# Patient Record
Sex: Female | Born: 1960
Health system: Southern US, Community
[De-identification: ages and names within clinical notes are randomized; demographics above are authoritative.]

## PROBLEM LIST (undated history)

## (undated) DIAGNOSIS — K219 Gastro-esophageal reflux disease without esophagitis: Secondary | ICD-10-CM

## (undated) DIAGNOSIS — T7840XA Allergy, unspecified, initial encounter: Secondary | ICD-10-CM

## (undated) DIAGNOSIS — G5603 Carpal tunnel syndrome, bilateral upper limbs: Secondary | ICD-10-CM

## (undated) DIAGNOSIS — E039 Hypothyroidism, unspecified: Secondary | ICD-10-CM

## (undated) DIAGNOSIS — M8580 Other specified disorders of bone density and structure, unspecified site: Secondary | ICD-10-CM

## (undated) DIAGNOSIS — M1990 Unspecified osteoarthritis, unspecified site: Secondary | ICD-10-CM

## (undated) DIAGNOSIS — E079 Disorder of thyroid, unspecified: Secondary | ICD-10-CM

## (undated) DIAGNOSIS — E7800 Pure hypercholesterolemia, unspecified: Secondary | ICD-10-CM

## (undated) HISTORY — PX: FOOT SURGERY: SHX648

## (undated) HISTORY — PX: HYSTERECTOMY ABDOMINAL WITH SALPINGECTOMY: SHX6725

## (undated) HISTORY — PX: TUBAL LIGATION: SHX77

## (undated) HISTORY — PX: NASAL SINUS SURGERY: SHX719

## (undated) HISTORY — DX: Disorder of thyroid, unspecified: E07.9

## (undated) HISTORY — PX: OTHER SURGICAL HISTORY: SHX169

## (undated) HISTORY — PX: ABDOMINAL HYSTERECTOMY: SHX81

## (undated) HISTORY — PX: JOINT REPLACEMENT: SHX530

## (undated) HISTORY — PX: CHOLECYSTECTOMY: SHX55

## (undated) HISTORY — DX: Allergy, unspecified, initial encounter: T78.40XA

## (undated) HISTORY — DX: Pure hypercholesterolemia, unspecified: E78.00

---
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
## 2009-05-11 HISTORY — PX: COLONOSCOPY: SHX174

## 2016-01-20 MED FILL — LEVOTHYROXINE 75 MCG TABLET: 75 | 90 days supply | Qty: 90 | Fill #0

## 2016-02-04 MED FILL — VENLAFAXINE HCL ER 75 MG CA: 75 | 30 days supply | Qty: 30 | Fill #0

## 2016-02-27 ENCOUNTER — Ambulatory Visit (INDEPENDENT_AMBULATORY_CARE_PROVIDER_SITE_OTHER): Payer: 59 | Admitting: Orthopaedic Surgery

## 2016-02-27 DIAGNOSIS — G5601 Carpal tunnel syndrome, right upper limb: Secondary | ICD-10-CM | POA: Diagnosis not present

## 2016-02-27 DIAGNOSIS — G5602 Carpal tunnel syndrome, left upper limb: Secondary | ICD-10-CM

## 2016-02-27 MED FILL — CELECOXIB 200 MG CAPSULE: 200 | 30 days supply | Qty: 60 | Fill #0

## 2016-02-27 MED FILL — DICLOFENAC SODIUM 1% GEL: 1 | 10 days supply | Qty: 100 | Fill #0

## 2016-03-12 ENCOUNTER — Ambulatory Visit (INDEPENDENT_AMBULATORY_CARE_PROVIDER_SITE_OTHER): Payer: 59 | Admitting: Physical Medicine and Rehabilitation

## 2016-03-12 ENCOUNTER — Encounter (INDEPENDENT_AMBULATORY_CARE_PROVIDER_SITE_OTHER): Payer: Self-pay | Admitting: Physical Medicine and Rehabilitation

## 2016-03-12 DIAGNOSIS — R200 Anesthesia of skin: Secondary | ICD-10-CM

## 2016-03-12 DIAGNOSIS — R202 Paresthesia of skin: Secondary | ICD-10-CM | POA: Diagnosis not present

## 2016-03-12 NOTE — Progress Notes (Signed)
Office Visit Note  Patient: Alisha Reed           Date of Birth: February 08, 1961           MRN: [id number] Visit Date: 03/12/2016              Requested by: No referring provider defined for this encounter. PCP: No PCP Per Patient   Assessment & Plan: Visit Diagnoses:  1. Numbness and tingling in both hands   2. Paresthesia of skin    Chronic worsening hand pain with numbness and tingling in the first 3 digits right more than left. She classically has carpal tunnel syndrome symptoms. Now electrodiagnostic study does show moderate median neuropathy at the wrist on the right as well as mild on the left. She should continue with wrist splints particularly at night. She may wish to consider carpal tunnel injection by Dr. Wyline Copas but I would leave that up to his discretion. She really does not necessarily need surgery at this point although she could consider surgery on the right and it would be definitive.  Follow-Up Instructions: Return for Follow up Dr. Erlinda Hong for review EMG/NCS.  Orders:  Orders Placed This Encounter  Procedures  . NCV with EMG (electromyography)    No orders of the defined types were placed in this encounter.     Procedures: EMG & NCV Findings: Evaluation of the right median motor nerve showed prolonged distal onset latency (4.3 ms).  The left median (across palm) sensory and the right median (across palm) sensory nerves showed prolonged distal peak latency (Wrist, L3.7, R3.9 ms).  All remaining nerves (as indicated in the following tables) were within normal limits.  All left vs. right side differences were within normal limits.    All examined muscles (as indicated in the following table) showed no evidence of electrical instability.    Impression: The above electrodiagnostic study is ABNORMAL and reveals evidence of: 1.  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   2.  A very mild left  median nerve entrapment at the wrist (carpal tunnel syndrome) affecting sensory components  There is no significant electrodiagnostic evidence of any other focal nerve entrapment, brachial plexopathy or cervical radiculopathy.   Recommendations: 1.  Follow-up with referring physician. 2.  Continue current management of symptoms. Continue Celebrex. 3.  Continue use of resting splint at night-time and as needed during the day. Consider diagnostic and therapeutic carpal tunnel injection.     Nerve Conduction Studies Anti Sensory Summary Table   Stim Site NR Peak (ms) Norm Peak (ms) P-T Amp (V) Norm P-T Amp Site1 Site2 Delta-P (ms) Dist (cm) Vel (m/s) Norm Vel (m/s)  Left Median Acr Palm Anti Sensory (2nd Digit)  32.9C  Wrist    *3.7 <3.6 24.1 >10 Wrist Palm 1.9 0.0    Palm    1.8 <2.0 31.4         Right Median Acr Palm Anti Sensory (2nd Digit)  32.6C  Wrist    *3.9 <3.6 28.6 >10 Wrist Palm 2.0 0.0    Palm    1.9 <2.0 24.9         Left Radial Anti Sensory (Base 1st Digit)  32.7C  Wrist    2.1 <3.1 21.8  Wrist Base 1st Digit 2.1 0.0    Right Radial Anti Sensory (Base 1st Digit)  33.3C  Wrist    2.0 <3.1 34.2  Wrist Base 1st Digit 2.0  0.0    Left Ulnar Anti Sensory (5th Digit)  33.1C  Wrist    3.3 <3.7 27.5 >15.0 Wrist 5th Digit 3.3 14.0 42 >38  Right Ulnar Anti Sensory (5th Digit)  33.1C  Wrist    3.3 <3.7 31.2 >15.0 Wrist 5th Digit 3.3 14.0 42 >38   Motor Summary Table   Stim Site NR Onset (ms) Norm Onset (ms) O-P Amp (mV) Norm O-P Amp Site1 Site2 Delta-0 (ms) Dist (cm) Vel (m/s) Norm Vel (m/s)  Left Median Motor (Abd Poll Brev)  32.9C  Wrist    4.1 <4.2 7.4 >5 Elbow Wrist 3.6 20.5 57 >50  Elbow    7.7  7.3         Right Median Motor (Abd Poll Brev)  33.4C  Wrist    *4.3 <4.2 6.8 >5 Elbow Wrist 3.8 19.0 50 >50  Elbow    8.1  6.9         Left Ulnar Motor (Abd Dig Min)  33.2C  Wrist    2.9 <4.2 7.3 >3 B Elbow Wrist 2.6 17.0 65 >53  B Elbow    5.5  6.9  A Elbow B Elbow  1.2 10.0 83 >53  A Elbow    6.7  6.6         Right Ulnar Motor (Abd Dig Min)  33.7C  Wrist    2.8 <4.2 8.1 >3 B Elbow Wrist 2.7 19.0 70 >53  B Elbow    5.5  7.6  A Elbow B Elbow 1.1 10.0 91 >53  A Elbow    6.6  6.4          EMG   Side Muscle Nerve Root Ins Act Fibs Psw Amp Dur Poly Recrt Int Fraser Din Comment  Right 1stDorInt Ulnar C8-T1 Nml Nml Nml Nml Nml 0 Nml Nml   Right PronatorTeres Median C6-7 Nml Nml Nml Nml Nml 0 Nml Nml   Right Biceps Musculocut C5-6 Nml Nml Nml Nml Nml 0 Nml Nml   Right Deltoid Axillary C5-6 Nml Nml Nml Nml Nml 0 Nml Nml     Nerve Conduction Studies Anti Sensory Left/Right Comparison   Stim Site L Lat (ms) R Lat (ms) L-R Lat (ms) L Amp (V) R Amp (V) L-R Amp (%) Site1 Site2 L Vel (m/s) R Vel (m/s) L-R Vel (m/s)  Median Acr Palm Anti Sensory (2nd Digit)  32.9C  Wrist *3.7 *3.9 0.2 24.1 28.6 15.7 Wrist Palm     Palm 1.8 1.9 0.1 31.4 24.9 20.7       Radial Anti Sensory (Base 1st Digit)  32.7C  Wrist 2.1 2.0 0.1 21.8 34.2 36.3 Wrist Base 1st Digit     Ulnar Anti Sensory (5th Digit)  33.1C  Wrist 3.3 3.3 0.0 27.5 31.2 11.9 Wrist 5th Digit 42 42 0   Motor Left/Right Comparison   Stim Site L Lat (ms) R Lat (ms) L-R Lat (ms) L Amp (mV) R Amp (mV) L-R Amp (%) Site1 Site2 L Vel (m/s) R Vel (m/s) L-R Vel (m/s)  Median Motor (Abd Poll Brev)  32.9C  Wrist 4.1 *4.3 0.2 7.4 6.8 8.1 Elbow Wrist 57 50 7  Elbow 7.7 8.1 0.4 7.3 6.9 5.5       Ulnar Motor (Abd Dig Min)  33.2C  Wrist 2.9 2.8 0.1 7.3 8.1 9.9 B Elbow Wrist 65 70 5  B Elbow 5.5 5.5 0.0 6.9 7.6 9.2 A Elbow B Elbow 83 91 8  A Elbow 6.7 6.6 0.1 6.6  6.4 3.0             Other Procedures: No procedures performed   Clinical Data: No additional findings.   Subjective: Chief Complaint  Patient presents with  . Left Hand - Numbness  . Right Hand - Numbness    HPI Bilateral Upper Extremity Right hand dominant No Lotions Numbness and tingling in both hands. Mostly first three fingers. Wakes her  up at night. Driving makes it worse. Works in [place] and opening supplies is difficult. Driving makes it worse. Symptoms present for about a year. Was on crutches May 2016 and symptoms got worse. Review of Systems  Constitutional: Negative for chills, fatigue, fever and unexpected weight change.  HENT: Negative for sore throat and trouble swallowing.   Eyes: Negative for photophobia and visual disturbance.  Respiratory: Negative for chest tightness and shortness of breath.   Cardiovascular: Negative for chest pain.  Gastrointestinal: Negative for abdominal pain.  Endocrine: Negative for cold intolerance and heat intolerance.  Musculoskeletal: Negative for myalgias.  Skin: Negative for color change and rash.  Neurological: Positive for numbness. Negative for speech difficulty and headaches.  Psychiatric/Behavioral: Negative for confusion. The patient is not nervous/anxious.   All other systems reviewed and are negative.    Objective: Vital Signs: There were no vitals taken for this visit.   Physical Exam  Constitutional: She is oriented to person, place, and time. She appears well-developed and well-nourished.  Eyes: Conjunctivae and EOM are normal. Pupils are equal, round, and reactive to light.  Cardiovascular: Normal rate and intact distal pulses.   Pulmonary/Chest: Effort normal.  Neurological: She is alert and oriented to person, place, and time.  Skin: Skin is warm and dry. No erythema.  Psychiatric: She has a normal mood and affect. Her behavior is normal.  Nursing note and vitals reviewed.  Inspection reveals no atrophy of the bilateral APB or FDI or hand intrinsics. There is no swelling, color changes, allodynia or dystrophic changes. There is intact sensation to light touch in all dermatomal and peripheral nerve distributions.  There is a negative Tinel's test at the bilateral wrist and elbow.   Ortho Exam  Specialty Comments:  No specialty comments available. Imaging: No  results found.   PMFS History: There are no active problems to display for this patient.  History reviewed. No pertinent past medical history.  History reviewed. No pertinent family history. Past Surgical History:  Procedure Laterality Date  . cholecystectomy    . FOOT SURGERY Bilateral   . HYSTERECTOMY ABDOMINAL WITH SALPINGECTOMY    . NASAL SINUS SURGERY     Social History   Occupational History  . Not on file.   Social History Main Topics  . Smoking status: Never Smoker  . Smokeless tobacco: Never Used  . Alcohol use Yes     Comment: Scoial  . Drug use: No  . Sexual activity: Yes    Birth control/ protection: Surgical

## 2016-03-12 NOTE — Procedures (Signed)
EMG & NCV Findings: Evaluation of the right median motor nerve showed prolonged distal onset latency (4.3 ms).  The left median (across palm) sensory and the right median (across palm) sensory nerves showed prolonged distal peak latency (Wrist, L3.7, R3.9 ms).  All remaining nerves (as indicated in the following tables) were within normal limits.  All left vs. right side differences were within normal limits.    All examined muscles (as indicated in the following table) showed no evidence of electrical instability.    Impression: The above electrodiagnostic study is ABNORMAL and reveals evidence of: 1.  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   2.  A very mild left median nerve entrapment at the wrist (carpal tunnel syndrome) affecting sensory components  There is no significant electrodiagnostic evidence of any other focal nerve entrapment, brachial plexopathy or cervical radiculopathy.   Recommendations: 1.  Follow-up with referring physician. 2.  Continue current management of symptoms. Continue Celebrex. 3.  Continue use of resting splint at night-time and as needed during the day. Consider diagnostic and therapeutic carpal tunnel injection.     Nerve Conduction Studies Anti Sensory Summary Table   Stim Site NR Peak (ms) Norm Peak (ms) P-T Amp (V) Norm P-T Amp Site1 Site2 Delta-P (ms) Dist (cm) Vel (m/s) Norm Vel (m/s)  Left Median Acr Palm Anti Sensory (2nd Digit)  32.9C  Wrist    *3.7 <3.6 24.1 >10 Wrist Palm 1.9 0.0    Palm    1.8 <2.0 31.4         Right Median Acr Palm Anti Sensory (2nd Digit)  32.6C  Wrist    *3.9 <3.6 28.6 >10 Wrist Palm 2.0 0.0    Palm    1.9 <2.0 24.9         Left Radial Anti Sensory (Base 1st Digit)  32.7C  Wrist    2.1 <3.1 21.8  Wrist Base 1st Digit 2.1 0.0    Right Radial Anti Sensory (Base 1st Digit)  33.3C  Wrist    2.0 <3.1 34.2  Wrist Base 1st Digit 2.0 0.0     Left Ulnar Anti Sensory (5th Digit)  33.1C  Wrist    3.3 <3.7 27.5 >15.0 Wrist 5th Digit 3.3 14.0 42 >38  Right Ulnar Anti Sensory (5th Digit)  33.1C  Wrist    3.3 <3.7 31.2 >15.0 Wrist 5th Digit 3.3 14.0 42 >38   Motor Summary Table   Stim Site NR Onset (ms) Norm Onset (ms) O-P Amp (mV) Norm O-P Amp Site1 Site2 Delta-0 (ms) Dist (cm) Vel (m/s) Norm Vel (m/s)  Left Median Motor (Abd Poll Brev)  32.9C  Wrist    4.1 <4.2 7.4 >5 Elbow Wrist 3.6 20.5 57 >50  Elbow    7.7  7.3         Right Median Motor (Abd Poll Brev)  33.4C  Wrist    *4.3 <4.2 6.8 >5 Elbow Wrist 3.8 19.0 50 >50  Elbow    8.1  6.9         Left Ulnar Motor (Abd Dig Min)  33.2C  Wrist    2.9 <4.2 7.3 >3 B Elbow Wrist 2.6 17.0 65 >53  B Elbow    5.5  6.9  A Elbow B Elbow 1.2 10.0 83 >53  A Elbow    6.7  6.6         Right Ulnar Motor (Abd Dig Min)  33.7C  Wrist    2.8 <4.2 8.1 >3 B Elbow Wrist 2.7 19.0 70 >53  B Elbow    5.5  7.6  A Elbow B Elbow 1.1 10.0 91 >53  A Elbow    6.6  6.4          EMG   Side Muscle Nerve Root Ins Act Fibs Psw Amp Dur Poly Recrt Int Fraser Din Comment  Right 1stDorInt Ulnar C8-T1 Nml Nml Nml Nml Nml 0 Nml Nml   Right PronatorTeres Median C6-7 Nml Nml Nml Nml Nml 0 Nml Nml   Right Biceps Musculocut C5-6 Nml Nml Nml Nml Nml 0 Nml Nml   Right Deltoid Axillary C5-6 Nml Nml Nml Nml Nml 0 Nml Nml     Nerve Conduction Studies Anti Sensory Left/Right Comparison   Stim Site L Lat (ms) R Lat (ms) L-R Lat (ms) L Amp (V) R Amp (V) L-R Amp (%) Site1 Site2 L Vel (m/s) R Vel (m/s) L-R Vel (m/s)  Median Acr Palm Anti Sensory (2nd Digit)  32.9C  Wrist *3.7 *3.9 0.2 24.1 28.6 15.7 Wrist Palm     Palm 1.8 1.9 0.1 31.4 24.9 20.7       Radial Anti Sensory (Base 1st Digit)  32.7C  Wrist 2.1 2.0 0.1 21.8 34.2 36.3 Wrist Base 1st Digit     Ulnar Anti Sensory (5th Digit)  33.1C  Wrist 3.3 3.3 0.0 27.5 31.2 11.9 Wrist 5th Digit 42 42 0   Motor Left/Right Comparison   Stim Site L Lat (ms) R Lat (ms) L-R  Lat (ms) L Amp (mV) R Amp (mV) L-R Amp (%) Site1 Site2 L Vel (m/s) R Vel (m/s) L-R Vel (m/s)  Median Motor (Abd Poll Brev)  32.9C  Wrist 4.1 *4.3 0.2 7.4 6.8 8.1 Elbow Wrist 57 50 7  Elbow 7.7 8.1 0.4 7.3 6.9 5.5       Ulnar Motor (Abd Dig Min)  33.2C  Wrist 2.9 2.8 0.1 7.3 8.1 9.9 B Elbow Wrist 65 70 5  B Elbow 5.5 5.5 0.0 6.9 7.6 9.2 A Elbow B Elbow 83 91 8  A Elbow 6.7 6.6 0.1 6.6 6.4 3.0

## 2016-03-16 ENCOUNTER — Ambulatory Visit (INDEPENDENT_AMBULATORY_CARE_PROVIDER_SITE_OTHER): Payer: 59 | Admitting: Orthopaedic Surgery

## 2016-03-16 DIAGNOSIS — G5601 Carpal tunnel syndrome, right upper limb: Secondary | ICD-10-CM | POA: Diagnosis not present

## 2016-03-16 MED ORDER — METHYLPREDNISOLONE ACETATE 40 MG/ML IJ SUSP
20.0000 mg | INTRAMUSCULAR | Status: AC | PRN
  Administered 2016-03-16: 20 mg

## 2016-03-16 MED ORDER — BUPIVACAINE HCL 0.5 % IJ SOLN
0.5000 mL | INTRAMUSCULAR | Status: AC | PRN
  Administered 2016-03-16: .5 mL

## 2016-03-16 MED ORDER — LIDOCAINE HCL 1 % IJ SOLN
0.5000 mL | INTRAMUSCULAR | Status: AC | PRN
  Administered 2016-03-16: .5 mL

## 2016-03-16 NOTE — Patient Instructions (Signed)

## 2016-03-16 NOTE — Progress Notes (Signed)
   Office Visit Note   Patient: Alisha Reed           Date of Birth: 1961/04/14           MRN: [id number] Visit Date: 03/16/2016              Requested by: No referring provider defined for this encounter. PCP: No PCP Per Patient   Assessment & Plan: Visit Diagnoses:  1. Right carpal tunnel syndrome     Plan:  - continue night time splints - right CTS injection through FCR sheath - may want to consider left injection if symptoms worsen - f/u prn  Follow-Up Instructions: Return if symptoms worsen or fail to improve.   Orders:  Orders Placed This Encounter  Procedures  . Hand/Upper Extremity Injection/Arthrocentesis   No orders of the defined types were placed in this encounter.     Procedures: Hand/UE Inj Date/Time: 03/16/2016 1:35 PM Performed by: Leandrew Koyanagi Authorized by: Leandrew Koyanagi   Consent Given by:  Patient Timeout: prior to procedure the correct patient, procedure, and site was verified   Indications:  Pain Condition: carpal tunnel   Site:  R carpal tunnel Prep: patient was prepped and draped in usual sterile fashion   Needle Size:  25 G Approach:  Volar Medications:  0.5 mL bupivacaine 0.5 %; 0.5 mL lidocaine 1 %; 20 mg methylPREDNISolone acetate 40 MG/ML      Clinical Data: No additional findings.   Subjective: Chief Complaint  Patient presents with  . Left Hand - Pain, Numbness, Follow-up  . Right Hand - Pain, Numbness, Follow-up  . Right Foot - Pain  . Left Foot - Pain    F/u visit for CTS.  Moderate CTS on right and mild on the left.  She's definitely more symptomatic on the right side.  Splints have given partial relief.      Review of Systems   Objective: Vital Signs: There were no vitals taken for this visit.  Physical Exam  Ortho Exam Exam stable Specialty Comments:  No specialty comments available.  Imaging: No results found.   PMFS History: There are no active problems to display for this patient.  No  past medical history on file.  No family history on file.  Past Surgical History:  Procedure Laterality Date  . cholecystectomy    . FOOT SURGERY Bilateral   . HYSTERECTOMY ABDOMINAL WITH SALPINGECTOMY    . NASAL SINUS SURGERY     Social History   Occupational History  . Not on file.   Social History Main Topics  . Smoking status: Never Smoker  . Smokeless tobacco: Never Used  . Alcohol use Yes     Comment: Scoial  . Drug use: No  . Sexual activity: Yes    Birth control/ protection: Surgical

## 2016-03-31 MED FILL — VENLAFAXINE HCL ER 75 MG CA: 75 | 30 days supply | Qty: 30 | Fill #1

## 2016-04-20 MED FILL — LEVOTHYROXINE 75 MCG TABLET: 75 | 90 days supply | Qty: 90 | Fill #1

## 2016-04-26 MED FILL — VENLAFAXINE HCL ER 75 MG CA: 75 | 30 days supply | Qty: 30 | Fill #2

## 2016-04-26 MED FILL — CELECOXIB 200 MG CAPSULE: 200 | 30 days supply | Qty: 60 | Fill #1

## 2016-06-12 MED FILL — VENLAFAXINE HCL ER 75 MG CA: 75 | 30 days supply | Qty: 30 | Fill #3

## 2016-06-26 MED FILL — CELECOXIB 200 MG CAPSULE: 200 | 30 days supply | Qty: 60 | Fill #2

## 2016-07-15 MED FILL — LEVOTHYROXINE 75 MCG TABLET: 75 | 90 days supply | Qty: 90 | Fill #2

## 2016-07-15 MED FILL — VENLAFAXINE HCL ER 75 MG CA: 75 | 30 days supply | Qty: 30 | Fill #4

## 2016-08-18 MED FILL — VENLAFAXINE HCL ER 75 MG CA: 75 | 30 days supply | Qty: 30 | Fill #5

## 2016-09-10 ENCOUNTER — Encounter (INDEPENDENT_AMBULATORY_CARE_PROVIDER_SITE_OTHER): Payer: Self-pay | Admitting: Orthopaedic Surgery

## 2016-09-10 ENCOUNTER — Ambulatory Visit (INDEPENDENT_AMBULATORY_CARE_PROVIDER_SITE_OTHER): Payer: 59 | Admitting: Orthopaedic Surgery

## 2016-09-10 DIAGNOSIS — G5601 Carpal tunnel syndrome, right upper limb: Secondary | ICD-10-CM | POA: Diagnosis not present

## 2016-09-10 MED ORDER — METHYLPREDNISOLONE ACETATE 40 MG/ML IJ SUSP
20.0000 mg | INTRAMUSCULAR | Status: AC | PRN
  Administered 2016-09-10: 20 mg

## 2016-09-10 MED ORDER — LIDOCAINE HCL 1 % IJ SOLN
0.5000 mL | INTRAMUSCULAR | Status: AC | PRN
  Administered 2016-09-10: .5 mL

## 2016-09-10 MED ORDER — BUPIVACAINE HCL 0.5 % IJ SOLN
0.5000 mL | INTRAMUSCULAR | Status: AC | PRN
  Administered 2016-09-10: .5 mL

## 2016-09-10 MED ORDER — CELECOXIB 100 MG PO CAPS: 200.0000 mg | ORAL_CAPSULE | Freq: Two times a day (BID) | ORAL | 3 refills | Status: DC | PRN

## 2016-09-10 NOTE — Progress Notes (Signed)
   Office Visit Note   Patient: Alisha Reed           Date of Birth: [birth year]           MRN: [id number] Visit Date: 09/10/2016              Requested by: No referring provider defined for this encounter. PCP: No PCP Per Patient   Assessment & Plan: Visit Diagnoses:  1. Right carpal tunnel syndrome     Plan: Right carpal tunnel injection was performed today. Celebrex was refilled. She will let us know what it's time for surgery. We will get her husband through hip replacement first.  Follow-Up Instructions: Return if symptoms worsen or fail to improve.   Orders:  Orders Placed This Encounter  Procedures  . Hand/Upper Extremity Injection/Arthrocentesis   Meds ordered this encounter  Medications  . celecoxib (CELEBREX) 100 MG capsule    Sig: Take 2 capsules (200 mg total) by mouth 2 (two) times daily as needed.    Dispense:  60 capsule    Refill:  3      Procedures: Hand/UE Inj Date/Time: 09/10/2016 7:43 PM Performed by: Leandrew Koyanagi Authorized by: Leandrew Koyanagi   Consent Given by:  Patient Timeout: prior to procedure the correct patient, procedure, and site was verified   Indications:  Pain Condition: carpal tunnel   Site:  R carpal tunnel Prep: patient was prepped and draped in usual sterile fashion   Needle Size:  25 G Approach:  Volar Medications:  0.5 mL lidocaine 1 %; 0.5 mL bupivacaine 0.5 %; 20 mg methylPREDNISolone acetate 40 MG/ML     Clinical Data: No additional findings.   Subjective: Chief Complaint  Patient presents with  . Left Wrist - Pain, Numbness  . Right Wrist - Pain, Numbness    Alisha Reed comes in today for her right carpal tunnel syndrome. She had a previous injection which did help for several months. She is currently wearing night splints. She been taking Celebrex also. She is also requesting another injection.    Review of Systems   Objective: Vital Signs: There were no vitals taken for this visit.  Physical Exam  Ortho  Exam Right hand exam is stable. Specialty Comments:  No specialty comments available.  Imaging: No results found.   PMFS History: There are no active problems to display for this patient.  No past medical history on file.  No family history on file.  Past Surgical History:  Procedure Laterality Date  . cholecystectomy    . FOOT SURGERY Bilateral   . HYSTERECTOMY ABDOMINAL WITH SALPINGECTOMY    . NASAL SINUS SURGERY     Social History   Occupational History  . Not on file.   Social History Main Topics  . Smoking status: Never Smoker  . Smokeless tobacco: Never Used  . Alcohol use Yes     Comment: Scoial  . Drug use: No  . Sexual activity: Yes    Birth control/ protection: Surgical

## 2016-09-11 MED FILL — CELECOXIB 100 MG CAP: 100 | 15 days supply | Qty: 60 | Fill #0

## 2016-09-22 MED FILL — VENLAFAXINE HCL ER 75 MG CA: 75 | 30 days supply | Qty: 30 | Fill #0

## 2016-09-24 ENCOUNTER — Ambulatory Visit (INDEPENDENT_AMBULATORY_CARE_PROVIDER_SITE_OTHER): Payer: 59 | Admitting: Family

## 2016-09-24 ENCOUNTER — Encounter: Payer: Self-pay | Admitting: Family

## 2016-09-24 VITALS — BP 124/78 | HR 85 | Temp 98.6°F | Resp 16 | Ht 63.0 in | Wt 163.4 lb

## 2016-09-24 DIAGNOSIS — Z7289 Other problems related to lifestyle: Secondary | ICD-10-CM

## 2016-09-24 DIAGNOSIS — E039 Hypothyroidism, unspecified: Secondary | ICD-10-CM | POA: Insufficient documentation

## 2016-09-24 DIAGNOSIS — Z1211 Encounter for screening for malignant neoplasm of colon: Secondary | ICD-10-CM

## 2016-09-24 DIAGNOSIS — Z0000 Encounter for general adult medical examination without abnormal findings: Secondary | ICD-10-CM

## 2016-09-24 DIAGNOSIS — Z0001 Encounter for general adult medical examination with abnormal findings: Secondary | ICD-10-CM | POA: Insufficient documentation

## 2016-09-24 DIAGNOSIS — Z1231 Encounter for screening mammogram for malignant neoplasm of breast: Secondary | ICD-10-CM

## 2016-09-24 MED ORDER — VENLAFAXINE HCL ER 75 MG PO CP24: 75.0000 mg | ORAL_CAPSULE | Freq: Every day | ORAL | 1 refills | Status: DC

## 2016-09-24 NOTE — Assessment & Plan Note (Addendum)
1) Anticipatory Guidance: Discussed importance of wearing a seatbelt while driving and not texting while driving; changing batteries in smoke detector at least once annually; wearing suntan lotion when outside; eating a balanced and moderate diet; getting physical activity at least 30 minutes per day.  2) Immunizations / Screenings / Labs:  Declines tetanus. All other immunizations are up-to-date per recommendations. Due for a breast cancer screening with order for mammogram placed. Not a candidate for cervical cancer screening given previous hysterectomy. Obtain hepatitis C antibody for hepatitis C screening. Obtain vitamin D for vitamin D deficiency screening. Currently maintained on levothyroxine for hypothyroid with TSH ordered. All other screenings are up-to-date per recommendations. Obtain CBC, CMET, and lipid profile.    Overall well exam with risk factors for cardiovascular disease including overweight. Recommend weight loss of 5-10 pounds of current body weight through nutrition and physical activity to improve overall health. Increase physical activity to 30 minutes of moderate level activity daily or approximately 10,000 steps per day. Continue a nutritional intake that is moderate, balance, and varied. Continue other healthy lifestyle behaviors and choices. Follow-up prevention exam in 1 year. Follow-up office visit pending blood work.

## 2016-09-24 NOTE — Progress Notes (Signed)
Subjective:    Patient ID: Alisha Reed, female    DOB: 04-29-1961, 56 y.o.   MRN: [id number]  Chief Complaint  Patient presents with  . Establish Care    CPE, not fasting, shingrix?    HPI:  Alisha Reed is a 56 y.o. female who presents today for an annual wellness visit.   1) Health Maintenance -   Diet - Averages about 3 meals per day consisting of a regular diet; Caffeine 4-5 cups per day  Exercise - No structured exercise   2) Preventative Exams / Immunizations:  Dental -- Up to date  Vision -- Up to date    Health Maintenance  Topic Date Due  . Hepatitis C Screening  1961-05-10  . HIV Screening  04/16/1974  . TETANUS/TDAP  04/16/1978  . PAP SMEAR  04/16/1980  . MAMMOGRAM  04/16/2009  . INFLUENZA VACCINE  12/09/2016  . COLONOSCOPY  08/09/2021     There is no immunization history on file for this patient.   No Known Allergies   Outpatient Medications Prior to Visit  Medication Sig Dispense Refill  . celecoxib (CELEBREX) 100 MG capsule Take 2 capsules (200 mg total) by mouth 2 (two) times daily as needed. 60 capsule 3  . levothyroxine (SYNTHROID, LEVOTHROID) 75 MCG tablet Take 75 mcg by mouth daily before breakfast.    . venlafaxine XR (EFFEXOR-XR) 75 MG 24 hr capsule Take 75 mg by mouth daily.  12  . meloxicam (MOBIC) 15 MG tablet Take 15 mg by mouth daily.  1   No facility-administered medications prior to visit.      Past Medical History:  Diagnosis Date  . Thyroid disease      Past Surgical History:  Procedure Laterality Date  . cholecystectomy    . FOOT SURGERY Bilateral   . HYSTERECTOMY ABDOMINAL WITH SALPINGECTOMY    . NASAL SINUS SURGERY       Family History  Problem Relation Age of Onset  . Scleroderma Mother   . Rheum arthritis Mother   . Heart disease Mother   . Heart disease Father   . Glaucoma Father   . Stomach cancer Maternal Grandmother   . Heart attack Maternal Grandfather   . Alzheimer's disease Paternal  Grandmother   . Throat cancer Paternal Grandfather      Social History   Social History  . Marital status: Married    Spouse name: N/A  . Number of children: 2  . Years of education: 16   Occupational History  . RN    Social History Main Topics  . Smoking status: Never Smoker  . Smokeless tobacco: Never Used  . Alcohol use Yes     Comment: Scoial  . Drug use: No  . Sexual activity: Yes    Birth control/ protection: Surgical   Other Topics Concern  . Not on file   Social History Narrative   Fun/Hobby - Concerts, camping, grandchildren      Review of Systems  Constitutional: Denies fever, chills, fatigue, or significant weight gain/loss. HENT: Head: Denies headache or neck pain Ears: Denies changes in hearing, ringing in ears, earache, drainage Nose: Denies discharge, stuffiness, itching, nosebleed, sinus pain Throat: Denies sore throat, hoarseness, dry mouth, sores, thrush Eyes: Denies loss/changes in vision, pain, redness, blurry/double vision, flashing lights Cardiovascular: Denies chest pain/discomfort, tightness, palpitations, shortness of breath with activity, difficulty lying down, swelling, sudden awakening with shortness of breath Respiratory: Denies shortness of breath, cough, sputum production, wheezing Gastrointestinal: Denies dysphasia,  heartburn, change in appetite, nausea, change in bowel habits, rectal bleeding, constipation, diarrhea, yellow skin or eyes Genitourinary: Denies frequency, urgency, burning/pain, blood in urine, incontinence, change in urinary strength. Musculoskeletal: Denies muscle/joint pain, stiffness, back pain, redness or swelling of joints, trauma Skin: Denies rashes, lumps, itching, dryness, color changes, or hair/nail changes Neurological: Denies dizziness, fainting, seizures, weakness, numbness, tingling, tremor Psychiatric - Denies nervousness, stress, depression or memory loss Endocrine: Denies heat or cold intolerance,  sweating, frequent urination, excessive thirst, changes in appetite Hematologic: Denies ease of bruising or bleeding     Objective:     BP 124/78 (BP Location: Left Arm, Patient Position: Sitting, Cuff Size: Large)   Pulse 85   Temp 98.6 F (37 C) (Oral)   Resp 16   Ht 5\' 3"  (1.6 m)   Wt 163 lb 6.4 oz (74.1 kg)   SpO2 96%   BMI 28.95 kg/m  Nursing note and vital signs reviewed.  Physical Exam  Constitutional: She is oriented to person, place, and time. She appears well-developed and well-nourished.  HENT:  Head: Normocephalic.  Right Ear: Hearing, tympanic membrane, external ear and ear canal normal.  Left Ear: Hearing, tympanic membrane, external ear and ear canal normal.  Nose: Nose normal.  Mouth/Throat: Uvula is midline, oropharynx is clear and moist and mucous membranes are normal.  Eyes: Conjunctivae and EOM are normal. Pupils are equal, round, and reactive to light.  Neck: Neck supple. No JVD present. No tracheal deviation present. No thyromegaly present.  Cardiovascular: Normal rate, regular rhythm, normal heart sounds and intact distal pulses.   Pulmonary/Chest: Effort normal and breath sounds normal.  Abdominal: Soft. Bowel sounds are normal. She exhibits no distension and no mass. There is no tenderness. There is no rebound and no guarding.  Musculoskeletal: Normal range of motion. She exhibits no edema or tenderness.  Lymphadenopathy:    She has no cervical adenopathy.  Neurological: She is alert and oriented to person, place, and time. She has normal reflexes. No cranial nerve deficit. She exhibits normal muscle tone. Coordination normal.  Skin: Skin is warm and dry.  Psychiatric: She has a normal mood and affect. Her behavior is normal. Judgment and thought content normal.       Assessment & Plan:   Problem List Items Addressed This Visit      Endocrine   Hypothyroidism    Obtain TSH. Continue current dosage of levothyroxine pending TSH results.         Other   Routine adult health maintenance - Primary    1) Anticipatory Guidance: Discussed importance of wearing a seatbelt while driving and not texting while driving; changing batteries in smoke detector at least once annually; wearing suntan lotion when outside; eating a balanced and moderate diet; getting physical activity at least 30 minutes per day.  2) Immunizations / Screenings / Labs:  Declines tetanus. All other immunizations are up-to-date per recommendations. Due for a breast cancer screening with order for mammogram placed. Not a candidate for cervical cancer screening given previous hysterectomy. Obtain hepatitis C antibody for hepatitis C screening. Obtain vitamin D for vitamin D deficiency screening. Currently maintained on levothyroxine for hypothyroid with TSH ordered. All other screenings are up-to-date per recommendations. Obtain CBC, CMET, and lipid profile.    Overall well exam with risk factors for cardiovascular disease including overweight. Recommend weight loss of 5-10 pounds of current body weight through nutrition and physical activity to improve overall health. Increase physical activity to 30 minutes of moderate  level activity daily or approximately 10,000 steps per day. Continue a nutritional intake that is moderate, balance, and varied. Continue other healthy lifestyle behaviors and choices. Follow-up prevention exam in 1 year. Follow-up office visit pending blood work.       Relevant Orders   CBC   Comprehensive metabolic panel   Lipid panel   TSH   VITAMIN D 25 Hydroxy (Vit-D Deficiency, Fractures)    Other Visit Diagnoses    Colon cancer screening       Encounter for screening mammogram for breast cancer       Relevant Orders   MM DIGITAL SCREENING BILATERAL   Other problems related to lifestyle       Relevant Orders   Hepatitis C Antibody       I have discontinued Ms. Casella's meloxicam. I have also changed her venlafaxine XR. Additionally, I am having  her maintain her levothyroxine and celecoxib.   Meds ordered this encounter  Medications  . venlafaxine XR (EFFEXOR-XR) 75 MG 24 hr capsule    Sig: Take 1 capsule (75 mg total) by mouth daily.    Dispense:  90 capsule    Refill:  1    Order Specific Question:   Supervising Provider    Answer:   Pricilla Holm A [[id number]]     Follow-up: Return in about 6 months (around 03/27/2017), or if symptoms worsen or fail to improve.   Mauricio Po, FNP

## 2016-09-24 NOTE — Assessment & Plan Note (Signed)
Obtain TSH. Continue current dosage of levothyroxine pending TSH results.

## 2016-09-24 NOTE — Patient Instructions (Signed)
Thank you for choosing [facility].  SUMMARY AND INSTRUCTIONS:  Please continue to take your medication as prescribed.  Complete your blood work at [facility].  Medication:  Your prescription(s) have been submitted to your pharmacy or been printed and provided for you. Please take as directed and contact our office if you believe you are having problem(s) with the medication(s) or have any questions.  Labs:  Please stop by the lab on the lower level of the building for your blood work. Your results will be released to [place] (or called to you) after review, usually within 72 hours after test completion. If any changes need to be made, you will be notified at that same time.  1.) The lab is open from 7:30am to 5:30 pm Monday-Friday 2.) No appointment is necessary 3.) Fasting (if needed) is 6-8 hours after food and drink; black coffee and water are okay   Follow up:  If your symptoms worsen or fail to improve, please contact our office for further instruction, or in case of emergency go directly to the emergency room at the closest medical facility.    Health Maintenance, Female Adopting a healthy lifestyle and getting preventive care can go a long way to promote health and wellness. Talk with your health care provider about what schedule of regular examinations is right for you. This is a good chance for you to check in with your provider about disease prevention and staying healthy. In between checkups, there are plenty of things you can do on your own. Experts have done a lot of research about which lifestyle changes and preventive measures are most likely to keep you healthy. Ask your health care provider for more information. Weight and diet Eat a healthy diet  Be sure to include plenty of vegetables, fruits, low-fat dairy products, and lean protein.  Do not eat a lot of foods high in solid fats, added sugars, or salt.  Get regular exercise. This is one of the most  important things you can do for your health.  Most adults should exercise for at least 150 minutes each week. The exercise should increase your heart rate and make you sweat (moderate-intensity exercise).  Most adults should also do strengthening exercises at least twice a week. This is in addition to the moderate-intensity exercise. Maintain a healthy weight  Body mass index (BMI) is a measurement that can be used to identify possible weight problems. It estimates body fat based on height and weight. Your health care provider can help determine your BMI and help you achieve or maintain a healthy weight.  For females 82 years of age and older:  A BMI below 18.5 is considered underweight.  A BMI of 18.5 to 24.9 is normal.  A BMI of 25 to 29.9 is considered overweight.  A BMI of 30 and above is considered obese. Watch levels of cholesterol and blood lipids  You should start having your blood tested for lipids and cholesterol at 58 years of age, then have this test every 5 years.  You may need to have your cholesterol levels checked more often if:  Your lipid or cholesterol levels are high.  You are older than 58 years of age.  You are at high risk for heart disease. Cancer screening Lung Cancer  Lung cancer screening is recommended for adults 40-40 years old who are at high risk for lung cancer because of a history of smoking.  A yearly low-dose CT scan of the lungs is recommended  for people who:  Currently smoke.  Have quit within the past 15 years.  Have at least a 30-pack-year history of smoking. A pack year is smoking an average of one pack of cigarettes a day for 1 year.  Yearly screening should continue until it has been 15 years since you quit.  Yearly screening should stop if you develop a health problem that would prevent you from having lung cancer treatment. Breast Cancer  Practice breast self-awareness. This means understanding how your breasts normally appear  and feel.  It also means doing regular breast self-exams. Let your health care provider know about any changes, no matter how small.  If you are in your 20s or 30s, you should have a clinical breast exam (CBE) by a health care provider every 1-3 years as part of a regular health exam.  If you are 78 or older, have a CBE every year. Also consider having a breast X-ray (mammogram) every year.  If you have a family history of breast cancer, talk to your health care provider about genetic screening.  If you are at high risk for breast cancer, talk to your health care provider about having an MRI and a mammogram every year.  Breast cancer gene (BRCA) assessment is recommended for women who have family members with BRCA-related cancers. BRCA-related cancers include:  Breast.  Ovarian.  Tubal.  Peritoneal cancers.  Results of the assessment will determine the need for genetic counseling and BRCA1 and BRCA2 testing. Cervical Cancer  Your health care provider may recommend that you be screened regularly for cancer of the pelvic organs (ovaries, uterus, and vagina). This screening involves a pelvic examination, including checking for microscopic changes to the surface of your cervix (Pap test). You may be encouraged to have this screening done every 3 years, beginning at age 58.  For women ages 29-65, health care providers may recommend pelvic exams and Pap testing every 3 years, or they may recommend the Pap and pelvic exam, combined with testing for human papilloma virus (HPV), every 5 years. Some types of HPV increase your risk of cervical cancer. Testing for HPV may also be done on women of any age with unclear Pap test results.  Other health care providers may not recommend any screening for nonpregnant women who are considered low risk for pelvic cancer and who do not have symptoms. Ask your health care provider if a screening pelvic exam is right for you.  If you have had past treatment  for cervical cancer or a condition that could lead to cancer, you need Pap tests and screening for cancer for at least 20 years after your treatment. If Pap tests have been discontinued, your risk factors (such as having a new sexual partner) need to be reassessed to determine if screening should resume. Some women have medical problems that increase the chance of getting cervical cancer. In these cases, your health care provider may recommend more frequent screening and Pap tests. Colorectal Cancer  This type of cancer can be detected and often prevented.  Routine colorectal cancer screening usually begins at 58 years of age and continues through 58 years of age.  Your health care provider may recommend screening at an earlier age if you have risk factors for colon cancer.  Your health care provider may also recommend using home test kits to check for hidden blood in the stool.  A small camera at the end of a tube can be used to examine your colon directly (sigmoidoscopy  or colonoscopy). This is done to check for the earliest forms of colorectal cancer.  Routine screening usually begins at age 34.  Direct examination of the colon should be repeated every 5-10 years through 58 years of age. However, you may need to be screened more often if early forms of precancerous polyps or small growths are found. Skin Cancer  Check your skin from head to toe regularly.  Tell your health care provider about any new moles or changes in moles, especially if there is a change in a mole's shape or color.  Also tell your health care provider if you have a mole that is larger than the size of a pencil eraser.  Always use sunscreen. Apply sunscreen liberally and repeatedly throughout the day.  Protect yourself by wearing long sleeves, pants, a wide-brimmed hat, and sunglasses whenever you are outside. Heart disease, diabetes, and high blood pressure  High blood pressure causes heart disease and increases  the risk of stroke. High blood pressure is more likely to develop in:  People who have blood pressure in the high end of the normal range (130-139/85-89 mm Hg).  People who are overweight or obese.  People who are African American.  If you are 64-37 years of age, have your blood pressure checked every 3-5 years. If you are 73 years of age or older, have your blood pressure checked every year. You should have your blood pressure measured twice-once when you are at a hospital or clinic, and once when you are not at a hospital or clinic. Record the average of the two measurements. To check your blood pressure when you are not at a hospital or clinic, you can use:  An automated blood pressure machine at a pharmacy.  A home blood pressure monitor.  If you are between 24 years and 17 years old, ask your health care provider if you should take aspirin to prevent strokes.  Have regular diabetes screenings. This involves taking a blood sample to check your fasting blood sugar level.  If you are at a normal weight and have a low risk for diabetes, have this test once every three years after 58 years of age.  If you are overweight and have a high risk for diabetes, consider being tested at a younger age or more often. Preventing infection Hepatitis B  If you have a higher risk for hepatitis B, you should be screened for this virus. You are considered at high risk for hepatitis B if:  You were born in a country where hepatitis B is common. Ask your health care provider which countries are considered high risk.  Your parents were born in a high-risk country, and you have not been immunized against hepatitis B (hepatitis B vaccine).  You have HIV or AIDS.  You use needles to inject street drugs.  You live with someone who has hepatitis B.  You have had sex with someone who has hepatitis B.  You get hemodialysis treatment.  You take certain medicines for conditions, including cancer, organ  transplantation, and autoimmune conditions. Hepatitis C  Blood testing is recommended for:  Everyone born from [birth year] through 1967.  Anyone with known risk factors for hepatitis C. Sexually transmitted infections (STIs)  You should be screened for sexually transmitted infections (STIs) including gonorrhea and chlamydia if:  You are sexually active and are younger than 58 years of age.  You are older than 58 years of age and your health care provider tells you that you are  at risk for this type of infection.  Your sexual activity has changed since you were last screened and you are at an increased risk for chlamydia or gonorrhea. Ask your health care provider if you are at risk.  If you do not have HIV, but are at risk, it may be recommended that you take a prescription medicine daily to prevent HIV infection. This is called pre-exposure prophylaxis (PrEP). You are considered at risk if:  You are sexually active and do not regularly use condoms or know the HIV status of your partner(s).  You take drugs by injection.  You are sexually active with a partner who has HIV. Talk with your health care provider about whether you are at high risk of being infected with HIV. If you choose to begin PrEP, you should first be tested for HIV. You should then be tested every 3 months for as long as you are taking PrEP. Pregnancy  If you are premenopausal and you may become pregnant, ask your health care provider about preconception counseling.  If you may become pregnant, take 400 to 800 micrograms (mcg) of folic acid every day.  If you want to prevent pregnancy, talk to your health care provider about birth control (contraception). Osteoporosis and menopause  Osteoporosis is a disease in which the bones lose minerals and strength with aging. This can result in serious bone fractures. Your risk for osteoporosis can be identified using a bone density scan.  If you are 80 years of age or older, or  if you are at risk for osteoporosis and fractures, ask your health care provider if you should be screened.  Ask your health care provider whether you should take a calcium or vitamin D supplement to lower your risk for osteoporosis.  Menopause may have certain physical symptoms and risks.  Hormone replacement therapy may reduce some of these symptoms and risks. Talk to your health care provider about whether hormone replacement therapy is right for you. Follow these instructions at home:  Schedule regular health, dental, and eye exams.  Stay current with your immunizations.  Do not use any tobacco products including cigarettes, chewing tobacco, or electronic cigarettes.  If you are pregnant, do not drink alcohol.  If you are breastfeeding, limit how much and how often you drink alcohol.  Limit alcohol intake to no more than 1 drink per day for nonpregnant women. One drink equals 12 ounces of beer, 5 ounces of wine, or 1 ounces of hard liquor.  Do not use street drugs.  Do not share needles.  Ask your health care provider for help if you need support or information about quitting drugs.  Tell your health care provider if you often feel depressed.  Tell your health care provider if you have ever been abused or do not feel safe at home. This information is not intended to replace advice given to you by your health care provider. Make sure you discuss any questions you have with your health care provider. Document Released: 11/10/2010 Document Revised: 10/03/2015 Document Reviewed: 01/29/2015 Elsevier Interactive Patient Education  2017 [employer].

## 2016-09-30 ENCOUNTER — Other Ambulatory Visit (INDEPENDENT_AMBULATORY_CARE_PROVIDER_SITE_OTHER): Payer: 59

## 2016-09-30 DIAGNOSIS — Z7289 Other problems related to lifestyle: Secondary | ICD-10-CM | POA: Diagnosis not present

## 2016-09-30 DIAGNOSIS — Z0000 Encounter for general adult medical examination without abnormal findings: Secondary | ICD-10-CM

## 2016-09-30 LAB — COMPREHENSIVE METABOLIC PANEL
ALT: 14 U/L (ref 0–35)
AST: 15 U/L (ref 0–37)
Albumin: 4.3 g/dL (ref 3.5–5.2)
Alkaline Phosphatase: 71 U/L (ref 39–117)
BUN: 18 mg/dL (ref 6–23)
CO2: 28 mEq/L (ref 19–32)
Calcium: 10.2 mg/dL (ref 8.4–10.5)
Chloride: 103 mEq/L (ref 96–112)
Creatinine, Ser: 0.83 mg/dL (ref 0.40–1.20)
GFR: 75.19 mL/min (ref >60.00)
Glucose, Bld: 104 mg/dL — ABNORMAL HIGH (ref 70–99)
Potassium: 5.2 mEq/L — ABNORMAL HIGH (ref 3.5–5.1)
Sodium: 140 mEq/L (ref 135–145)
Total Bilirubin: 0.3 mg/dL (ref 0.2–1.2)
Total Protein: 7.4 g/dL (ref 6.0–8.3)

## 2016-09-30 LAB — CBC
HCT: 41.4 % (ref 36.0–46.0)
Hemoglobin: 13.8 g/dL (ref 12.0–15.0)
MCHC: 33.3 g/dL (ref 30.0–36.0)
MCV: 89.6 fl (ref 78.0–100.0)
Platelets: 372 10*3/uL (ref 150.0–400.0)
RBC: 4.62 Mil/uL (ref 3.87–5.11)
RDW: 14.7 % (ref 11.5–15.5)
WBC: 6.8 10*3/uL (ref 4.0–10.5)

## 2016-09-30 LAB — VITAMIN D 25 HYDROXY (VIT D DEFICIENCY, FRACTURES): VITD: 16.03 ng/mL — ABNORMAL LOW (ref 30.00–100.00)

## 2016-09-30 LAB — TSH: TSH: 0.36 u[IU]/mL (ref 0.35–4.50)

## 2016-09-30 LAB — LIPID PANEL
Cholesterol: 237 mg/dL — ABNORMAL HIGH (ref 0–200)
HDL: 59.8 mg/dL (ref >39.00)
LDL Cholesterol: 158 mg/dL — ABNORMAL HIGH (ref 0–99)
NonHDL: 176.92
Total CHOL/HDL Ratio: 4
Triglycerides: 97 mg/dL (ref 0.0–149.0)
VLDL: 19.4 mg/dL (ref 0.0–40.0)

## 2016-10-01 ENCOUNTER — Encounter: Payer: Self-pay | Admitting: Family

## 2016-10-01 LAB — HEPATITIS C ANTIBODY: HCV Ab: NEGATIVE

## 2016-10-14 ENCOUNTER — Encounter: Payer: Self-pay | Admitting: Family

## 2016-10-14 MED ORDER — VENLAFAXINE HCL ER 75 MG PO CP24: 75.0000 mg | ORAL_CAPSULE | Freq: Every day | ORAL | 1 refills | Status: DC

## 2016-10-14 MED ORDER — LEVOTHYROXINE SODIUM 75 MCG PO TABS: 75.0000 ug | ORAL_TABLET | Freq: Every day | ORAL | 1 refills | Status: DC

## 2016-10-14 MED FILL — LEVOTHYROXINE 75 MCG TABLET: 75 | 90 days supply | Qty: 90 | Fill #0

## 2016-10-16 MED FILL — VENLAFAXINE HCL ER 75 MG CA: 75 | 90 days supply | Qty: 90 | Fill #0

## 2016-11-13 MED FILL — CELECOXIB 100 MG CAP: 100 | 15 days supply | Qty: 60 | Fill #1

## 2016-12-30 MED FILL — LEVOTHYROXINE 75 MCG TABLET: 75 | 90 days supply | Qty: 90 | Fill #1

## 2016-12-30 MED FILL — CELECOXIB 100 MG CAP: 100 | 15 days supply | Qty: 60 | Fill #2

## 2016-12-31 DIAGNOSIS — N951 Menopausal and female climacteric states: Secondary | ICD-10-CM | POA: Diagnosis not present

## 2016-12-31 DIAGNOSIS — Z789 Other specified health status: Secondary | ICD-10-CM | POA: Diagnosis not present

## 2016-12-31 DIAGNOSIS — Z01419 Encounter for gynecological examination (general) (routine) without abnormal findings: Secondary | ICD-10-CM | POA: Diagnosis not present

## 2016-12-31 DIAGNOSIS — Z1231 Encounter for screening mammogram for malignant neoplasm of breast: Secondary | ICD-10-CM | POA: Diagnosis not present

## 2016-12-31 MED FILL — VENLAFAXINE HCL ER 150 MG C: 150 | 30 days supply | Qty: 30 | Fill #0

## 2017-01-17 DIAGNOSIS — M25512 Pain in left shoulder: Secondary | ICD-10-CM | POA: Diagnosis not present

## 2017-01-17 DIAGNOSIS — S6992XA Unspecified injury of left wrist, hand and finger(s), initial encounter: Secondary | ICD-10-CM | POA: Diagnosis not present

## 2017-01-17 DIAGNOSIS — S6991XA Unspecified injury of right wrist, hand and finger(s), initial encounter: Secondary | ICD-10-CM | POA: Diagnosis not present

## 2017-01-17 DIAGNOSIS — S42202A Unspecified fracture of upper end of left humerus, initial encounter for closed fracture: Secondary | ICD-10-CM | POA: Diagnosis not present

## 2017-01-17 DIAGNOSIS — S63502A Unspecified sprain of left wrist, initial encounter: Secondary | ICD-10-CM | POA: Diagnosis not present

## 2017-01-17 DIAGNOSIS — M25532 Pain in left wrist: Secondary | ICD-10-CM | POA: Diagnosis not present

## 2017-01-17 DIAGNOSIS — S42292A Other displaced fracture of upper end of left humerus, initial encounter for closed fracture: Secondary | ICD-10-CM | POA: Diagnosis not present

## 2017-01-17 DIAGNOSIS — S63602A Unspecified sprain of left thumb, initial encounter: Secondary | ICD-10-CM | POA: Diagnosis not present

## 2017-01-18 ENCOUNTER — Ambulatory Visit (INDEPENDENT_AMBULATORY_CARE_PROVIDER_SITE_OTHER): Payer: 59

## 2017-01-18 ENCOUNTER — Encounter (INDEPENDENT_AMBULATORY_CARE_PROVIDER_SITE_OTHER): Payer: Self-pay | Admitting: Orthopaedic Surgery

## 2017-01-18 ENCOUNTER — Telehealth (INDEPENDENT_AMBULATORY_CARE_PROVIDER_SITE_OTHER): Payer: Self-pay | Admitting: *Deleted

## 2017-01-18 ENCOUNTER — Ambulatory Visit (INDEPENDENT_AMBULATORY_CARE_PROVIDER_SITE_OTHER): Payer: 59 | Admitting: Orthopaedic Surgery

## 2017-01-18 DIAGNOSIS — M79644 Pain in right finger(s): Secondary | ICD-10-CM

## 2017-01-18 DIAGNOSIS — S42255A Nondisplaced fracture of greater tuberosity of left humerus, initial encounter for closed fracture: Secondary | ICD-10-CM

## 2017-01-18 MED ORDER — OXYCODONE-ACETAMINOPHEN 5-325 MG PO TABS: 1.0000 | ORAL_TABLET | ORAL | 0 refills | Status: DC | PRN

## 2017-01-18 MED ORDER — METHOCARBAMOL 750 MG PO TABS: 750.0000 mg | ORAL_TABLET | Freq: Two times a day (BID) | ORAL | 0 refills | Status: DC | PRN
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
## 2017-01-18 NOTE — Telephone Encounter (Signed)
Pt has scheduled tomorrow Sept 11 at 4:00pm with arrival at 3:30pm at [facility], left message on mobile vm to return my call for appt information, tried calling home number has been disconnected.

## 2017-01-19 ENCOUNTER — Ambulatory Visit (HOSPITAL_COMMUNITY)
Admission: RE | Admit: 2017-01-19 | Discharge: 2017-01-19 | Disposition: A | Discharge status: Home / Self Care | Payer: 59 | Source: Ambulatory Visit | Attending: Orthopaedic Surgery | Admitting: Orthopaedic Surgery

## 2017-01-19 ENCOUNTER — Other Ambulatory Visit (INDEPENDENT_AMBULATORY_CARE_PROVIDER_SITE_OTHER): Payer: Self-pay | Admitting: Orthopaedic Surgery

## 2017-01-19 DIAGNOSIS — M79644 Pain in right finger(s): Secondary | ICD-10-CM | POA: Insufficient documentation

## 2017-01-19 DIAGNOSIS — S6991XA Unspecified injury of right wrist, hand and finger(s), initial encounter: Secondary | ICD-10-CM | POA: Diagnosis not present

## 2017-01-25 ENCOUNTER — Other Ambulatory Visit (INDEPENDENT_AMBULATORY_CARE_PROVIDER_SITE_OTHER): Payer: Self-pay

## 2017-01-25 ENCOUNTER — Ambulatory Visit (INDEPENDENT_AMBULATORY_CARE_PROVIDER_SITE_OTHER): Payer: 59

## 2017-01-25 ENCOUNTER — Ambulatory Visit (INDEPENDENT_AMBULATORY_CARE_PROVIDER_SITE_OTHER): Payer: 59 | Admitting: Orthopaedic Surgery

## 2017-01-25 DIAGNOSIS — S42255A Nondisplaced fracture of greater tuberosity of left humerus, initial encounter for closed fracture: Secondary | ICD-10-CM

## 2017-01-25 MED ORDER — METHOCARBAMOL 500 MG PO TABS: 500.0000 mg | ORAL_TABLET | Freq: Four times a day (QID) | ORAL | 2 refills | Status: DC | PRN

## 2017-01-25 MED FILL — OXYCODONE W/APAP 5/325 TAB: 5-325 | 5 days supply | Qty: 40 | Fill #0

## 2017-01-25 MED FILL — METHOCARBAMOL 500 MG TABLET: 500 | 7 days supply | Qty: 30 | Fill #0

## 2017-01-25 NOTE — Progress Notes (Signed)
Alisha Reed is one-week status post left nondisplaced greater tuberosity fracture. She follows up today for this and MRI of her right thumb to rule out ulnar collateral ligament tear. Her shoulder is stable. The sling is well fitting. MRI of her right hand showed a partial tear of her collateral ligament with edema. Negative for stener lesion.    For her shoulder continue with sling immobilization and nonweightbearing. No range of motion allowed. Follow-up in one week with AP and scapular Y of the left shoulder.  For the right hand. Referral to hand therapy was provided today.

## 2017-01-25 NOTE — Addendum Note (Signed)
Addended by: Azucena Cecil on: 01/25/2017 10:08 AM   Modules accepted: Orders

## 2017-01-27 ENCOUNTER — Encounter (INDEPENDENT_AMBULATORY_CARE_PROVIDER_SITE_OTHER): Payer: Self-pay | Admitting: Orthopaedic Surgery

## 2017-01-28 NOTE — Telephone Encounter (Signed)
PT for humerus and hand therapy for thumb.  Please fax in.  Thanks.

## 2017-02-01 ENCOUNTER — Ambulatory Visit (INDEPENDENT_AMBULATORY_CARE_PROVIDER_SITE_OTHER): Payer: 59 | Admitting: Orthopaedic Surgery

## 2017-02-01 ENCOUNTER — Ambulatory Visit (INDEPENDENT_AMBULATORY_CARE_PROVIDER_SITE_OTHER): Payer: 59

## 2017-02-01 ENCOUNTER — Encounter (INDEPENDENT_AMBULATORY_CARE_PROVIDER_SITE_OTHER): Payer: Self-pay | Admitting: Orthopaedic Surgery

## 2017-02-01 DIAGNOSIS — S42255A Nondisplaced fracture of greater tuberosity of left humerus, initial encounter for closed fracture: Secondary | ICD-10-CM

## 2017-02-01 MED ORDER — HYDROCODONE-ACETAMINOPHEN 7.5-325 MG PO TABS: 1.0000 | ORAL_TABLET | Freq: Four times a day (QID) | ORAL | 0 refills | Status: DC | PRN

## 2017-02-01 MED FILL — CELECOXIB 100 MG CAP: 100 | 15 days supply | Qty: 60 | Fill #3

## 2017-02-01 MED FILL — HYDROCODON-APAP 7.5-325: 7.5-325 | 4 days supply | Qty: 30 | Fill #0

## 2017-02-01 NOTE — Progress Notes (Signed)
Patient follows up today for follow-up of greater tuberosity fracture. Overall her pain is feeling better.   X-ray show stable alignment of the greater tuberosity fracture without any interval changes.  Follow-up next week for repeat AP and scapular Y of the left shoulder.

## 2017-02-02 ENCOUNTER — Ambulatory Visit: Payer: 59 | Attending: Orthopaedic Surgery | Admitting: Occupational Therapy

## 2017-02-02 DIAGNOSIS — M6281 Muscle weakness (generalized): Secondary | ICD-10-CM | POA: Diagnosis not present

## 2017-02-02 DIAGNOSIS — M25541 Pain in joints of right hand: Secondary | ICD-10-CM | POA: Diagnosis not present

## 2017-02-02 DIAGNOSIS — M25641 Stiffness of right hand, not elsewhere classified: Secondary | ICD-10-CM | POA: Diagnosis not present
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
## 2017-02-02 NOTE — Patient Instructions (Signed)
Your Splint This splint should initially be fitted by a healthcare practitioner.  The healthcare practitioner is responsible for providing wearing instructions and precautions to the patient, other healthcare practitioners and care provider involved in the patient's care.  This splint was custom made for you. Please read the following instructions to learn about wearing and caring for your splint.  Precautions  Should your splint cause any of the following problems, remove the splint immediately and contact your therapist at [phone]  Swelling  Severe Pain  Pressure Areas  Stiffness  Numbness  Do not wear your splint while operating machinery unless it has been fabricated for that purpose.  When To Wear Your Splint Where your splint according to your therapist/physician instructions. All the time. Take off 1-2 times per day to clean splint and hand, keeping thumb immobilized   Care and Cleaning of Your Splint 1. Keep your splint away from open flames. 2. Your splint will lose its shape in temperatures over 135 degrees Farenheit, ( in car windows, near radiators, ovens or in hot water).  Never make any adjustments to your splint, if the splint needs adjusting remove it and make an appointment to see your therapist. 3. Your splint, may be cleaned with rubbing alcohol.  Do not immerse in hot water over 135 degrees Farenheit.

## 2017-02-08 ENCOUNTER — Ambulatory Visit (INDEPENDENT_AMBULATORY_CARE_PROVIDER_SITE_OTHER): Payer: 59

## 2017-02-08 ENCOUNTER — Encounter (INDEPENDENT_AMBULATORY_CARE_PROVIDER_SITE_OTHER): Payer: Self-pay | Admitting: Orthopaedic Surgery

## 2017-02-08 ENCOUNTER — Ambulatory Visit (INDEPENDENT_AMBULATORY_CARE_PROVIDER_SITE_OTHER): Payer: 59 | Admitting: Orthopaedic Surgery

## 2017-02-08 DIAGNOSIS — M25512 Pain in left shoulder: Secondary | ICD-10-CM | POA: Diagnosis not present

## 2017-02-08 DIAGNOSIS — G8929 Other chronic pain: Secondary | ICD-10-CM

## 2017-02-08 DIAGNOSIS — S42255A Nondisplaced fracture of greater tuberosity of left humerus, initial encounter for closed fracture: Secondary | ICD-10-CM

## 2017-02-08 NOTE — Progress Notes (Signed)
Alisha Reed is 3 weeks status post left nondisplaced greater tuberosity fracture. She is doing much better now. She has been fitted for her thermoplastic hand splint for her partial ulnar collateral ligament tear. X-ray show stable alignment without any interval changes. At this point may begin physical therapy for gentle range of motion and active assisted range of motion. Follow-up in 3 weeks with repeat AP and scapular Y x-rays of the left shoulder. She may begin hand therapy per protocol that I reviewed.

## 2017-02-09 NOTE — Telephone Encounter (Signed)
Please extend Alisha Reed's leave until her next f/u appt with me.  Thanks.

## 2017-02-10 ENCOUNTER — Encounter (INDEPENDENT_AMBULATORY_CARE_PROVIDER_SITE_OTHER): Payer: Self-pay | Admitting: Orthopaedic Surgery

## 2017-02-11 ENCOUNTER — Other Ambulatory Visit (INDEPENDENT_AMBULATORY_CARE_PROVIDER_SITE_OTHER): Payer: Self-pay | Admitting: Orthopaedic Surgery

## 2017-02-11 ENCOUNTER — Encounter (INDEPENDENT_AMBULATORY_CARE_PROVIDER_SITE_OTHER): Payer: Self-pay | Admitting: Orthopaedic Surgery

## 2017-02-11 NOTE — Telephone Encounter (Signed)
Yes #30

## 2017-02-12 MED ORDER — HYDROCODONE-ACETAMINOPHEN 7.5-325 MG PO TABS: 1.0000 | ORAL_TABLET | Freq: Four times a day (QID) | ORAL | 0 refills | Status: DC | PRN

## 2017-02-16 ENCOUNTER — Ambulatory Visit: Payer: 59 | Attending: Orthopaedic Surgery | Admitting: Physical Therapy

## 2017-02-16 ENCOUNTER — Encounter: Payer: Self-pay | Admitting: Physical Therapy

## 2017-02-16 DIAGNOSIS — M25541 Pain in joints of right hand: Secondary | ICD-10-CM | POA: Insufficient documentation

## 2017-02-16 DIAGNOSIS — M25641 Stiffness of right hand, not elsewhere classified: Secondary | ICD-10-CM | POA: Diagnosis not present

## 2017-02-16 DIAGNOSIS — M6281 Muscle weakness (generalized): Secondary | ICD-10-CM | POA: Diagnosis not present

## 2017-02-16 DIAGNOSIS — M25512 Pain in left shoulder: Secondary | ICD-10-CM | POA: Insufficient documentation

## 2017-02-16 MED FILL — HYDROCODON-APAP 7.5-325: 7.5-325 | 4 days supply | Qty: 30 | Fill #0
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
## 2017-02-17 ENCOUNTER — Ambulatory Visit: Payer: 59 | Admitting: Physical Therapy

## 2017-02-17 DIAGNOSIS — M25512 Pain in left shoulder: Secondary | ICD-10-CM

## 2017-02-17 DIAGNOSIS — M25641 Stiffness of right hand, not elsewhere classified: Secondary | ICD-10-CM | POA: Diagnosis not present

## 2017-02-17 DIAGNOSIS — M25541 Pain in joints of right hand: Secondary | ICD-10-CM

## 2017-02-17 DIAGNOSIS — M6281 Muscle weakness (generalized): Secondary | ICD-10-CM

## 2017-02-17 NOTE — Therapy (Signed)
[place], [place], [address] Phone: [phone]   Fax:  [phone]  Physical Therapy Treatment  Patient Details  Name: Alisha Reed MRN: [id number] Date of Birth: 1960/07/03 Referring Provider: N. Eduard Roux, MD  Encounter Date: 02/17/2017      PT End of Session - 02/17/17 9798    Visit Number 2   Number of Visits 25   Date for PT Re-Evaluation 05/14/17   Authorization Type [facility]   PT Start Time 0716   PT Stop Time 0809   PT Time Calculation (min) 53 min      Past Medical History:  Diagnosis Date  . Thyroid disease     Past Surgical History:  Procedure Laterality Date  . cholecystectomy    . FOOT SURGERY Bilateral   . HYSTERECTOMY ABDOMINAL WITH SALPINGECTOMY    . NASAL SINUS SURGERY      There were no vitals filed for this visit.      Subjective Assessment - 02/17/17 0717    Subjective Pain is a 6 /10 pain due to not taking pain meds because she has to drive.    Currently in Pain? Yes   Pain Score 6    Pain Location Shoulder   Pain Orientation Left;Anterior   Pain Descriptors / Indicators Aching            [facility] PT Assessment - 02/17/17 0001      PROM   Left Shoulder Flexion 120 Degrees   Left Shoulder ABduction 90 Degrees   Left Shoulder External Rotation 35 Degrees                     [facility] Adult PT Treatment/Exercise - 02/17/17 0001      Shoulder Exercises: Supine   Other Supine Exercises supine cane series      Shoulder Exercises: ROM/Strengthening   Other ROM/Strengthening Exercises shoulder rolls forward and backward      Shoulder Exercises: Stretch   Other Shoulder Stretches upper trap, levator, scalene stretches     Modalities   Modalities Cryotherapy     Cryotherapy   Number Minutes Cryotherapy 15 Minutes   Cryotherapy Location Shoulder   Type of Cryotherapy Ice pack     Manual Therapy   Passive ROM GHJ all planes                 PT Education - 02/17/17 0753    Education provided Yes   Education Details HEP   Person(s) Educated Patient   Methods Explanation;Handout   Comprehension Verbalized understanding          PT Short Term Goals - 02/16/17 0934      PT SHORT TERM GOAL #1   Title Pt will demo PROM in Left GHJ equal to R   Baseline see flowsheet   Time 4   Period Weeks   Status New   Target Date 03/19/17     PT SHORT TERM GOAL #2   Title Pt will demo AAROM of shoulder without increase in shoulder pain   Baseline pain at eval   Time 4   Period Weeks   Status New   Target Date 03/19/17     PT SHORT TERM GOAL #3   Title 50% improvement in ability to sleep at night   Baseline limited to about 2 hours at a time at night   Time 4   Period Weeks   Status New   Target Date  03/19/17           PT Long Term Goals - 02/16/17 0939      PT LONG TERM GOAL #1   Title 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 shoulder pain   Baseline unable at eval   Time 12   Period Weeks   Status New   Target Date 05/14/17     PT LONG TERM GOAL #2   Title Pt will be able to return to riding bike without increase in shoulder pain for continued exercise   Baseline unable at eval   Time 12   Period Weeks   Status New   Target Date 05/14/17     PT LONG TERM GOAL #3   Title FOTO to 39% limitation to indicate significant improvement in functional ability   Baseline 76% limitation at eval   Time 12   Period Weeks   Status New   Target Date 05/14/17     PT LONG TERM GOAL #4   Title Pt will be able to complete all self care and household chores without limitation by shoulder   Baseline unable at eval   Time 12   Period Weeks   Status New   Target Date 05/14/17               Plan - 02/17/17 0742    Clinical Impression Statement Pt arrives with 6/10 pain and reports dificulty sleeping. Unable to take meds when driving. Reviewed HEP stretches  and added supine cane series.    PT Next Visit Plan DN upper trap, PROM, AAROM (no strengthening)   PT Home Exercise Plan median nerve glide, stretches: upper trap, levator, scalene; Supine chest press, pullovers, ER with wand    Consulted and Agree with Plan of Care Patient      Patient will benefit from skilled therapeutic intervention in order to improve the following deficits and impairments:  Decreased range of motion, Impaired UE functional use, Increased muscle spasms, Decreased activity tolerance, Pain, Improper body mechanics, Impaired flexibility, Decreased strength, Postural dysfunction  Visit Diagnosis: Acute pain of left shoulder  Muscle weakness (generalized)  Pain in joints of right hand     Problem List Patient Active Problem List   Diagnosis Date Noted  . Chronic left shoulder pain 02/08/2017  . Nondisplaced fracture of greater tuberosity of left humerus, initial encounter for closed fracture 01/18/2017  . Routine adult health maintenance 09/24/2016  . Hypothyroidism 09/24/2016    Dorene Ar, PTA 02/17/2017, 8:28 AM  [facility] [address] [place], [place], [address] Phone: [phone]   Fax:  [phone]  Name: Alisha Reed MRN: [id number] Date of Birth: 10-Oct-1960

## 2017-02-17 NOTE — Patient Instructions (Signed)
SHOULDER: External Rotation - Supine (Cane)   Hold cane with both hands. Rotate arm away from body. Keep elbow on floor and next to body. __10_ reps per set, __2_ sets per day, _7__ days per week Add towel to keep elbow at side.  Cane Exercise: Flexion   Lie on back, holding cane above chest. Keeping arms as straight as possible, lower cane toward floor beyond head. Hold __5__ seconds. Repeat __10__ times. Do ___2_ sessions per day.  http://[email or web address].exer.[email or web address]   Copyright  VHI. All rights reserved.

## 2017-02-23 ENCOUNTER — Ambulatory Visit: Payer: 59 | Admitting: Physical Therapy

## 2017-02-23 ENCOUNTER — Encounter: Payer: Self-pay | Admitting: Physical Therapy

## 2017-02-23 DIAGNOSIS — M6281 Muscle weakness (generalized): Secondary | ICD-10-CM | POA: Diagnosis not present

## 2017-02-23 DIAGNOSIS — M25541 Pain in joints of right hand: Secondary | ICD-10-CM | POA: Diagnosis not present

## 2017-02-23 DIAGNOSIS — M25512 Pain in left shoulder: Secondary | ICD-10-CM

## 2017-02-23 DIAGNOSIS — M25641 Stiffness of right hand, not elsewhere classified: Secondary | ICD-10-CM | POA: Diagnosis not present

## 2017-02-23 MED FILL — VENLAFAXINE HCL ER 150 MG C: 150 | 30 days supply | Qty: 30 | Fill #1

## 2017-02-23 NOTE — Therapy (Signed)
[place], [place], [address] Phone: [phone]   Fax:  [phone]  Physical Therapy Treatment  Patient Details  Name: Floreine Kingdon MRN: [id number] Date of Birth: 11/16/1960 Referring Provider: N. Eduard Roux, MD  Encounter Date: 02/23/2017      PT End of Session - 02/23/17 0801    Visit Number 3   Number of Visits 25   Date for PT Re-Evaluation 05/14/17   Authorization Type [facility]   PT Start Time 0800   PT Stop Time 0852   PT Time Calculation (min) 52 min   Activity Tolerance Patient tolerated treatment well   Behavior During Therapy [facility] for tasks assessed/performed      Past Medical History:  Diagnosis Date  . Thyroid disease     Past Surgical History:  Procedure Laterality Date  . cholecystectomy    . FOOT SURGERY Bilateral   . HYSTERECTOMY ABDOMINAL WITH SALPINGECTOMY    . NASAL SINUS SURGERY      There were no vitals filed for this visit.      Subjective Assessment - 02/23/17 0802    Subjective Tight and painful, trying to decrease use of pain pills and muscle relaxers.    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 Yes   Pain Score 5    Pain Location Shoulder   Pain Orientation Left   Aggravating Factors  does not know when it is going to hit me   Pain Relieving Factors rest                         [facility] Adult PT Treatment/Exercise - 02/23/17 0001      Shoulder Exercises: Seated   Other Seated Exercises scapular retraction with cervical rotations for proper alignment     Cryotherapy   Number Minutes Cryotherapy 10 Minutes   Cryotherapy Location Shoulder   Type of Cryotherapy Ice pack     Manual Therapy   Manual therapy comments skilled palpation & monitoring during TPDN   Soft tissue mobilization pectoralis major, upper trap L   Passive ROM GHJ within tolerance          Trigger Point Dry  Needling - 02/23/17 0836    Consent Given? Yes   Education Handout Provided --  verbal education   Muscles Treated Upper Body Upper trapezius;Pectoralis major   Upper Trapezius Response Twitch reponse elicited;Palpable increased muscle length   Pectoralis Major Response Twitch response elicited;Palpable increased muscle length              PT Education - 02/23/17 0842    Education provided Yes   Education Details TPDN & expected outcomes, importance of HEP & avoiding use of L upper extremity   Person(s) Educated Patient   Methods Explanation   Comprehension Verbalized understanding;Need further instruction          PT Short Term Goals - 02/16/17 0934      PT SHORT TERM GOAL #1   Title Pt will demo PROM in Left GHJ equal to R   Baseline see flowsheet   Time 4   Period Weeks   Status New   Target Date 03/19/17     PT SHORT TERM GOAL #2   Title Pt will demo AAROM of shoulder without increase in shoulder pain   Baseline pain at eval   Time 4   Period Weeks  Status New   Target Date 03/19/17     PT SHORT TERM GOAL #3   Title 50% improvement in ability to sleep at night   Baseline limited to about 2 hours at a time at night   Time 4   Period Weeks   Status New   Target Date 03/19/17           PT Long Term Goals - 02/16/17 0939      PT LONG TERM GOAL #1   Title 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 shoulder pain   Baseline unable at eval   Time 12   Period Weeks   Status New   Target Date 05/14/17     PT LONG TERM GOAL #2   Title Pt will be able to return to riding bike without increase in shoulder pain for continued exercise   Baseline unable at eval   Time 12   Period Weeks   Status New   Target Date 05/14/17     PT LONG TERM GOAL #3   Title FOTO to 39% limitation to indicate significant improvement in functional ability   Baseline 76% limitation at eval   Time 12   Period Weeks   Status New   Target  Date 05/14/17     PT LONG TERM GOAL #4   Title Pt will be able to complete all self care and household chores without limitation by shoulder   Baseline unable at eval   Time 12   Period Weeks   Status New   Target Date 05/14/17               Plan - 02/23/17 0843    Clinical Impression Statement Pt reported reduced pain following treatment today. Cont to have difficulty relaxing in PROM, pain at empty end feels. concordant pain found in trigger points of upper trap & pec major, reduced with DN.    PT Treatment/Interventions ADLs/Self Care Home Management;Cryotherapy;Electrical Stimulation;Functional mobility training;Traction;Moist Heat;Therapeutic activities;Therapeutic exercise;Neuromuscular re-education;Patient/family education;Passive range of motion;Manual techniques;Dry needling;Taping   PT Next Visit Plan DN outcomes?, PROM, AAROM (no strengthening)   PT Home Exercise Plan median nerve glide, stretches: upper trap, levator, scalene; Supine chest press, pullovers, ER with wand    Consulted and Agree with Plan of Care Patient      Patient will benefit from skilled therapeutic intervention in order to improve the following deficits and impairments:  Decreased range of motion, Impaired UE functional use, Increased muscle spasms, Decreased activity tolerance, Pain, Improper body mechanics, Impaired flexibility, Decreased strength, Postural dysfunction  Visit Diagnosis: Acute pain of left shoulder  Muscle weakness (generalized)     Problem List Patient Active Problem List   Diagnosis Date Noted  . Chronic left shoulder pain 02/08/2017  . Nondisplaced fracture of greater tuberosity of left humerus, initial encounter for closed fracture 01/18/2017  . Routine adult health maintenance 09/24/2016  . Hypothyroidism 09/24/2016    Cele Mote C. Rosario Kushner PT, DPT 02/23/17 8:45 AM   [facility] [address] [place],  [place], [address] Phone: [phone]   Fax:  [phone]  Name: Chaselyn Nanney MRN: [id number] Date of Birth: 1961/03/31

## 2017-02-24 ENCOUNTER — Ambulatory Visit: Payer: 59 | Admitting: Physical Therapy

## 2017-02-24 DIAGNOSIS — M25641 Stiffness of right hand, not elsewhere classified: Secondary | ICD-10-CM

## 2017-02-24 DIAGNOSIS — M6281 Muscle weakness (generalized): Secondary | ICD-10-CM

## 2017-02-24 DIAGNOSIS — M25512 Pain in left shoulder: Secondary | ICD-10-CM

## 2017-02-24 DIAGNOSIS — M25541 Pain in joints of right hand: Secondary | ICD-10-CM

## 2017-02-24 NOTE — Therapy (Signed)
[place], [place], [address] Phone: [phone]   Fax:  [phone]  Physical Therapy Treatment  Patient Details  Name: Alisha Reed MRN: [id number] Date of Birth: 10-09-1960 Referring Provider: N. Eduard Roux, MD  Encounter Date: 02/24/2017      PT End of Session - 02/24/17 0852    Visit Number 4   Number of Visits 25   Date for PT Re-Evaluation 05/14/17   Authorization Type [facility]   PT Start Time 0845   PT Stop Time 0935   PT Time Calculation (min) 50 min      Past Medical History:  Diagnosis Date  . Thyroid disease     Past Surgical History:  Procedure Laterality Date  . cholecystectomy    . FOOT SURGERY Bilateral   . HYSTERECTOMY ABDOMINAL WITH SALPINGECTOMY    . NASAL SINUS SURGERY      There were no vitals filed for this visit.      Subjective Assessment - 02/24/17 0851    Currently in Pain? Yes   Pain Score 2    Pain Location Shoulder   Pain Orientation Left   Pain Descriptors / Indicators Aching   Aggravating Factors  doing something I am not suppose to do rest                          [facility] Adult PT Treatment/Exercise - 02/24/17 0001      Shoulder Exercises: Supine   Other Supine Exercises supine cane series      Shoulder Exercises: Seated   Other Seated Exercises scapular retraction with cervical rotations for proper alignment     Shoulder Exercises: Pulleys   Flexion 2 minutes     Shoulder Exercises: Stretch   Other Shoulder Stretches upper trap, levator, scalene stretches     Cryotherapy   Number Minutes Cryotherapy 10 Minutes   Cryotherapy Location Shoulder   Type of Cryotherapy Ice pack     Manual Therapy   Passive ROM GHJ within tolerance                PT Education - 02/23/17 0842    Education provided Yes   Education Details TPDN & expected outcomes, importance of HEP & avoiding use of L upper extremity   Person(s)  Educated Patient   Methods Explanation   Comprehension Verbalized understanding;Need further instruction          PT Short Term Goals - 02/16/17 0934      PT SHORT TERM GOAL #1   Title Pt will demo PROM in Left GHJ equal to R   Baseline see flowsheet   Time 4   Period Weeks   Status New   Target Date 03/19/17     PT SHORT TERM GOAL #2   Title Pt will demo AAROM of shoulder without increase in shoulder pain   Baseline pain at eval   Time 4   Period Weeks   Status New   Target Date 03/19/17     PT SHORT TERM GOAL #3   Title 50% improvement in ability to sleep at night   Baseline limited to about 2 hours at a time at night   Time 4   Period Weeks   Status New   Target Date 03/19/17           PT Long Term Goals - 02/16/17 0939      PT LONG TERM GOAL #  1   Title 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 shoulder pain   Baseline unable at eval   Time 12   Period Weeks   Status New   Target Date 05/14/17     PT LONG TERM GOAL #2   Title Pt will be able to return to riding bike without increase in shoulder pain for continued exercise   Baseline unable at eval   Time 12   Period Weeks   Status New   Target Date 05/14/17     PT LONG TERM GOAL #3   Title FOTO to 39% limitation to indicate significant improvement in functional ability   Baseline 76% limitation at eval   Time 12   Period Weeks   Status New   Target Date 05/14/17     PT LONG TERM GOAL #4   Title Pt will be able to complete all self care and household chores without limitation by shoulder   Baseline unable at eval   Time 12   Period Weeks   Status New   Target Date 05/14/17               Plan - 02/24/17 0908    Clinical Impression Statement Pt reports pain reduced after TPDN yesterday. Began pulleys with good tolerance. Reviewed HEP and performed PROM to left shoulder as tolerated.    PT Next Visit Plan DN outcomes?, PROM, AAROM (no strengthening)    PT Home Exercise Plan median nerve glide, stretches: upper trap, levator, scalene; Supine chest press, pullovers, ER with wand    Consulted and Agree with Plan of Care Patient      Patient will benefit from skilled therapeutic intervention in order to improve the following deficits and impairments:  Decreased range of motion, Impaired UE functional use, Increased muscle spasms, Decreased activity tolerance, Pain, Improper body mechanics, Impaired flexibility, Decreased strength, Postural dysfunction  Visit Diagnosis: Acute pain of left shoulder  Muscle weakness (generalized)  Stiffness of right hand, not elsewhere classified  Pain in joints of right hand     Problem List Patient Active Problem List   Diagnosis Date Noted  . Chronic left shoulder pain 02/08/2017  . Nondisplaced fracture of greater tuberosity of left humerus, initial encounter for closed fracture 01/18/2017  . Routine adult health maintenance 09/24/2016  . Hypothyroidism 09/24/2016    Dorene Ar, PTA 02/24/2017, 9:37 AM  [facility] [address] [place], [place], [address] Phone: [phone]   Fax:  [phone]  Name: Alisha Reed MRN: [id number] Date of Birth: 1961-05-02

## 2017-02-25 ENCOUNTER — Encounter (INDEPENDENT_AMBULATORY_CARE_PROVIDER_SITE_OTHER): Payer: Self-pay | Admitting: Orthopaedic Surgery

## 2017-02-25 ENCOUNTER — Telehealth (INDEPENDENT_AMBULATORY_CARE_PROVIDER_SITE_OTHER): Payer: Self-pay | Admitting: Orthopaedic Surgery

## 2017-02-25 ENCOUNTER — Telehealth (INDEPENDENT_AMBULATORY_CARE_PROVIDER_SITE_OTHER): Payer: Self-pay

## 2017-02-25 NOTE — Telephone Encounter (Signed)
Signed Disability paperwork today for patient. She is aware. This will go back to [place].

## 2017-02-25 NOTE — Telephone Encounter (Signed)
Faxed signed disability form to [place] [phone] (original sent to [facility])

## 2017-02-26 NOTE — Telephone Encounter (Signed)
Patient called this morning stating that [employer] informed her that the disability forms are too blury and they need them refaxed.  Thank You.

## 2017-02-26 NOTE — Telephone Encounter (Signed)
FORMS WERE  SCANNED IN COLOR WHEN FAXED 10/18 & REFAXED TODAY. THE FORM WAS REFAXED FROM [employer] AS ORIGINALS HAVE GONE TO [employer]

## 2017-02-27 ENCOUNTER — Other Ambulatory Visit (INDEPENDENT_AMBULATORY_CARE_PROVIDER_SITE_OTHER): Payer: Self-pay | Admitting: Orthopaedic Surgery

## 2017-03-01 ENCOUNTER — Ambulatory Visit: Payer: 59 | Admitting: Physical Therapy

## 2017-03-01 ENCOUNTER — Other Ambulatory Visit (INDEPENDENT_AMBULATORY_CARE_PROVIDER_SITE_OTHER): Payer: Self-pay | Admitting: Orthopaedic Surgery

## 2017-03-01 ENCOUNTER — Encounter: Payer: Self-pay | Admitting: Physical Therapy

## 2017-03-01 DIAGNOSIS — M25541 Pain in joints of right hand: Secondary | ICD-10-CM | POA: Diagnosis not present

## 2017-03-01 DIAGNOSIS — M6281 Muscle weakness (generalized): Secondary | ICD-10-CM

## 2017-03-01 DIAGNOSIS — M25641 Stiffness of right hand, not elsewhere classified: Secondary | ICD-10-CM | POA: Diagnosis not present

## 2017-03-01 DIAGNOSIS — M25512 Pain in left shoulder: Secondary | ICD-10-CM

## 2017-03-01 MED ORDER — METHOCARBAMOL 750 MG PO TABS: 750.0000 mg | ORAL_TABLET | Freq: Two times a day (BID) | ORAL | 0 refills | Status: DC | PRN

## 2017-03-01 MED ORDER — HYDROCODONE-ACETAMINOPHEN 7.5-325 MG PO TABS: 1.0000 | ORAL_TABLET | Freq: Four times a day (QID) | ORAL | 0 refills | Status: DC | PRN

## 2017-03-01 NOTE — Therapy (Signed)
[place], [place], [address] Phone: [phone]   Fax:  [phone]  Physical Therapy Treatment  Patient Details  Name: Alisha Reed MRN: [id number] Date of Birth: 1960-08-15 Referring Provider: N. Eduard Roux, MD  Encounter Date: 03/01/2017      PT End of Session - 03/01/17 0958    Visit Number 5   Number of Visits 25   Date for PT Re-Evaluation 05/14/17   Authorization Type [facility]   PT Start Time 0958   PT Stop Time 1057   PT Time Calculation (min) 59 min   Activity Tolerance Patient tolerated treatment well;Patient limited by pain   Behavior During Therapy [facility] for tasks assessed/performed      Past Medical History:  Diagnosis Date  . Thyroid disease     Past Surgical History:  Procedure Laterality Date  . cholecystectomy    . FOOT SURGERY Bilateral   . HYSTERECTOMY ABDOMINAL WITH SALPINGECTOMY    . NASAL SINUS SURGERY      There were no vitals filed for this visit.      Subjective Assessment - 03/01/17 0958    Subjective It has been rough the last two days. Has been trying to back off of pain meds. 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 Yes   Pain Location Shoulder   Pain Orientation Left                         [facility] Adult PT Treatment/Exercise - 03/01/17 0001      Shoulder Exercises: Supine   Flexion Limitations AAROM with wand   Other Supine Exercises ER with cane     Shoulder Exercises: Seated   Other Seated Exercises UE ranger- flx, scaption, abd, ER   Other Seated Exercises table slides- flx, scaption, abd     Shoulder Exercises: Pulleys   Flexion 3 minutes     Cryotherapy   Number Minutes Cryotherapy 10 Minutes   Cryotherapy Location Shoulder;Other (comment)  Elbow   Type of Cryotherapy Ice pack                 PT Education - 03/01/17 1051    Education provided Yes   Education Details reducing use of arm, pain at this point, rationale for exercises, can use DN PRN, 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) Educated Patient   Methods Explanation;Demonstration;Tactile cues;Verbal cues   Comprehension Verbalized understanding;Returned demonstration;Verbal cues required;Tactile cues required;Need further instruction          PT Short Term Goals - 02/16/17 0934      PT SHORT TERM GOAL #1   Title Pt will demo PROM in Left GHJ equal to R   Baseline see flowsheet   Time 4   Period Weeks   Status New   Target Date 03/19/17     PT SHORT TERM GOAL #2   Title Pt will demo AAROM of shoulder without increase in shoulder pain   Baseline pain at eval   Time 4   Period Weeks   Status New   Target Date 03/19/17     PT SHORT TERM GOAL #3   Title 50% improvement in ability to sleep at night   Baseline limited to about 2 hours at a time at night  Time 4   Period Weeks   Status New   Target Date 03/19/17           PT Long Term Goals - 02/16/17 0939      PT LONG TERM GOAL #1   Title 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 shoulder pain   Baseline unable at eval   Time 12   Period Weeks   Status New   Target Date 05/14/17     PT LONG TERM GOAL #2   Title Pt will be able to return to riding bike without increase in shoulder pain for continued exercise   Baseline unable at eval   Time 12   Period Weeks   Status New   Target Date 05/14/17     PT LONG TERM GOAL #3   Title FOTO to 39% limitation to indicate significant improvement in functional ability   Baseline 76% limitation at eval   Time 12   Period Weeks   Status New   Target Date 05/14/17     PT LONG TERM GOAL #4   Title Pt will be able to complete all self care and household chores without limitation by shoulder   Baseline unable at eval   Time 12    Period Weeks   Status New   Target Date 05/14/17               Plan - 03/01/17 1048    Clinical Impression Statement Able to take L UE overhead on pulleys approx 120 with mild-mod discomfort. Utilized UE ranger for [employer] below shoulder height. Assured pt that pain at this point is still normal, is at 6 weeks post-accident and has a f/u xray in 2 weeks. Encouraged pt to limit use of UE.    PT Treatment/Interventions ADLs/Self Care Home Management;Cryotherapy;Electrical Stimulation;Functional mobility training;Traction;Moist Heat;Therapeutic activities;Therapeutic exercise;Neuromuscular re-education;Patient/family education;Passive range of motion;Manual techniques;Dry needling;Taping   PT Next Visit Plan DN PRN,  PROM, AAROM (no strengthening)   PT Home Exercise Plan median nerve glide, stretches: upper trap, levator, scalene; Supine chest press, pullovers, ER with wand    Consulted and Agree with Plan of Care Patient      Patient will benefit from skilled therapeutic intervention in order to improve the following deficits and impairments:  Decreased range of motion, Impaired UE functional use, Increased muscle spasms, Decreased activity tolerance, Pain, Improper body mechanics, Impaired flexibility, Decreased strength, Postural dysfunction  Visit Diagnosis: Acute pain of left shoulder  Muscle weakness (generalized)     Problem List Patient Active Problem List   Diagnosis Date Noted  . Chronic left shoulder pain 02/08/2017  . Nondisplaced fracture of greater tuberosity of left humerus, initial encounter for closed fracture 01/18/2017  . Routine adult health maintenance 09/24/2016  . Hypothyroidism 09/24/2016    Sequan Auxier C. Symia Herdt PT, DPT 03/01/17 10:52 AM   [facility] [address] [place], [place], [address] Phone: [phone]   Fax:  [phone]  Name: Alanie Syler MRN: [id number] Date of Birth:  07-04-1960

## 2017-03-01 NOTE — Telephone Encounter (Signed)
Approved #[id number]

## 2017-03-01 NOTE — Telephone Encounter (Signed)
Approved #[id number]

## 2017-03-02 ENCOUNTER — Ambulatory Visit: Payer: 59 | Admitting: Occupational Therapy

## 2017-03-02 DIAGNOSIS — M25541 Pain in joints of right hand: Secondary | ICD-10-CM | POA: Diagnosis not present

## 2017-03-02 DIAGNOSIS — M6281 Muscle weakness (generalized): Secondary | ICD-10-CM | POA: Diagnosis not present

## 2017-03-02 DIAGNOSIS — M25641 Stiffness of right hand, not elsewhere classified: Secondary | ICD-10-CM | POA: Diagnosis not present

## 2017-03-02 DIAGNOSIS — M25512 Pain in left shoulder: Secondary | ICD-10-CM | POA: Diagnosis not present

## 2017-03-02 MED FILL — METHOCARBAMOL 750 MG TABLET: 750 | 30 days supply | Qty: 60 | Fill #0

## 2017-03-02 MED FILL — HYDROCODON-APAP 7.5-325: 7.5-325 | 3 days supply | Qty: 20 | Fill #0

## 2017-03-02 NOTE — Therapy (Signed)
[place] [address] [place], [place], [address] Phone: [phone]   Fax:  [phone]  Occupational Therapy Treatment  Patient Details  Name: Alisha Reed MRN: [id number] Date of Birth: 1960-06-22 Referring Provider: Dr. Erlinda Hong  Encounter Date: 03/02/2017      OT End of Session - 03/02/17 0926    Visit Number 2   Number of Visits 12   Authorization Type [employer] employee, [employer] plan   OT Start Time [phone]   OT Stop Time 0920   OT Time Calculation (min) 35 min   Activity Tolerance Patient tolerated treatment well      Past Medical History:  Diagnosis Date  . Thyroid disease     Past Surgical History:  Procedure Laterality Date  . cholecystectomy    . FOOT SURGERY Bilateral   . HYSTERECTOMY ABDOMINAL WITH SALPINGECTOMY    . NASAL SINUS SURGERY      There were no vitals filed for this visit.      Subjective Assessment - 03/02/17 0844    Subjective  It only hurts if I try to lift something or open something   Pertinent History bilateral CTS   Patient Stated Goals Get my thumb better for working   Currently in Pain? Yes   Pain Score 2    Pain Location --  thumb   Pain Orientation Right   Pain Descriptors / Indicators Aching   Pain Type Acute pain   Pain Onset More than a month ago   Pain Frequency Intermittent   Aggravating Factors  doing something I am not supposed to with my thumb   Pain Relieving Factors rest                      OT Treatments/Exercises (OP) - 03/02/17 0001      ADLs   ADL Comments Reviewed precautions Rt thumb and discussed adaptations for writing, picking up and holding purse, wringing out washcloth, and other funtional tasks. Recommended pt continue to have husband assist with most tasks (like opening jars) d/t Lt humerus fx as well.      Exercises   Exercises Hand     Hand Exercises   Other Hand Exercises Pt issued gentle A/ROM HEP for Rt thumb  including: opposition, thumb palmer abduction, and IP flexion - see pt instructions for details. Pt instructed to avoid circumduction, palmer flexion and radial abduction motion at this time.      Splinting   Splinting Provided new strapping for Rt thumb splint and issued finger stockinette for comfort while in splint. However, instructed pt to have husband help don stockinette d/t compression component and to support Rt thumb while donning. Pt instructed to doff carefully. Pt still needs to wear splint b/t exercises and at night - pt agrees                OT Education - 03/02/17 0908    Education provided Yes   Education Details Gentle A/ROM HEP for Rt thumb   Person(s) Educated Patient   Methods Explanation;Demonstration;Handout;Verbal cues   Comprehension Verbalized understanding;Returned demonstration;Verbal cues required          OT Short Term Goals - 03/02/17 0926      OT SHORT TERM GOAL #1   Title Independent with splint wear and care   Time 4   Period Weeks   Status Achieved     OT SHORT TERM GOAL #2   Title Independent with  initial HEP for A/ROM Rt thumb   Time 4   Period Weeks   Status On-going           OT Long Term Goals - 02/02/17 1104      OT LONG TERM GOAL #1   Title Independent with HEP for P/ROM and strengthening Rt thumb   Time 8   Period Weeks   Status New     OT LONG TERM GOAL #2   Title Pt to return to basic ADLS like writing, fastening pants, bra, and tying shoes   Time 8   Period Weeks   Status New     OT LONG TERM GOAL #3   Title Pt to demo ROM Rt thumb WFL's in prep for IADLS and work related tasks   Time 8   Period Weeks   Status New     OT LONG TERM GOAL #4   Title Pt to demo 50% or greater grip strength Rt hand compared to Lt hand   Time 8   Period Weeks   Status New     OT LONG TERM GOAL #5   Title Lateral and 3 tip pinch to be 8 lbs or greater Rt hand   Time 8   Period Weeks   Status New                Plan - 03/02/17 4782    Clinical Impression Statement Pt returns today after 4 weeks being immobolized in Rt thumb splint. Pt issued gentle thumb A/ROM ex's per protocol and tolerating with no increase in pain if done properly.    Rehab Potential Good   OT Frequency 2x / week   OT Duration 8 weeks   OT Treatment/Interventions Self-care/ADL training;Moist Heat;Fluidtherapy;DME and/or AE instruction;Splinting;Patient/family education;Therapeutic exercises;Compression bandaging;Contrast Bath;Ultrasound;Therapeutic activities;Cryotherapy;Passive range of motion;Electrical Stimulation;Parrafin;Manual Therapy   Plan Pt to return in 6 weeks for unrestricted A/ROM to Rt thumb (per protocol) including thumb flex, radial abd, and circumduction ex's.       Patient will benefit from skilled therapeutic intervention in order to improve the following deficits and impairments:  Decreased coordination, Decreased range of motion, Impaired flexibility, Increased edema, Impaired sensation, Impaired UE functional use, Pain, Decreased strength, Decreased knowledge of precautions  Visit Diagnosis: Stiffness of right hand, not elsewhere classified  Pain in joints of right hand    Problem List Patient Active Problem List   Diagnosis Date Noted  . Chronic left shoulder pain 02/08/2017  . Nondisplaced fracture of greater tuberosity of left humerus, initial encounter for closed fracture 01/18/2017  . Routine adult health maintenance 09/24/2016  . Hypothyroidism 09/24/2016    Carey Bullocks, OTR/L 03/02/2017, 9:29 AM  [facility] [address] [place], [place], [address] Phone: [phone]   Fax:  [phone]  Name: Alisha Reed MRN: [id number] Date of Birth: 05/11/1961

## 2017-03-02 NOTE — Patient Instructions (Signed)
Opposition (Active)    Touch tip of thumb to nail tip of each finger in turn, making an "O" shape. Repeat _5___ times each finger. Do _6___ sessions per day.   AROM: Thumb Abduction / Adduction    Place little finger side on table. Actively pull right thumb away from palm as far as possible, and then back to starting position at index finger (do not do circles). Hold _2___ seconds. Then bring thumb back to touch fingers. Try not to bend fingers toward thumb. Repeat __15__ times per set. Do __1__ sets per session. Do __6__ sessions per day.   IP Flexion (Active Blocked)    Support base of thumb below tip joint. Bend tip joint as far as possible. Repeat _15___ times. Do __6__ sessions per day.

## 2017-03-03 ENCOUNTER — Ambulatory Visit: Payer: 59 | Admitting: Physical Therapy

## 2017-03-03 DIAGNOSIS — M25641 Stiffness of right hand, not elsewhere classified: Secondary | ICD-10-CM | POA: Diagnosis not present

## 2017-03-03 DIAGNOSIS — M25541 Pain in joints of right hand: Secondary | ICD-10-CM

## 2017-03-03 DIAGNOSIS — M25512 Pain in left shoulder: Secondary | ICD-10-CM

## 2017-03-03 DIAGNOSIS — M6281 Muscle weakness (generalized): Secondary | ICD-10-CM | POA: Diagnosis not present

## 2017-03-03 NOTE — Therapy (Signed)
[place], [place], [address] Phone: [phone]   Fax:  [phone]  Physical Therapy Treatment  Patient Details  Name: Alisha Reed MRN: [id number] Date of Birth: 04/18/1961 Referring Provider: N. Eduard Roux, MD  Encounter Date: 03/03/2017      PT End of Session - 03/03/17 0940    Visit Number 6   Number of Visits 25   Date for PT Re-Evaluation 05/14/17   Authorization Type [facility] UMR   PT Start Time 0930   PT Stop Time 1020   PT Time Calculation (min) 50 min      Past Medical History:  Diagnosis Date  . Thyroid disease     Past Surgical History:  Procedure Laterality Date  . cholecystectomy    . FOOT SURGERY Bilateral   . HYSTERECTOMY ABDOMINAL WITH SALPINGECTOMY    . NASAL SINUS SURGERY      There were no vitals filed for this visit.      Subjective Assessment - 03/03/17 0931    Subjective Some days are worse than others.    Currently in Pain? Yes   Pain Score 3    Pain Location Shoulder   Pain Orientation Right   Aggravating Factors  use it when I forget    Pain Relieving Factors rest, meds                         [facility] Adult PT Treatment/Exercise - 03/03/17 0001      Shoulder Exercises: Supine   Flexion Limitations AAROM with wand   Other Supine Exercises ER with cane     Shoulder Exercises: Standing   Other Standing Exercises UE ranger at 90 degrees for flexion, horizontal abduction and adduction. , IR and ERfrom neutral      Shoulder Exercises: Pulleys   Flexion 3 minutes     Cryotherapy   Number Minutes Cryotherapy 10 Minutes   Cryotherapy Location Shoulder   Type of Cryotherapy Ice pack     Manual Therapy   Soft tissue mobilization pecs, bicep, lateral border of scap    Passive ROM GHJ within tolerance                  PT Short Term Goals - 02/16/17 0934      PT SHORT TERM GOAL #1   Title Pt will demo PROM in Left GHJ equal to R   Baseline see flowsheet   Time 4   Period Weeks   Status New   Target Date 03/19/17     PT SHORT TERM GOAL #2   Title Pt will demo AAROM of shoulder without increase in shoulder pain   Baseline pain at eval   Time 4   Period Weeks   Status New   Target Date 03/19/17     PT SHORT TERM GOAL #3   Title 50% improvement in ability to sleep at night   Baseline limited to about 2 hours at a time at night   Time 4   Period Weeks   Status New   Target Date 03/19/17           PT Long Term Goals - 02/16/17 0939      PT LONG TERM GOAL #1   Title 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 shoulder pain   Baseline unable at eval   Time 12   Period Weeks   Status  New   Target Date 05/14/17     PT LONG TERM GOAL #2   Title Pt will be able to return to riding bike without increase in shoulder pain for continued exercise   Baseline unable at eval   Time 12   Period Weeks   Status New   Target Date 05/14/17     PT LONG TERM GOAL #3   Title FOTO to 39% limitation to indicate significant improvement in functional ability   Baseline 76% limitation at eval   Time 12   Period Weeks   Status New   Target Date 05/14/17     PT LONG TERM GOAL #4   Title Pt will be able to complete all self care and household chores without limitation by shoulder   Baseline unable at eval   Time 12   Period Weeks   Status New   Target Date 05/14/17               Plan - 03/03/17 1008    Clinical Impression Statement Continued AAROM and PROM as tolerated. Tenderness in lateral border of subscap noted as well as pecs and biceps. Decreased tenderness after soft tissue work.    PT Next Visit Plan DN PRN,  PROM, AAROM (no strengthening), soft tisse work   PT Home Exercise Plan median nerve glide, stretches: upper trap, levator, scalene; Supine chest press, pullovers, ER with wand    Consulted and Agree with Plan of Care Patient      Patient will benefit from  skilled therapeutic intervention in order to improve the following deficits and impairments:  Decreased range of motion, Impaired UE functional use, Increased muscle spasms, Decreased activity tolerance, Pain, Improper body mechanics, Impaired flexibility, Decreased strength, Postural dysfunction  Visit Diagnosis: Stiffness of right hand, not elsewhere classified  Pain in joints of right hand  Acute pain of left shoulder  Muscle weakness (generalized)     Problem List Patient Active Problem List   Diagnosis Date Noted  . Chronic left shoulder pain 02/08/2017  . Nondisplaced fracture of greater tuberosity of left humerus, initial encounter for closed fracture 01/18/2017  . Routine adult health maintenance 09/24/2016  . Hypothyroidism 09/24/2016    Dorene Ar, PTA 03/03/2017, 10:13 AM  [facility] [address] [place], [place], [address] Phone: [phone]   Fax:  [phone]  Name: Alisha Reed MRN: [id number] Date of Birth: 1960-11-15

## 2017-03-08 ENCOUNTER — Ambulatory Visit: Payer: 59 | Admitting: Physical Therapy

## 2017-03-08 DIAGNOSIS — M25512 Pain in left shoulder: Secondary | ICD-10-CM

## 2017-03-08 DIAGNOSIS — M25541 Pain in joints of right hand: Secondary | ICD-10-CM

## 2017-03-08 DIAGNOSIS — M25641 Stiffness of right hand, not elsewhere classified: Secondary | ICD-10-CM

## 2017-03-08 DIAGNOSIS — M6281 Muscle weakness (generalized): Secondary | ICD-10-CM

## 2017-03-08 NOTE — Therapy (Signed)
[place], [place], [address] Phone: [phone]   Fax:  [phone]  Physical Therapy Treatment  Patient Details  Name: Alisha Reed MRN: [id number] Date of Birth: 05-19-1960 Referring Provider: N. Eduard Roux, MD  Encounter Date: 03/08/2017      PT End of Session - 03/08/17 0717    Visit Number 7   Number of Visits 25   Date for PT Re-Evaluation 05/14/17   Authorization Type [facility]   PT Start Time 0715   PT Stop Time 0804   PT Time Calculation (min) 49 min      Past Medical History:  Diagnosis Date  . Thyroid disease     Past Surgical History:  Procedure Laterality Date  . cholecystectomy    . FOOT SURGERY Bilateral   . HYSTERECTOMY ABDOMINAL WITH SALPINGECTOMY    . NASAL SINUS SURGERY      There were no vitals filed for this visit.      Subjective Assessment - 03/08/17 0717    Subjective Achey  in bicep and under arm    Currently in Pain? Yes   Pain Score 2    Pain Location Shoulder   Pain Orientation Right   Pain Descriptors / Indicators Aching            [facility] PT Assessment - 03/08/17 0001      ROM / Strength   AROM / PROM / Strength AROM     AROM   AROM Assessment Site Shoulder   Right/Left Shoulder Right   Right Shoulder Flexion 145 Degrees   Right Shoulder ABduction 130 Degrees   Right Shoulder Internal Rotation --  FR to left buttock   Right Shoulder External Rotation --  FR to occiput                     [facility] Adult PT Treatment/Exercise - 03/08/17 0001      Shoulder Exercises: Supine   Protraction --   Flexion AROM  supine x 10    Flexion Limitations AAROM with wand   Other Supine Exercises ER with cane     Shoulder Exercises: Standing   Other Standing Exercises standing cane exercises and standing UE ranger reaching through available ROM flexion, horizontals, IR      Shoulder Exercises: Pulleys   Flexion 3 minutes   ABduction 2  minutes     Cryotherapy   Number Minutes Cryotherapy 10 Minutes   Cryotherapy Location Shoulder   Type of Cryotherapy Ice pack     Manual Therapy   Soft tissue mobilization pecs, bicep, lateral border of scap    Passive ROM GHJ within tolerance                  PT Short Term Goals - 03/08/17 0809      PT SHORT TERM GOAL #1   Title Pt will demo PROM in Left GHJ equal to R   Baseline see flowsheet   Time 4   Period Weeks   Status On-going     PT SHORT TERM GOAL #2   Title Pt will demo AAROM of shoulder without increase in shoulder pain   Baseline feels good, except ER   Time 4   Period Weeks   Status Partially Met     PT SHORT TERM GOAL #3   Title 50% improvement in ability to sleep at night   Baseline improving    Time 4   Period  Weeks   Status On-going           PT Long Term Goals - 02/16/17 0939      PT LONG TERM GOAL #1   Title 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 shoulder pain   Baseline unable at eval   Time 12   Period Weeks   Status New   Target Date 05/14/17     PT LONG TERM GOAL #2   Title Pt will be able to return to riding bike without increase in shoulder pain for continued exercise   Baseline unable at eval   Time 12   Period Weeks   Status New   Target Date 05/14/17     PT LONG TERM GOAL #3   Title FOTO to 39% limitation to indicate significant improvement in functional ability   Baseline 76% limitation at eval   Time 12   Period Weeks   Status New   Target Date 05/14/17     PT LONG TERM GOAL #4   Title Pt will be able to complete all self care and household chores without limitation by shoulder   Baseline unable at eval   Time 12   Period Weeks   Status New   Target Date 05/14/17               Plan - 03/08/17 0807    Clinical Impression Statement Pt reports frustration that her MD apointment was pushed back 2 weeks. She is eager to progress with PT. AROM improved. Began  AAROM  above shoulder height in standing with cane and UE ranger. Most pain and difficulty with IR/ER. Pain at anterior shoulder and biceps. Sub scap tenderness decreasing. Progressing toward STGs.    PT Next Visit Plan DN PRN,  PROM, AAROM (no strengthening), soft tisse work   PT Home Exercise Plan median nerve glide, stretches: upper trap, levator, scalene; Supine chest press, pullovers, ER with wand    Consulted and Agree with Plan of Care Patient      Patient will benefit from skilled therapeutic intervention in order to improve the following deficits and impairments:  Decreased range of motion, Impaired UE functional use, Increased muscle spasms, Decreased activity tolerance, Pain, Improper body mechanics, Impaired flexibility, Decreased strength, Postural dysfunction  Visit Diagnosis: Stiffness of right hand, not elsewhere classified  Pain in joints of right hand  Acute pain of left shoulder  Muscle weakness (generalized)     Problem List Patient Active Problem List   Diagnosis Date Noted  . Chronic left shoulder pain 02/08/2017  . Nondisplaced fracture of greater tuberosity of left humerus, initial encounter for closed fracture 01/18/2017  . Routine adult health maintenance 09/24/2016  . Hypothyroidism 09/24/2016    Dorene Ar , PTA 03/08/2017, [place], [place], [address] Phone: [phone]   Fax:  [phone]  Name: Alisha Reed MRN: [id number] Date of Birth: 09/21/1960

## 2017-03-09 ENCOUNTER — Encounter: Payer: Self-pay | Admitting: Family

## 2017-03-09 ENCOUNTER — Encounter: Payer: 59 | Admitting: Occupational Therapy

## 2017-03-09 ENCOUNTER — Other Ambulatory Visit (INDEPENDENT_AMBULATORY_CARE_PROVIDER_SITE_OTHER): Payer: Self-pay | Admitting: Orthopaedic Surgery

## 2017-03-10 ENCOUNTER — Ambulatory Visit: Payer: 59 | Admitting: Physical Therapy

## 2017-03-10 DIAGNOSIS — M25512 Pain in left shoulder: Secondary | ICD-10-CM | POA: Diagnosis not present

## 2017-03-10 DIAGNOSIS — M25641 Stiffness of right hand, not elsewhere classified: Secondary | ICD-10-CM | POA: Diagnosis not present

## 2017-03-10 DIAGNOSIS — M6281 Muscle weakness (generalized): Secondary | ICD-10-CM | POA: Diagnosis not present

## 2017-03-10 DIAGNOSIS — M25541 Pain in joints of right hand: Secondary | ICD-10-CM

## 2017-03-10 NOTE — Therapy (Signed)
[place], [place], [address] Phone: [phone]   Fax:  [phone]  Physical Therapy Treatment  Patient Details  Name: Alisha Reed MRN: [id number] Date of Birth: 1960/10/02 Referring Provider: N. Eduard Roux, MD  Encounter Date: 03/10/2017      PT End of Session - 03/10/17 0936    Visit Number 8   Number of Visits 25   Date for PT Re-Evaluation 05/14/17   Authorization Type [facility]   PT Start Time 0932   PT Stop Time 1025   PT Time Calculation (min) 53 min      Past Medical History:  Diagnosis Date  . Thyroid disease     Past Surgical History:  Procedure Laterality Date  . cholecystectomy    . FOOT SURGERY Bilateral   . HYSTERECTOMY ABDOMINAL WITH SALPINGECTOMY    . NASAL SINUS SURGERY      There were no vitals filed for this visit.      Subjective Assessment - 03/10/17 0934    Subjective I had pain while driving over here.    Currently in Pain? Yes   Pain Score 3    Pain Location Shoulder   Pain Orientation Right   Pain Descriptors / Indicators Aching   Aggravating Factors  random reaching.    Pain Relieving Factors rest             [facility] PT Assessment - 03/10/17 0001      Observation/Other Assessments   Focus on Therapeutic Outcomes (FOTO)  59%                     [facility] Adult PT Treatment/Exercise - 03/10/17 0001      Shoulder Exercises: Standing   Other Standing Exercises standing cane exercises and standing UE ranger reaching through available ROM flexion, horizontals, IR      Shoulder Exercises: Pulleys   Flexion 3 minutes   ABduction 2 minutes     Cryotherapy   Number Minutes Cryotherapy 10 Minutes   Cryotherapy Location Shoulder   Type of Cryotherapy Ice pack     Manual Therapy   Soft tissue mobilization pecs, bicep, lateral border of scap    Passive ROM GHJ within tolerance                  PT Short Term Goals - 03/08/17 0809       PT SHORT TERM GOAL #1   Title Pt will demo PROM in Left GHJ equal to R   Baseline see flowsheet   Time 4   Period Weeks   Status On-going     PT SHORT TERM GOAL #2   Title Pt will demo AAROM of shoulder without increase in shoulder pain   Baseline feels good, except ER   Time 4   Period Weeks   Status Partially Met     PT SHORT TERM GOAL #3   Title 50% improvement in ability to sleep at night   Baseline improving    Time 4   Period Weeks   Status On-going           PT Long Term Goals - 02/16/17 1962      PT LONG TERM GOAL #1   Title 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 shoulder pain   Baseline unable at eval   Time 12   Period Weeks   Status New   Target Date  05/14/17     PT LONG TERM GOAL #2   Title Pt will be able to return to riding bike without increase in shoulder pain for continued exercise   Baseline unable at eval   Time 12   Period Weeks   Status New   Target Date 05/14/17     PT LONG TERM GOAL #3   Title FOTO to 39% limitation to indicate significant improvement in functional ability   Baseline 76% limitation at eval   Time 12   Period Weeks   Status New   Target Date 05/14/17     PT LONG TERM GOAL #4   Title Pt will be able to complete all self care and household chores without limitation by shoulder   Baseline unable at eval   Time 12   Period Weeks   Status New   Target Date 05/14/17               Plan - 03/10/17 1120    Clinical Impression Statement Pt sees MD Monday in anticipation to return to work light duty. Continued AAROM exercises as tolerated. Still tender anterior shoulder at bicep and pectoarlis. Limited ER due to pain and tightness. FOTO score imoproved to 59% limite.    PT Next Visit Plan DN PRN,  PROM, AAROM (no strengthening), soft tisse work   PT Home Exercise Plan median nerve glide, stretches: upper trap, levator, scalene; Supine chest press, pullovers, ER with wand     Consulted and Agree with Plan of Care Patient      Patient will benefit from skilled therapeutic intervention in order to improve the following deficits and impairments:  Decreased range of motion, Impaired UE functional use, Increased muscle spasms, Decreased activity tolerance, Pain, Improper body mechanics, Impaired flexibility, Decreased strength, Postural dysfunction  Visit Diagnosis: Stiffness of right hand, not elsewhere classified  Pain in joints of right hand  Acute pain of left shoulder  Muscle weakness (generalized)     Problem List Patient Active Problem List   Diagnosis Date Noted  . Chronic left shoulder pain 02/08/2017  . Nondisplaced fracture of greater tuberosity of left humerus, initial encounter for closed fracture 01/18/2017  . Routine adult health maintenance 09/24/2016  . Hypothyroidism 09/24/2016    Dorene Ar, PTA 03/10/2017, 11:24 AM  [facility] [address] [place], [place], [address] Phone: [phone]   Fax:  [phone]  Name: Alisha Reed MRN: [id number] Date of Birth: Sep 24, 1960

## 2017-03-15 ENCOUNTER — Encounter (INDEPENDENT_AMBULATORY_CARE_PROVIDER_SITE_OTHER): Payer: Self-pay | Admitting: Orthopaedic Surgery

## 2017-03-15 ENCOUNTER — Ambulatory Visit (INDEPENDENT_AMBULATORY_CARE_PROVIDER_SITE_OTHER): Payer: 59

## 2017-03-15 ENCOUNTER — Ambulatory Visit (INDEPENDENT_AMBULATORY_CARE_PROVIDER_SITE_OTHER): Payer: 59 | Admitting: Orthopaedic Surgery

## 2017-03-15 DIAGNOSIS — S42255A Nondisplaced fracture of greater tuberosity of left humerus, initial encounter for closed fracture: Secondary | ICD-10-CM | POA: Diagnosis not present

## 2017-03-15 DIAGNOSIS — M25512 Pain in left shoulder: Secondary | ICD-10-CM

## 2017-03-15 DIAGNOSIS — S63641D Sprain of metacarpophalangeal joint of right thumb, subsequent encounter: Secondary | ICD-10-CM

## 2017-03-15 DIAGNOSIS — G8929 Other chronic pain: Secondary | ICD-10-CM | POA: Diagnosis not present

## 2017-03-15 MED ORDER — BUPIVACAINE HCL 0.5 % IJ SOLN
3.0000 mL | INTRAMUSCULAR | Status: AC | PRN
  Administered 2017-03-15: 3 mL via INTRA_ARTICULAR

## 2017-03-15 MED ORDER — LIDOCAINE HCL (PF) 1 % IJ SOLN
2.0000 mL | INTRAMUSCULAR | Status: AC | PRN
  Administered 2017-03-15: 2 mL

## 2017-03-15 MED ORDER — TRAMADOL HCL 50 MG PO TABS: 50.0000 mg | ORAL_TABLET | Freq: Three times a day (TID) | ORAL | 2 refills | Status: DC | PRN

## 2017-03-15 MED ORDER — CELECOXIB 200 MG PO CAPS: 200.0000 mg | ORAL_CAPSULE | Freq: Two times a day (BID) | ORAL | 3 refills | Status: DC

## 2017-03-15 MED ORDER — TRIAMCINOLONE ACETONIDE 40 MG/ML IJ SUSP
40.0000 mg | INTRAMUSCULAR | Status: AC | PRN
  Administered 2017-03-15: 40 mg via INTRA_ARTICULAR

## 2017-03-15 MED FILL — traMADol HCL 50 MG TABS: 50 | 5 days supply | Qty: 30 | Fill #0

## 2017-03-15 MED FILL — CELECOXIB 200 MG CAPS: 200 | 15 days supply | Qty: 30 | Fill #0

## 2017-03-15 NOTE — Progress Notes (Signed)
Office Visit Note   Patient: Alisha Reed           Date of Birth: 1960/09/20           MRN: [id number] Visit Date: 03/15/2017              Requested by: [facility], [address] [place], [place] [address] PCP: [facility], FNP   Assessment & Plan: Visit Diagnoses:  1. Nondisplaced fracture of greater tuberosity of left humerus, initial encounter for closed fracture   2. Skier's thumb, right, subsequent encounter     Plan: Impression is healed left greater tuberosity fracture with adhesive capsulitis and healing right skiers thumb.  She may begin to wean the Thermoplast splint of her thumb.  I do want her to get a cortisone injection with her left shoulder to help loosen things up and facilitate physical therapy.  I would like to have her get another injection in 6 weeks after that.  I will see her back in 6 weeks.  She may work without use of the left arm from lifting.  Prescription for tramadol and Celebrex.  Follow-Up Instructions: Return in about 6 weeks (around 04/26/2017).   Orders:  Orders Placed This Encounter  Procedures  . XR Shoulder Left   Meds ordered this encounter  Medications  . traMADol (ULTRAM) 50 MG tablet    Sig: Take 1-2 tablets (50-100 mg total) 3 (three) times daily as needed by mouth.    Dispense:  30 tablet    Refill:  2  . celecoxib (CELEBREX) 200 MG capsule    Sig: Take 1 capsule (200 mg total) 2 (two) times daily by mouth.    Dispense:  30 capsule    Refill:  3      Procedures: No procedures performed   Clinical Data: No additional findings.   Subjective: Chief Complaint  Patient presents with  . Left Shoulder - Pain    Alisha Reed follows up today from left greater tuberosity fracture and partial right thumb ulnar collateral ligament tear.  She is overall improving with her right thumb.  She is frustrated with her left shoulder.  She is having difficulty with joint mobilization.  She is having difficulty  sleeping.  She has no pain with activity below the waist but has trouble elevating her arm.  Her right hand and thumb are improving.  Her grip is improving.    Review of Systems  Constitutional: Negative.   HENT: Negative.   Eyes: Negative.   Respiratory: Negative.   Cardiovascular: Negative.   Endocrine: Negative.   Musculoskeletal: Negative.   Neurological: Negative.   Hematological: Negative.   Psychiatric/Behavioral: Negative.   All other systems reviewed and are negative.    Objective: Vital Signs: There were no vitals taken for this visit.  Physical Exam  Constitutional: She is oriented to person, place, and time. She appears well-developed and well-nourished.  Pulmonary/Chest: Effort normal.  Neurological: She is alert and oriented to person, place, and time.  Skin: Skin is warm. Capillary refill takes less than 2 seconds.  Psychiatric: She has a normal mood and affect. Her behavior is normal. Judgment and thought content normal.  Nursing note and vitals reviewed.   Ortho Exam Left shoulder exam shows no swelling or skin changes.  She does have limited for flexion and external rotation and abduction and internal rotation consistent with adhesive capsulitis.  Right thumb exam shows a firm endpoint with mild tenderness of the  ulnar collateral ligament. Specialty Comments:  No specialty comments available.  Imaging: Xr Shoulder Left  Result Date: 03/15/2017 Healed greater tuberosity fracture    PMFS History: Patient Active Problem List   Diagnosis Date Noted  . Skier's thumb, right, subsequent encounter 03/15/2017  . Chronic left shoulder pain 02/08/2017  . Nondisplaced fracture of greater tuberosity of left humerus, initial encounter for closed fracture 01/18/2017  . Routine adult health maintenance 09/24/2016  . Hypothyroidism 09/24/2016   Past Medical History:  Diagnosis Date  . Thyroid disease     Family History  Problem Relation Age of Onset  .  Scleroderma Mother   . Rheum arthritis Mother   . Heart disease Mother   . Heart disease Father   . Glaucoma Father   . Stomach cancer Maternal Grandmother   . Heart attack Maternal Grandfather   . Alzheimer's disease Paternal Grandmother   . Throat cancer Paternal Grandfather     Past Surgical History:  Procedure Laterality Date  . cholecystectomy    . FOOT SURGERY Bilateral   . HYSTERECTOMY ABDOMINAL WITH SALPINGECTOMY    . NASAL SINUS SURGERY     Social History   Occupational History  . Occupation: Therapist, sports  Tobacco Use  . Smoking status: Never Smoker  . Smokeless tobacco: Never Used  Substance and Sexual Activity  . Alcohol use: Yes    Comment: Scoial  . Drug use: No  . Sexual activity: Yes    Birth control/protection: Surgical

## 2017-03-15 NOTE — Patient Instructions (Signed)

## 2017-03-15 NOTE — Progress Notes (Addendum)
Adilenne Ashworth - 56 y.o. female MRN [id number]  Date of birth: November 10, 1960  Office Visit Note: Visit Date: 03/15/2017 PCP: [facility], FNP Referred by: [facility], FNP  Subjective: Chief Complaint  Patient presents with  . Left Shoulder - Pain   HPI: Mrs. Lineberry is a 56 year old female with left shoulder pain she saw Dr. Erlinda Hong today for evaluation and management.  He requested a work in [place] and hopefully therapeutic    ROS Otherwise per HPI.  Assessment & Plan: Visit Diagnoses:  1. Nondisplaced fracture of greater tuberosity of left humerus, initial encounter for closed fracture   2. Skier's thumb, right, subsequent encounter     Plan: Findings:  Left diagnostic and hopefully therapeutic anesthetic glenohumeral joint arthrogram.  Patient did not have relief of symptoms during the anesthetic phase.    Meds & Orders:  Meds ordered this encounter  Medications  . traMADol (ULTRAM) 50 MG tablet    Sig: Take 1-2 tablets (50-100 mg total) 3 (three) times daily as needed by mouth.    Dispense:  30 tablet    Refill:  2  . celecoxib (CELEBREX) 200 MG capsule    Sig: Take 1 capsule (200 mg total) 2 (two) times daily by mouth.    Dispense:  30 capsule    Refill:  3    Orders Placed This Encounter  Procedures  . XR Shoulder Left    Follow-up: Return in about 6 weeks (around 04/26/2017).   Procedures: Large Joint Inj: L glenohumeral on 03/15/2017 9:57 AM Indications: pain and diagnostic evaluation Details: 22 G 3.5 in needle, anterior approach  Arthrogram: Yes  Medications: 3 mL bupivacaine 0.5 %; 2 mL lidocaine (PF) 1 %; 40 mg triamcinolone acetonide 40 MG/ML Outcome: tolerated well, no immediate complications  Excellent flow of contrast producing arthrogram.  There is no extravasation of contrast.  The patient did NOT have relief of symptoms during the anesthetic phase. Procedure, treatment alternatives, risks and benefits explained, specific risks  discussed. Consent was given by the patient. Immediately prior to procedure a time out was called to verify the correct patient, procedure, equipment, support staff and site/side marked as required. Patient was prepped and draped in the usual sterile fashion.      No notes on file   Clinical History: No specialty comments available.  She reports that  has never smoked. she has never used smokeless tobacco. No results for input(s): HGBA1C, LABURIC in the last 8760 hours.  Objective:  VS:  HT:    WT:   BMI:     BP:   HR: bpm  TEMP: ( )  RESP:  Physical Exam  Musculoskeletal:  Decreased and painful range of motion of the left shoulder.    Ortho Exam Imaging: Xr Shoulder Left  Result Date: 03/15/2017 Healed greater tuberosity fracture   Past Medical/Family/Surgical/Social History: Medications & Allergies reviewed per EMR Patient Active Problem List   Diagnosis Date Noted  . Skier's thumb, right, subsequent encounter 03/15/2017  . Chronic left shoulder pain 02/08/2017  . Nondisplaced fracture of greater tuberosity of left humerus, initial encounter for closed fracture 01/18/2017  . Routine adult health maintenance 09/24/2016  . Hypothyroidism 09/24/2016   Past Medical History:  Diagnosis Date  . Thyroid disease    Family History  Problem Relation Age of Onset  . Scleroderma Mother   . Rheum arthritis Mother   . Heart disease Mother   . Heart disease Father   . Glaucoma Father   .  Stomach cancer Maternal Grandmother   . Heart attack Maternal Grandfather   . Alzheimer's disease Paternal Grandmother   . Throat cancer Paternal Grandfather    Past Surgical History:  Procedure Laterality Date  . cholecystectomy    . FOOT SURGERY Bilateral   . HYSTERECTOMY ABDOMINAL WITH SALPINGECTOMY    . NASAL SINUS SURGERY     Social History   Occupational History  . Occupation: Therapist, sports  Tobacco Use  . Smoking status: Never Smoker  . Smokeless tobacco: Never Used  Substance and  Sexual Activity  . Alcohol use: Yes    Comment: Scoial  . Drug use: No  . Sexual activity: Yes    Birth control/protection: Surgical

## 2017-03-15 NOTE — Addendum Note (Signed)
Addended by: Raymondo Band on: 03/15/2017 10:00 AM   Modules accepted: Orders

## 2017-03-16 ENCOUNTER — Ambulatory Visit: Payer: 59 | Attending: Orthopaedic Surgery | Admitting: Occupational Therapy

## 2017-03-16 DIAGNOSIS — M6281 Muscle weakness (generalized): Secondary | ICD-10-CM | POA: Insufficient documentation

## 2017-03-16 DIAGNOSIS — M25541 Pain in joints of right hand: Secondary | ICD-10-CM | POA: Insufficient documentation

## 2017-03-16 DIAGNOSIS — M25641 Stiffness of right hand, not elsewhere classified: Secondary | ICD-10-CM | POA: Insufficient documentation

## 2017-03-16 DIAGNOSIS — M25512 Pain in left shoulder: Secondary | ICD-10-CM | POA: Insufficient documentation

## 2017-03-16 NOTE — Patient Instructions (Signed)
Palmar Adduction/Abduction (Active)    Move thumb down, away from palm. Move back to rest along palm. Repeat _10___ times. Do _4-6___ sessions per day.    Opposition (Active)   Touch tip of thumb to nail tip of each finger in turn, making an "O" shape. Repeat __10__ times. Do _4-6___ sessions per day.   IP Flexion (Active Blocked)   Brace thumb below tip joint. Bend joint as far as possible. Repeat __10__ times. Do _4-6___ sessions per day.   MP Flexion (Active)   Bend thumb to touch base of little finger, keeping tip joint straight. Repeat __10__ times. Do _4-6___ sessions per day.     Composite Extension (Active)   Bring thumb up and out in hitchhiker position.  Repeat __10__ times. Do _4-6___ sessions per day.   Composite Movement Circumduction (Active)    Make circles with thumb. Repeat _10___ times. Do _4-6___ sessions per day.

## 2017-03-16 NOTE — Therapy (Signed)
[facility] [address] [place], [place], [address] Phone: [phone]   Fax:  [phone]  Occupational Therapy Treatment  Patient Details  Name: Alisha Reed MRN: [id number] Date of Birth: May 22, 1960 Referring Provider: Dr. Erlinda Hong   Encounter Date: 03/16/2017  OT End of Session - 03/16/17 0849    Visit Number  3    Number of Visits  [employer] employee, [employer] plan    OT Start Time  0800 12 min. of ice - unbillable   12 min. of ice - unbillable   OT Stop Time  0845    OT Time Calculation (min)  45 min    Activity Tolerance  Patient tolerated treatment well    Behavior During Therapy  WFL for tasks assessed/performed       Past Medical History:  Diagnosis Date  . Thyroid disease     Past Surgical History:  Procedure Laterality Date  . cholecystectomy    . FOOT SURGERY Bilateral   . HYSTERECTOMY ABDOMINAL WITH SALPINGECTOMY    . NASAL SINUS SURGERY      There were no vitals filed for this visit.  Subjective Assessment - 03/16/17 0802    Subjective   I go back to work today on light duty    Pertinent History  bilateral CTS, Lt proximal humerus fx (non displaced)     Patient Stated Goals  Get my thumb better for working    Currently in Pain?  Yes    Pain Score  3     Pain Location  -- thumb   thumb   Pain Orientation  Right    Pain Descriptors / Indicators  Aching    Pain Type  Acute pain    Pain Onset  More than a month ago    Pain Frequency  Intermittent    Aggravating Factors   certain movement    Pain Relieving Factors  rest                   OT Treatments/Exercises (OP) - 03/16/17 0001      Hand Exercises   Other Hand Exercises  Pt issued unrestricted A/ROM HEP for Rt thumb (per collateral ligament sprain MP joint)  - see pt instructions for details. Pt performed x 10 reps each exercise      Splinting   Splinting  Adjusted thumb opponens splint with  new hook/strap. Pt also issued neoprene CMC splint to support thumb while at work for greater comfort as pt reports orthoplast splint rubs her and causes irritation. Pt still to wear orthoplast splint at night. Pt advised that if neoprene splint not offering enough support, then to apply moleskin to orthoplast splint at irritating place and wear for work - pt agrees               OT Short Term Goals - 03/16/17 0850      OT SHORT TERM GOAL #1   Title  Independent with splint wear and care    Time  4    Period  Weeks    Status  Achieved      OT SHORT TERM GOAL #2   Title  Independent with initial HEP for A/ROM Rt thumb    Time  4    Period  Weeks    Status  Achieved        OT Long Term Goals - 02/02/17 1104  OT LONG TERM GOAL #1   Title  Independent with HEP for P/ROM and strengthening Rt thumb    Time  8    Period  Weeks    Status  New      OT LONG TERM GOAL #2   Title  Pt to return to basic ADLS like writing, fastening pants, bra, and tying shoes    Time  8    Period  Weeks    Status  New      OT LONG TERM GOAL #3   Title  Pt to demo ROM Rt thumb WFL's in prep for IADLS and work related tasks    Time  8    Period  Weeks    Status  New      OT LONG TERM GOAL #4   Title  Pt to demo 50% or greater grip strength Rt hand compared to Lt hand    Time  8    Period  Weeks    Status  New      OT LONG TERM GOAL #5   Title  Lateral and 3 tip pinch to be 8 lbs or greater Rt hand    Time  8    Period  Weeks    Status  New            Plan - 03/16/17 0850    Clinical Impression Statement  Pt is progressing per protocol and has now been immobolized for 6 weeks (taking splint off to perform A/ROM ex's) for Rt thumb.     Rehab Potential  Good    OT Frequency  2x / week    OT Duration  8 weeks    OT Treatment/Interventions  Self-care/ADL training;Moist Heat;Fluidtherapy;DME and/or AE instruction;Splinting;Patient/family education;Therapeutic  exercises;Compression bandaging;Contrast Bath;Ultrasound;Therapeutic activities;Cryotherapy;Passive range of motion;Electrical Stimulation;Parrafin;Manual Therapy    Plan  Pt to return in 2 weeks (to follow 8 week protocol) and can d/c hard orthoplast splint if relatively pain free       Patient will benefit from skilled therapeutic intervention in order to improve the following deficits and impairments:  Decreased coordination, Decreased range of motion, Impaired flexibility, Increased edema, Impaired sensation, Impaired UE functional use, Pain, Decreased strength, Decreased knowledge of precautions  Visit Diagnosis: Stiffness of right hand, not elsewhere classified  Pain in joints of right hand    Problem List Patient Active Problem List   Diagnosis Date Noted  . Skier's thumb, right, subsequent encounter 03/15/2017  . Chronic left shoulder pain 02/08/2017  . Nondisplaced fracture of greater tuberosity of left humerus, initial encounter for closed fracture 01/18/2017  . Routine adult health maintenance 09/24/2016  . Hypothyroidism 09/24/2016    Carey Bullocks, OTR/L 03/16/2017, 8:54 AM  [facility] [address] [place], [place], [address] Phone: [phone]   Fax:  [phone]  Name: Alisha Reed MRN: [id number] Date of Birth: [birth year]

## 2017-03-17 ENCOUNTER — Ambulatory Visit: Payer: 59 | Admitting: Physical Therapy

## 2017-03-18 ENCOUNTER — Ambulatory Visit: Payer: 59 | Admitting: Physical Therapy

## 2017-03-18 ENCOUNTER — Encounter: Payer: Self-pay | Admitting: Physical Therapy

## 2017-03-18 DIAGNOSIS — M25512 Pain in left shoulder: Secondary | ICD-10-CM | POA: Diagnosis not present

## 2017-03-18 DIAGNOSIS — M6281 Muscle weakness (generalized): Secondary | ICD-10-CM

## 2017-03-18 DIAGNOSIS — M25541 Pain in joints of right hand: Secondary | ICD-10-CM

## 2017-03-18 DIAGNOSIS — M25641 Stiffness of right hand, not elsewhere classified: Secondary | ICD-10-CM

## 2017-03-18 NOTE — Therapy (Signed)
[facility] [place], [place], [address] Phone: [phone]   Fax:  [phone]  Physical Therapy Treatment  Patient Details  Name: Alisha Reed MRN: [id number] Date of Birth: June 24, 1960 Referring Provider: N. Eduard Roux, MD   Encounter Date: 03/18/2017  PT End of Session - 03/18/17 1100    Visit Number  9    Number of Visits  25    Date for PT Re-Evaluation  05/14/17    Authorization Type  [facility] [employer]    PT Start Time  1100    PT Stop Time  1153    PT Time Calculation (min)  53 min    Activity Tolerance  Patient tolerated treatment well    Behavior During Therapy  WFL for tasks assessed/performed       Past Medical History:  Diagnosis Date  . Thyroid disease     Past Surgical History:  Procedure Laterality Date  . cholecystectomy    . FOOT SURGERY Bilateral   . HYSTERECTOMY ABDOMINAL WITH SALPINGECTOMY    . NASAL SINUS SURGERY      There were no vitals filed for this visit.  Subjective Assessment - 03/18/17 1100    Subjective  Cortisone injection on Monday. Reports it feels better, feels like it has more motion. Aching and pain in shoulder is gone but feels it the muscles of her arm. 2nd day back to work on light duty.     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  No/denies    Aggravating Factors   movements    Pain Relieving Factors  rest                      [facility] Adult PT Treatment/Exercise - 03/18/17 0001      Shoulder Exercises: Sidelying   External Rotation  20 reps    ABduction  20 reps      Shoulder Exercises: Standing   Flexion  15 reps yellow   yellow   Row  15 reps yellow   yellow     Shoulder Exercises: Pulleys   Flexion  2 minutes    ABduction  2 minutes      Shoulder Exercises: ROM/Strengthening   UBE (Upper Arm Bike)  2'/2'      Cryotherapy   Number Minutes Cryotherapy  10 Minutes    Cryotherapy  Location  Shoulder    Type of Cryotherapy  Ice pack      Manual Therapy   Manual Therapy  Joint mobilization    Joint Mobilization  inf mobs at end range abduction and flexion    Passive ROM  GHJ all motions               PT Short Term Goals - 03/08/17 0809      PT SHORT TERM GOAL #1   Title  Pt will demo PROM in Left GHJ equal to R    Baseline  see flowsheet    Time  4    Period  Weeks    Status  On-going      PT SHORT TERM GOAL #2   Title  Pt will demo AAROM of shoulder without increase in shoulder pain    Baseline  feels good, except ER    Time  4    Period  Weeks    Status  Partially Met      PT  SHORT TERM GOAL #3   Title  50% improvement in ability to sleep at night    Baseline  improving     Time  4    Period  Weeks    Status  On-going        PT Long Term Goals - 02/16/17 0939      PT LONG TERM GOAL #1   Title  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 shoulder pain    Baseline  unable at eval    Time  12    Period  Weeks    Status  New    Target Date  05/14/17      PT LONG TERM GOAL #2   Title  Pt will be able to return to riding bike without increase in shoulder pain for continued exercise    Baseline  unable at eval    Time  12    Period  Weeks    Status  New    Target Date  05/14/17      PT LONG TERM GOAL #3   Title  FOTO to 39% limitation to indicate significant improvement in functional ability    Baseline  76% limitation at eval    Time  12    Period  Weeks    Status  New    Target Date  05/14/17      PT LONG TERM GOAL #4   Title  Pt will be able to complete all self care and household chores without limitation by shoulder    Baseline  unable at eval    Time  12    Period  Weeks    Status  New    Target Date  05/14/17            Plan - 03/18/17 1152    Clinical Impression Statement  Pt has been released without restriction but told not to lift at work. Pt tolerated addition on  strengthening exercises well today without c/o increased soreness. Advised that she will likely be sore tomorrow. Added strengthening to HEP.     PT Treatment/Interventions  ADLs/Self Care Home Management;Cryotherapy;Electrical Stimulation;Functional mobility training;Traction;Moist Heat;Therapeutic activities;Therapeutic exercise;Neuromuscular re-education;Patient/family education;Passive range of motion;Manual techniques;Dry needling;Taping    PT Next Visit Plan  incorportate strengthening as tolerated, PROM with end range mobs    PT Home Exercise Plan  median nerve glide, stretches: upper trap, levator, scalene; Supine chest press, pullovers, ER with wand; sidelying ER & abd, row & punch yellow tband    Consulted and Agree with Plan of Care  Patient       Patient will benefit from skilled therapeutic intervention in order to improve the following deficits and impairments:  Decreased range of motion, Impaired UE functional use, Increased muscle spasms, Decreased activity tolerance, Pain, Improper body mechanics, Impaired flexibility, Decreased strength, Postural dysfunction  Visit Diagnosis: Stiffness of right hand, not elsewhere classified  Pain in joints of right hand  Acute pain of left shoulder  Muscle weakness (generalized)     Problem List Patient Active Problem List   Diagnosis Date Noted  . Skier's thumb, right, subsequent encounter 03/15/2017  . Chronic left shoulder pain 02/08/2017  . Nondisplaced fracture of greater tuberosity of left humerus, initial encounter for closed fracture 01/18/2017  . Routine adult health maintenance 09/24/2016  . Hypothyroidism 09/24/2016  Alisha Reed C. Alisha Reed PT, DPT 03/18/17 11:55 AM   [facility]  [place], [place], [address] Phone: [phone]   Fax:  [phone]  Name: Alisha Reed MRN: [id number] Date of Birth: May 05, 1961

## 2017-03-22 ENCOUNTER — Ambulatory Visit: Payer: 59 | Admitting: Physical Therapy

## 2017-03-22 ENCOUNTER — Encounter: Payer: Self-pay | Admitting: Physical Therapy

## 2017-03-22 DIAGNOSIS — M25512 Pain in left shoulder: Secondary | ICD-10-CM

## 2017-03-22 DIAGNOSIS — M25641 Stiffness of right hand, not elsewhere classified: Secondary | ICD-10-CM | POA: Diagnosis not present

## 2017-03-22 DIAGNOSIS — M25541 Pain in joints of right hand: Secondary | ICD-10-CM | POA: Diagnosis not present

## 2017-03-22 DIAGNOSIS — M6281 Muscle weakness (generalized): Secondary | ICD-10-CM

## 2017-03-22 NOTE — Therapy (Signed)
[facility] [place], [place], [address] Phone: [phone]   Fax:  [phone]  Physical Therapy Treatment  Patient Details  Name: Alisha Reed MRN: [id number] Date of Birth: 05-21-1960 Referring Provider: N. Eduard Roux, MD   Encounter Date: 03/22/2017  PT End of Session - 03/22/17 0801    Visit Number  10    Number of Visits  25    Date for PT Re-Evaluation  05/14/17    Authorization Type  [facility] [employer]    PT Start Time  0800    PT Stop Time  0900    PT Time Calculation (min)  60 min    Activity Tolerance  Patient tolerated treatment well    Behavior During Therapy  [facility] for tasks assessed/performed       Past Medical History:  Diagnosis Date  . Thyroid disease     Past Surgical History:  Procedure Laterality Date  . cholecystectomy    . FOOT SURGERY Bilateral   . HYSTERECTOMY ABDOMINAL WITH SALPINGECTOMY    . NASAL SINUS SURGERY      There were no vitals filed for this visit.  Subjective Assessment - 03/22/17 0801    Subjective  Pt reports her shoulder felt good after last appointment. May have overdone it this weekend.     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  Yes    Pain Score  3     Pain Location  Shoulder    Pain Orientation  Right    Aggravating Factors   taking off jacket    Pain Relieving Factors  rest         [facility] PT Assessment - 03/22/17 0001      AROM   Right Shoulder Flexion  140 Degrees    Right Shoulder ABduction  108 Degrees    Right Shoulder Internal Rotation  -- C3    Right Shoulder External Rotation  -- buttock midline                  [facility] Adult PT Treatment/Exercise - 03/22/17 0001      Shoulder Exercises: Supine   Horizontal ABduction  20 reps yellow    External Rotation  20 reps;Theraband yellow    Flexion  20 reps 1#    Other Supine Exercises  ABCs at 90 flx 1#    Other Supine Exercises   rythmic stabs 3x30s      Shoulder Exercises: Prone   Flexion  15 reps    Extension  15 reps    Horizontal ABduction 1  15 reps thumb up      Shoulder Exercises: Standing   Internal Rotation  15 reps with wand      Shoulder Exercises: Pulleys   Flexion  2 minutes    ABduction  2 minutes      Shoulder Exercises: ROM/Strengthening   UBE (Upper Arm Bike)  3'/3'      Modalities   Modalities  Moist Heat      Moist Heat Therapy   Number Minutes Moist Heat  15 Minutes    Moist Heat Location  Shoulder               PT Short Term Goals - 03/08/17 0809      PT SHORT TERM GOAL #1   Title  Pt will demo PROM in Left GHJ equal to R  Baseline  see flowsheet    Time  4    Period  Weeks    Status  On-going      PT SHORT TERM GOAL #2   Title  Pt will demo AAROM of shoulder without increase in shoulder pain    Baseline  feels good, except ER    Time  4    Period  Weeks    Status  Partially Met      PT SHORT TERM GOAL #3   Title  50% improvement in ability to sleep at night    Baseline  improving     Time  4    Period  Weeks    Status  On-going        PT Long Term Goals - 02/16/17 2119      PT LONG TERM GOAL #1   Title  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 shoulder pain    Baseline  unable at eval    Time  12    Period  Weeks    Status  New    Target Date  05/14/17      PT LONG TERM GOAL #2   Title  Pt will be able to return to riding bike without increase in shoulder pain for continued exercise    Baseline  unable at eval    Time  12    Period  Weeks    Status  New    Target Date  05/14/17      PT LONG TERM GOAL #3   Title  FOTO to 39% limitation to indicate significant improvement in functional ability    Baseline  76% limitation at eval    Time  12    Period  Weeks    Status  New    Target Date  05/14/17      PT LONG TERM GOAL #4   Title  Pt will be able to complete all self care and household chores without  limitation by shoulder    Baseline  unable at eval    Time  12    Period  Weeks    Status  New    Target Date  05/14/17            Plan - 03/22/17 0907    Clinical Impression Statement  Fatigue with exercises without pain. Reported feeling stretch at end ranges. Measurements taken at end of session when pt was fatigued but was able to move through ROM without compensation patterns    PT Treatment/Interventions  ADLs/Self Care Home Management;Cryotherapy;Electrical Stimulation;Functional mobility training;Traction;Moist Heat;Therapeutic activities;Therapeutic exercise;Neuromuscular re-education;Patient/family education;Passive range of motion;Manual techniques;Dry needling;Taping    PT Next Visit Plan  manual PRN, increase weight as tolerated    PT Home Exercise Plan  median nerve glide, stretches: upper trap, levator, scalene; Supine chest press, pullovers, ER with wand; sidelying ER & abd, row & punch yellow tband; IR wand behind back       Patient will benefit from skilled therapeutic intervention in order to improve the following deficits and impairments:  Decreased range of motion, Impaired UE functional use, Increased muscle spasms, Decreased activity tolerance, Pain, Improper body mechanics, Impaired flexibility, Decreased strength, Postural dysfunction  Visit Diagnosis: Acute pain of left shoulder  Muscle weakness (generalized)     Problem List Patient Active Problem List   Diagnosis Date Noted  . Skier's thumb, right, subsequent encounter 03/15/2017  . Chronic left shoulder pain  02/08/2017  . Nondisplaced fracture of greater tuberosity of left humerus, initial encounter for closed fracture 01/18/2017  . Routine adult health maintenance 09/24/2016  . Hypothyroidism 09/24/2016   Derika Eckles C. Leota Maka PT, DPT 03/22/17 9:31 AM   [facility] [address] [place], [place], [address] Phone: [phone]   Fax:   [phone]  Name: Liam Bossman MRN: [id number] Date of Birth: 02/23/1961

## 2017-03-23 ENCOUNTER — Encounter: Payer: 59 | Admitting: Occupational Therapy

## 2017-03-24 ENCOUNTER — Ambulatory Visit: Payer: 59 | Admitting: Physical Therapy

## 2017-03-24 ENCOUNTER — Encounter: Payer: Self-pay | Admitting: Physical Therapy

## 2017-03-24 DIAGNOSIS — M6281 Muscle weakness (generalized): Secondary | ICD-10-CM

## 2017-03-24 DIAGNOSIS — M25641 Stiffness of right hand, not elsewhere classified: Secondary | ICD-10-CM

## 2017-03-24 DIAGNOSIS — M25541 Pain in joints of right hand: Secondary | ICD-10-CM | POA: Diagnosis not present

## 2017-03-24 DIAGNOSIS — M25512 Pain in left shoulder: Secondary | ICD-10-CM

## 2017-03-24 NOTE — Therapy (Signed)
[facility] [place], [place], [address] Phone: [phone]   Fax:  [phone]  Physical Therapy Treatment  Patient Details  Name: Alisha Reed MRN: [id number] Date of Birth: 1961-02-20 Referring Provider: N. Eduard Roux, MD   Encounter Date: 03/24/2017  PT End of Session - 03/24/17 0719    Visit Number  11    Number of Visits  25    Date for PT Re-Evaluation  05/14/17    Authorization Type  [facility]    PT Start Time  0715    PT Stop Time  0810    PT Time Calculation (min)  55 min       Past Medical History:  Diagnosis Date  . Thyroid disease     Past Surgical History:  Procedure Laterality Date  . cholecystectomy    . FOOT SURGERY Bilateral   . HYSTERECTOMY ABDOMINAL WITH SALPINGECTOMY    . NASAL SINUS SURGERY      There were no vitals filed for this visit.  Subjective Assessment - 03/24/17 0718    Subjective  A little more achey today.     Currently in Pain?  Yes    Pain Score  3     Pain Location  Shoulder    Pain Orientation  Right    Pain Descriptors / Indicators  Aching    Pain Type  Acute pain                      [facility] Adult PT Treatment/Exercise - 03/24/17 0001      Shoulder Exercises: Supine   Protraction  10 reps 1#    Horizontal ABduction  20 reps yellow    External Rotation  20 reps;Theraband yellow    Flexion  20 reps 1#    Other Supine Exercises  ABCs at 90 flx 1#    Other Supine Exercises  rythmic stabs 3x30s      Shoulder Exercises: Prone   Flexion  15 reps    Extension  15 reps    Horizontal ABduction 1  15 reps thumb up      Shoulder Exercises: Sidelying   External Rotation  20 reps    ABduction  20 reps      Shoulder Exercises: Standing   Internal Rotation  15 reps with cane     Flexion  15 reps bilateral with cane (pullover)    Extension  15 reps red    Row  15 reps red      Shoulder Exercises: Pulleys   Flexion  2 minutes    ABduction  2  minutes      Shoulder Exercises: ROM/Strengthening   UBE (Upper Arm Bike)  3'/3'      Cryotherapy   Number Minutes Cryotherapy  10 Minutes    Cryotherapy Location  Shoulder    Type of Cryotherapy  Ice pack               PT Short Term Goals - 03/24/17 6734      PT SHORT TERM GOAL #1   Title  Pt will demo PROM in Left GHJ equal to R    Baseline  see flowsheet    Time  4    Period  Weeks    Status  On-going      PT SHORT TERM GOAL #2   Title  Pt will demo AAROM of shoulder without increase in shoulder pain    Baseline  feels good, except ER    Time  4    Period  Weeks    Status  Partially Met      PT SHORT TERM GOAL #3   Title  50% improvement in ability to sleep at night    Baseline  much better    Time  4    Period  Weeks    Status  Achieved        PT Long Term Goals - 02/16/17 6073      PT LONG TERM GOAL #1   Title  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 shoulder pain    Baseline  unable at eval    Time  12    Period  Weeks    Status  New    Target Date  05/14/17      PT LONG TERM GOAL #2   Title  Pt will be able to return to riding bike without increase in shoulder pain for continued exercise    Baseline  unable at eval    Time  12    Period  Weeks    Status  New    Target Date  05/14/17      PT LONG TERM GOAL #3   Title  FOTO to 39% limitation to indicate significant improvement in functional ability    Baseline  76% limitation at eval    Time  12    Period  Weeks    Status  New    Target Date  05/14/17      PT LONG TERM GOAL #4   Title  Pt will be able to complete all self care and household chores without limitation by shoulder    Baseline  unable at eval    Time  12    Period  Weeks    Status  New    Target Date  05/14/17            Plan - 03/24/17 0758    Clinical Impression Statement  Continued with ROM and gentle strengthening. Pain with sidelying abduction and ER so did not add weight  today. Pt received injection in shoulder at last MD visit and reports no dificulty sleeping STG# 3 met. Continues to have pain with ER AAROM STG#2 partially met.     PT Next Visit Plan  manual PRN, increase weight as tolerated    PT Home Exercise Plan  median nerve glide, stretches: upper trap, levator, scalene; Supine chest press, pullovers, ER with wand; sidelying ER & abd, row & punch yellow tband; IR wand behind back    Consulted and Agree with Plan of Care  Patient       Patient will benefit from skilled therapeutic intervention in order to improve the following deficits and impairments:  Decreased range of motion, Impaired UE functional use, Increased muscle spasms, Decreased activity tolerance, Pain, Improper body mechanics, Impaired flexibility, Decreased strength, Postural dysfunction  Visit Diagnosis: Acute pain of left shoulder  Muscle weakness (generalized)  Stiffness of right hand, not elsewhere classified  Pain in joints of right hand     Problem List Patient Active Problem List   Diagnosis Date Noted  . Skier's thumb, right, subsequent encounter 03/15/2017  . Chronic left shoulder pain 02/08/2017  . Nondisplaced fracture of greater tuberosity of left humerus, initial encounter for closed fracture 01/18/2017  . Routine adult health maintenance 09/24/2016  . Hypothyroidism 09/24/2016    Hessie Diener  [place], PTA 03/24/2017, [facility] [place], [place], [address] Phone: [phone]   Fax:  [phone]  Name: Alisha Reed MRN: [id number] Date of Birth: 07-Aug-1960

## 2017-03-29 ENCOUNTER — Encounter: Payer: Self-pay | Admitting: Physical Therapy

## 2017-03-29 ENCOUNTER — Ambulatory Visit: Payer: 59 | Admitting: Physical Therapy

## 2017-03-29 ENCOUNTER — Ambulatory Visit: Payer: 59 | Admitting: Occupational Therapy

## 2017-03-29 DIAGNOSIS — M6281 Muscle weakness (generalized): Secondary | ICD-10-CM | POA: Diagnosis not present

## 2017-03-29 DIAGNOSIS — M25541 Pain in joints of right hand: Secondary | ICD-10-CM | POA: Diagnosis not present

## 2017-03-29 DIAGNOSIS — M25512 Pain in left shoulder: Secondary | ICD-10-CM

## 2017-03-29 DIAGNOSIS — M25641 Stiffness of right hand, not elsewhere classified: Secondary | ICD-10-CM | POA: Diagnosis not present

## 2017-03-29 MED FILL — CELECOXIB 200 MG CAPS: 200 | 15 days supply | Qty: 30 | Fill #1

## 2017-03-29 MED FILL — VENLAFAXINE HCL ER 150 MG C: 150 | 30 days supply | Qty: 30 | Fill #2

## 2017-03-29 NOTE — Therapy (Signed)
[place], [place], [address] Phone: [phone]   Fax:  [phone]  Physical Therapy Treatment  Patient Details  Name: Alisha Reed MRN: [id number] Date of Birth: 14-Feb-1961 Referring Provider: N. Eduard Roux, MD   Encounter Date: 03/29/2017  PT End of Session - 03/29/17 0753    Visit Number  12    Number of Visits  25    Date for PT Re-Evaluation  05/14/17    Authorization Type  [employer]    PT Start Time  0800    PT Stop Time  0843    PT Time Calculation (min)  43 min    Activity Tolerance  Patient tolerated treatment well    Behavior During Therapy  [facility] for tasks assessed/performed       Past Medical History:  Diagnosis Date  . Thyroid disease     Past Surgical History:  Procedure Laterality Date  . cholecystectomy    . FOOT SURGERY Bilateral   . HYSTERECTOMY ABDOMINAL WITH SALPINGECTOMY    . NASAL SINUS SURGERY      There were no vitals filed for this visit.  Subjective Assessment - 03/29/17 0801    Subjective  Not really noticing any soreness. Sometimes I forget because it doesn't hurt.     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  No/denies                      [facility] Adult PT Treatment/Exercise - 03/29/17 0001      Shoulder Exercises: Supine   Flexion  20 reps    Flexion Limitations  2lb supine on elevated table    Other Supine Exercises  ABCs 90 flx 2# on elevated table      Shoulder Exercises: Prone   Extension  15 reps    Other Prone Exercises  row 2#      Shoulder Exercises: Standing   Protraction  15 reps standing on elbows    Horizontal ABduction  10 reps red tband    Other Standing Exercises  body blade 2x30s: IR/ER, flx/ext at 90, abd/add    Other Standing Exercises  CKC on table-rocking & circles      Shoulder Exercises: ROM/Strengthening   UBE (Upper Arm Bike)  2'/2'      Manual  Therapy   Joint Mobilization  gross rib ER, prone scapular mobs, sidelying scapular distraction    Soft tissue mobilization  L lower thoracic paraspinals               PT Short Term Goals - 03/24/17 9407      PT SHORT TERM GOAL #1   Title  Pt will demo PROM in Left GHJ equal to R    Baseline  see flowsheet    Time  4    Period  Weeks    Status  On-going      PT SHORT TERM GOAL #2   Title  Pt will demo AAROM of shoulder without increase in shoulder pain    Baseline  feels good, except ER    Time  4    Period  Weeks    Status  Partially Met      PT SHORT TERM GOAL #3   Title  50% improvement in ability to sleep at night    Baseline  much better    Time  4  Period  Weeks    Status  Achieved        PT Long Term Goals - 02/16/17 0939      PT LONG TERM GOAL #1   Title  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 shoulder pain    Baseline  unable at eval    Time  12    Period  Weeks    Status  New    Target Date  05/14/17      PT LONG TERM GOAL #2   Title  Pt will be able to return to riding bike without increase in shoulder pain for continued exercise    Baseline  unable at eval    Time  12    Period  Weeks    Status  New    Target Date  05/14/17      PT LONG TERM GOAL #3   Title  FOTO to 39% limitation to indicate significant improvement in functional ability    Baseline  76% limitation at eval    Time  12    Period  Weeks    Status  New    Target Date  05/14/17      PT LONG TERM GOAL #4   Title  Pt will be able to complete all self care and household chores without limitation by shoulder    Baseline  unable at eval    Time  12    Period  Weeks    Status  New    Target Date  05/14/17            Plan - 03/29/17 0847    Clinical Impression Statement  Added controlled weight bearing today which pt tolerated well, cues to maintain scapular depression. Notable limitation in scapular and rib cage mobility,  addressed with manual today.     PT Treatment/Interventions  ADLs/Self Care Home Management;Cryotherapy;Electrical Stimulation;Functional mobility training;Traction;Moist Heat;Therapeutic activities;Therapeutic exercise;Neuromuscular re-education;Patient/family education;Passive range of motion;Manual techniques;Dry needling;Taping    PT Next Visit Plan  manual PRN, increase weight as tolerated, incr WB gently    PT Home Exercise Plan  median nerve glide, stretches: upper trap, levator, scalene; Supine chest press, pullovers, ER with wand; sidelying ER & abd, row & punch yellow tband; IR wand behind back; serratus press, CKC on table circles/rocking, horiz abd red tband    Consulted and Agree with Plan of Care  Patient       Patient will benefit from skilled therapeutic intervention in order to improve the following deficits and impairments:  Decreased range of motion, Impaired UE functional use, Increased muscle spasms, Decreased activity tolerance, Pain, Improper body mechanics, Impaired flexibility, Decreased strength, Postural dysfunction  Visit Diagnosis: Acute pain of left shoulder  Muscle weakness (generalized)     Problem List Patient Active Problem List   Diagnosis Date Noted  . Skier's thumb, right, subsequent encounter 03/15/2017  . Chronic left shoulder pain 02/08/2017  . Nondisplaced fracture of greater tuberosity of left humerus, initial encounter for closed fracture 01/18/2017  . Routine adult health maintenance 09/24/2016  . Hypothyroidism 09/24/2016    Alisha Reed PT, DPT 03/29/17 8:54 AM   [name] [address] [place], [place], [address] Phone: [phone]   Fax:  [phone]  Name: Alisha Reed MRN: [id number] Date of Birth: August 07, 1960

## 2017-03-30 ENCOUNTER — Encounter: Payer: Self-pay | Admitting: Physical Therapy

## 2017-03-30 ENCOUNTER — Ambulatory Visit: Payer: 59 | Admitting: Physical Therapy

## 2017-03-30 DIAGNOSIS — M25641 Stiffness of right hand, not elsewhere classified: Secondary | ICD-10-CM | POA: Diagnosis not present

## 2017-03-30 DIAGNOSIS — M25541 Pain in joints of right hand: Secondary | ICD-10-CM | POA: Diagnosis not present

## 2017-03-30 DIAGNOSIS — M25512 Pain in left shoulder: Secondary | ICD-10-CM

## 2017-03-30 DIAGNOSIS — M6281 Muscle weakness (generalized): Secondary | ICD-10-CM

## 2017-03-30 NOTE — Therapy (Signed)
[place], [place], [address] Phone: [phone]   Fax:  [phone]  Physical Therapy Treatment  Patient Details  Name: Alisha Reed MRN: [id number] Date of Birth: 1960/11/10 Referring Provider: N. Eduard Roux, MD   Encounter Date: 03/30/2017  PT End of Session - 03/30/17 0800    Visit Number  13    Number of Visits  25    Date for PT Re-Evaluation  05/14/17    Authorization Type  [facility] [employer]    PT Start Time  0800    PT Stop Time  0840    PT Time Calculation (min)  40 min    Activity Tolerance  Patient tolerated treatment well    Behavior During Therapy  [facility] for tasks assessed/performed       Past Medical History:  Diagnosis Date  . Thyroid disease     Past Surgical History:  Procedure Laterality Date  . cholecystectomy    . FOOT SURGERY Bilateral   . HYSTERECTOMY ABDOMINAL WITH SALPINGECTOMY    . NASAL SINUS SURGERY      There were no vitals filed for this visit.  Subjective Assessment - 03/30/17 0800    Subjective  Had some back spasms on Lt side yesterday, is getting a massage today.     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  No/denies                      [facility] Adult PT Treatment/Exercise - 03/30/17 0001      Shoulder Exercises: Seated   Other Seated Exercises  biceps curls 3#      Shoulder Exercises: Prone   Flexion  15 reps pillow under hips      Shoulder Exercises: Standing   Flexion  15 reps red with elbow extended    ABduction  15 reps red, elbow extended    Extension  15 reps red, elbow extended    Other Standing Exercises  upright row to ER red tband    Other Standing Exercises  wall walks yellow tband      Shoulder Exercises: ROM/Strengthening   UBE (Upper Arm Bike)  2'/2'      Shoulder Exercises: Stretch   Wall Stretch - Flexion  10 seconds    Wall Stretch - ABduction  10 seconds    Table Stretch - Flexion  2 reps;20 seconds    Other Shoulder Stretches  wall rainbows               PT Short Term Goals - 03/24/17 0811      PT SHORT TERM GOAL #1   Title  Pt will demo PROM in Left GHJ equal to R    Baseline  see flowsheet    Time  4    Period  Weeks    Status  On-going      PT SHORT TERM GOAL #2   Title  Pt will demo AAROM of shoulder without increase in shoulder pain    Baseline  feels good, except ER    Time  4    Period  Weeks    Status  Partially Met      PT SHORT TERM GOAL #3   Title  50% improvement in ability to sleep at night    Baseline  much better    Time  4    Period  Weeks  Status  Achieved        PT Long Term Goals - 02/16/17 0939      PT LONG TERM GOAL #1   Title  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 shoulder pain    Baseline  unable at eval    Time  12    Period  Weeks    Status  New    Target Date  05/14/17      PT LONG TERM GOAL #2   Title  Pt will be able to return to riding bike without increase in shoulder pain for continued exercise    Baseline  unable at eval    Time  12    Period  Weeks    Status  New    Target Date  05/14/17      PT LONG TERM GOAL #3   Title  FOTO to 39% limitation to indicate significant improvement in functional ability    Baseline  76% limitation at eval    Time  12    Period  Weeks    Status  New    Target Date  05/14/17      PT LONG TERM GOAL #4   Title  Pt will be able to complete all self care and household chores without limitation by shoulder    Baseline  unable at eval    Time  12    Period  Weeks    Status  New    Target Date  05/14/17            Plan - 03/30/17 0811    Clinical Impression Statement  long axis resistance band exercises to challenge control. re-addressed importance of stretching with exercises. Good tolerance to exercise with cues for posture and reducing GHJ elevation.     PT Treatment/Interventions  ADLs/Self  Care Home Management;Cryotherapy;Electrical Stimulation;Functional mobility training;Traction;Moist Heat;Therapeutic activities;Therapeutic exercise;Neuromuscular re-education;Patient/family education;Passive range of motion;Manual techniques;Dry needling;Taping    PT Next Visit Plan  manual PRN, increase weight as tolerated, incr WB gently    PT Home Exercise Plan  median nerve glide, stretches: upper trap, levator, scalene; Supine chest press, pullovers, ER with wand; sidelying ER & abd, row & punch yellow tband; IR wand behind back; serratus press, CKC on table circles/rocking, horiz abd red tband; wall walks yellow tband, prone flx & abd,        Patient will benefit from skilled therapeutic intervention in order to improve the following deficits and impairments:  Decreased range of motion, Impaired UE functional use, Increased muscle spasms, Decreased activity tolerance, Pain, Improper body mechanics, Impaired flexibility, Decreased strength, Postural dysfunction  Visit Diagnosis: Acute pain of left shoulder  Muscle weakness (generalized)     Problem List Patient Active Problem List   Diagnosis Date Noted  . Skier's thumb, right, subsequent encounter 03/15/2017  . Chronic left shoulder pain 02/08/2017  . Nondisplaced fracture of greater tuberosity of left humerus, initial encounter for closed fracture 01/18/2017  . Routine adult health maintenance 09/24/2016  . Hypothyroidism 09/24/2016    Harmonee Tozer C. Shaniece Bussa PT, DPT 03/30/17 8:43 AM   [place] [facility] [address] [place], [place], [address] Phone: [phone]   Fax:  [phone]  Name: Aftyn Nott MRN: [id number] Date of Birth: 09/27/1960

## 2017-04-06 ENCOUNTER — Ambulatory Visit: Payer: 59 | Admitting: Physical Therapy

## 2017-04-06 DIAGNOSIS — M6281 Muscle weakness (generalized): Secondary | ICD-10-CM | POA: Diagnosis not present

## 2017-04-06 DIAGNOSIS — M25512 Pain in left shoulder: Secondary | ICD-10-CM | POA: Diagnosis not present

## 2017-04-06 DIAGNOSIS — M25641 Stiffness of right hand, not elsewhere classified: Secondary | ICD-10-CM

## 2017-04-06 DIAGNOSIS — M25541 Pain in joints of right hand: Secondary | ICD-10-CM

## 2017-04-07 ENCOUNTER — Encounter: Payer: Self-pay | Admitting: Physical Therapy

## 2017-04-07 NOTE — Therapy (Signed)
[place], [place], [address] Phone: [phone]   Fax:  [phone]  Physical Therapy Treatment  Patient Details  Name: Alisha Reed MRN: [id number] Date of Birth: 12/15/1960 Referring Provider: N. Eduard Roux, MD   Encounter Date: 04/06/2017  PT End of Session - 04/06/17 0838    Visit Number  14    Number of Visits  25    Date for PT Re-Evaluation  05/14/17    Authorization Type  [facility]    PT Start Time  0715    PT Stop Time  0810    PT Time Calculation (min)  55 min       Past Medical History:  Diagnosis Date  . Thyroid disease     Past Surgical History:  Procedure Laterality Date  . cholecystectomy    . FOOT SURGERY Bilateral   . HYSTERECTOMY ABDOMINAL WITH SALPINGECTOMY    . NASAL SINUS SURGERY      There were no vitals filed for this visit.  Subjective Assessment - 04/07/17 0722    Subjective  Had a massage and felt strange the next day.     Currently in Pain?  No/denies                      [facility] Adult PT Treatment/Exercise - 04/06/17 0001      Shoulder Exercises: Seated   Other Seated Exercises  biceps curls 3#      Shoulder Exercises: Prone   Flexion  15 reps pillow under hips    Extension  15 reps 2#    Other Prone Exercises  row 2#      Shoulder Exercises: Sidelying   External Rotation  20 reps    ABduction  20 reps      Shoulder Exercises: Standing   Flexion  15 reps red with elbow extended    ABduction  15 reps red, elbow extended    Extension  15 reps red, elbow extended    Other Standing Exercises  CKC on table-rocking & circles      Shoulder Exercises: Pulleys   Flexion  2 minutes    ABduction  2 minutes      Shoulder Exercises: ROM/Strengthening   UBE (Upper Arm Bike)  2'/2'      Shoulder Exercises: Stretch   Wall Stretch - Flexion  10 seconds               PT Short Term Goals - 03/24/17 0811      PT SHORT TERM GOAL #1   Title  Pt will demo PROM in Left GHJ equal to R    Baseline  see flowsheet    Time  4    Period  Weeks    Status  On-going      PT SHORT TERM GOAL #2   Title  Pt will demo AAROM of shoulder without increase in shoulder pain    Baseline  feels good, except ER    Time  4    Period  Weeks    Status  Partially Met      PT SHORT TERM GOAL #3   Title  50% improvement in ability to sleep at night    Baseline  much better    Time  4    Period  Weeks    Status  Achieved        PT Long Term Goals - 02/16/17 3845  PT LONG TERM GOAL #1   Title  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 shoulder pain    Baseline  unable at eval    Time  12    Period  Weeks    Status  New    Target Date  05/14/17      PT LONG TERM GOAL #2   Title  Pt will be able to return to riding bike without increase in shoulder pain for continued exercise    Baseline  unable at eval    Time  12    Period  Weeks    Status  New    Target Date  05/14/17      PT LONG TERM GOAL #3   Title  FOTO to 39% limitation to indicate significant improvement in functional ability    Baseline  76% limitation at eval    Time  12    Period  Weeks    Status  New    Target Date  05/14/17      PT LONG TERM GOAL #4   Title  Pt will be able to complete all self care and household chores without limitation by shoulder    Baseline  unable at eval    Time  12    Period  Weeks    Status  New    Target Date  05/14/17            Plan - 04/06/17 2257    Clinical Impression Statement  Reviewed recent HEP and continued strengthening with good tolerance.     PT Next Visit Plan  manual PRN, increase weight as tolerated, incr WB gently    PT Home Exercise Plan  median nerve glide, stretches: upper trap, levator, scalene; Supine chest press, pullovers, ER with wand; sidelying ER & abd, row & punch yellow tband; IR wand behind back; serratus press, CKC on table circles/rocking, horiz abd red  tband; wall walks yellow tband, prone flx & abd,     Consulted and Agree with Plan of Care  Patient       Patient will benefit from skilled therapeutic intervention in order to improve the following deficits and impairments:  Decreased range of motion, Impaired UE functional use, Increased muscle spasms, Decreased activity tolerance, Pain, Improper body mechanics, Impaired flexibility, Decreased strength, Postural dysfunction  Visit Diagnosis: Acute pain of left shoulder  Muscle weakness (generalized)  Stiffness of right hand, not elsewhere classified  Pain in joints of right hand     Problem List Patient Active Problem List   Diagnosis Date Noted  . Skier's thumb, right, subsequent encounter 03/15/2017  . Chronic left shoulder pain 02/08/2017  . Nondisplaced fracture of greater tuberosity of left humerus, initial encounter for closed fracture 01/18/2017  . Routine adult health maintenance 09/24/2016  . Hypothyroidism 09/24/2016    Dorene Ar, PTA 04/07/2017, 7:23 AM  [place], [place], [address] Phone: [phone]   Fax:  [phone]  Name: Alisha Reed MRN: [id number] Date of Birth: 1961/01/22

## 2017-04-08 ENCOUNTER — Ambulatory Visit: Payer: 59 | Admitting: Physical Therapy

## 2017-04-08 ENCOUNTER — Encounter: Payer: Self-pay | Admitting: Physical Therapy

## 2017-04-08 ENCOUNTER — Telehealth: Payer: Self-pay | Admitting: Family

## 2017-04-08 DIAGNOSIS — M25512 Pain in left shoulder: Secondary | ICD-10-CM

## 2017-04-08 DIAGNOSIS — M25541 Pain in joints of right hand: Secondary | ICD-10-CM | POA: Diagnosis not present

## 2017-04-08 DIAGNOSIS — M25641 Stiffness of right hand, not elsewhere classified: Secondary | ICD-10-CM | POA: Diagnosis not present

## 2017-04-08 DIAGNOSIS — M6281 Muscle weakness (generalized): Secondary | ICD-10-CM

## 2017-04-08 MED ORDER — LEVOTHYROXINE SODIUM 75 MCG PO TABS: 75.0000 ug | ORAL_TABLET | Freq: Every day | ORAL | 0 refills | Status: DC

## 2017-04-08 MED ORDER — LEVOTHYROXINE SODIUM 75 MCG PO TABS: 75.0000 ug | ORAL_TABLET | Freq: Every day | ORAL | 1 refills | Status: DC

## 2017-04-08 MED FILL — LEVOTHYROXINE 75 MCG TABLET: 75 | 90 days supply | Qty: 90 | Fill #0

## 2017-04-08 NOTE — Therapy (Signed)
[place], [place], [address] Phone: [phone]   Fax:  [phone]  Physical Therapy Treatment  Patient Details  Name: Alisha Reed MRN: [id number] Date of Birth: 04/20/1961 Referring Provider: N. Eduard Roux, MD   Encounter Date: 04/08/2017  PT End of Session - 04/08/17 0720    Visit Number  15    Number of Visits  25    Date for PT Re-Evaluation  05/14/17    Authorization Type  [facility]    PT Start Time  0715    PT Stop Time  0815    PT Time Calculation (min)  60 min       Past Medical History:  Diagnosis Date  . Thyroid disease     Past Surgical History:  Procedure Laterality Date  . cholecystectomy    . FOOT SURGERY Bilateral   . HYSTERECTOMY ABDOMINAL WITH SALPINGECTOMY    . NASAL SINUS SURGERY      There were no vitals filed for this visit.  Subjective Assessment - 04/07/17 0722    Subjective  Had a massage and felt strange the next day.     Currently in Pain?  No/denies         [facility] PT Assessment - 04/08/17 0001      PROM   Left Shoulder Flexion  145 Degrees    Left Shoulder ABduction  150 Degrees    Left Shoulder Internal Rotation  65 Degrees    Left Shoulder External Rotation  32 Degrees                  [facility] Adult PT Treatment/Exercise - 04/08/17 0001      Shoulder Exercises: Prone   Extension  15 reps 2#    Horizontal ABduction 1  15 reps thumb up    Other Prone Exercises  row 2#      Shoulder Exercises: Sidelying   External Rotation  15 reps 1#    ABduction  20 reps      Shoulder Exercises: Standing   External Rotation  15 reps;Theraband    Theraband Level (Shoulder External Rotation)  Level 1 (Yellow)    Internal Rotation  15 reps;Theraband    Theraband Level (Shoulder Internal Rotation)  Level 2 (Red)    Other Standing Exercises  wall walksred tband      Shoulder Exercises: Pulleys   Flexion  2 minutes    ABduction  2 minutes      Shoulder  Exercises: ROM/Strengthening   UBE (Upper Arm Bike)  2'/2', level 2       Cryotherapy   Number Minutes Cryotherapy  15 Minutes    Cryotherapy Location  Shoulder    Type of Cryotherapy  Ice pack               PT Short Term Goals - 04/08/17 0731      PT SHORT TERM GOAL #1   Title  Pt will demo PROM in Left GHJ equal to R    Baseline  see flowsheet    Time  4    Period  Weeks    Status  On-going      PT SHORT TERM GOAL #2   Title  Pt will demo AAROM of shoulder without increase in shoulder pain    Baseline  no pain with AAROM    Time  4    Period  Weeks    Status  Achieved  PT SHORT TERM GOAL #3   Title  50% improvement in ability to sleep at night    Time  4    Period  Weeks    Status  Achieved        PT Long Term Goals - 02/16/17 3244      PT LONG TERM GOAL #1   Title  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 shoulder pain    Baseline  unable at eval    Time  12    Period  Weeks    Status  New    Target Date  05/14/17      PT LONG TERM GOAL #2   Title  Pt will be able to return to riding bike without increase in shoulder pain for continued exercise    Baseline  unable at eval    Time  12    Period  Weeks    Status  New    Target Date  05/14/17      PT LONG TERM GOAL #3   Title  FOTO to 39% limitation to indicate significant improvement in functional ability    Baseline  76% limitation at eval    Time  12    Period  Weeks    Status  New    Target Date  05/14/17      PT LONG TERM GOAL #4   Title  Pt will be able to complete all self care and household chores without limitation by shoulder    Baseline  unable at eval    Time  12    Period  Weeks    Status  New    Target Date  05/14/17            Plan - 04/08/17 0914    Clinical Impression Statement  AAROM tolerated well without pain. STG# 2 met. PROM limited compared to RUE. Pain with end range with all PROM. Began therabands for standing IR/ER and  added resistance to sidelying ER with no increased pain, only fatigue.     PT Next Visit Plan  ? add ER stretch? manual PRN, increase weight as tolerated, incr WB gently    PT Home Exercise Plan  median nerve glide, stretches: upper trap, levator, scalene; Supine chest press, pullovers, ER with wand; sidelying ER & abd, row & punch yellow tband; IR wand behind back; serratus press, CKC on table circles/rocking, horiz abd red tband; wall walks yellow tband, prone flx & abd,     Consulted and Agree with Plan of Care  Patient       Patient will benefit from skilled therapeutic intervention in order to improve the following deficits and impairments:  Decreased range of motion, Impaired UE functional use, Increased muscle spasms, Decreased activity tolerance, Pain, Improper body mechanics, Impaired flexibility, Decreased strength, Postural dysfunction  Visit Diagnosis: Acute pain of left shoulder  Muscle weakness (generalized)  Stiffness of right hand, not elsewhere classified  Pain in joints of right hand     Problem List Patient Active Problem List   Diagnosis Date Noted  . Skier's thumb, right, subsequent encounter 03/15/2017  . Chronic left shoulder pain 02/08/2017  . Nondisplaced fracture of greater tuberosity of left humerus, initial encounter for closed fracture 01/18/2017  . Routine adult health maintenance 09/24/2016  . Hypothyroidism 09/24/2016    Dorene Ar, PTA 04/08/2017, 9:29 AM  [place],  [place], [address] Phone: [phone]   Fax:  [phone]  Name: Alisha Reed MRN: [id number] Date of Birth: 12-May-1960

## 2017-04-08 NOTE — Telephone Encounter (Signed)
Notified [facility] [place], and another rx has been sent to [facility]...Alisha Reed

## 2017-04-08 NOTE — Telephone Encounter (Signed)
Appt with Ashleigh set up for 09/2017

## 2017-04-08 NOTE — Telephone Encounter (Signed)
Copied from [place] [id number]. Topic: Quick Communication - See Telephone Encounter >> Apr 08, 2017  9:37 AM Ivar Drape wrote: CRM for notification. See Telephone encounter for:  04/08/17. Patient expressed that the Outpatient pharmacy sent in a request for a refill of her Synthroid medication but they have not heard anything back from the office.  She is requesting a refill of this medication.  She also said at this point she is going to run out of medication before she get's back into town.  She lives in [place], [place].  So she would like a part of the medication to be [facility] into [facility] [address] in [place], [place].  The phone number is [phone].  Patient said she would be back in town on Monday and can pick up the remainder of the prescription then.

## 2017-04-12 ENCOUNTER — Ambulatory Visit: Payer: 59 | Attending: Orthopaedic Surgery | Admitting: Physical Therapy

## 2017-04-12 ENCOUNTER — Ambulatory Visit: Payer: 59 | Admitting: Occupational Therapy

## 2017-04-12 DIAGNOSIS — M25512 Pain in left shoulder: Secondary | ICD-10-CM | POA: Diagnosis not present

## 2017-04-12 DIAGNOSIS — M25641 Stiffness of right hand, not elsewhere classified: Secondary | ICD-10-CM

## 2017-04-12 DIAGNOSIS — M6281 Muscle weakness (generalized): Secondary | ICD-10-CM

## 2017-04-12 DIAGNOSIS — M25541 Pain in joints of right hand: Secondary | ICD-10-CM | POA: Diagnosis not present

## 2017-04-12 NOTE — Patient Instructions (Addendum)
1. Grip Strengthening (Resistive Putty)   Squeeze putty using thumb and all fingers. Repeat _15___ times. Do __2__ sessions per day.   2. Roll putty into tube on table and pinch between  index and long finger, and thumb x 10 reps each.  Do 2 sessions per day    3. Lateral Pinch Strengthening (Resistive Putty)    Squeeze between thumb and side of index finger in turn. Repeat _10___ times. Do _2___ sessions per day.

## 2017-04-12 NOTE — Therapy (Signed)
[place], [place], [address] Phone: [phone]   Fax:  [phone]  Physical Therapy Treatment  Patient Details  Name: Alisha Reed MRN: [id number] Date of Birth: [birth year] Referring Provider: N. Eduard Roux, MD   Encounter Date: 04/12/2017  PT End of Session - 04/12/17 0719    Visit Number  16    Number of Visits  25    Date for PT Re-Evaluation  05/14/17    Authorization Type  [employer]    PT Start Time  0715    PT Stop Time  0810    PT Time Calculation (min)  55 min       Past Medical History:  Diagnosis Date  . Thyroid disease     Past Surgical History:  Procedure Laterality Date  . cholecystectomy    . FOOT SURGERY Bilateral   . HYSTERECTOMY ABDOMINAL WITH SALPINGECTOMY    . NASAL SINUS SURGERY      There were no vitals filed for this visit.  Subjective Assessment - 04/12/17 0718    Subjective  Doing good. Cant wait to get back into the OR at work.     Currently in Pain?  No/denies         [facility] PT Assessment - 04/12/17 0001      Observation/Other Assessments   Focus on Therapeutic Outcomes (FOTO)   37% limited                   [facility] Adult PT Treatment/Exercise - 04/12/17 0001      Shoulder Exercises: Supine   Other Supine Exercises  3# clocks 12/6, 3/9      Shoulder Exercises: Seated   Other Seated Exercises  biceps curls 4#      Shoulder Exercises: Prone   Flexion  15 reps    Extension  15 reps 3#    Horizontal ABduction 1  15 reps 1#    Other Prone Exercises  row 3#      Shoulder Exercises: Sidelying   External Rotation  15 reps 1#    ABduction  15 reps 1#      Shoulder Exercises: Standing   External Rotation  15 reps;Theraband    Theraband Level (Shoulder External Rotation)  Level 1 (Yellow)    Internal Rotation  15 reps;Theraband    Theraband Level (Shoulder Internal Rotation)  Level 1 (Yellow)    Other Standing Exercises  cabinet reaching 1#  middle shelf    Other Standing Exercises  wall walksred tband      Shoulder Exercises: Pulleys   Flexion  2 minutes    ABduction  2 minutes      Shoulder Exercises: ROM/Strengthening   UBE (Upper Arm Bike)  2'/2', level 2       Shoulder Exercises: Stretch   Corner Stretch  3 reps;20 seconds    Wall Stretch - Flexion  10 seconds;3 reps    Wall Stretch - ABduction  10 seconds;3 reps      Cryotherapy   Number Minutes Cryotherapy  15 Minutes    Cryotherapy Location  Shoulder    Type of Cryotherapy  Ice pack      Manual Therapy   Passive ROM  flexion, abduction, ER             PT Education - 04/12/17 0801    Education provided  Yes    Education Details  HEP    Person(s) Educated  Patient  Methods  Explanation;Handout    Comprehension  Verbalized understanding       PT Short Term Goals - 04/08/17 0731      PT SHORT TERM GOAL #1   Title  Pt will demo PROM in Left GHJ equal to R    Baseline  see flowsheet    Time  4    Period  Weeks    Status  On-going      PT SHORT TERM GOAL #2   Title  Pt will demo AAROM of shoulder without increase in shoulder pain    Baseline  no pain with AAROM    Time  4    Period  Weeks    Status  Achieved      PT SHORT TERM GOAL #3   Title  50% improvement in ability to sleep at night    Time  4    Period  Weeks    Status  Achieved        PT Long Term Goals - 02/16/17 3818      PT LONG TERM GOAL #1   Title  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 shoulder pain    Baseline  unable at eval    Time  12    Period  Weeks    Status  New    Target Date  05/14/17      PT LONG TERM GOAL #2   Title  Pt will be able to return to riding bike without increase in shoulder pain for continued exercise    Baseline  unable at eval    Time  12    Period  Weeks    Status  New    Target Date  05/14/17      PT LONG TERM GOAL #3   Title  FOTO to 39% limitation to indicate significant improvement in  functional ability    Baseline  76% limitation at eval    Time  12    Period  Weeks    Status  New    Target Date  05/14/17      PT LONG TERM GOAL #4   Title  Pt will be able to complete all self care and household chores without limitation by shoulder    Baseline  unable at eval    Time  12    Period  Weeks    Status  New    Target Date  05/14/17            Plan - 04/12/17 0756    Clinical Impression Statement  Added shouler IR/ER theraband exercises and Er doorway stretch. Pt tolerated well. FOTO score improved to 37% limited.     PT Next Visit Plan  review doorway stretch and IR/ER therabands,  manual PRN, increase weight as tolerated, incr WB gently    PT Home Exercise Plan  median nerve glide, stretches: upper trap, levator, scalene; Supine chest press, pullovers, ER with wand; sidelying ER & abd, row & punch yellow tband; IR wand behind back; serratus press, CKC on table circles/rocking, horiz abd red tband; wall walks yellow tband, prone flx & abd, yellow band IR/ER,  doorway stretch.     Consulted and Agree with Plan of Care  Patient       Patient will benefit from skilled therapeutic intervention in order to improve the following deficits and impairments:  Decreased range of motion, Impaired UE functional use, Increased muscle spasms,  Decreased activity tolerance, Pain, Improper body mechanics, Impaired flexibility, Decreased strength, Postural dysfunction  Visit Diagnosis: Acute pain of left shoulder  Muscle weakness (generalized)  Pain in joints of right hand  Stiffness of right hand, not elsewhere classified     Problem List Patient Active Problem List   Diagnosis Date Noted  . Skier's thumb, right, subsequent encounter 03/15/2017  . Chronic left shoulder pain 02/08/2017  . Nondisplaced fracture of greater tuberosity of left humerus, initial encounter for closed fracture 01/18/2017  . Routine adult health maintenance 09/24/2016  . Hypothyroidism  09/24/2016    Dorene Ar, PTA 04/12/2017, 8:19 AM  [facility] [address] [place], [place], [address] Phone: [phone]   Fax:  [phone]  Name: Delana Manganello MRN: [id number] Date of Birth: November 20, 1960

## 2017-04-12 NOTE — Therapy (Signed)
[facility] [address] [place], [place], [address] Phone: [phone]   Fax:  [phone]  Occupational Therapy Treatment  Patient Details  Name: Alisha Reed MRN: [id number] Date of Birth: [birth year] Referring Provider: Dr. Erlinda Hong   Encounter Date: 04/12/2017  OT End of Session - 04/12/17 0926    Visit Number  4    Number of Visits  12    Authorization Type  [employer] employee, [employer] plan    OT Start Time  0845    OT Stop Time  [phone]    OT Time Calculation (min)  40 min    Activity Tolerance  Patient tolerated treatment well       Past Medical History:  Diagnosis Date  . Thyroid disease     Past Surgical History:  Procedure Laterality Date  . cholecystectomy    . FOOT SURGERY Bilateral   . HYSTERECTOMY ABDOMINAL WITH SALPINGECTOMY    . NASAL SINUS SURGERY      There were no vitals filed for this visit.  Subjective Assessment - 04/12/17 0859    Subjective   I really like that neoprene splint. I retired the other hard splint    Pertinent History  bilateral CTS, Lt proximal humerus fx (non displaced)     Patient Stated Goals  Get my thumb better for working    Currently in Pain?  Yes    Pain Score  4     Pain Location  -- Thumb    Pain Orientation  Right    Pain Descriptors / Indicators  Aching    Pain Type  Acute pain    Pain Onset  More than a month ago    Pain Frequency  Intermittent    Aggravating Factors   repetitive use    Pain Relieving Factors  movement, neoprene splint, heat         [facility] OT Assessment - 04/12/17 0909      Hand Function   Right Hand Grip (lbs)  60 lbs    Left Hand Grip (lbs)  70 lbs               OT Treatments/Exercises (OP) - 04/12/17 0901      Hand Exercises   Other Hand Exercises  Pt issued putty HEP with red resistance putty. See pt instructions for details. Pt however told to avoid tight or sustained pinching and gripping until 3 months from  evaluation. Pt also advised to continue wearing neoprene splint at work and with heavier tasks.       Modalities   Modalities  Fluidotherapy      RUE Fluidotherapy   Number Minutes Fluidotherapy  12 Minutes    RUE Fluidotherapy Location  Hand    Comments  at beginning of session to decrease pain Rt thumb             OT Education - 04/12/17 0912    Education provided  Yes    Education Details  Putty HEP    Person(s) Educated  Patient    Methods  Explanation;Demonstration;Handout    Comprehension  Verbalized understanding;Returned demonstration       OT Short Term Goals - 03/16/17 0850      OT SHORT TERM GOAL #1   Title  Independent with splint wear and care    Time  4    Period  Weeks    Status  Achieved      OT SHORT TERM GOAL #2  Title  Independent with initial HEP for A/ROM Rt thumb    Time  4    Period  Weeks    Status  Achieved        OT Long Term Goals - 04/12/17 3532      OT LONG TERM GOAL #1   Title  Independent with HEP for P/ROM and strengthening Rt thumb    Time  8    Period  Weeks    Status  On-going      OT LONG TERM GOAL #2   Title  Pt to return to basic ADLS like writing, fastening pants, bra, and tying shoes    Time  8    Period  Weeks    Status  On-going      OT LONG TERM GOAL #3   Title  Pt to demo ROM Rt thumb WFL's in prep for IADLS and work related tasks    Time  8    Period  Weeks    Status  On-going      OT LONG TERM GOAL #4   Title  Pt to demo 50% or greater grip strength Rt hand compared to Lt hand    Time  8    Period  Weeks    Status  On-going      OT LONG TERM GOAL #5   Title  Lateral and 3 tip pinch to be 8 lbs or greater Rt hand    Time  8    Period  Weeks    Status  On-going            Plan - 04/12/17 1425    Clinical Impression Statement  Pt progressing with overall decreased pain and tolerating light strengthening today.     Rehab Potential  Good    OT Frequency  2x / week    OT Duration  8 weeks     OT Treatment/Interventions  Self-care/ADL training;Moist Heat;Fluidtherapy;DME and/or AE instruction;Splinting;Therapeutic activities;Ultrasound;Therapeutic exercise;Scar mobilization;Cryotherapy;Passive range of motion;Electrical Stimulation;Paraffin;Patient/family education    Plan  Pt to return in 2 weeks to progress strengthening, update to green putty, continue fluidotherapy, try gripper activity, and possibly d/c - check remaining goals    Consulted and Agree with Plan of Care  Patient       Patient will benefit from skilled therapeutic intervention in order to improve the following deficits and impairments:  Decreased coordination, Decreased range of motion, Impaired flexibility, Increased edema, Impaired sensation, Impaired UE functional use, Pain, Decreased strength, Decreased knowledge of precautions  Visit Diagnosis: Muscle weakness (generalized)  Pain in joints of right hand  Stiffness of right hand, not elsewhere classified    Problem List Patient Active Problem List   Diagnosis Date Noted  . Skier's thumb, right, subsequent encounter 03/15/2017  . Chronic left shoulder pain 02/08/2017  . Nondisplaced fracture of greater tuberosity of left humerus, initial encounter for closed fracture 01/18/2017  . Routine adult health maintenance 09/24/2016  . Hypothyroidism 09/24/2016    Carey Bullocks, OTR/L 04/12/2017, 2:28 PM  [facility] [address] [place], [place], [address] Phone: [phone]   Fax:  [phone]  Name: Alisha Reed MRN: [id number] Date of Birth: [birth year]

## 2017-04-12 NOTE — Patient Instructions (Signed)
Strengthening: Resisted Internal Rotation   Hold tubing in left hand, elbow at side and forearm out. Rotate forearm in across body. Repeat _10-20_ times per set. Do _1-2___ sets per session. Do _2___ sessions per day.  http://orth.exer.us/830   Copyright  VHI. All rights reserved.  Strengthening: Resisted External Rotation   Hold tubing in right hand, elbow at side and forearm across body. Rotate forearm out. Repeat _10-20___ times per set. Do __1-2__ sets per session. Do __2__ sessions per day.  CHEST: Doorway, Bilateral - Standing    Standing in doorway, place hands on wall with elbows bent at shoulder height. Lean forward. Hold __20_ seconds. _3__ reps per set, __2_ sets per day, _7__ days per week  Copyright  VHI. All rights reserved.

## 2017-04-13 MED FILL — CELECOXIB 200 MG CAPS: 200 | 15 days supply | Qty: 30 | Fill #2

## 2017-04-14 ENCOUNTER — Ambulatory Visit: Payer: 59 | Admitting: Physical Therapy

## 2017-04-14 ENCOUNTER — Encounter: Payer: Self-pay | Admitting: Physical Therapy

## 2017-04-14 DIAGNOSIS — M6281 Muscle weakness (generalized): Secondary | ICD-10-CM | POA: Diagnosis not present

## 2017-04-14 DIAGNOSIS — M25541 Pain in joints of right hand: Secondary | ICD-10-CM

## 2017-04-14 DIAGNOSIS — M25641 Stiffness of right hand, not elsewhere classified: Secondary | ICD-10-CM

## 2017-04-14 DIAGNOSIS — M25512 Pain in left shoulder: Secondary | ICD-10-CM

## 2017-04-14 NOTE — Therapy (Signed)
[place], [place], [address] Phone: [phone]   Fax:  [phone]  Physical Therapy Treatment  Patient Details  Name: Alisha Reed MRN: [id number] Date of Birth: Oct 04, 1960 Referring Provider: N. Eduard Roux, MD   Encounter Date: 04/14/2017  PT End of Session - 04/14/17 0722    Visit Number  17    Number of Visits  25    Date for PT Re-Evaluation  05/14/17    Authorization Type  [employer]    PT Start Time  0715    PT Stop Time  0815    PT Time Calculation (min)  60 min       Past Medical History:  Diagnosis Date  . Thyroid disease     Past Surgical History:  Procedure Laterality Date  . cholecystectomy    . FOOT SURGERY Bilateral   . HYSTERECTOMY ABDOMINAL WITH SALPINGECTOMY    . NASAL SINUS SURGERY      There were no vitals filed for this visit.  Subjective Assessment - 04/14/17 0746    Subjective  Slept good. Did not wake from pain.     Currently in Pain?  Yes    Pain Score  2     Pain Location  Shoulder    Pain Orientation  Right    Pain Descriptors / Indicators  Aching         [facility] PT Assessment - 04/14/17 0001      Observation/Other Assessments   Focus on Therapeutic Outcomes (FOTO)   37% limited                   [facility] Adult PT Treatment/Exercise - 04/14/17 0001      Shoulder Exercises: Standing   External Rotation  15 reps;Theraband    Theraband Level (Shoulder External Rotation)  Level 2 (Red)    Internal Rotation  15 reps;Theraband    Theraband Level (Shoulder Internal Rotation)  Level 2 (Red)    Flexion  10 reps 2#    ABduction  10 reps 1#    Other Standing Exercises  cabinet reaching 2# middle shelf    Other Standing Exercises  single bent over row at cabinet 3#, extension 3# , horizontal abduction 1#       Shoulder Exercises: Pulleys   Flexion  2 minutes    ABduction  2 minutes      Shoulder Exercises: ROM/Strengthening   UBE (Upper Arm Bike)   2'/2', level 2       Shoulder Exercises: Stretch   Corner Stretch  3 reps;20 seconds    Wall Stretch - Flexion  10 seconds;3 reps    Wall Stretch - ABduction  10 seconds;3 reps    Other Shoulder Stretches  single arm ER stretch at doorway, elbow at side.       Cryotherapy   Number Minutes Cryotherapy  15 Minutes    Cryotherapy Location  Shoulder    Type of Cryotherapy  Ice pack           --               PT Short Term Goals - 04/08/17 0731      PT SHORT TERM GOAL #1   Title  Pt will demo PROM in Left GHJ equal to R    Baseline  see flowsheet    Time  4    Period  Weeks    Status  On-going  PT SHORT TERM GOAL #2   Title  Pt will demo AAROM of shoulder without increase in shoulder pain    Baseline  no pain with AAROM    Time  4    Period  Weeks    Status  Achieved      PT SHORT TERM GOAL #3   Title  50% improvement in ability to sleep at night    Time  4    Period  Weeks    Status  Achieved        PT Long Term Goals - 04/14/17 3557      PT LONG TERM GOAL #1   Title  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 shoulder pain    Baseline  not cleared yet    Time  12    Period  Weeks    Status  On-going      PT LONG TERM GOAL #2   Title  Pt will be able to return to riding bike without increase in shoulder pain for continued exercise    Baseline  unable     Time  12    Period  Weeks    Status  On-going      PT LONG TERM GOAL #3   Title  FOTO to 39% limitation to indicate significant improvement in functional ability    Baseline  37% limited     Time  12    Period  Weeks    Status  Achieved      PT LONG TERM GOAL #4   Title  Pt will be able to complete all self care and household chores without limitation by shoulder    Baseline  avoids lifting at this time     Time  12    Period  Weeks    Status  On-going            Plan - 04/14/17 0827    Clinical Impression Statement  Focused stretching and  strengthening in standing today with good tolerance. Pt to see MD in one week regarding RTW. LTG# 3 met.     PT Next Visit Plan  review doorway stretch and IR/ER therabands,  manual PRN, increase weight as tolerated, incr WB gently    PT Home Exercise Plan  median nerve glide, stretches: upper trap, levator, scalene; Supine chest press, pullovers, ER with wand; sidelying ER & abd, row & punch yellow tband; IR wand behind back; serratus press, CKC on table circles/rocking, horiz abd red tband; wall walks yellow tband, prone flx & abd, yellow band IR/ER,  doorway stretch.     Consulted and Agree with Plan of Care  Patient       Patient will benefit from skilled therapeutic intervention in order to improve the following deficits and impairments:  Decreased range of motion, Impaired UE functional use, Increased muscle spasms, Decreased activity tolerance, Pain, Improper body mechanics, Impaired flexibility, Decreased strength, Postural dysfunction  Visit Diagnosis: Muscle weakness (generalized)  Pain in joints of right hand  Stiffness of right hand, not elsewhere classified  Acute pain of left shoulder     Problem List Patient Active Problem List   Diagnosis Date Noted  . Skier's thumb, right, subsequent encounter 03/15/2017  . Chronic left shoulder pain 02/08/2017  . Nondisplaced fracture of greater tuberosity of left humerus, initial encounter for closed fracture 01/18/2017  . Routine adult health maintenance 09/24/2016  . Hypothyroidism 09/24/2016  Dorene Ar, [place] 04/14/2017, 8:32 AM  [place], [place], [address] Phone: [phone]   Fax:  [phone]  Name: Alisha Reed MRN: [id number] Date of Birth: 12-29-1960

## 2017-04-19 ENCOUNTER — Ambulatory Visit: Payer: 59 | Admitting: Physical Therapy

## 2017-04-21 ENCOUNTER — Ambulatory Visit: Payer: 59 | Admitting: Physical Therapy

## 2017-04-21 ENCOUNTER — Telehealth (INDEPENDENT_AMBULATORY_CARE_PROVIDER_SITE_OTHER): Payer: Self-pay | Admitting: Physical Medicine and Rehabilitation

## 2017-04-21 NOTE — Telephone Encounter (Signed)
Scheduled for 05/10/17.

## 2017-04-21 NOTE — Telephone Encounter (Signed)
Just let her know that, could change his appointment if needed

## 2017-04-26 ENCOUNTER — Ambulatory Visit: Payer: 59 | Admitting: Occupational Therapy

## 2017-04-26 ENCOUNTER — Ambulatory Visit (INDEPENDENT_AMBULATORY_CARE_PROVIDER_SITE_OTHER): Payer: 59 | Admitting: Orthopaedic Surgery

## 2017-04-26 DIAGNOSIS — M25541 Pain in joints of right hand: Secondary | ICD-10-CM

## 2017-04-26 DIAGNOSIS — M6281 Muscle weakness (generalized): Secondary | ICD-10-CM

## 2017-04-26 DIAGNOSIS — M25641 Stiffness of right hand, not elsewhere classified: Secondary | ICD-10-CM

## 2017-04-26 DIAGNOSIS — S42255A Nondisplaced fracture of greater tuberosity of left humerus, initial encounter for closed fracture: Secondary | ICD-10-CM

## 2017-04-26 DIAGNOSIS — M25512 Pain in left shoulder: Secondary | ICD-10-CM | POA: Diagnosis not present

## 2017-04-26 NOTE — Therapy (Signed)
[facility] [address] [place], [place], [address] Phone: [phone]   Fax:  [phone]  Occupational Therapy Treatment  Patient Details  Name: Alisha Reed MRN: [id number] Date of Birth: 1960/06/15 Referring Provider (Historical): Dr. Erlinda Hong   Encounter Date: 04/26/2017  OT End of Session - 04/26/17 1007    Visit Number  5    Number of Visits  12    Authorization Type  [employer] employee, [employer] plan    OT Start Time  0930    OT Stop Time  1005    OT Time Calculation (min)  35 min    Activity Tolerance  Patient tolerated treatment well    Behavior During Therapy  [facility] for tasks assessed/performed       Past Medical History:  Diagnosis Date  . Thyroid disease     Past Surgical History:  Procedure Laterality Date  . cholecystectomy    . FOOT SURGERY Bilateral   . HYSTERECTOMY ABDOMINAL WITH SALPINGECTOMY    . NASAL SINUS SURGERY      There were no vitals filed for this visit.  Subjective Assessment - 04/26/17 0934    Subjective   I think I'm ready for d/c today. I start back work full duty this Wednesday    Pertinent History  bilateral CTS, Lt proximal humerus fx (non displaced)     Patient Stated Goals  Get my thumb better for working    Currently in Pain?  No/denies         [facility] OT Assessment - 04/26/17 0001      Hand Function   Right Hand Grip (lbs)  68 lbs    Right Hand Lateral Pinch  15 lbs    Right Hand 3 Point Pinch  13 lbs               OT Treatments/Exercises (OP) - 04/26/17 0001      ADLs   Writing  Practiced writing with built up pen with greater ease and comfort. Issued tan foam for pen      Hand Exercises   Other Hand Exercises  Updated to green resistance putty for HEP and demo each x 10-15 reps. Issued green putty    Other Hand Exercises  Gripper activity at level 1 resistance to pick up blocks for sustained grip strength Rt hand. Pt able to do without rest break       RUE Fluidotherapy   Number Minutes Fluidotherapy  12 Minutes    RUE Fluidotherapy Location  Hand    Comments  at beginning of session to decr. stiffness Rt thumb               OT Short Term Goals - 03/16/17 0850      OT SHORT TERM GOAL #1   Title  Independent with splint wear and care    Time  4    Period  Weeks    Status  Achieved      OT SHORT TERM GOAL #2   Title  Independent with initial HEP for A/ROM Rt thumb    Time  4    Period  Weeks    Status  Achieved        OT Long Term Goals - 04/26/17 0957      OT LONG TERM GOAL #1   Title  Independent with HEP for P/ROM and strengthening Rt thumb    Time  8    Period  Weeks  Status  Achieved      OT LONG TERM GOAL #2   Title  Pt to return to basic ADLS like writing, fastening pants, bra, and tying shoes    Time  8    Period  Weeks    Status  Achieved      OT LONG TERM GOAL #3   Title  Pt to demo ROM Rt thumb WFL's in prep for IADLS and work related tasks    Time  8    Period  Weeks    Status  Achieved      OT LONG TERM GOAL #4   Title  Pt to demo 50% or greater grip strength Rt hand compared to Lt hand    Time  8    Period  Weeks    Status  Achieved Rt = 68 lbs, Lt = 69 lbs      OT LONG TERM GOAL #5   Title  Lateral and 3 tip pinch to be 8 lbs or greater Rt hand    Time  8    Period  Weeks    Status  On-going Lateral pinch = 15 lbs, 3 tip pinch = 13 lbs            Plan - 04/26/17 1003    Clinical Impression Statement  Pt met all STG's and LTG's. Pt with only occasional min pain, but strength and functional use has improved significantly. Pt returns to full duty work this week.     Occupational Profile and client history currently impacting functional performance  PMH: Bilateral CTS. 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 Pt is more impaired d/t bilateral UE injuries limiting her ability to perform even BADLS     Occupational performance deficits (Please refer to evaluation for details):  ADL's;IADL's;Work;Leisure;Social Participation    Rehab Potential  Good    OT Treatment/Interventions  Self-care/ADL training;Moist Heat;Fluidtherapy;DME and/or AE instruction;Splinting;Therapeutic activities;Ultrasound;Therapeutic exercise;Scar mobilization;Cryotherapy;Passive range of motion;Electrical Stimulation;Paraffin;Patient/family education    Plan  D/C O.T.     Consulted and Agree with Plan of Care  Patient       Patient will benefit from skilled therapeutic intervention in order to improve the following deficits and impairments:  Decreased coordination, Decreased range of motion, Impaired flexibility, Increased edema, Impaired sensation, Impaired UE functional use, Pain, Decreased strength, Decreased knowledge of precautions  Visit Diagnosis: Muscle weakness (generalized)  Stiffness of right hand, not elsewhere classified  Pain in joints of right hand    Problem List Patient Active Problem List   Diagnosis Date Noted  . Skier's thumb, right, subsequent encounter 03/15/2017  . Chronic left shoulder pain 02/08/2017  . Nondisplaced fracture of greater tuberosity of left humerus, initial encounter for closed fracture 01/18/2017  . Routine adult health maintenance 09/24/2016  . Hypothyroidism 09/24/2016   OCCUPATIONAL THERAPY DISCHARGE SUMMARY  Visits from Start of Care: 5  Current functional level related to goals / functional outcomes: Pt met all goals - see above for details   Remaining deficits: Occasional mild pain   Education / Equipment: HEP's, A/E for writing, pain management strategies  Plan: Patient agrees to discharge.  Patient goals were met. Patient is being discharged due to meeting the stated rehab goals.  ?????        Carey Bullocks, OTR/L 04/26/2017, 10:08 AM  [facility] [address]  [place], [place], [address] Phone: [phone]   Fax:  [phone]  Name: Alisha Reed MRN: [id number] Date of Birth: 05-30-1960

## 2017-04-26 NOTE — Progress Notes (Signed)
Alisha Reed is status post nondisplaced greater tuberosity fracture.  She is overall doing well.  She is coming along with physical therapy.  She has had a recent injection and she has another one at the end of this month.  She is ready to go back to work restrictions.  On physical exam her range of motion has progressed significantly.  She lacks about 10-15 degrees in forward flexion and external rotation.  She has no pain.  She does have some expected weakness.  At this point she is released back to full duty.  Follow-up as needed.

## 2017-04-27 ENCOUNTER — Ambulatory Visit: Payer: 59 | Admitting: Physical Therapy

## 2017-04-28 MED FILL — VENLAFAXINE HCL ER 150 MG C: 150 | 30 days supply | Qty: 30 | Fill #3

## 2017-04-28 MED FILL — CELECOXIB 200 MG CAP: 200 | 15 days supply | Qty: 30 | Fill #3

## 2017-04-29 ENCOUNTER — Encounter: Payer: Self-pay | Admitting: Physical Therapy

## 2017-04-29 ENCOUNTER — Ambulatory Visit: Payer: 59 | Admitting: Physical Therapy

## 2017-04-29 DIAGNOSIS — M25641 Stiffness of right hand, not elsewhere classified: Secondary | ICD-10-CM

## 2017-04-29 DIAGNOSIS — M6281 Muscle weakness (generalized): Secondary | ICD-10-CM

## 2017-04-29 DIAGNOSIS — M25541 Pain in joints of right hand: Secondary | ICD-10-CM

## 2017-04-29 DIAGNOSIS — M25512 Pain in left shoulder: Secondary | ICD-10-CM

## 2017-04-29 NOTE — Therapy (Signed)
[place], [place], [address] Phone: [phone]   Fax:  [phone]  Physical Therapy Treatment  Patient Details  Name: Alisha Reed MRN: [id number] Date of Birth: 1960-05-17 Referring Provider: N. Eduard Roux, MD   Encounter Date: 04/29/2017  PT End of Session - 04/29/17 0719    Visit Number  18    Number of Visits  25    Date for PT Re-Evaluation  05/14/17    Authorization Type  [facility] [employer]    PT Start Time  0715    PT Stop Time  0815    PT Time Calculation (min)  60 min       Past Medical History:  Diagnosis Date  . Thyroid disease     Past Surgical History:  Procedure Laterality Date  . cholecystectomy    . FOOT SURGERY Bilateral   . HYSTERECTOMY ABDOMINAL WITH SALPINGECTOMY    . NASAL SINUS SURGERY      There were no vitals filed for this visit.  Subjective Assessment - 04/29/17 0716    Subjective  sore to touch lateral side of upper arm. I had to hold up legs and shoulders to prep in the OR. No pain, just felt weak and trembly.     Currently in Pain?  No/denies    Aggravating Factors   touching it     Pain Relieving Factors  dont touch it          [facility] PT Assessment - 04/29/17 0001      AROM   AROM Assessment Site  Shoulder    Right/Left Shoulder  Left      PROM   Left Shoulder Flexion  140 Degrees    Left Shoulder Internal Rotation  75 Degrees    Left Shoulder External Rotation  45 Degrees                  [facility] Adult PT Treatment/Exercise - 04/29/17 0001      Shoulder Exercises: Supine   Horizontal ABduction  Strengthening;Both green    Other Supine Exercises  scap stab green band x 15 each       Shoulder Exercises: Sidelying   External Rotation  15 reps 3#    ABduction  15 reps 2#      Shoulder Exercises: Standing   Flexion  15 reps 3#    ABduction  15 reps 2#      Shoulder Exercises: Pulleys   Flexion  2 minutes    ABduction  2 minutes      Shoulder Exercises: ROM/Strengthening   UBE (Upper Arm Bike)  2'/2', level 2       Shoulder Exercises: Stretch   Corner Stretch  3 reps;20 seconds    Wall Stretch - Flexion  10 seconds;3 reps    Wall Stretch - ABduction  10 seconds;3 reps               PT Short Term Goals - 04/08/17 0731      PT SHORT TERM GOAL #1   Title  Pt will demo PROM in Left GHJ equal to R    Baseline  see flowsheet    Time  4    Period  Weeks    Status  On-going      PT SHORT TERM GOAL #2   Title  Pt will demo AAROM of shoulder without increase in shoulder pain    Baseline  no pain with AAROM  Time  4    Period  Weeks    Status  Achieved      PT SHORT TERM GOAL #3   Title  50% improvement in ability to sleep at night    Time  4    Period  Weeks    Status  Achieved        PT Long Term Goals - 04/14/17 9211      PT LONG TERM GOAL #1   Title  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 shoulder pain    Baseline  not cleared yet    Time  12    Period  Weeks    Status  On-going      PT LONG TERM GOAL #2   Title  Pt will be able to return to riding bike without increase in shoulder pain for continued exercise    Baseline  unable     Time  12    Period  Weeks    Status  On-going      PT LONG TERM GOAL #3   Title  FOTO to 39% limitation to indicate significant improvement in functional ability    Baseline  37% limited     Time  12    Period  Weeks    Status  Achieved      PT LONG TERM GOAL #4   Title  Pt will be able to complete all self care and household chores without limitation by shoulder    Baseline  avoids lifting at this time     Time  12    Period  Weeks    Status  On-going            Plan - 04/29/17 0825    Clinical Impression Statement  Progressing well with tolerance to strength and ROM exercises, increased resistance tolerated well. She returned to work yesterday and did well, just felt weak when holding up extremeties in the  OR.     PT Next Visit Plan  review doorway stretch and IR/ER therabands,  manual PRN, increase weight as tolerated, incr WB gently    PT Home Exercise Plan  median nerve glide, stretches: upper trap, levator, scalene; Supine chest press, pullovers, ER with wand; sidelying ER & abd, row & punch yellow tband; IR wand behind back; serratus press, CKC on table circles/rocking, horiz abd red tband; wall walks yellow tband, prone flx & abd, yellow band IR/ER,  doorway stretch.     Consulted and Agree with Plan of Care  Patient       Patient will benefit from skilled therapeutic intervention in order to improve the following deficits and impairments:  Decreased range of motion, Impaired UE functional use, Increased muscle spasms, Decreased activity tolerance, Pain, Improper body mechanics, Impaired flexibility, Decreased strength, Postural dysfunction  Visit Diagnosis: Muscle weakness (generalized)  Stiffness of right hand, not elsewhere classified  Pain in joints of right hand  Acute pain of left shoulder     Problem List Patient Active Problem List   Diagnosis Date Noted  . Skier's thumb, right, subsequent encounter 03/15/2017  . Chronic left shoulder pain 02/08/2017  . Nondisplaced fracture of greater tuberosity of left humerus, initial encounter for closed fracture 01/18/2017  . Routine adult health maintenance 09/24/2016  . Hypothyroidism 09/24/2016    Dorene Ar, PTA 04/29/2017, 9:29 AM  [facility] [address] [place], [place], [address] Phone: [phone]  Fax:  [phone]  Name: Alisha Reed MRN: [id number] Date of Birth: 05-Nov-1960

## 2017-05-06 ENCOUNTER — Ambulatory Visit: Payer: 59 | Admitting: Physical Therapy

## 2017-05-06 ENCOUNTER — Encounter: Payer: Self-pay | Admitting: Physical Therapy

## 2017-05-06 DIAGNOSIS — M25641 Stiffness of right hand, not elsewhere classified: Secondary | ICD-10-CM

## 2017-05-06 DIAGNOSIS — M6281 Muscle weakness (generalized): Secondary | ICD-10-CM | POA: Diagnosis not present

## 2017-05-06 DIAGNOSIS — M25541 Pain in joints of right hand: Secondary | ICD-10-CM | POA: Diagnosis not present

## 2017-05-06 DIAGNOSIS — M25512 Pain in left shoulder: Secondary | ICD-10-CM

## 2017-05-06 NOTE — Therapy (Signed)
[place], [place], [address] Phone: [phone]   Fax:  [phone]  Physical Therapy Treatment  Patient Details  Name: Alisha Reed MRN: [id number] Date of Birth: [birth year] Referring Provider: N. Eduard Roux, MD   Encounter Date: 05/06/2017  PT End of Session - 05/06/17 0828    Visit Number  19    Number of Visits  25    Date for PT Re-Evaluation  05/14/17    Authorization Type  [facility]    PT Start Time  0715    PT Stop Time  0810    PT Time Calculation (min)  55 min       Past Medical History:  Diagnosis Date  . Thyroid disease     Past Surgical History:  Procedure Laterality Date  . cholecystectomy    . FOOT SURGERY Bilateral   . HYSTERECTOMY ABDOMINAL WITH SALPINGECTOMY    . NASAL SINUS SURGERY      There were no vitals filed for this visit.  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     Currently in Pain?  Yes    Pain Score  2     Pain Location  Shoulder    Pain Orientation  Right    Pain Descriptors / Indicators  Sore    Aggravating Factors   Over doing it at work     Pain Relieving Factors  rest                       [facility] Adult PT Treatment/Exercise - 05/06/17 0001      Shoulder Exercises: Standing   External Rotation  15 reps;Theraband    Theraband Level (Shoulder External Rotation)  Level 2 (Red)    Internal Rotation  15 reps;Theraband    Theraband Level (Shoulder Internal Rotation)  Level 2 (Red)    Flexion  20 reps 2#    ABduction  20 reps 2#    Other Standing Exercises  wall push ups x 15     Other Standing Exercises  single bent over row at cabinet 3#, extension 3# , horizontal abduction 2#      Shoulder Exercises: Pulleys   Flexion  2 minutes    ABduction  2 minutes      Shoulder Exercises: ROM/Strengthening   UBE (Upper Arm Bike)  2'/2', level 2       Shoulder  Exercises: Stretch   Corner Stretch  3 reps;20 seconds    Wall Stretch - Flexion  10 seconds;3 reps    Wall Stretch - ABduction  10 seconds;3 reps    Other Shoulder Stretches  single arm ER stretch at doorway, elbow at side.                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    Baseline  not cleared yet    Time  12    Period  Weeks    Status  On-going      PT LONG TERM GOAL #2   Title  Pt will be able to return to riding bike without increase in shoulder pain for continued exercise    Baseline  unable     Time  12    Period  Weeks    Status  On-going      PT LONG TERM GOAL #3   Title  FOTO to 39% limitation to indicate significant improvement in functional ability    Baseline  37% limited     Time  12    Period  Weeks    Status  Achieved      PT LONG TERM GOAL #4   Title  Pt will be able to complete all self care and household chores without limitation by shoulder    Baseline  avoids lifting at this time     Time  12    Period  Weeks    Status  On-going            Plan - 05/06/17 5462    Clinical Impression Statement  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 She believes the heavy beds and tables are what is making her sore. The reports she was able to don bra on  from behind for the first time. Continued strengthening. Lacks ROM complared to oppoiste UE. Will recieve a cortisone injection on Monday. Progressing toward  remaining goals. Began wall push ups with on inreased pain.     PT Next Visit Plan   IR/ER therabands,  manual PRN, increase weight as tolerated, incr WB gently    PT Home Exercise Plan  median nerve glide, stretches: upper trap, levator, scalene; Supine chest press, pullovers, ER with wand; sidelying ER & abd, row & punch yellow tband; IR wand behind back; serratus press, CKC on table circles/rocking, horiz abd red tband; wall walks yellow tband, prone flx & abd, yellow band IR/ER,  doorway stretch.     Consulted and Agree with Plan of Care  Patient       Patient will benefit from skilled therapeutic intervention in order to improve the following deficits and impairments:  Decreased range of motion, Impaired UE functional use, Increased muscle spasms, Decreased activity tolerance, Pain, Improper body mechanics, Impaired flexibility, Decreased strength, Postural dysfunction  Visit Diagnosis: Muscle weakness (generalized)  Stiffness of right hand, not elsewhere classified  Acute pain of left shoulder  Pain in joints of right hand     Problem List Patient Active Problem List   Diagnosis Date Noted  . Skier's thumb, right, subsequent encounter 03/15/2017  . Chronic left shoulder pain 02/08/2017  . Nondisplaced fracture of greater tuberosity of left humerus, initial encounter for closed fracture 01/18/2017  . Routine adult health maintenance 09/24/2016  . Hypothyroidism  09/24/2016    Dorene Ar , PTA 05/06/2017, 8:58 AM  [place], [place], [address] Phone: [phone]   Fax:  [phone]  Name: Alisha Reed MRN: [id number] Date of Birth: Mar 23, 1961

## 2017-05-07 ENCOUNTER — Ambulatory Visit: Payer: 59 | Admitting: Physical Therapy

## 2017-05-07 ENCOUNTER — Encounter: Payer: Self-pay | Admitting: Physical Therapy

## 2017-05-07 DIAGNOSIS — M6281 Muscle weakness (generalized): Secondary | ICD-10-CM | POA: Diagnosis not present

## 2017-05-07 DIAGNOSIS — M25641 Stiffness of right hand, not elsewhere classified: Secondary | ICD-10-CM | POA: Diagnosis not present

## 2017-05-07 DIAGNOSIS — M25512 Pain in left shoulder: Secondary | ICD-10-CM

## 2017-05-07 DIAGNOSIS — M25541 Pain in joints of right hand: Secondary | ICD-10-CM

## 2017-05-07 NOTE — Therapy (Signed)
[place], [place], [address] Phone: [phone]   Fax:  [phone]  Physical Therapy Treatment  Patient Details  Name: Alisha Reed MRN: [id number] Date of Birth: [birth year] Referring Provider: N. Eduard Roux, MD   Encounter Date: 05/07/2017  PT End of Session - 05/07/17 0814    Visit Number  20    Number of Visits  25    Date for PT Re-Evaluation  05/14/17    Authorization Type  [facility]    PT Start Time  0800    PT Stop Time  0900    PT Time Calculation (min)  60 min       Past Medical History:  Diagnosis Date  . Thyroid disease     Past Surgical History:  Procedure Laterality Date  . cholecystectomy    . FOOT SURGERY Bilateral   . HYSTERECTOMY ABDOMINAL WITH SALPINGECTOMY    . NASAL SINUS SURGERY      There were no vitals filed for this visit.  Subjective Assessment - 05/07/17 0813    Subjective  I felt better after PT yesterday     Currently in Pain?  No/denies                      [facility] Adult PT Treatment/Exercise - 05/07/17 0001      Shoulder Exercises: Prone   Extension  15 reps 3#    Horizontal ABduction 1  15 reps 2#    Other Prone Exercises  row 3#      Shoulder Exercises: Standing   External Rotation  15 reps    Theraband Level (Shoulder External Rotation)  Level 3 (Green)    Internal Rotation  15 reps    Theraband Level (Shoulder Internal Rotation)  Level 3 (Green)    Flexion  20 reps 3#    ABduction  20 reps 3#    Row  20 reps    Other Standing Exercises  wall push ups x 15 , counter push ups x 10       Shoulder Exercises: Pulleys   Flexion  2 minutes    ABduction  2 minutes      Shoulder Exercises: ROM/Strengthening   UBE (Upper Arm Bike)  2'/2', level 3      Shoulder Exercises: Stretch   Corner Stretch  3 reps;20 seconds    Wall Stretch - Flexion  10 seconds;3 reps    Wall Stretch - ABduction  10 seconds;3 reps      Cryotherapy   Number  Minutes Cryotherapy  10 Minutes    Cryotherapy Location  Shoulder    Type of Cryotherapy  Ice pack               PT Short Term Goals - 04/08/17 0731      PT SHORT TERM GOAL #1   Title  Pt will demo PROM in Left GHJ equal to R    Baseline  see flowsheet    Time  4    Period  Weeks    Status  On-going      PT SHORT TERM GOAL #2   Title  Pt will demo AAROM of shoulder without increase in shoulder pain    Baseline  no pain with AAROM    Time  4    Period  Weeks    Status  Achieved      PT SHORT TERM GOAL #3   Title  50%  improvement in ability to sleep at night    Time  4    Period  Weeks    Status  Achieved        PT Long Term Goals - 05/07/17 0902      PT LONG TERM GOAL #1   Title  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 shoulder pain    Time  12    Period  Weeks    Status  On-going      PT LONG TERM GOAL #2   Title  Pt will be able to return to riding bike without increase in shoulder pain for continued exercise    Baseline  not planning to anytime soon    Time  12    Period  Weeks    Status  Deferred      PT LONG TERM GOAL #3   Title  FOTO to 39% limitation to indicate significant improvement in functional ability    Baseline  37% limited     Time  12    Period  Weeks    Status  Achieved      PT LONG TERM GOAL #4   Title  Pt will be able to complete all self care and household chores without limitation by shoulder    Time  12    Period  Weeks    Status  Achieved            Plan - 05/07/17 5993    Clinical Impression Statement  Progressed to counter push ups with difficulty however no pain. Continued strengthening and finalizing HEP. PLan for 2 more visits, then discharge. Reviewed her body mechanics with duties in the [place] and  made suggestions. She has returned to all household chores and self care without increased pain. LTG# 4 met.     PT Treatment/Interventions  ADLs/Self Care Home  Management;Cryotherapy;Electrical Stimulation;Functional mobility training;Traction;Moist Heat;Therapeutic activities;Therapeutic exercise;Neuromuscular re-education;Patient/family education;Passive range of motion;Manual techniques;Dry needling;Taping    PT Next Visit Plan   IR/ER therabands,  manual PRN, increase weight as tolerated, incr WB gently    PT Home Exercise Plan  median nerve glide, stretches: upper trap, levator, scalene; Supine chest press, pullovers, ER with wand; sidelying ER & abd, row & punch yellow tband; IR wand behind back; serratus press, CKC on table circles/rocking, horiz abd red tband; wall walks yellow tband, prone flx & abd, yellow band IR/ER,  doorway stretch.     Consulted and Agree with Plan of Care  Patient       Patient will benefit from skilled therapeutic intervention in order to improve the following deficits and impairments:  Decreased range of motion, Impaired UE functional use, Increased muscle spasms, Decreased activity tolerance, Pain, Improper body mechanics, Impaired flexibility, Decreased strength, Postural dysfunction  Visit Diagnosis: Muscle weakness (generalized)  Stiffness of right hand, not elsewhere classified  Acute pain of left shoulder  Pain in joints of right hand     Problem List Patient Active Problem List   Diagnosis Date Noted  . Skier's thumb, right, subsequent encounter 03/15/2017  . Chronic left shoulder pain 02/08/2017  . Nondisplaced fracture of greater tuberosity of left humerus, initial encounter for closed fracture 01/18/2017  . Routine adult health maintenance 09/24/2016  . Hypothyroidism 09/24/2016    Dorene Ar, PTA 05/07/2017, 9:05 AM  [facility] [address] [place], [place], [address] Phone: [phone]   Fax:  [phone]  Name: Clarisa Danser MRN: [id number] Date of Birth: Dec 22, 1960

## 2017-05-10 ENCOUNTER — Ambulatory Visit (INDEPENDENT_AMBULATORY_CARE_PROVIDER_SITE_OTHER): Payer: Self-pay

## 2017-05-10 ENCOUNTER — Ambulatory Visit (INDEPENDENT_AMBULATORY_CARE_PROVIDER_SITE_OTHER): Payer: 59 | Admitting: Physical Medicine and Rehabilitation

## 2017-05-10 ENCOUNTER — Encounter (INDEPENDENT_AMBULATORY_CARE_PROVIDER_SITE_OTHER): Payer: Self-pay | Admitting: Physical Medicine and Rehabilitation

## 2017-05-10 DIAGNOSIS — M25512 Pain in left shoulder: Secondary | ICD-10-CM

## 2017-05-10 DIAGNOSIS — G8929 Other chronic pain: Secondary | ICD-10-CM | POA: Diagnosis not present

## 2017-05-10 NOTE — Progress Notes (Signed)
Alisha Reed - 56 y.o. female MRN [id number]  Date of birth: 03/22/1961  Office Visit Note: Visit Date: 05/10/2017 PCP: Golden Circle, FNP Referred by: Golden Circle, FNP  Subjective: Chief Complaint  Patient presents with  . Left Shoulder - Pain   HPI: Alisha Reed is a very pleasant 56 year old female that has had prior left anesthetic arthrogram of the glenohumeral joint.  She comes in today at the request of Dr. Erlinda Hong for repeat injection.  She did get good relief with the prior injection and she is been undergoing physical therapy for the shoulder.    ROS Otherwise per HPI.  Assessment & Plan: Visit Diagnoses:  1. Chronic left shoulder pain     Plan: Findings:  Diagnostic and hopefully therapeutic left glenohumeral joint intra-articular injection with fluoroscopic guidance.  Patient did have relief during the anesthetic phase of the injection.    Meds & Orders: No orders of the defined types were placed in this encounter.   Orders Placed This Encounter  Procedures  . Large Joint Inj: L glenohumeral  . XR C-ARM NO REPORT    Follow-up: Return if symptoms worsen or fail to improve, for Dr. Erlinda Hong.   Procedures: Large Joint Inj: L glenohumeral on 05/10/2017 1:16 PM Indications: pain and diagnostic evaluation Details: 22 G 3.5 in needle, fluoroscopy-guided anteromedial approach  Arthrogram: No  Medications: 3 mL bupivacaine 0.5 %; 80 mg triamcinolone acetonide 40 MG/ML Outcome: tolerated well, no immediate complications  There was excellent flow of contrast producing a partial arthrogram of the glenohumeral joint. The patient did have relief of symptoms during the anesthetic phase of the injection. Procedure, treatment alternatives, risks and benefits explained, specific risks discussed. Consent was given by the patient. Immediately prior to procedure a time out was called to verify the correct patient, procedure, equipment, support staff and site/side marked as  required. Patient was prepped and draped in the usual sterile fashion.      No notes on file   Clinical History: No specialty comments available.  She reports that  has never smoked. she has never used smokeless tobacco. No results for input(s): HGBA1C, LABURIC in the last 8760 hours.  Objective:  VS:  HT:    WT:   BMI:     BP:   HR: bpm  TEMP: ( )  RESP:  Physical Exam  Ortho Exam Imaging: No results found.  Past Medical/Family/Surgical/Social History: Medications & Allergies reviewed per EMR Patient Active Problem List   Diagnosis Date Noted  . Skier's thumb, right, subsequent encounter 03/15/2017  . Chronic left shoulder pain 02/08/2017  . Nondisplaced fracture of greater tuberosity of left humerus, initial encounter for closed fracture 01/18/2017  . Routine adult health maintenance 09/24/2016  . Hypothyroidism 09/24/2016   Past Medical History:  Diagnosis Date  . Thyroid disease    Family History  Problem Relation Age of Onset  . Scleroderma Mother   . Rheum arthritis Mother   . Heart disease Mother   . Heart disease Father   . Glaucoma Father   . Stomach cancer Maternal Grandmother   . Heart attack Maternal Grandfather   . Alzheimer's disease Paternal Grandmother   . Throat cancer Paternal Grandfather    Past Surgical History:  Procedure Laterality Date  . cholecystectomy    . FOOT SURGERY Bilateral   . HYSTERECTOMY ABDOMINAL WITH SALPINGECTOMY    . NASAL SINUS SURGERY     Social History   Occupational History  . Occupation: Therapist, sports  Tobacco Use  . Smoking status: Never Smoker  . Smokeless tobacco: Never Used  Substance and Sexual Activity  . Alcohol use: Yes    Comment: Scoial  . Drug use: No  . Sexual activity: Yes    Birth control/protection: Surgical

## 2017-05-10 NOTE — Progress Notes (Deleted)
Pt states pain in left shoulder. Pt states the last injection helped tremendously. Pt states pain increases with anything that causes her left shoulder/arm to bend backwards.  -Dye Allergies.

## 2017-05-10 NOTE — Patient Instructions (Signed)

## 2017-05-13 ENCOUNTER — Encounter: Payer: Self-pay | Admitting: Physical Therapy

## 2017-05-13 ENCOUNTER — Ambulatory Visit: Payer: 59 | Attending: Orthopaedic Surgery | Admitting: Physical Therapy

## 2017-05-13 ENCOUNTER — Other Ambulatory Visit (INDEPENDENT_AMBULATORY_CARE_PROVIDER_SITE_OTHER): Payer: Self-pay | Admitting: Orthopaedic Surgery

## 2017-05-13 DIAGNOSIS — M25641 Stiffness of right hand, not elsewhere classified: Secondary | ICD-10-CM | POA: Diagnosis not present

## 2017-05-13 DIAGNOSIS — M25512 Pain in left shoulder: Secondary | ICD-10-CM | POA: Diagnosis not present

## 2017-05-13 DIAGNOSIS — M6281 Muscle weakness (generalized): Secondary | ICD-10-CM | POA: Diagnosis not present

## 2017-05-13 DIAGNOSIS — M25541 Pain in joints of right hand: Secondary | ICD-10-CM | POA: Diagnosis not present

## 2017-05-13 MED FILL — CELECOXIB 200 MG CAP: 200 | 15 days supply | Qty: 30 | Fill #0

## 2017-05-13 MED FILL — traMADol HCL 50 MG TABS: 50 | 5 days supply | Qty: 30 | Fill #1

## 2017-05-13 NOTE — Therapy (Signed)
[place], [place], [address] Phone: [phone]   Fax:  [phone]  Physical Therapy Treatment  Patient Details  Name: Alisha Reed MRN: [id number] Date of Birth: [birth year] Referring Provider: N. Eduard Roux, MD   Encounter Date: 05/13/2017  PT End of Session - 05/13/17 0720    Visit Number  21    Number of Visits  25    Date for PT Re-Evaluation  05/14/17    Authorization Type  [facility]    PT Start Time  0715    PT Stop Time  0809    PT Time Calculation (min)  54 min       Past Medical History:  Diagnosis Date  . Thyroid disease     Past Surgical History:  Procedure Laterality Date  . cholecystectomy    . FOOT SURGERY Bilateral   . HYSTERECTOMY ABDOMINAL WITH SALPINGECTOMY    . NASAL SINUS SURGERY      There were no vitals filed for this visit.  Subjective Assessment - 05/13/17 0717    Subjective  The cortisone injection made the shoulder sore. I had to pull traction on an extremity during surgery so I was more sore after that.     Currently in Pain?  Yes    Pain Score  2     Pain Location  Shoulder    Pain Orientation  Right    Pain Descriptors / Indicators  Aching    Aggravating Factors   pulling traction on an extremity during surgery     Pain Relieving Factors  rest                       [facility] Adult PT Treatment/Exercise - 05/13/17 0001      Shoulder Exercises: Supine   Flexion  20 reps 2#      Shoulder Exercises: Prone   Extension  20 reps 3#    Horizontal ABduction 1  20 reps 2#      Shoulder Exercises: Sidelying   External Rotation  20 reps 2#    ABduction  20 reps 2#       Shoulder Exercises: Standing   Row  20 reps blue    Other Standing Exercises  counter push ups x 15      Shoulder Exercises: Pulleys   Flexion  2 minutes    ABduction  2 minutes      Shoulder Exercises: ROM/Strengthening   UBE (Upper Arm Bike)  2'/2', level 3      Shoulder  Exercises: Stretch   Corner Stretch  3 reps;20 seconds    Wall Stretch - Flexion  10 seconds;3 reps      Cryotherapy   Number Minutes Cryotherapy  10 Minutes    Cryotherapy Location  Shoulder    Type of Cryotherapy  Ice pack               PT Short Term Goals - 04/08/17 0731      PT SHORT TERM GOAL #1   Title  Pt will demo PROM in Left GHJ equal to R    Baseline  see flowsheet    Time  4    Period  Weeks    Status  On-going      PT SHORT TERM GOAL #2   Title  Pt will demo AAROM of shoulder without increase in shoulder pain    Baseline  no pain with AAROM  Time  4    Period  Weeks    Status  Achieved      PT SHORT TERM GOAL #3   Title  50% improvement in ability to sleep at night    Time  4    Period  Weeks    Status  Achieved        PT Long Term Goals - 05/07/17 0902      PT LONG TERM GOAL #1   Title  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 shoulder pain    Time  12    Period  Weeks    Status  On-going      PT LONG TERM GOAL #2   Title  Pt will be able to return to riding bike without increase in shoulder pain for continued exercise    Baseline  not planning to anytime soon    Time  12    Period  Weeks    Status  Deferred      PT LONG TERM GOAL #3   Title  FOTO to 39% limitation to indicate significant improvement in functional ability    Baseline  37% limited     Time  12    Period  Weeks    Status  Achieved      PT LONG TERM GOAL #4   Title  Pt will be able to complete all self care and household chores without limitation by shoulder    Time  12    Period  Weeks    Status  Achieved            Plan - 05/13/17 0809    Clinical Impression Statement  Pt reports more soreness after cortisone injection and also was pulling traction on an extremity during surgery which caused more soreness. Continued strenthening and stretching followed by manual stretching. Most limited in ER. Pt is independent with HEP  thus far. She does not plan to retrun to bike riding anytime soon however has met all other LTGs. She is agreeable to discharge next visit.     PT Next Visit Plan  Probable discharge next visit ;  IR/ER therabands,  manual PRN, increase weight as tolerated, incr WB gently    PT Home Exercise Plan  median nerve glide, stretches: upper trap, levator, scalene; Supine chest press, pullovers, ER with wand; sidelying ER & abd, row & punch yellow tband; IR wand behind back; serratus press, CKC on table circles/rocking, horiz abd red tband; wall walks yellow tband, prone flx & abd, yellow band IR/ER,  doorway stretch.     Consulted and Agree with Plan of Care  Patient       Patient will benefit from skilled therapeutic intervention in order to improve the following deficits and impairments:  Decreased range of motion, Impaired UE functional use, Increased muscle spasms, Decreased activity tolerance, Pain, Improper body mechanics, Impaired flexibility, Decreased strength, Postural dysfunction  Visit Diagnosis: Muscle weakness (generalized)  Stiffness of right hand, not elsewhere classified  Acute pain of left shoulder  Pain in joints of right hand     Problem List Patient Active Problem List   Diagnosis Date Noted  . Skier's thumb, right, subsequent encounter 03/15/2017  . Chronic left shoulder pain 02/08/2017  . Nondisplaced fracture of greater tuberosity of left humerus, initial encounter for closed fracture 01/18/2017  . Routine adult health maintenance 09/24/2016  . Hypothyroidism 09/24/2016    Dorene Ar, PTA  05/13/2017, 8:44 AM  [place], [place], [address] Phone: [phone]   Fax:  [phone]  Name: Peytyn Trine MRN: [id number] Date of Birth: [birth year]

## 2017-05-14 ENCOUNTER — Encounter: Payer: Self-pay | Admitting: Physical Therapy

## 2017-05-14 ENCOUNTER — Ambulatory Visit: Payer: 59 | Admitting: Physical Therapy

## 2017-05-14 DIAGNOSIS — M25512 Pain in left shoulder: Secondary | ICD-10-CM | POA: Diagnosis not present

## 2017-05-14 DIAGNOSIS — M6281 Muscle weakness (generalized): Secondary | ICD-10-CM | POA: Diagnosis not present

## 2017-05-14 DIAGNOSIS — M25541 Pain in joints of right hand: Secondary | ICD-10-CM | POA: Diagnosis not present

## 2017-05-14 DIAGNOSIS — M25641 Stiffness of right hand, not elsewhere classified: Secondary | ICD-10-CM | POA: Diagnosis not present

## 2017-05-14 NOTE — Therapy (Signed)
[place], [place], [address] Phone: [phone]   Fax:  [phone]  Physical Therapy Treatment/Discharge summary  Patient Details  Name: Margurette Brener MRN: [id number] Date of Birth: 12/05/1960 Referring Provider: N. Eduard Roux, MD   Encounter Date: 05/14/2017  PT End of Session - 05/14/17 0752    Visit Number  22    Number of Visits  25    Date for PT Re-Evaluation  05/14/17    Authorization Type  [facility]    PT Start Time  0758    PT Stop Time  0837    PT Time Calculation (min)  39 min    Activity Tolerance  Patient tolerated treatment well    Behavior During Therapy  [facility] for tasks assessed/performed       Past Medical History:  Diagnosis Date  . Thyroid disease     Past Surgical History:  Procedure Laterality Date  . cholecystectomy    . FOOT SURGERY Bilateral   . HYSTERECTOMY ABDOMINAL WITH SALPINGECTOMY    . NASAL SINUS SURGERY      There were no vitals filed for this visit.  Subjective Assessment - 05/13/17 0717    Subjective  The cortisone injection made the shoulder sore. I had to pull traction on an extremity during surgery so I was more sore after that.     Currently in Pain?  Yes    Pain Score  2     Pain Location  Shoulder    Pain Orientation  Right    Pain Descriptors / Indicators  Aching    Aggravating Factors   pulling traction on an extremity during surgery     Pain Relieving Factors  rest          [facility] PT Assessment - 05/14/17 0001      PROM   Left Shoulder Flexion  148 Degrees    Left Shoulder Internal Rotation  80 Degrees    Left Shoulder External Rotation  45 Degrees                  [facility] Adult PT Treatment/Exercise - 05/14/17 0001      Therapeutic Activites    Therapeutic Activities  Work [employer]    Work Theatre manager   Exercises  Other Exercises    Other Exercises   warmup options at home              PT Education - 05/14/17 [phone]    Education provided  Yes    Education Details  goals, HEP, ergonomics with patients in OR    Person(s) Educated  Patient    Methods  Explanation;Demonstration;Tactile cues;Verbal cues    Comprehension  Verbalized understanding;Returned demonstration;Verbal cues required;Tactile cues required       PT Short Term Goals - 04/08/17 0731      PT SHORT TERM GOAL #1   Title  Pt will demo PROM in Left GHJ equal to R    Baseline  see flowsheet    Time  4    Period  Weeks    Status  On-going      PT SHORT TERM GOAL #2   Title  Pt will demo AAROM of shoulder without increase in shoulder pain    Baseline  no pain with AAROM    Time  4    Period  Weeks    Status  Achieved  PT SHORT TERM GOAL #3   Title  50% improvement in ability to sleep at night    Time  4    Period  Weeks    Status  Achieved        PT Long Term Goals - 05/14/17 0801      PT LONG TERM GOAL #1   Title  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 shoulder pain    Baseline  able with some fatigue    Status  Achieved      PT LONG TERM GOAL #2   Title  Pt will be able to return to riding bike without increase in shoulder pain for continued exercise    Baseline  has not tried    Status  Unable to assess      PT LONG TERM GOAL #3   Title  FOTO to 39% limitation to indicate significant improvement in functional ability    Baseline  37% limited     Status  Achieved      PT LONG TERM GOAL #4   Title  Pt will be able to complete all self care and household chores without limitation by shoulder    Baseline  able    Status  Achieved            Plan - 05/14/17 0840    Clinical Impression Statement  Pt has made significant progress since beginning PT and is prepared for d/c to independent program. Discussed ergonomics today when moving patients in OR to decrease risk of future injury. Pt was encouraged to contact us with any  further questions.    PT Treatment/Interventions  ADLs/Self Care Home Management;Cryotherapy;Electrical Stimulation;Functional mobility training;Traction;Moist Heat;Therapeutic activities;Therapeutic exercise;Neuromuscular re-education;Patient/family education;Passive range of motion;Manual techniques;Dry needling;Taping    PT Home Exercise Plan  median nerve glide, stretches: upper trap, levator, scalene; Supine chest press, pullovers, ER with wand; sidelying ER & abd, row & punch yellow tband; IR wand behind back; serratus press, CKC on table circles/rocking, horiz abd red tband; wall walks yellow tband, prone flx & abd, yellow band IR/ER,  doorway stretch.     Consulted and Agree with Plan of Care  Patient       Patient will benefit from skilled therapeutic intervention in order to improve the following deficits and impairments:  Decreased range of motion, Impaired UE functional use, Increased muscle spasms, Decreased activity tolerance, Pain, Improper body mechanics, Impaired flexibility, Decreased strength, Postural dysfunction  Visit Diagnosis: Muscle weakness (generalized)  Acute pain of left shoulder     Problem List Patient Active Problem List   Diagnosis Date Noted  . Skier's thumb, right, subsequent encounter 03/15/2017  . Chronic left shoulder pain 02/08/2017  . Nondisplaced fracture of greater tuberosity of left humerus, initial encounter for closed fracture 01/18/2017  . Routine adult health maintenance 09/24/2016  . Hypothyroidism 09/24/2016    Najah Liverman C. Averey Koning PT, DPT 05/14/17 8:42 AM   [place] [facility] [address] [place], [place], [address] Phone: [phone]   Fax:  [phone]  Name: Kenndra Morris MRN: [id number] Date of Birth: 11-27-1960

## 2017-05-21 MED ORDER — TRIAMCINOLONE ACETONIDE 40 MG/ML IJ SUSP
80.0000 mg | INTRAMUSCULAR | Status: AC | PRN
  Administered 2017-05-10: 80 mg via INTRA_ARTICULAR

## 2017-05-21 MED ORDER — BUPIVACAINE HCL 0.5 % IJ SOLN
3.0000 mL | INTRAMUSCULAR | Status: AC | PRN
  Administered 2017-05-10: 3 mL via INTRA_ARTICULAR

## 2017-05-26 MED FILL — VENLAFAXINE HCL ER 150 MG C: 150 | 30 days supply | Qty: 30 | Fill #4

## 2017-06-03 DIAGNOSIS — Z1389 Encounter for screening for other disorder: Secondary | ICD-10-CM | POA: Diagnosis not present

## 2017-06-03 DIAGNOSIS — J209 Acute bronchitis, unspecified: Secondary | ICD-10-CM | POA: Diagnosis not present

## 2017-06-03 DIAGNOSIS — J019 Acute sinusitis, unspecified: Secondary | ICD-10-CM | POA: Diagnosis not present

## 2017-06-03 DIAGNOSIS — R05 Cough: Secondary | ICD-10-CM | POA: Diagnosis not present

## 2017-06-03 DIAGNOSIS — Z6829 Body mass index (BMI) 29.0-29.9, adult: Secondary | ICD-10-CM | POA: Diagnosis not present

## 2017-06-29 MED FILL — CELECOXIB 200 MG CAP: 200 | 15 days supply | Qty: 30 | Fill #1

## 2017-06-29 MED FILL — VENLAFAXINE HCL ER 150 MG C: 150 | 30 days supply | Qty: 30 | Fill #5

## 2017-07-07 ENCOUNTER — Encounter: Payer: Self-pay | Admitting: Family

## 2017-07-07 ENCOUNTER — Encounter: Payer: Self-pay | Admitting: Physician Assistant

## 2017-07-07 ENCOUNTER — Ambulatory Visit: Payer: 59 | Admitting: Family

## 2017-07-07 VITALS — BP 118/80 | HR 98 | Temp 97.6°F | Ht 63.0 in | Wt 167.1 lb

## 2017-07-07 DIAGNOSIS — K219 Gastro-esophageal reflux disease without esophagitis: Secondary | ICD-10-CM

## 2017-07-07 MED ORDER — PANTOPRAZOLE SODIUM 40 MG PO TBEC: 40.0000 mg | DELAYED_RELEASE_TABLET | Freq: Two times a day (BID) | ORAL | 1 refills | Status: DC

## 2017-07-07 MED FILL — PANTOPRAZOLE SOD DR 40 MG T: 40 | 30 days supply | Qty: 60 | Fill #0

## 2017-07-07 NOTE — Progress Notes (Signed)
Alisha Reed is a 57 y.o. female with the following history as recorded in EpicCare:  Patient Active Problem List   Diagnosis Date Noted  . Skier's thumb, right, subsequent encounter 03/15/2017  . Chronic left shoulder pain 02/08/2017  . Nondisplaced fracture of greater tuberosity of left humerus, initial encounter for closed fracture 01/18/2017  . Routine adult health maintenance 09/24/2016  . Hypothyroidism 09/24/2016    Current Outpatient Medications  Medication Sig Dispense Refill  . celecoxib (CELEBREX) 200 MG capsule TAKE ONE CAPSULE BY MOUTH TWICE A DAY 30 capsule 3  . levothyroxine (SYNTHROID, LEVOTHROID) 75 MCG tablet Take 1 tablet (75 mcg total) by mouth daily before breakfast. 7 tablet 0  . methocarbamol (ROBAXIN) 750 MG tablet Take 1 tablet (750 mg total) by mouth 2 (two) times daily as needed for muscle spasms. 60 tablet 0  . traMADol (ULTRAM) 50 MG tablet Take 1-2 tablets (50-100 mg total) 3 (three) times daily as needed by mouth. 30 tablet 2  . venlafaxine XR (EFFEXOR-XR) 75 MG 24 hr capsule Take 1 capsule (75 mg total) by mouth daily. 90 capsule 1  . oxyCODONE-acetaminophen (PERCOCET) 5-325 MG tablet Take 1-2 tablets by mouth every 4 (four) hours as needed for severe pain. (Patient not taking: Reported on 03/15/2017) 40 tablet 0  . pantoprazole (PROTONIX) 40 MG tablet Take 1 tablet (40 mg total) by mouth 2 (two) times daily. 60 tablet 1   No current facility-administered medications for this visit.     Allergies: Patient has no known allergies.  Past Medical History:  Diagnosis Date  . Thyroid disease     Past Surgical History:  Procedure Laterality Date  . cholecystectomy    . FOOT SURGERY Bilateral   . HYSTERECTOMY ABDOMINAL WITH SALPINGECTOMY    . NASAL SINUS SURGERY      Family History  Problem Relation Age of Onset  . Scleroderma Mother   . Rheum arthritis Mother   . Heart disease Mother   . Heart disease Father   . Glaucoma Father   . Stomach cancer  Maternal Grandmother   . Heart attack Maternal Grandfather   . Alzheimer's disease Paternal Grandmother   . Throat cancer Paternal Grandfather     Social History   Tobacco Use  . Smoking status: Never Smoker  . Smokeless tobacco: Never Used  Substance Use Topics  . Alcohol use: Yes    Comment: Scoial    Subjective:  History of GERD "for years"; notes that symptoms have been much worse in the past 4 weeks; using Ranitidine bid; has history of esophageal spasm; does have sensation that certain foods like rice and spaghetti get stuck; denies any coffee grounds emesis; no changes in bowel habits- last colonoscopy in 2013 and thinks last endoscopy approximately 10 years ago; has taken Prevacid in the past but not currently on prescriptive medication; s/p cholecystectomy; father had severe GERD and required Nissen fundoplication x 2 but no prior FH of Baretts;  Objective:  Vitals:   07/07/17 1415  BP: 118/80  Pulse: 98  Temp: 97.6 F (36.4 C)  TempSrc: Oral  SpO2: 99%  Weight: 167 lb 1.9 oz (75.8 kg)  Height: 5\' 3"  (1.6 m)    General: Well developed, well nourished, in no acute distress  Skin : Warm and dry.  Head: Normocephalic and atraumatic  Lungs: Respirations unlabored; clear to auscultation bilaterally without wheeze, rales, rhonchi  CVS exam: normal rate and regular rhythm.  Abdomen: Soft; nontender; nondistended; normoactive bowel sounds; no masses  or hepatosplenomegaly  Neurologic: Alert and oriented; speech intact; face symmetrical; moves all extremities well; CNII-XII intact without focal deficit  Assessment:  1. Gastroesophageal reflux disease, esophagitis presence not specified     Plan:  Trial of Protonix 40 mg bid x 7-10 days and then decrease to qd; will refer to GI for further evaluation and upper endoscopy; discussed need to work on weight loss; trigger foods discussed; follow-up as needed if symptoms change/ worsen before able to see GI; keep planned appointment  for CPE.    No Follow-up on file.  Orders Placed This Encounter  Procedures  . Ambulatory referral to Gastroenterology    Referral Priority:   Routine    Referral Type:   Consultation    Referral Reason:   Specialty Services Required    Number of Visits Requested:   1    Requested Prescriptions   Signed Prescriptions Disp Refills  . pantoprazole (PROTONIX) 40 MG tablet 60 tablet 1    Sig: Take 1 tablet (40 mg total) by mouth 2 (two) times daily.

## 2017-07-07 NOTE — Patient Instructions (Signed)

## 2017-07-12 MED FILL — CELECOXIB 200 MG CAP: 200 | 15 days supply | Qty: 30 | Fill #2

## 2017-07-12 MED FILL — LEVOTHYROXINE 75 MCG TABLET: 75 | 90 days supply | Qty: 90 | Fill #1

## 2017-07-27 MED FILL — VENLAFAXINE HCL ER 150 MG C: 150 | 30 days supply | Qty: 30 | Fill #6

## 2017-08-02 ENCOUNTER — Ambulatory Visit: Payer: 59 | Admitting: Physician Assistant

## 2017-08-02 ENCOUNTER — Encounter: Payer: Self-pay | Admitting: Physician Assistant

## 2017-08-02 VITALS — BP 106/72 | HR 80 | Ht 63.0 in | Wt 168.0 lb

## 2017-08-02 DIAGNOSIS — R1310 Dysphagia, unspecified: Secondary | ICD-10-CM

## 2017-08-02 DIAGNOSIS — Z9049 Acquired absence of other specified parts of digestive tract: Secondary | ICD-10-CM

## 2017-08-02 DIAGNOSIS — K589 Irritable bowel syndrome without diarrhea: Secondary | ICD-10-CM

## 2017-08-02 DIAGNOSIS — K219 Gastro-esophageal reflux disease without esophagitis: Secondary | ICD-10-CM

## 2017-08-02 MED ORDER — PANTOPRAZOLE SODIUM 40 MG PO TBEC: 40.0000 mg | DELAYED_RELEASE_TABLET | Freq: Every day | ORAL | 11 refills | Status: DC

## 2017-08-02 MED ORDER — GLYCOPYRROLATE 2 MG PO TABS: 2.0000 mg | ORAL_TABLET | Freq: Every day | ORAL | 6 refills | Status: DC

## 2017-08-02 MED FILL — CELECOXIB 200 MG CAP: 200 | 15 days supply | Qty: 30 | Fill #3

## 2017-08-02 MED FILL — GLYCOPYRROLATE 2 MG TABLET: 2 | 30 days supply | Qty: 30 | Fill #0

## 2017-08-02 MED FILL — PANTOPRAZOLE SOD DR 40 MG T: 40 | 30 days supply | Qty: 30 | Fill #0

## 2017-08-02 NOTE — Progress Notes (Signed)
Subjective:    Patient ID: Alisha Reed, female    DOB: December 22, 1960, 57 y.o.   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  She is referred today by Alisha Mourning NP/primary care for evaluation of GERD. Patient says that she was having terrible symptoms for about a month, and taking a bottle of Tums per week.  She was very frequently waking up with acid reflux and a sensation of fluid in her mouth, and and intermittent vomiting of acidic material.  The majority of her symptoms were occurring early in the morning.  She says she was also having heartburn and indigestion during the day.  She has now been on Protonix 40 mg twice daily over the past 3 weeks says that her symptoms are much improved.  She is also been on an antireflux diet, has significantly cut back on caffeine and stop drinking sodas.  She says she is definitely sleeping much better. She says that for years she has had a sensation of dysphasia with rice and spaghetti but only if she is drinking cold liquids at the same time is eating.  Otherwise she has no complaints of solid food or liquid dysphagia.  With cold liquids she feels that she gets spasms and occasionally actually has to regurgitate. Patient had remote EGD with Dr. Docia Furl in [place], greater than 15 years ago and patient says she was told she had a small hiatal hernia.  She also had a colonoscopy 8 and 10 years ago per Dr. Docia Furl which she says was negative. Family history is negative for colon cancer or polyps.  Mother did have scleroderma involving her esophagus. She has another GI issue, which is occasional severe urgency for bowel movements and incontinence.  Generally this may occur 3-4 times a year and she invariably will wind up having an accident.  She says sometimes she only gets seconds of warning and generally will pass a lot of loose stool.  She is been unable to figure out any specific  triggers other than that it tends to happen if she has not eaten breakfast and then eats a larger lunch.  In between these episodes she generally tends more towards constipation.  Her last episode was in January.  She thought that having her gallbladder removed, perhaps would stop these episodes but made no difference.  Review of Systems Pertinent positive and negative review of systems were noted in the above HPI section.  All other review of systems was otherwise negative.  Outpatient Encounter Medications as of 08/02/2017  Medication Sig  . celecoxib (CELEBREX) 200 MG capsule TAKE ONE CAPSULE BY MOUTH TWICE A DAY  . glycopyrrolate (ROBINUL) 2 MG tablet Take 1 tablet (2 mg total) by mouth daily. Take every morning.  Marland Kitchen levothyroxine (SYNTHROID, LEVOTHROID) 75 MCG tablet Take 1 tablet (75 mcg total) by mouth daily before breakfast.  . pantoprazole (PROTONIX) 40 MG tablet Take 1 tablet (40 mg total) by mouth 2 (two) times daily.  . pantoprazole (PROTONIX) 40 MG tablet Take 1 tablet (40 mg total) by mouth daily. Take before dinner.  . traMADol (ULTRAM) 50 MG tablet Take 1-2 tablets (50-100 mg total) 3 (three) times daily as needed by mouth.  . venlafaxine XR (EFFEXOR-XR) 75 MG 24 hr capsule Take 1 capsule (75 mg total) by mouth daily.  . [DISCONTINUED] methocarbamol (ROBAXIN) 750 MG tablet Take 1 tablet (750 mg total) by mouth 2 (two)  times daily as needed for muscle spasms.  . [DISCONTINUED] oxyCODONE-acetaminophen (PERCOCET) 5-325 MG tablet Take 1-2 tablets by mouth every 4 (four) hours as needed for severe pain. (Patient not taking: Reported on 03/15/2017)   No facility-administered encounter medications on file as of 08/02/2017.    No Known Allergies Patient Active Problem List   Diagnosis Date Noted  . Skier's thumb, right, subsequent encounter 03/15/2017  . Chronic left shoulder pain 02/08/2017  . Nondisplaced fracture of greater tuberosity of left humerus, initial encounter for closed  fracture 01/18/2017  . Routine adult health maintenance 09/24/2016  . Hypothyroidism 09/24/2016   Social History   Socioeconomic History  . Marital status: Married    Spouse name: Not on file  . Number of children: 2  . Years of education: 33  . Highest education level: Not on file  Occupational History  . Occupation: Therapist, sports  Social Needs  . Financial resource strain: Not on file  . Food insecurity:    Worry: Not on file    Inability: Not on file  . Transportation needs:    Medical: Not on file    Non-medical: Not on file  Tobacco Use  . Smoking status: Never Smoker  . Smokeless tobacco: Never Used  Substance and Sexual Activity  . Alcohol use: Yes    Comment: Scoial  . Drug use: No  . Sexual activity: Yes    Birth control/protection: Surgical  Lifestyle  . Physical activity:    Days per week: Not on file    Minutes per session: Not on file  . Stress: Not on file  Relationships  . Social connections:    Talks on phone: Not on file    Gets together: Not on file    Attends religious service: Not on file    Active member of club or organization: Not on file    Attends meetings of clubs or organizations: Not on file    Relationship status: Not on file  . Intimate partner violence:    Fear of current or ex partner: Not on file    Emotionally abused: Not on file    Physically abused: Not on file    Forced sexual activity: Not on file  Other Topics Concern  . Not on file  Social History Narrative   Fun/Hobby - Concerts, camping, grandchildren    [name]'s family history includes Alzheimer's disease in her paternal grandmother; Glaucoma in her father; Heart attack in her maternal grandfather; Heart disease in her father and mother; Rheum arthritis in her mother; Scleroderma in her mother; Stomach cancer in her maternal grandmother; Throat cancer in her paternal grandfather.      Objective:    Vitals:   08/02/17 0908  BP: 106/72  Pulse: 80    Physical Exam;  developed white female in no acute distress, pleasant blood pressure 106/72 pulse 80, height 5 foot 3, weight 168, BMI 29.7.  HEENT; nontraumatic normocephalic EOMI PERRLA sclera anicteric, Cardiovascular; regular rate and rhythm with S1-S2 no murmur rub or gallop, Pulmonary; clear bilaterally, Abdomen ;soft, nontender nondistended bowel sounds are active no palpable mass or hepatosplenomegaly, Rectal ;exam not done, Extremity's ;no clubbing cyanosis or edema skin warm and dry, Neuro psych; mood and affect appropriate       Assessment & Plan:   #74 57 year old white female with recent exacerbation of GERD, with symptoms daytime and nocturnally, and with nighttime sour brash. Much improved since starting twice daily Protonix 40 mg a couple of weeks ago #  2 very sporadic symptoms consistent with esophageal spasm with dysphasia associated with very cold liquids #3 intermittent fecal urgency and incontinence, sporadic and occurring 3-4 times per year.  Patient unaware of any specific triggers #4 colon cancer surveillance-last colonoscopy about 8 years ago Dr. Docia Furl and negative-average risk #5 status post cholecystectomy  Plan; Continue Protonix 40 mg p.o. twice daily x1 more month then decrease to once daily before meals dinner Continue antireflux diet, we also discussed elevation of the head of the bed at least 45 degrees or sleeping with a wedge pillow which she has just started.  In addition n.p.o. for 2-3 hours prior to bedtime Patient will be placed for recall: With Dr. Havery Moros 2021 We will give her a trial of Robinul Forte 2 mg p.o. every morning to see if this helps abort her sporadic episodes of fecal urgency. She will follow-up with Dr. Havery Moros or myself on an as-needed basis.  Maliek Schellhorn S Eurydice Calixto PA-C 08/02/2017   Cc: Marrian Salvage,*

## 2017-08-02 NOTE — Patient Instructions (Addendum)
If you are age 59 or older, your body mass index should be between 23-30. Your Body mass index is 29.76 kg/m. If this is out of the aforementioned range listed, please consider follow up with your Primary Care Provider.  If you are age 60 or younger, your body mass index should be between 19-25. Your Body mass index is 29.76 kg/m. If this is out of the aformentioned range listed, please consider follow up with your Primary Care Provider.   Continue current prescription for Protonix twice a day until medication is gone. Then pick up your new prescription at your pharmacy for Protonix 40mg  by mouth before dinner  Information given on Anti-reflux.  You have been put in for a Colonoscopy Recall for 2021 with Dr. Havery Moros.  Follow-up With Amy Esterwood in One year or sooner if needed.

## 2017-08-02 NOTE — Progress Notes (Signed)
Agree with assessment and plan as outlined.  

## 2017-08-20 ENCOUNTER — Other Ambulatory Visit (INDEPENDENT_AMBULATORY_CARE_PROVIDER_SITE_OTHER): Payer: Self-pay | Admitting: Orthopaedic Surgery

## 2017-08-20 MED FILL — CELECOXIB 200 MG CAP: 200 | 15 days supply | Qty: 30 | Fill #0

## 2017-08-20 NOTE — Telephone Encounter (Signed)
Ok to refill 

## 2017-08-20 NOTE — Telephone Encounter (Signed)
pls advise

## 2017-09-01 MED FILL — VENLAFAXINE HCL ER 150 MG C: 150 | 30 days supply | Qty: 30 | Fill #7

## 2017-09-01 MED FILL — PANTOPRAZOLE SOD DR 40 MG T: 40 | 30 days supply | Qty: 30 | Fill #1

## 2017-09-08 MED FILL — CELECOXIB 200 MG CAP: 200 | 15 days supply | Qty: 30 | Fill #1

## 2017-09-09 DIAGNOSIS — H5203 Hypermetropia, bilateral: Secondary | ICD-10-CM | POA: Diagnosis not present

## 2017-09-09 DIAGNOSIS — H2513 Age-related nuclear cataract, bilateral: Secondary | ICD-10-CM | POA: Diagnosis not present

## 2017-09-24 MED FILL — CELECOXIB 200 MG CAP: 200 | 30 days supply | Qty: 60 | Fill #2

## 2017-09-28 ENCOUNTER — Encounter: Payer: 59 | Admitting: Nurse Practitioner

## 2017-09-29 MED FILL — VENLAFAXINE HCL ER 150 MG C: 150 | 30 days supply | Qty: 30 | Fill #8

## 2017-10-05 ENCOUNTER — Ambulatory Visit (INDEPENDENT_AMBULATORY_CARE_PROVIDER_SITE_OTHER): Payer: 59

## 2017-10-05 ENCOUNTER — Ambulatory Visit (INDEPENDENT_AMBULATORY_CARE_PROVIDER_SITE_OTHER): Payer: 59 | Admitting: Orthopaedic Surgery

## 2017-10-05 DIAGNOSIS — G5601 Carpal tunnel syndrome, right upper limb: Secondary | ICD-10-CM | POA: Diagnosis not present

## 2017-10-05 DIAGNOSIS — M25512 Pain in left shoulder: Secondary | ICD-10-CM | POA: Diagnosis not present

## 2017-10-05 MED ORDER — LIDOCAINE HCL 1 % IJ SOLN
3.0000 mL | INTRAMUSCULAR | Status: AC | PRN
  Administered 2017-10-05: 3 mL

## 2017-10-05 MED ORDER — BUPIVACAINE HCL 0.5 % IJ SOLN
3.0000 mL | INTRAMUSCULAR | Status: AC | PRN
  Administered 2017-10-05: 3 mL via INTRA_ARTICULAR

## 2017-10-05 MED ORDER — METHYLPREDNISOLONE ACETATE 40 MG/ML IJ SUSP
40.0000 mg | INTRAMUSCULAR | Status: AC | PRN
  Administered 2017-10-05: 40 mg

## 2017-10-05 MED ORDER — METHYLPREDNISOLONE ACETATE 40 MG/ML IJ SUSP
40.0000 mg | INTRAMUSCULAR | Status: AC | PRN
  Administered 2017-10-05: 40 mg via INTRA_ARTICULAR

## 2017-10-05 MED ORDER — LIDOCAINE HCL 1 % IJ SOLN
1.0000 mL | INTRAMUSCULAR | Status: AC | PRN
  Administered 2017-10-05: 1 mL

## 2017-10-05 MED ORDER — BUPIVACAINE HCL 0.5 % IJ SOLN
1.0000 mL | INTRAMUSCULAR | Status: AC | PRN
  Administered 2017-10-05: 1 mL

## 2017-10-05 NOTE — Progress Notes (Signed)
Office Visit Note   Patient: Alisha Reed           Date of Birth: 16-Jan-1961           MRN: [id number] Visit Date: 10/05/2017              Requested by: Lance Sell, NP [place], [place] [id number] PCP: Lance Sell, NP   Assessment & Plan: Visit Diagnoses:  1. Left shoulder pain, unspecified chronicity   2. Right carpal tunnel syndrome     Plan: Impression is left shoulder pain and right carpal tunnel syndrome.  Subacromial injection and right carpal tunnel injections were performed today.  Patient instructed to let us know if she does not improve from the subacromial injection.  We will need to get an MRI if she does not improve.  Follow-Up Instructions: Return if symptoms worsen or fail to improve.   Orders:  Orders Placed This Encounter  Procedures  . XR Shoulder Left   No orders of the defined types were placed in this encounter.     Procedures: Large Joint Inj: L subacromial bursa on 10/05/2017 3:07 PM Indications: pain Details: 22 G needle  Arthrogram: No  Medications: 3 mL lidocaine 1 %; 3 mL bupivacaine 0.5 %; 40 mg methylPREDNISolone acetate 40 MG/ML Outcome: tolerated well, no immediate complications Patient was prepped and draped in the usual sterile fashion.   Hand/UE Inj: R carpal tunnel for carpal tunnel syndrome on 10/05/2017 3:07 PM Indications: pain Details: 25 G needle Medications: 1 mL lidocaine 1 %; 1 mL bupivacaine 0.5 %; 40 mg methylPREDNISolone acetate 40 MG/ML Outcome: tolerated well, no immediate complications Patient was prepped and draped in the usual sterile fashion.       Clinical Data: No additional findings.   Subjective: Chief Complaint  Patient presents with  . Left Shoulder - Pain    S/p fracture of the greater tuberosity left humerus 01/2017    Alisha Reed comes in today for left shoulder pain for 3 weeks.  She felt something pop in her shoulder when she lifted up a stepstool in the operating room.   Since then she has had pain that she takes Robaxin and tramadol for.  The pain is worse at night.  She denies any tingling.  Denies any real weakness.  She is also complaining of worsening right carpal tunnel symptoms.  She had a previous nerve conduction studies about 2 years ago which showed moderate carpal tunnel syndrome.   Review of Systems  Constitutional: Negative.   HENT: Negative.   Eyes: Negative.   Respiratory: Negative.   Cardiovascular: Negative.   Endocrine: Negative.   Musculoskeletal: Negative.   Neurological: Negative.   Hematological: Negative.   Psychiatric/Behavioral: Negative.   All other systems reviewed and are negative.    Objective: Vital Signs: There were no vitals taken for this visit.  Physical Exam  Constitutional: She is oriented to person, place, and time. She appears well-developed and well-nourished.  Pulmonary/Chest: Effort normal.  Neurological: She is alert and oriented to person, place, and time.  Skin: Skin is warm. Capillary refill takes less than 2 seconds.  Psychiatric: She has a normal mood and affect. Her behavior is normal. Judgment and thought content normal.  Nursing note and vitals reviewed.   Ortho Exam Right hand exam stable. Left shoulder exam shows normal strength with rotator cuff testing.  Positive impingement signs.  Negative biceps signs.  Negative crank test. Specialty Comments:  No specialty comments  available.  Imaging: Xr Shoulder Left  Result Date: 10/05/2017 No acute or structural abnormalities.  Fully healed greater tuberosity fracture    PMFS History: Patient Active Problem List   Diagnosis Date Noted  . S/P cholecystectomy 08/02/2017  . Skier's thumb, right, subsequent encounter 03/15/2017  . Chronic left shoulder pain 02/08/2017  . Nondisplaced fracture of greater tuberosity of left humerus, initial encounter for closed fracture 01/18/2017  . Routine adult health maintenance 09/24/2016  .  Hypothyroidism 09/24/2016   Past Medical History:  Diagnosis Date  . Hypercholesterolemia   . Thyroid disease     Family History  Problem Relation Age of Onset  . Scleroderma Mother   . Rheum arthritis Mother   . Heart disease Mother   . Heart disease Father   . Glaucoma Father   . Stomach cancer Maternal Grandmother   . Heart attack Maternal Grandfather   . Alzheimer's disease Paternal Grandmother   . Throat cancer Paternal Grandfather   . Colon cancer Neg Hx   . Liver cancer Neg Hx     Past Surgical History:  Procedure Laterality Date  . cholecystectomy    . FOOT SURGERY Bilateral   . HYSTERECTOMY ABDOMINAL WITH SALPINGECTOMY    . NASAL SINUS SURGERY     Social History   Occupational History  . Occupation: Therapist, sports  Tobacco Use  . Smoking status: Never Smoker  . Smokeless tobacco: Never Used  Substance and Sexual Activity  . Alcohol use: Yes    Comment: Scoial  . Drug use: No  . Sexual activity: Yes    Birth control/protection: Surgical

## 2017-10-08 ENCOUNTER — Other Ambulatory Visit: Payer: Self-pay | Admitting: Nurse Practitioner

## 2017-10-08 MED FILL — LEVOTHYROXINE 75 MCG TABLET: 75 | 30 days supply | Qty: 30 | Fill #0

## 2017-10-18 ENCOUNTER — Other Ambulatory Visit (INDEPENDENT_AMBULATORY_CARE_PROVIDER_SITE_OTHER): Payer: Self-pay | Admitting: Radiology

## 2017-10-18 DIAGNOSIS — M25512 Pain in left shoulder: Secondary | ICD-10-CM

## 2017-10-22 ENCOUNTER — Encounter (INDEPENDENT_AMBULATORY_CARE_PROVIDER_SITE_OTHER): Payer: Self-pay | Admitting: Orthopaedic Surgery

## 2017-10-26 ENCOUNTER — Ambulatory Visit
Admission: RE | Admit: 2017-10-26 | Discharge: 2017-10-26 | Disposition: A | Discharge status: Home / Self Care | Payer: 59 | Source: Ambulatory Visit | Attending: Orthopaedic Surgery | Admitting: Orthopaedic Surgery

## 2017-10-26 ENCOUNTER — Encounter: Payer: Self-pay | Admitting: Nurse Practitioner

## 2017-10-26 DIAGNOSIS — M75112 Incomplete rotator cuff tear or rupture of left shoulder, not specified as traumatic: Secondary | ICD-10-CM | POA: Diagnosis not present

## 2017-10-26 DIAGNOSIS — M25512 Pain in left shoulder: Secondary | ICD-10-CM

## 2017-10-27 ENCOUNTER — Other Ambulatory Visit (INDEPENDENT_AMBULATORY_CARE_PROVIDER_SITE_OTHER): Payer: Self-pay | Admitting: Physician Assistant

## 2017-10-27 ENCOUNTER — Other Ambulatory Visit (INDEPENDENT_AMBULATORY_CARE_PROVIDER_SITE_OTHER): Payer: Self-pay

## 2017-10-27 ENCOUNTER — Telehealth (INDEPENDENT_AMBULATORY_CARE_PROVIDER_SITE_OTHER): Payer: Self-pay

## 2017-10-27 ENCOUNTER — Encounter (INDEPENDENT_AMBULATORY_CARE_PROVIDER_SITE_OTHER): Payer: Self-pay

## 2017-10-27 DIAGNOSIS — G8929 Other chronic pain: Secondary | ICD-10-CM

## 2017-10-27 DIAGNOSIS — M25512 Pain in left shoulder: Principal | ICD-10-CM

## 2017-10-27 MED FILL — CELECOXIB 200 MG CAP: 200 | 30 days supply | Qty: 60 | Fill #0

## 2017-10-27 NOTE — Progress Notes (Signed)
Please set her up for left shoulder intraarticular injection with [facility] asap.  Thanks.

## 2017-10-27 NOTE — Telephone Encounter (Signed)
Faxed Work note to fax number  [phone] per patients request.

## 2017-10-28 ENCOUNTER — Encounter: Payer: Self-pay | Admitting: Nurse Practitioner

## 2017-11-01 ENCOUNTER — Other Ambulatory Visit (INDEPENDENT_AMBULATORY_CARE_PROVIDER_SITE_OTHER): Payer: Self-pay

## 2017-11-01 ENCOUNTER — Telehealth (INDEPENDENT_AMBULATORY_CARE_PROVIDER_SITE_OTHER): Payer: Self-pay | Admitting: Orthopaedic Surgery

## 2017-11-01 NOTE — Telephone Encounter (Signed)
Kathy at [facility] called and needs first and last name of you and Dr. Erlinda Hong in order to fill rx. Please call her back # [phone]

## 2017-11-02 MED FILL — VENLAFAXINE HCL ER 150 MG C: 150 | 30 days supply | Qty: 30 | Fill #9

## 2017-11-02 NOTE — Telephone Encounter (Signed)
Called pharmacy yesterday. Per pharmacist they needed my last name since that's the Law in [place] (first and last name of person calling in RX)

## 2017-11-03 ENCOUNTER — Ambulatory Visit (INDEPENDENT_AMBULATORY_CARE_PROVIDER_SITE_OTHER): Payer: 59 | Admitting: Orthopaedic Surgery

## 2017-11-04 ENCOUNTER — Other Ambulatory Visit (INDEPENDENT_AMBULATORY_CARE_PROVIDER_SITE_OTHER): Payer: 59

## 2017-11-04 ENCOUNTER — Ambulatory Visit (INDEPENDENT_AMBULATORY_CARE_PROVIDER_SITE_OTHER): Payer: 59 | Admitting: Nurse Practitioner

## 2017-11-04 ENCOUNTER — Encounter: Payer: Self-pay | Admitting: Nurse Practitioner

## 2017-11-04 VITALS — BP 124/80 | HR 92 | Temp 98.4°F | Resp 16 | Ht 63.0 in | Wt 167.8 lb

## 2017-11-04 DIAGNOSIS — E785 Hyperlipidemia, unspecified: Secondary | ICD-10-CM | POA: Insufficient documentation

## 2017-11-04 DIAGNOSIS — Z23 Encounter for immunization: Secondary | ICD-10-CM | POA: Diagnosis not present

## 2017-11-04 DIAGNOSIS — E559 Vitamin D deficiency, unspecified: Secondary | ICD-10-CM | POA: Insufficient documentation

## 2017-11-04 DIAGNOSIS — H6123 Impacted cerumen, bilateral: Secondary | ICD-10-CM

## 2017-11-04 DIAGNOSIS — E039 Hypothyroidism, unspecified: Secondary | ICD-10-CM

## 2017-11-04 DIAGNOSIS — Z0001 Encounter for general adult medical examination with abnormal findings: Secondary | ICD-10-CM

## 2017-11-04 DIAGNOSIS — R7309 Other abnormal glucose: Secondary | ICD-10-CM

## 2017-11-04 LAB — COMPREHENSIVE METABOLIC PANEL
ALT: 13 U/L (ref 0–35)
AST: 13 U/L (ref 0–37)
Albumin: 4.2 g/dL (ref 3.5–5.2)
Alkaline Phosphatase: 74 U/L (ref 39–117)
BUN: 16 mg/dL (ref 6–23)
CO2: 30 mEq/L (ref 19–32)
Calcium: 9.8 mg/dL (ref 8.4–10.5)
Chloride: 101 mEq/L (ref 96–112)
Creatinine, Ser: 0.77 mg/dL (ref 0.40–1.20)
GFR: 81.68 mL/min (ref >60.00)
Glucose, Bld: 89 mg/dL (ref 70–99)
Potassium: 4.6 mEq/L (ref 3.5–5.1)
Sodium: 138 mEq/L (ref 135–145)
Total Bilirubin: 0.3 mg/dL (ref 0.2–1.2)
Total Protein: 7.3 g/dL (ref 6.0–8.3)

## 2017-11-04 LAB — LIPID PANEL
Cholesterol: 231 mg/dL — ABNORMAL HIGH (ref 0–200)
HDL: 58 mg/dL (ref >39.00)
LDL Cholesterol: 147 mg/dL — ABNORMAL HIGH (ref 0–99)
NonHDL: 172.62
Total CHOL/HDL Ratio: 4
Triglycerides: 130 mg/dL (ref 0.0–149.0)
VLDL: 26 mg/dL (ref 0.0–40.0)

## 2017-11-04 LAB — CBC
HCT: 38.6 % (ref 36.0–46.0)
Hemoglobin: 13 g/dL (ref 12.0–15.0)
MCHC: 33.6 g/dL (ref 30.0–36.0)
MCV: 90.9 fl (ref 78.0–100.0)
Platelets: 368 10*3/uL (ref 150.0–400.0)
RBC: 4.24 Mil/uL (ref 3.87–5.11)
RDW: 14.3 % (ref 11.5–15.5)
WBC: 7.2 10*3/uL (ref 4.0–10.5)

## 2017-11-04 LAB — HEMOGLOBIN A1C: Hgb A1c MFr Bld: 6.4 % (ref 4.6–6.5)

## 2017-11-04 LAB — VITAMIN D 25 HYDROXY (VIT D DEFICIENCY, FRACTURES): VITD: 19.83 ng/mL — ABNORMAL LOW (ref 30.00–100.00)

## 2017-11-04 LAB — TSH: TSH: 0.35 u[IU]/mL (ref 0.35–4.50)

## 2017-11-04 NOTE — Patient Instructions (Addendum)
Please head downstairs for lab work/x-rays. If any of your test results are critically abnormal, you will be contacted right away. Otherwise, I will contact you within a week about your test results and any recommendations for abnormalities.  I will plan to see you back in 1 year for your annual physical, or sooner if needed.  It was nice to meet you today. Thanks for letting me take care of you.   Health Maintenance, Female Adopting a healthy lifestyle and getting preventive care can go a long way to promote health and wellness. Talk with your health care provider about what schedule of regular examinations is right for you. This is a good chance for you to check in with your provider about disease prevention and staying healthy. In between checkups, there are plenty of things you can do on your own. Experts have done a lot of research about which lifestyle changes and preventive measures are most likely to keep you healthy. Ask your health care provider for more information. Weight and diet Eat a healthy diet  Be sure to include plenty of vegetables, fruits, low-fat dairy products, and lean protein.  Do not eat a lot of foods high in solid fats, added sugars, or salt.  Get regular exercise. This is one of the most important things you can do for your health. ? Most adults should exercise for at least 150 minutes each week. The exercise should increase your heart rate and make you sweat (moderate-intensity exercise). ? Most adults should also do strengthening exercises at least twice a week. This is in addition to the moderate-intensity exercise.  Maintain a healthy weight  Body mass index (BMI) is a measurement that can be used to identify possible weight problems. It estimates body fat based on height and weight. Your health care provider can help determine your BMI and help you achieve or maintain a healthy weight.  For females 18 years of age and older: ? A BMI below 18.5 is  considered underweight. ? A BMI of 18.5 to 24.9 is normal. ? A BMI of 25 to 29.9 is considered overweight. ? A BMI of 30 and above is considered obese.  Watch levels of cholesterol and blood lipids  You should start having your blood tested for lipids and cholesterol at 59 years of age, then have this test every 5 years.  You may need to have your cholesterol levels checked more often if: ? Your lipid or cholesterol levels are high. ? You are older than 59 years of age. ? You are at high risk for heart disease.  Cancer screening Lung Cancer  Lung cancer screening is recommended for adults 66-66 years old who are at high risk for lung cancer because of a history of smoking.  A yearly low-dose CT scan of the lungs is recommended for people who: ? Currently smoke. ? Have quit within the past 15 years. ? Have at least a 30-pack-year history of smoking. A pack year is smoking an average of one pack of cigarettes a day for 1 year.  Yearly screening should continue until it has been 15 years since you quit.  Yearly screening should stop if you develop a health problem that would prevent you from having lung cancer treatment.  Breast Cancer  Practice breast self-awareness. This means understanding how your breasts normally appear and feel.  It also means doing regular breast self-exams. Let your health care provider know about any changes, no matter how small.  If you are  in your 65s or 30s, you should have a clinical breast exam (CBE) by a health care provider every 1-3 years as part of a regular health exam.  If you are 38 or older, have a CBE every year. Also consider having a breast X-ray (mammogram) every year.  If you have a family history of breast cancer, talk to your health care provider about genetic screening.  If you are at high risk for breast cancer, talk to your health care provider about having an MRI and a mammogram every year.  Breast cancer gene (BRCA) assessment  is recommended for women who have family members with BRCA-related cancers. BRCA-related cancers include: ? Breast. ? Ovarian. ? Tubal. ? Peritoneal cancers.  Results of the assessment will determine the need for genetic counseling and BRCA1 and BRCA2 testing.  Cervical Cancer Your health care provider may recommend that you be screened regularly for cancer of the pelvic organs (ovaries, uterus, and vagina). This screening involves a pelvic examination, including checking for microscopic changes to the surface of your cervix (Pap test). You may be encouraged to have this screening done every 3 years, beginning at age 87.  For women ages 34-65, health care providers may recommend pelvic exams and Pap testing every 3 years, or they may recommend the Pap and pelvic exam, combined with testing for human papilloma virus (HPV), every 5 years. Some types of HPV increase your risk of cervical cancer. Testing for HPV may also be done on women of any age with unclear Pap test results.  Other health care providers may not recommend any screening for nonpregnant women who are considered low risk for pelvic cancer and who do not have symptoms. Ask your health care provider if a screening pelvic exam is right for you.  If you have had past treatment for cervical cancer or a condition that could lead to cancer, you need Pap tests and screening for cancer for at least 20 years after your treatment. If Pap tests have been discontinued, your risk factors (such as having a new sexual partner) need to be reassessed to determine if screening should resume. Some women have medical problems that increase the chance of getting cervical cancer. In these cases, your health care provider may recommend more frequent screening and Pap tests.  Colorectal Cancer  This type of cancer can be detected and often prevented.  Routine colorectal cancer screening usually begins at 59 years of age and continues through 59 years of  age.  Your health care provider may recommend screening at an earlier age if you have risk factors for colon cancer.  Your health care provider may also recommend using home test kits to check for hidden blood in the stool.  A small camera at the end of a tube can be used to examine your colon directly (sigmoidoscopy or colonoscopy). This is done to check for the earliest forms of colorectal cancer.  Routine screening usually begins at age 23.  Direct examination of the colon should be repeated every 5-10 years through 59 years of age. However, you may need to be screened more often if early forms of precancerous polyps or small growths are found.  Skin Cancer  Check your skin from head to toe regularly.  Tell your health care provider about any new moles or changes in moles, especially if there is a change in a mole's shape or color.  Also tell your health care provider if you have a mole that is larger than the  size of a pencil eraser.  Always use sunscreen. Apply sunscreen liberally and repeatedly throughout the day.  Protect yourself by wearing long sleeves, pants, a wide-brimmed hat, and sunglasses whenever you are outside.  Heart disease, diabetes, and high blood pressure  High blood pressure causes heart disease and increases the risk of stroke. High blood pressure is more likely to develop in: ? People who have blood pressure in the high end of the normal range (130-139/85-89 mm Hg). ? People who are overweight or obese. ? People who are African American.  If you are 71-56 years of age, have your blood pressure checked every 3-5 years. If you are 48 years of age or older, have your blood pressure checked every year. You should have your blood pressure measured twice-once when you are at a hospital or clinic, and once when you are not at a hospital or clinic. Record the average of the two measurements. To check your blood pressure when you are not at a hospital or clinic, you  can use: ? An automated blood pressure machine at a pharmacy. ? A home blood pressure monitor.  If you are between 70 years and 50 years old, ask your health care provider if you should take aspirin to prevent strokes.  Have regular diabetes screenings. This involves taking a blood sample to check your fasting blood sugar level. ? If you are at a normal weight and have a low risk for diabetes, have this test once every three years after 59 years of age. ? If you are overweight and have a high risk for diabetes, consider being tested at a younger age or more often. Preventing infection Hepatitis B  If you have a higher risk for hepatitis B, you should be screened for this virus. You are considered at high risk for hepatitis B if: ? You were born in a country where hepatitis B is common. Ask your health care provider which countries are considered high risk. ? Your parents were born in a high-risk country, and you have not been immunized against hepatitis B (hepatitis B vaccine). ? You have HIV or AIDS. ? You use needles to inject street drugs. ? You live with someone who has hepatitis B. ? You have had sex with someone who has hepatitis B. ? You get hemodialysis treatment. ? You take certain medicines for conditions, including cancer, organ transplantation, and autoimmune conditions.  Hepatitis C  Blood testing is recommended for: ? Everyone born from [birth year] through 1967. ? Anyone with known risk factors for hepatitis C.  Sexually transmitted infections (STIs)  You should be screened for sexually transmitted infections (STIs) including gonorrhea and chlamydia if: ? You are sexually active and are younger than 59 years of age. ? You are older than 59 years of age and your health care provider tells you that you are at risk for this type of infection. ? Your sexual activity has changed since you were last screened and you are at an increased risk for chlamydia or gonorrhea. Ask your  health care provider if you are at risk.  If you do not have HIV, but are at risk, it may be recommended that you take a prescription medicine daily to prevent HIV infection. This is called pre-exposure prophylaxis (PrEP). You are considered at risk if: ? You are sexually active and do not regularly use condoms or know the HIV status of your partner(s). ? You take drugs by injection. ? You are sexually active with a partner  who has HIV.  Talk with your health care provider about whether you are at high risk of being infected with HIV. If you choose to begin PrEP, you should first be tested for HIV. You should then be tested every 3 months for as long as you are taking PrEP. Pregnancy  If you are premenopausal and you may become pregnant, ask your health care provider about preconception counseling.  If you may become pregnant, take 400 to 800 micrograms (mcg) of folic acid every day.  If you want to prevent pregnancy, talk to your health care provider about birth control (contraception). Osteoporosis and menopause  Osteoporosis is a disease in which the bones lose minerals and strength with aging. This can result in serious bone fractures. Your risk for osteoporosis can be identified using a bone density scan.  If you are 80 years of age or older, or if you are at risk for osteoporosis and fractures, ask your health care provider if you should be screened.  Ask your health care provider whether you should take a calcium or vitamin D supplement to lower your risk for osteoporosis.  Menopause may have certain physical symptoms and risks.  Hormone replacement therapy may reduce some of these symptoms and risks. Talk to your health care provider about whether hormone replacement therapy is right for you. Follow these instructions at home:  Schedule regular health, dental, and eye exams.  Stay current with your immunizations.  Do not use any tobacco products including cigarettes, chewing  tobacco, or electronic cigarettes.  If you are pregnant, do not drink alcohol.  If you are breastfeeding, limit how much and how often you drink alcohol.  Limit alcohol intake to no more than 1 drink per day for nonpregnant women. One drink equals 12 ounces of beer, 5 ounces of wine, or 1 ounces of hard liquor.  Do not use street drugs.  Do not share needles.  Ask your health care provider for help if you need support or information about quitting drugs.  Tell your health care provider if you often feel depressed.  Tell your health care provider if you have ever been abused or do not feel safe at home. This information is not intended to replace advice given to you by your health care provider. Make sure you discuss any questions you have with your health care provider. Document Released: 11/10/2010 Document Revised: 10/03/2015 Document Reviewed: 01/29/2015 Elsevier Interactive Patient Education  [employer].

## 2017-11-04 NOTE — Assessment & Plan Note (Signed)
-  USPSTF grade A and B recommendations reviewed with patient; age-appropriate recommendations, preventive care, screening tests, etc discussed and encouraged; healthy living and sunscreen use encouraged; see AVS for patient education given to patient. Advanced directives packet given. -Discussed importance of 150 minutes of physical activity weekly, eat 6 servings of fruit/vegetables daily and drink plenty of water and avoid sweet beverages.  -Follow up and care instructions discussed and provided in AVS.  -Reviewed Health Maintenance: Need for Tdap vaccination- Tdap vaccine greater than or equal to 7yo IM  Elevated glucose - Hemoglobin A1c; Future

## 2017-11-04 NOTE — Progress Notes (Signed)
Name: Alisha Reed   MRN: [id number]    DOB: [birth year]   Date:11/04/2017       Progress Note  Subjective  Chief Complaint  Chief Complaint  Patient presents with  . Establish Care    Not fasting    HPI Alisha Reed is establishing care with me as a new patient today, transferring from another provider in the same practice. She has been maintained on synthroid daily by her prior PCP for hypothyroidism, otherwise no other daily medications from PCP. She is currently following with orthopedics for shoulder pain, gastroenterology for GERD, and gynecology provider in [place] for routine womens health care s/p hysterectomy, maintained on effexor by GYN for hot flashes. Patient presents for annual CPE.  Diet, Exercise: currently under orthopedic care for shoulder pain Hopes to start increasing her exercise in upcoming months Not watching her diet right now- 12 hour shifts and drives an hour home, often eats fast food on the way home  USPSTF grade A and B recommendations  Depression: no concerns today Depression screen [facility] 2/9 09/24/2016  Decreased Interest 0  Down, Depressed, Hopeless 0  PHQ - 2 Score 0   Hypertension: BP Readings from Last 3 Encounters:  11/04/17 124/80  08/02/17 106/72  07/07/17 118/80   Obesity: Wt Readings from Last 3 Encounters:  11/04/17 167 lb 12.8 oz (76.1 kg)  08/02/17 168 lb (76.2 kg)  07/07/17 167 lb 1.9 oz (75.8 kg)   BMI Readings from Last 3 Encounters:  11/04/17 29.72 kg/m  08/02/17 29.76 kg/m  07/07/17 29.60 kg/m    Alcohol: occasional social drink Tobacco use: no, never  HIV, hep C: hep C screening done, declines HIV screening  STD testing and prevention (chl/gon/syphilis): no concerns, declines Intimate partner violence:denies  Vaccinations: TDAP given today  Advanced Care Planning: A voluntary discussion about advance care planning including the explanation and discussion of advance directives.  Discussed health care proxy and Living  will, and the patient DOES NOT  have a living will at present time. If patient does have living will, I have requested they bring this to the clinic to be scanned in to their chart.  Breast cancer: mammogram up to date, ordered by GYN provider  Lipids: lipid panel today, lipids were elevated last year but she wanted to work on diet and exercise prior to initiation of medications Lab Results  Component Value Date   CHOL 237 (H) 09/30/2016   Lab Results  Component Value Date   HDL 59.80 09/30/2016   Lab Results  Component Value Date   LDLCALC 158 (H) 09/30/2016   Lab Results  Component Value Date   TRIG 97.0 09/30/2016   Lab Results  Component Value Date   CHOLHDL 4 09/30/2016   No results found for: LDLDIRECT  Glucose: A1c today, no past hx diabetes Glucose, Bld  Date Value Ref Range Status  09/30/2016 104 (H) 70 - 99 mg/dL Final   Skin cancer: routinely wears sunscreen  Colorectal cancer: colonoscopy up to date. No personal or family history of colon ca, no abdominal pain, no bowel changes, no rectal bleeding  Aspirin: not indicated ECG: not indicated   Patient Active Problem List   Diagnosis Date Noted  . S/P cholecystectomy 08/02/2017  . Skier's thumb, right, subsequent encounter 03/15/2017  . Chronic left shoulder pain 02/08/2017  . Nondisplaced fracture of greater tuberosity of left humerus, initial encounter for closed fracture 01/18/2017  . Routine adult health maintenance 09/24/2016  . Hypothyroidism 09/24/2016  Past Surgical History:  Procedure Laterality Date  . cholecystectomy    . FOOT SURGERY Bilateral   . HYSTERECTOMY ABDOMINAL WITH SALPINGECTOMY    . NASAL SINUS SURGERY      Family History  Problem Relation Age of Onset  . Scleroderma Mother   . Rheum arthritis Mother   . Heart disease Mother   . Heart disease Father   . Glaucoma Father   . Stomach cancer Maternal Grandmother   . Heart attack Maternal Grandfather   . Alzheimer's  disease Paternal Grandmother   . Throat cancer Paternal Grandfather   . Colon cancer Neg Hx   . Liver cancer Neg Hx     Social History   Socioeconomic History  . Marital status: Married    Spouse name: Not on file  . Number of children: 2  . Years of education: 38  . Highest education level: Not on file  Occupational History  . Occupation: Therapist, sports  Social Needs  . Financial resource strain: Not on file  . Food insecurity:    Worry: Not on file    Inability: Not on file  . Transportation needs:    Medical: Not on file    Non-medical: Not on file  Tobacco Use  . Smoking status: Never Smoker  . Smokeless tobacco: Never Used  Substance and Sexual Activity  . Alcohol use: Yes    Comment: Scoial  . Drug use: No  . Sexual activity: Yes    Birth control/protection: Surgical  Lifestyle  . Physical activity:    Days per week: Not on file    Minutes per session: Not on file  . Stress: Not on file  Relationships  . Social connections:    Talks on phone: Not on file    Gets together: Not on file    Attends religious service: Not on file    Active member of club or organization: Not on file    Attends meetings of clubs or organizations: Not on file    Relationship status: Not on file  . Intimate partner violence:    Fear of current or ex partner: Not on file    Emotionally abused: Not on file    Physically abused: Not on file    Forced sexual activity: Not on file  Other Topics Concern  . Not on file  Social History Narrative   Fun/Hobby - Concerts, camping, grandchildren     Current Outpatient Medications:  .  celecoxib (CELEBREX) 200 MG capsule, TAKE 1 CAPSULE BY MOUTH TWICE A DAY, Disp: 30 capsule, Rfl: 3 .  glycopyrrolate (ROBINUL) 2 MG tablet, Take 1 tablet (2 mg total) by mouth daily. Take every morning., Disp: 30 tablet, Rfl: 6 .  levothyroxine (SYNTHROID, LEVOTHROID) 75 MCG tablet, Take 1 tablet (75 mcg total) by mouth daily before breakfast., Disp: 7 tablet, Rfl:  0 .  levothyroxine (SYNTHROID, LEVOTHROID) 75 MCG tablet, TAKE 1 TABLET BY MOUTH DAILY BEFORE BREAKFAST. MUST KEEP SCHEDULE APPT FOR FUTURE REFILLS, Disp: 30 tablet, Rfl: 0 .  traMADol (ULTRAM) 50 MG tablet, Take 1-2 tablets (50-100 mg total) 3 (three) times daily as needed by mouth., Disp: 30 tablet, Rfl: 2 .  venlafaxine XR (EFFEXOR-XR) 150 MG 24 hr capsule, Take 150 mg by mouth daily., Disp: , Rfl: 12  No Known Allergies   ROS  Constitutional: Negative for fever or weight change.  Respiratory: Negative for cough and shortness of breath.   Cardiovascular: Negative for chest pain or palpitations.  Gastrointestinal: Negative  for abdominal pain, no bowel changes.  Musculoskeletal: Negative for gait problem. Skin: Negative for rash.  Neurological: Negative for dizziness or headache.  No other specific complaints in a complete review of systems (except as listed in HPI above).   Objective  Vitals:   11/04/17 0955  BP: 124/80  Pulse: 92  Resp: 16  Temp: 98.4 F (36.9 C)  TempSrc: Oral  SpO2: 97%  Weight: 167 lb 12.8 oz (76.1 kg)  Height: 5\' 3"  (1.6 m)   Body mass index is 29.72 kg/m.  Physical Exam Vital signs reviewed. Constitutional: Patient appears well-developed and well-nourished. No distress.  HENT: Head: Normocephalic and atraumatic. Ears: B TMs obstructed by cerumen; Nose: Nose normal. Mouth/Throat: Oropharynx is clear and moist. No oropharyngeal exudate.  Eyes: Conjunctivae and EOM are normal. Pupils are equal, round, and reactive to light. No scleral icterus.  Neck: Normal range of motion. Neck supple. No cervical adenopathy. No thyromegaly present.  Cardiovascular: Normal rate, regular rhythm and normal heart sounds.  No murmur heard. No BLE edema. Distal pulses intact Pulmonary/Chest: Effort normal and breath sounds normal. No respiratory distress. Abdominal: Soft. Bowel sounds are normal, no distension. There is no tenderness. no masses Breast: defd to  GYN FEMALE GENITALIA:  defd to GYN Musculoskeletal: Normal range of motion,  No gross deformities Neurological: she is alert and oriented to person, place, and time. No cranial nerve deficit. Coordination, balance, strength, speech and gait are normal.  Skin: Skin is warm and dry. No rash noted. No erythema.  Psychiatric: Patient has a normal mood and affect. behavior is normal. Judgment and thought content normal.   Assessment & Plan RTC in 1 week for cerumen irrigation RTC in 1 year for CPE  Bilateral impacted cerumen Attempted irrigation of bilateral ear canals unsuccessfully. Patient tolerated well but earwax is hardened and unable to be removed Instructed to use OTC wax drops x 1 week to soften wax and RTC for repeated irrigation

## 2017-11-04 NOTE — Assessment & Plan Note (Signed)
Noted on chart review, she says she was on vitamin D supplement in the past but no longer taking Update labs F/U with further recommendations pending lab results - VITAMIN D 25 Hydroxy (Vit-D Deficiency, Fractures); Future

## 2017-11-04 NOTE — Assessment & Plan Note (Signed)
Maintained on synthroid 72mcg daily without noted adverse effects Reports daily compliance, will continue at current dosage and update labs F/U with further recommendations pending lab results - TSH; Future

## 2017-11-04 NOTE — Assessment & Plan Note (Signed)
Update labs F/U with further recommendations pending lab results She is not fasting-nutrigrain bar and 2 cups coffee with creamer this am - CBC; Future - Comprehensive metabolic panel; Future - Lipid panel; Future

## 2017-11-05 ENCOUNTER — Other Ambulatory Visit: Payer: Self-pay | Admitting: Nurse Practitioner

## 2017-11-05 ENCOUNTER — Other Ambulatory Visit: Payer: Self-pay | Admitting: Family

## 2017-11-05 MED ORDER — VITAMIN D (ERGOCALCIFEROL) 1.25 MG (50000 UNIT) PO CAPS: 50000.0000 [IU] | ORAL_CAPSULE | ORAL | 0 refills | Status: AC

## 2017-11-05 MED FILL — LEVOTHYROXINE 75 MCG TABLET: 75 | 30 days supply | Qty: 30 | Fill #0

## 2017-11-05 MED FILL — VIT D2 1.25 MG (50,000 UNIT: 1.25 MG | 84 days supply | Qty: 12 | Fill #0

## 2017-11-08 ENCOUNTER — Encounter: Payer: Self-pay | Admitting: Nurse Practitioner

## 2017-11-08 ENCOUNTER — Ambulatory Visit: Payer: 59 | Admitting: Nurse Practitioner

## 2017-11-08 VITALS — BP 124/78 | HR 77 | Temp 98.3°F | Resp 16 | Ht 63.0 in | Wt 167.0 lb

## 2017-11-08 DIAGNOSIS — H6122 Impacted cerumen, left ear: Secondary | ICD-10-CM | POA: Diagnosis not present

## 2017-11-08 NOTE — Progress Notes (Signed)
Name: Alisha Reed   MRN: [id number]    DOB: Feb 04, 1961   Date:11/08/2017       Progress Note  Subjective  Chief Complaint  Ear irrigation  HPI  MS Gladson returns today for ear lavage. She was seen here on 11/04/17 for CPE and bilateral lavage was unsuccessful. She was given instructions to use OTC debrox for 1 week and return to clinic for repeat lavage. She has been using the debrox as instructed, Right ear is no longer impacted No  fevers, pain, dizziness, nausea. Overall feels well today.  Patient Active Problem List   Diagnosis Date Noted  . Hyperlipidemia 11/04/2017  . Vitamin D deficiency 11/04/2017  . S/P cholecystectomy 08/02/2017  . Skier's thumb, right, subsequent encounter 03/15/2017  . Chronic left shoulder pain 02/08/2017  . Nondisplaced fracture of greater tuberosity of left humerus, initial encounter for closed fracture 01/18/2017  . Encounter for general adult medical examination with abnormal findings 09/24/2016  . Hypothyroidism 09/24/2016    Social History   Tobacco Use  . Smoking status: Never Smoker  . Smokeless tobacco: Never Used  Substance Use Topics  . Alcohol use: Yes    Comment: Scoial     Current Outpatient Medications:  .  celecoxib (CELEBREX) 200 MG capsule, TAKE 1 CAPSULE BY MOUTH TWICE A DAY, Disp: 30 capsule, Rfl: 3 .  glycopyrrolate (ROBINUL) 2 MG tablet, Take 1 tablet (2 mg total) by mouth daily. Take every morning., Disp: 30 tablet, Rfl: 6 .  levothyroxine (SYNTHROID, LEVOTHROID) 75 MCG tablet, Take 1 tablet (75 mcg total) by mouth daily before breakfast., Disp: 30 tablet, Rfl: 0 .  traMADol (ULTRAM) 50 MG tablet, Take 1-2 tablets (50-100 mg total) 3 (three) times daily as needed by mouth., Disp: 30 tablet, Rfl: 2 .  venlafaxine XR (EFFEXOR-XR) 150 MG 24 hr capsule, Take 150 mg by mouth daily., Disp: , Rfl: 12 .  Vitamin D, Ergocalciferol, (DRISDOL) 50000 units CAPS capsule, Take 1 capsule (50,000 Units total) by mouth every 7 (seven) days  for 12 doses., Disp: 12 capsule, Rfl: 0  No Known Allergies  ROS  No other specific complaints in a complete review of systems (except as listed in HPI above).  Objective  Vitals:   11/08/17 1024  BP: 124/78  Pulse: 77  Resp: 16  Temp: 98.3 F (36.8 C)  TempSrc: Oral  SpO2: 98%  Weight: 167 lb (75.8 kg)  Height: 5\' 3"  (1.6 m)    Body mass index is 29.58 kg/m.  Nursing Note and Vital Signs reviewed.  Physical Exam  Constitutional: Patient appears well-developed and well-nourished. No distress.  HEENT: head atraumatic, normocephalic, pupils equal and reactive to light, EOM's intact, TM's without erythema or bulging, left TM visualized after irrigation today, neck supple, oropharynx pink and moist without exudate Cardiovascular: Normal rate, regular rhythm, distal pulses intact. No BLE edema. Pulmonary/Chest: Effort normal, No respiratory distress or retractions. Neurological: She is alert and oriented to person, place, and time. Coordination, balance, strength, speech and gait are normal.  Skin: Skin is warm and dry. No rash noted. No erythema.  Psychiatric: Patient has a normal mood and affect. behavior is normal. Judgment and thought content normal.  Assessment & Plan RTC in 6 months for routine F/U- repeat lipid panel, vitamin D, a1c- abnormal on last weeks CPE  Impacted cerumen of left ear Ear lavage today successful, patient tolerated well Able to visualize TMs after lavage Discussed home management of earwax buildup and return precautions and printed information  in AVS F/U as needed

## 2017-11-08 NOTE — Patient Instructions (Addendum)
Please return in about 6 months for follow up   Earwax Buildup, Adult The ears produce a substance called earwax that helps keep bacteria out of the ear and protects the skin in the ear canal. Occasionally, earwax can build up in the ear and cause discomfort or hearing loss. What increases the risk? This condition is more likely to develop in people who:  Are female.  Are elderly.  Naturally produce more earwax.  Clean their ears often with cotton swabs.  Use earplugs often.  Use in-ear headphones often.  Wear hearing aids.  Have narrow ear canals.  Have earwax that is overly thick or sticky.  Have eczema.  Are dehydrated.  Have excess hair in the ear canal.  What are the signs or symptoms? Symptoms of this condition include:  Reduced or muffled hearing.  A feeling of fullness in the ear or feeling that the ear is plugged.  Fluid coming from the ear.  Ear pain.  Ear itch.  Ringing in the ear.  Coughing.  An obvious piece of earwax that can be seen inside the ear canal.  How is this diagnosed? This condition may be diagnosed based on:  Your symptoms.  Your medical history.  An ear exam. During the exam, your health care provider will look into your ear with an instrument called an otoscope.  You may have tests, including a hearing test. How is this treated? This condition may be treated by:  Using ear drops to soften the earwax.  Having the earwax removed by a health care provider. The health care provider may: ? Flush the ear with water. ? Use an instrument that has a loop on the end (curette). ? Use a suction device.  Surgery to remove the wax buildup. This may be done in severe cases.  Follow these instructions at home:  Take over-the-counter and prescription medicines only as told by your health care provider.  Do not put any objects, including cotton swabs, into your ear. You can clean the opening of your ear canal with a washcloth or  facial tissue.  Follow instructions from your health care provider about cleaning your ears. Do not over-clean your ears.  Drink enough fluid to keep your urine clear or pale yellow. This will help to thin the earwax.  Keep all follow-up visits as told by your health care provider. If earwax builds up in your ears often or if you use hearing aids, consider seeing your health care provider for routine, preventive ear cleanings. Ask your health care provider how often you should schedule your cleanings.  If you have hearing aids, clean them according to instructions from the manufacturer and your health care provider. Contact a health care provider if:  You have ear pain.  You develop a fever.  You have blood, pus, or other fluid coming from your ear.  You have hearing loss.  You have ringing in your ears that does not go away.  Your symptoms do not improve with treatment.  You feel like the room is spinning (vertigo). Summary  Earwax can build up in the ear and cause discomfort or hearing loss.  The most common symptoms of this condition include reduced or muffled hearing and a feeling of fullness in the ear or feeling that the ear is plugged.  This condition may be diagnosed based on your symptoms, your medical history, and an ear exam.  This condition may be treated by using ear drops to soften the earwax or by  having the earwax removed by a health care provider.  Do not put any objects, including cotton swabs, into your ear. You can clean the opening of your ear canal with a washcloth or facial tissue. This information is not intended to replace advice given to you by your health care provider. Make sure you discuss any questions you have with your health care provider. Document Released: 06/04/2004 Document Revised: 07/08/2016 Document Reviewed: 07/08/2016 Elsevier Interactive Patient Education  [employer].

## 2017-11-17 ENCOUNTER — Encounter (INDEPENDENT_AMBULATORY_CARE_PROVIDER_SITE_OTHER): Payer: Self-pay | Admitting: Physical Medicine and Rehabilitation

## 2017-11-17 ENCOUNTER — Encounter

## 2017-11-17 ENCOUNTER — Ambulatory Visit (INDEPENDENT_AMBULATORY_CARE_PROVIDER_SITE_OTHER): Payer: 59 | Admitting: Physical Medicine and Rehabilitation

## 2017-11-17 ENCOUNTER — Ambulatory Visit (INDEPENDENT_AMBULATORY_CARE_PROVIDER_SITE_OTHER): Payer: Self-pay

## 2017-11-17 DIAGNOSIS — M25512 Pain in left shoulder: Secondary | ICD-10-CM | POA: Diagnosis not present

## 2017-11-17 DIAGNOSIS — G8929 Other chronic pain: Secondary | ICD-10-CM | POA: Diagnosis not present

## 2017-11-17 NOTE — Patient Instructions (Signed)

## 2017-11-17 NOTE — Progress Notes (Signed)
 .  Numeric Pain Rating Scale and Functional Assessment Average Pain 7   In the last MONTH (on 0-10 scale) has pain interfered with the following?  1. General activity like being  able to carry out your everyday physical activities such as walking, climbing stairs, carrying groceries, or moving a chair?  Rating(5)  -Dye Allergies.  

## 2017-11-17 NOTE — Progress Notes (Signed)
Alisha Reed - 57 y.o. female MRN [id number]  Date of birth: 30-Aug-1960  Office Visit Note: Visit Date: 11/17/2017 PCP: Lance Sell, NP Referred by: Lance Sell, NP  Subjective: Chief Complaint  Patient presents with  . Left Shoulder - Pain  . Left Upper Arm - Pain   HPI: Alisha Reed comes in today at her request of Dr. Erlinda Hong for anesthetic arthrogram of the left glenohumeral joint.  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  She actually did well with that injection.  She now comes in today with MRI of the left shoulder which is reviewed below.  She is having a lot of pain and decreased range of motion.  She reports that 8 weeks ago she was lifting a stool at work and felt a popping sensation.   ROS Otherwise per HPI.  Assessment & Plan: Visit Diagnoses:  1. Chronic left shoulder pain     Plan: Findings:  Diagnostic and hopefully therapeutic anesthetic arthrogram of the glenohumeral joint.  Patient did have relief during the anesthetic phase.    Meds & Orders: No orders of the defined types were placed in this encounter.   Orders Placed This Encounter  Procedures  . Large Joint Inj: L glenohumeral  . XR C-ARM NO REPORT    Follow-up: Return if symptoms worsen or fail to improve.   Procedures: Large Joint Inj: L glenohumeral on 11/17/2017 9:17 AM Indications: pain, diagnostic evaluation and joint swelling Details: 22 G 3.5 in needle, anteromedial approach  Arthrogram: Yes  Medications: 3 mL bupivacaine 0.5 %; 80 mg triamcinolone acetonide 40 MG/ML  Arthrogram demonstrated excellent flow of contrast throughout the joint surface without extravasation or obvious defect.  The patient had relief of symptoms during the anesthetic phase of the injection.  Procedure, treatment alternatives, risks and benefits explained, specific risks discussed. Consent was given by the patient. Immediately prior to  procedure a time out was called to verify the correct patient, procedure, equipment, support staff and site/side marked as required. Patient was prepped and draped in the usual sterile fashion.      No notes on file   Clinical History: MRI Left Shoulder  IMPRESSION: 1. Mild supraspinatus tendinosis with small focal high-grade partial-thickness articular surface tear of the posterior mid tendon. 2. Mild intra-articular biceps tendinosis. 3. Nearly healed vertical fracture through the greater tuberosity.   Electronically Signed   By: Titus Dubin M.D.   On: 10/26/2017 08:32   She reports that she has never smoked. She has never used smokeless tobacco.  Recent Labs    11/04/17 1144  HGBA1C 6.4    Objective:  VS:  HT:    WT:   BMI:     BP:   HR: bpm  TEMP: ( )  RESP:  Physical Exam  Ortho Exam Imaging: Xr C-arm No Report  Result Date: 11/17/2017 Please see Notes tab for imaging impression.   Past Medical/Family/Surgical/Social History: Medications & Allergies reviewed per EMR, new medications updated. Patient Active Problem List   Diagnosis Date Noted  . Hyperlipidemia 11/04/2017  . Vitamin D deficiency 11/04/2017  . S/P cholecystectomy 08/02/2017  . Skier's thumb, right, subsequent encounter 03/15/2017  . Chronic left shoulder pain 02/08/2017  . Nondisplaced fracture of greater tuberosity of left humerus, initial encounter for closed fracture 01/18/2017  . Encounter for general adult medical examination with abnormal findings 09/24/2016  . Hypothyroidism 09/24/2016  Past Medical History:  Diagnosis Date  . Hypercholesterolemia   . Thyroid disease    Family History  Problem Relation Age of Onset  . Scleroderma Mother   . Rheum arthritis Mother   . Heart disease Mother   . Heart disease Father   . Glaucoma Father   . Stomach cancer Maternal Grandmother   . Heart attack Maternal Grandfather   . Alzheimer's disease Paternal Grandmother   . Throat  cancer Paternal Grandfather   . Colon cancer Neg Hx   . Liver cancer Neg Hx    Past Surgical History:  Procedure Laterality Date  . cholecystectomy    . FOOT SURGERY Bilateral   . HYSTERECTOMY ABDOMINAL WITH SALPINGECTOMY    . NASAL SINUS SURGERY     Social History   Occupational History  . Occupation: Therapist, sports  Tobacco Use  . Smoking status: Never Smoker  . Smokeless tobacco: Never Used  Substance and Sexual Activity  . Alcohol use: Yes    Comment: Scoial  . Drug use: No  . Sexual activity: Yes    Birth control/protection: Surgical

## 2017-11-18 MED ORDER — TRIAMCINOLONE ACETONIDE 40 MG/ML IJ SUSP
80.0000 mg | INTRAMUSCULAR | Status: AC | PRN
  Administered 2017-11-17: 80 mg via INTRA_ARTICULAR

## 2017-11-18 MED ORDER — BUPIVACAINE HCL 0.5 % IJ SOLN
3.0000 mL | INTRAMUSCULAR | Status: AC | PRN
  Administered 2017-11-17: 3 mL via INTRA_ARTICULAR

## 2017-11-30 ENCOUNTER — Other Ambulatory Visit: Payer: Self-pay | Admitting: Nurse Practitioner

## 2017-12-01 MED FILL — CELECOXIB 200 MG CAP: 200 | 30 days supply | Qty: 60 | Fill #1

## 2017-12-01 MED FILL — VENLAFAXINE HCL ER 150 MG C: 150 | 30 days supply | Qty: 30 | Fill #10

## 2017-12-05 ENCOUNTER — Encounter: Payer: Self-pay | Admitting: Nurse Practitioner

## 2017-12-06 ENCOUNTER — Other Ambulatory Visit: Payer: Self-pay

## 2017-12-06 ENCOUNTER — Encounter: Payer: Self-pay | Admitting: Nurse Practitioner

## 2017-12-06 MED ORDER — LEVOTHYROXINE SODIUM 75 MCG PO TABS: 75.0000 ug | ORAL_TABLET | Freq: Every day | ORAL | 1 refills | Status: DC

## 2017-12-06 MED FILL — LEVOTHYROXINE 75 MCG TABLET: 75 | 90 days supply | Qty: 90 | Fill #0

## 2017-12-15 ENCOUNTER — Encounter (INDEPENDENT_AMBULATORY_CARE_PROVIDER_SITE_OTHER): Payer: Self-pay | Admitting: Orthopaedic Surgery

## 2017-12-15 ENCOUNTER — Encounter (INDEPENDENT_AMBULATORY_CARE_PROVIDER_SITE_OTHER): Payer: Self-pay

## 2017-12-15 NOTE — Telephone Encounter (Signed)
Ok to do

## 2017-12-31 ENCOUNTER — Other Ambulatory Visit (INDEPENDENT_AMBULATORY_CARE_PROVIDER_SITE_OTHER): Payer: Self-pay | Admitting: Physician Assistant

## 2017-12-31 MED FILL — VENLAFAXINE HCL ER 150 MG C: 150 | 30 days supply | Qty: 30 | Fill #11

## 2017-12-31 MED FILL — CELECOXIB 200 MG CAP: 200 | 30 days supply | Qty: 60 | Fill #0

## 2018-01-14 ENCOUNTER — Ambulatory Visit (INDEPENDENT_AMBULATORY_CARE_PROVIDER_SITE_OTHER): Payer: 59

## 2018-01-14 DIAGNOSIS — Z23 Encounter for immunization: Secondary | ICD-10-CM | POA: Diagnosis not present

## 2018-02-02 ENCOUNTER — Encounter (INDEPENDENT_AMBULATORY_CARE_PROVIDER_SITE_OTHER): Payer: Self-pay | Admitting: Orthopaedic Surgery

## 2018-02-02 MED FILL — CELECOXIB 200 MG CAP: 200 | 30 days supply | Qty: 60 | Fill #1

## 2018-02-14 DIAGNOSIS — Z1231 Encounter for screening mammogram for malignant neoplasm of breast: Secondary | ICD-10-CM | POA: Diagnosis not present

## 2018-02-14 DIAGNOSIS — Z01419 Encounter for gynecological examination (general) (routine) without abnormal findings: Secondary | ICD-10-CM | POA: Diagnosis not present

## 2018-02-14 MED FILL — VENLAFAXINE HCL ER 37.5 MG: 37.5 | 30 days supply | Qty: 45 | Fill #0

## 2018-02-18 MED FILL — ESTRADIOL 0.5 MG TABLET: 0.5 | 90 days supply | Qty: 90 | Fill #0

## 2018-03-07 ENCOUNTER — Other Ambulatory Visit (INDEPENDENT_AMBULATORY_CARE_PROVIDER_SITE_OTHER): Payer: Self-pay | Admitting: Physician Assistant

## 2018-03-07 MED FILL — LEVOTHYROXINE 75 MCG TABLET: 75 | 90 days supply | Qty: 90 | Fill #1

## 2018-03-10 ENCOUNTER — Other Ambulatory Visit (INDEPENDENT_AMBULATORY_CARE_PROVIDER_SITE_OTHER): Payer: Self-pay | Admitting: Physician Assistant

## 2018-03-10 ENCOUNTER — Telehealth (INDEPENDENT_AMBULATORY_CARE_PROVIDER_SITE_OTHER): Payer: Self-pay | Admitting: Orthopaedic Surgery

## 2018-03-10 MED FILL — CELECOXIB 200 MG CAP: 200 | 30 days supply | Qty: 60 | Fill #0

## 2018-03-10 NOTE — Telephone Encounter (Signed)
Celebrex 200mg  take one tab once daily prn. #30 with 3 refills.  Not sure why they have questions?

## 2018-03-10 NOTE — Telephone Encounter (Signed)
Ok to refill 

## 2018-03-10 NOTE — Telephone Encounter (Signed)
[facility]  [phone]  Please confirm quantity  Pharmacy didn't leave the medication type on VM

## 2018-03-10 NOTE — Telephone Encounter (Signed)
Per lindsey ok BID #[id number]

## 2018-03-18 ENCOUNTER — Encounter (INDEPENDENT_AMBULATORY_CARE_PROVIDER_SITE_OTHER): Payer: Self-pay | Admitting: Physician Assistant

## 2018-03-18 ENCOUNTER — Ambulatory Visit (INDEPENDENT_AMBULATORY_CARE_PROVIDER_SITE_OTHER): Payer: 59

## 2018-03-18 ENCOUNTER — Ambulatory Visit (INDEPENDENT_AMBULATORY_CARE_PROVIDER_SITE_OTHER): Payer: 59 | Admitting: Orthopaedic Surgery

## 2018-03-18 DIAGNOSIS — M25511 Pain in right shoulder: Secondary | ICD-10-CM | POA: Diagnosis not present

## 2018-03-18 MED ORDER — METHOCARBAMOL 500 MG PO TABS: 500.0000 mg | ORAL_TABLET | Freq: Two times a day (BID) | ORAL | 0 refills | Status: DC | PRN

## 2018-03-18 MED ORDER — PREDNISONE 10 MG (21) PO TBPK: ORAL_TABLET | ORAL | 0 refills | Status: DC

## 2018-03-18 MED FILL — predniSONE 10 MG TABS: 10 | 6 days supply | Qty: 21 | Fill #0

## 2018-03-18 MED FILL — METHOCARBAMOL 500 MG TABS: 500 | 15 days supply | Qty: 30 | Fill #0

## 2018-03-18 NOTE — Progress Notes (Signed)
Office Visit Note   Patient: Alisha Reed           Date of Birth: [birth year]           MRN: [id number] Visit Date: 03/18/2018              Requested by: Lance Sell, NP [place], [place] [id number] PCP: Lance Sell, NP   Assessment & Plan: Visit Diagnoses:  1. Acute pain of right shoulder     Plan: At this point, I am really unsure that her symptoms are coming from her actual shoulder itself.  I think she may have some pick irritation and possibly cervical spine irritation.  We will start her on a Sterapred taper and muscle relaxers.  She will be in touch with [place] next week.  Follow-up as needed.  Follow-Up Instructions: Return if symptoms worsen or fail to improve.   Orders:  Orders Placed This Encounter  Procedures  . XR Shoulder Right   Meds ordered this encounter  Medications  . predniSONE (STERAPRED UNI-PAK 21 TAB) 10 MG (21) TBPK tablet    Sig: Take as directed    Dispense:  21 tablet    Refill:  0  . methocarbamol (ROBAXIN) 500 MG tablet    Sig: Take 1 tablet (500 mg total) by mouth 2 (two) times daily as needed for muscle spasms.    Dispense:  30 tablet    Refill:  0      Procedures: No procedures performed   Clinical Data: No additional findings.   Subjective: Chief Complaint  Patient presents with  . Right Shoulder - Pain    HPI [name] is a pleasant 57 year old right-hand-dominant female who presents to our clinic today with a new injury to the right upper extremity.  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  She was participating in ACLS class performing CPR on the [employer] this past Tuesday when her right hand slipped causing her to forcefully push down against her right upper extremity.  Since then she has had pain to the entire right shoulder radiating into the lateral aspect of her neck and into the parascapular region.  She also notes pain to the right pec and right flank.  This does appear  to be worse when using the right upper extremity or or even when she is taking deep breaths.  She has tried over-the-counter medications as well as muscle relaxer without relief of symptoms.  No new weakness or numbness, tingling or burning.  Review of Systems as detailed in HPI.  All others reviewed and are negative.   Objective: Vital Signs: There were no vitals taken for this visit.  Physical Exam well-developed well-nourished female in no acute distress.  Alert and oriented x3.  Ortho Exam examination of the right shoulder reveals full active range of motion.  She does have painful parascapular trigger points on the right.  Tenderness throughout the entire right shoulder.  Full strength.    Specialty Comments:  No specialty comments available.  Imaging: Xr Shoulder Right  Result Date: 03/18/2018 No acute or structural abnormalities    PMFS History: Patient Active Problem List   Diagnosis Date Noted  . Acute pain of right shoulder 03/18/2018  . Hyperlipidemia 11/04/2017  . Vitamin D deficiency 11/04/2017  . S/P cholecystectomy 08/02/2017  . Skier's thumb, right, subsequent encounter 03/15/2017  . Chronic left shoulder pain 02/08/2017  . Nondisplaced fracture of greater tuberosity of  left humerus, initial encounter for closed fracture 01/18/2017  . Encounter for general adult medical examination with abnormal findings 09/24/2016  . Hypothyroidism 09/24/2016   Past Medical History:  Diagnosis Date  . Hypercholesterolemia   . Thyroid disease     Family History  Problem Relation Age of Onset  . Scleroderma Mother   . Rheum arthritis Mother   . Heart disease Mother   . Heart disease Father   . Glaucoma Father   . Stomach cancer Maternal Grandmother   . Heart attack Maternal Grandfather   . Alzheimer's disease Paternal Grandmother   . Throat cancer Paternal Grandfather   . Colon cancer Neg Hx   . Liver cancer Neg Hx     Past Surgical History:  Procedure Laterality  Date  . cholecystectomy    . FOOT SURGERY Bilateral   . HYSTERECTOMY ABDOMINAL WITH SALPINGECTOMY    . NASAL SINUS SURGERY     Social History   Occupational History  . Occupation: Therapist, sports  Tobacco Use  . Smoking status: Never Smoker  . Smokeless tobacco: Never Used  Substance and Sexual Activity  . Alcohol use: Yes    Comment: Scoial  . Drug use: No  . Sexual activity: Yes    Birth control/protection: Surgical

## 2018-03-31 ENCOUNTER — Encounter (INDEPENDENT_AMBULATORY_CARE_PROVIDER_SITE_OTHER): Payer: Self-pay | Admitting: Orthopaedic Surgery

## 2018-04-08 ENCOUNTER — Ambulatory Visit: Payer: Self-pay

## 2018-04-08 ENCOUNTER — Ambulatory Visit: Payer: 59 | Admitting: Family

## 2018-04-08 ENCOUNTER — Encounter: Payer: Self-pay | Admitting: Family

## 2018-04-08 ENCOUNTER — Telehealth: Payer: 59 | Admitting: Family

## 2018-04-08 VITALS — BP 124/70 | HR 80 | Temp 98.0°F | Ht 63.0 in | Wt 162.0 lb

## 2018-04-08 DIAGNOSIS — K5792 Diverticulitis of intestine, part unspecified, without perforation or abscess without bleeding: Secondary | ICD-10-CM

## 2018-04-08 DIAGNOSIS — A049 Bacterial intestinal infection, unspecified: Secondary | ICD-10-CM

## 2018-04-08 MED ORDER — METRONIDAZOLE 500 MG PO TABS: 500.0000 mg | ORAL_TABLET | Freq: Three times a day (TID) | ORAL | 0 refills | Status: AC

## 2018-04-08 MED ORDER — ONDANSETRON 4 MG PO TBDP: 4.0000 mg | ORAL_TABLET | Freq: Three times a day (TID) | ORAL | 0 refills | Status: DC | PRN

## 2018-04-08 MED ORDER — CIPROFLOXACIN HCL 500 MG PO TABS: 500.0000 mg | ORAL_TABLET | Freq: Two times a day (BID) | ORAL | 0 refills | Status: AC

## 2018-04-08 MED ORDER — CIPROFLOXACIN HCL 500 MG PO TABS: 1000.0000 mg | ORAL_TABLET | Freq: Two times a day (BID) | ORAL | 0 refills | Status: DC

## 2018-04-08 NOTE — Telephone Encounter (Signed)
   Reason for Disposition . Abdominal pain  (Exception: Pain clears with each passage of diarrhea stool)  Answer Assessment - Initial Assessment Questions 1. DIARRHEA SEVERITY: "How bad is the diarrhea?" "How many extra stools have you had in the past 24 hours than normal?"    - NO DIARRHEA (SCALE 0)   - MILD (SCALE 1-3): Few loose or mushy BMs; increase of 1-3 stools over normal daily number of stools; mild increase in ostomy output.   -  MODERATE (SCALE 4-7): Increase of 4-6 stools daily over normal; moderate increase in ostomy output. * SEVERE (SCALE 8-10; OR 'WORST POSSIBLE'): Increase of 7 or more stools daily over normal; moderate increase in ostomy output; incontinence.     Soft with mucus 10 2. ONSET: "When did the diarrhea begin?"      Started last Saturday 3. BM CONSISTENCY: "How loose or watery is the diarrhea?"      Soft today  4. VOMITING: "Are you also vomiting?" If so, ask: "How many times in the past 24 hours?"      No 5. ABDOMINAL PAIN: "Are you having any abdominal pain?" If yes: "What does it feel like?" (e.g., crampy, dull, intermittent, constant)      Stabbing pain and inti rectum 6. ABDOMINAL PAIN SEVERITY: If present, ask: "How bad is the pain?"  (e.g., Scale 1-10; mild, moderate, or severe)   - MILD (1-3): doesn't interfere with normal activities, abdomen soft and not tender to touch    - MODERATE (4-7): interferes with normal activities or awakens from sleep, tender to touch    - SEVERE (8-10): excruciating pain, doubled over, unable to do any normal activities       Low abdomen -  8 7. ORAL INTAKE: If vomiting, "Have you been able to drink liquids?" "How much fluids have you had in the past 24 hours?"     Yes 8. HYDRATION: "Any signs of dehydration?" (e.g., dry mouth [not just dry lips], too weak to stand, dizziness, new weight loss) "When did you last urinate?"     No signs of dehtdration 9. EXPOSURE: "Have you traveled to a foreign country recently?" "Have you  been exposed to anyone with diarrhea?" "Could you have eaten any food that was spoiled?"     No 10. ANTIBIOTIC USE: "Are you taking antibiotics now or have you taken antibiotics in the past 2 months?"       No 11. OTHER SYMPTOMS: "Do you have any other symptoms?" (e.g., fever, blood in stool)       Mucus 12. PREGNANCY: "Is there any chance you are pregnant?" "When was your last menstrual period?"       No  Protocols used: DIARRHEA-A-AH

## 2018-04-08 NOTE — Patient Instructions (Signed)
Start cipro 500mg  twice daily for 10 days. Start metronidazole 500mg  3x daily for 10 days.  Go to the ER if worsening abdominal pain or fever >101.

## 2018-04-08 NOTE — Telephone Encounter (Signed)
Pt. Reports she has had diarrhea x 1 week with "intense abdominal cramping and pain in my low abdomen. I'm concerned about possible blockage." No vomiting - slight nausea. Has mucus in stools. No blood or fever. Reports she is well hydrated. States she has a stool "every time I eat something." Appointment made.

## 2018-04-08 NOTE — Telephone Encounter (Signed)
Noted  

## 2018-04-08 NOTE — Progress Notes (Signed)
Subjective:    Patient ID: Alisha Reed, female    DOB: 08/13/1960, 57 y.o.   MRN: [id number]  HPI  [name] is a 57 yr old female who presents today with chief complaint of diarrhea. Reports tht her symptoms started last Saturday with diarrhea.  Reports that any time she eats she would have sharp pain in her left side and then would have to have a loose bowel movement Did take imodium on Sunday and the loose diarrhea stopped.  Only has pain "right after I eat."  She reports + nausea.  She denies recent antibiotic use.  No known sick contacts. Denies fever or blood in stool.     Review of Systems See HPI  Past Medical History:  Diagnosis Date  . Hypercholesterolemia   . Thyroid disease      Social History   Socioeconomic History  . Marital status: Married    Spouse name: Not on file  . Number of children: 2  . Years of education: 34  . Highest education level: Not on file  Occupational History  . Occupation: Therapist, sports  Social Needs  . Financial resource strain: Not on file  . Food insecurity:    Worry: Not on file    Inability: Not on file  . Transportation needs:    Medical: Not on file    Non-medical: Not on file  Tobacco Use  . Smoking status: Never Smoker  . Smokeless tobacco: Never Used  Substance and Sexual Activity  . Alcohol use: Yes    Comment: Scoial  . Drug use: No  . Sexual activity: Yes    Birth control/protection: Surgical  Lifestyle  . Physical activity:    Days per week: Not on file    Minutes per session: Not on file  . Stress: Not on file  Relationships  . Social connections:    Talks on phone: Not on file    Gets together: Not on file    Attends religious service: Not on file    Active member of club or organization: Not on file    Attends meetings of clubs or organizations: Not on file    Relationship status: Not on file  . Intimate partner violence:    Fear of current or ex partner: Not on file    Emotionally abused: Not on file   Physically abused: Not on file    Forced sexual activity: Not on file  Other Topics Concern  . Not on file  Social History Narrative   Fun/Hobby - Concerts, camping, grandchildren    Past Surgical History:  Procedure Laterality Date  . cholecystectomy    . FOOT SURGERY Bilateral   . HYSTERECTOMY ABDOMINAL WITH SALPINGECTOMY    . NASAL SINUS SURGERY      Family History  Problem Relation Age of Onset  . Scleroderma Mother   . Rheum arthritis Mother   . Heart disease Mother   . Heart disease Father   . Glaucoma Father   . Stomach cancer Maternal Grandmother   . Heart attack Maternal Grandfather   . Alzheimer's disease Paternal Grandmother   . Throat cancer Paternal Grandfather   . Colon cancer Neg Hx   . Liver cancer Neg Hx     No Known Allergies  Current Outpatient Medications on File Prior to Visit  Medication Sig Dispense Refill  . celecoxib (CELEBREX) 200 MG capsule TAKE 1 CAPSULE BY MOUTH TWICE A DAY 30 capsule 3  . glycopyrrolate (ROBINUL) 2 MG tablet  Take 1 tablet (2 mg total) by mouth daily. Take every morning. 30 tablet 6  . levothyroxine (SYNTHROID, LEVOTHROID) 75 MCG tablet Take 1 tablet (75 mcg total) by mouth daily before breakfast. 90 tablet 1  . methocarbamol (ROBAXIN) 500 MG tablet Take 1 tablet (500 mg total) by mouth 2 (two) times daily as needed for muscle spasms. 30 tablet 0  . ondansetron (ZOFRAN ODT) 4 MG disintegrating tablet Take 1 tablet (4 mg total) by mouth every 8 (eight) hours as needed for nausea or vomiting. 20 tablet 0  . predniSONE (STERAPRED UNI-PAK 21 TAB) 10 MG (21) TBPK tablet Take as directed 21 tablet 0  . traMADol (ULTRAM) 50 MG tablet Take 1-2 tablets (50-100 mg total) 3 (three) times daily as needed by mouth. 30 tablet 2   No current facility-administered medications on file prior to visit.     BP 124/70   Pulse 80   Temp 98 F (36.7 C) (Oral)   Ht 5\' 3"  (1.6 m)   Wt 162 lb (73.5 kg)   SpO2 98%   BMI 28.70 kg/m         Objective:   Physical Exam  Constitutional: She appears well-developed and well-nourished.  Cardiovascular: Normal rate, regular rhythm and normal heart sounds.  No murmur heard. Pulmonary/Chest: Effort normal and breath sounds normal. No respiratory distress. She has no wheezes.  Abdominal: Soft. Normal appearance and bowel sounds are normal. There is no rigidity.  Mild LLQ tenderness to palpation without guarding.   Skin: Skin is warm and dry.  Psychiatric: She has a normal mood and affect. Her behavior is normal. Judgment and thought content normal.          Assessment & Plan:  Diverticulitis- symptoms and exam most consistent with mild diverticulitis. We discussed confirmation with CT scan.  She wishes to start empiric abx and consider CT if symptoms worsen or fail to improve. I think that this is reasonable since her symptoms are mild. We discussed importance of proceeding to the ED if symptoms worsen or if she develops fever over the weekend. Will rx with cipro/flagyl (advised pt to avoid ETOH on flagyl).  She is advised to follow up with PCP in 1 week.  Pt verbalizes understanding.

## 2018-04-08 NOTE — Progress Notes (Signed)
Thank you for the details you included in the comment boxes. Those details are very helpful in determining the best course of treatment for you and help [place] to provide the best care. I am concerned that this has been a week and that your pain is 8/10. See plan below. If you worsen in the next 24 hours, become lightheaded or faint, or run a high fever over 100.4, please go to the ED immediately. If you do not begin to improve within the next 24-48 hours, please be seen face-to-face as this could be more serious.   We are sorry that you are not feeling well.  Here is how we plan to help!  Based on what you have shared with me it looks like you have Acute Infectious Diarrhea.  Most cases of acute diarrhea are due to infections with virus and bacteria and are self-limited conditions lasting less than 14 days.  For your symptoms you may take Imodium 2 mg tablets that are over the counter at your local pharmacy. Take two tablet now and then one after each loose stool up to 6 a day.  Antibiotics are not needed for most people with diarrhea.  Optional: Zofran 4 mg 1 tablet every 8 hours as needed for nausea and vomiting  I have sent in Cipro 500 mg two tablets twice a day for five days   HOME CARE  We recommend changing your diet to help with your symptoms for the next few days.  Drink plenty of fluids that contain water salt and sugar. Sports drinks such as Gatorade may help.   You may try broths, soups, bananas, applesauce, soft breads, mashed potatoes or crackers.   You are considered infectious for as long as the diarrhea continues. Hand washing or use of alcohol based hand sanitizers is recommend.  It is best to stay out of work or school until your symptoms stop.   GET HELP RIGHT AWAY  If you have dark yellow colored urine or do not pass urine frequently you should drink more fluids.    If your symptoms worsen   If you feel like you are going to pass out (faint)  You have a new  problem  MAKE SURE YOU   Understand these instructions.  Will watch your condition.  Will get help right away if you are not doing well or get worse.  Your e-visit answers were reviewed by a board certified advanced clinical practitioner to complete your personal care plan.  Depending on the condition, your plan could have included both over the counter or prescription medications.  If there is a problem please reply  once you have received a response from your provider.  Your safety is important to [place].  If you have drug allergies check your prescription carefully.    You can use MyChart to ask questions about today's visit, request a non-urgent call back, or ask for a work or school excuse for 24 hours related to this e-Visit. If it has been greater than 24 hours you will need to follow up with your provider, or enter a new e-Visit to address those concerns.   You will get an e-mail in the next two days asking about your experience.  I hope that your e-visit has been valuable and will speed your recovery. Thank you for using e-visits.

## 2018-04-11 ENCOUNTER — Telehealth: Payer: Self-pay | Admitting: Family

## 2018-04-11 NOTE — Telephone Encounter (Signed)
Spoke with pt to follow up. She reports that her abdominal pain is resolved. Having some nausea and has vomited a few times. Thinks it may be due to metronidazole. Advised pt to try taking with food and if still having issues to let us know and we can consider changing her abx.

## 2018-04-13 ENCOUNTER — Encounter: Payer: Self-pay | Admitting: Nurse Practitioner

## 2018-04-13 ENCOUNTER — Ambulatory Visit: Payer: 59 | Admitting: Nurse Practitioner

## 2018-04-13 VITALS — BP 128/80 | HR 97 | Temp 98.2°F | Ht 63.0 in | Wt 162.0 lb

## 2018-04-13 DIAGNOSIS — K5792 Diverticulitis of intestine, part unspecified, without perforation or abscess without bleeding: Secondary | ICD-10-CM | POA: Diagnosis not present

## 2018-04-13 DIAGNOSIS — R059 Cough, unspecified: Secondary | ICD-10-CM

## 2018-04-13 DIAGNOSIS — R05 Cough: Secondary | ICD-10-CM

## 2018-04-13 MED ORDER — BENZONATATE 100 MG PO CAPS: 100.0000 mg | ORAL_CAPSULE | Freq: Two times a day (BID) | ORAL | 0 refills | Status: DC | PRN

## 2018-04-13 MED FILL — BENZONATATE 100 MG CAPS: 100 | 10 days supply | Qty: 20 | Fill #0

## 2018-04-13 NOTE — Progress Notes (Signed)
Alisha Reed is a 57 y.o. female with the following history as recorded in [facility]:  Patient Active Problem List   Diagnosis Date Noted  . Acute pain of right shoulder 03/18/2018  . Hyperlipidemia 11/04/2017  . Vitamin D deficiency 11/04/2017  . S/P cholecystectomy 08/02/2017  . Skier's thumb, right, subsequent encounter 03/15/2017  . Chronic left shoulder pain 02/08/2017  . Nondisplaced fracture of greater tuberosity of left humerus, initial encounter for closed fracture 01/18/2017  . Encounter for general adult medical examination with abnormal findings 09/24/2016  . Hypothyroidism 09/24/2016    Current Outpatient Medications  Medication Sig Dispense Refill  . celecoxib (CELEBREX) 200 MG capsule TAKE 1 CAPSULE BY MOUTH TWICE A DAY 30 capsule 3  . ciprofloxacin (CIPRO) 500 MG tablet Take 1 tablet (500 mg total) by mouth 2 (two) times daily for 10 days. 20 tablet 0  . glycopyrrolate (ROBINUL) 2 MG tablet Take 1 tablet (2 mg total) by mouth daily. Take every morning. 30 tablet 6  . levothyroxine (SYNTHROID, LEVOTHROID) 75 MCG tablet Take 1 tablet (75 mcg total) by mouth daily before breakfast. 90 tablet 1  . methocarbamol (ROBAXIN) 500 MG tablet Take 1 tablet (500 mg total) by mouth 2 (two) times daily as needed for muscle spasms. 30 tablet 0  . ondansetron (ZOFRAN ODT) 4 MG disintegrating tablet Take 1 tablet (4 mg total) by mouth every 8 (eight) hours as needed for nausea or vomiting. 20 tablet 0  . predniSONE (STERAPRED UNI-PAK 21 TAB) 10 MG (21) TBPK tablet Take as directed 21 tablet 0  . traMADol (ULTRAM) 50 MG tablet Take 1-2 tablets (50-100 mg total) 3 (three) times daily as needed by mouth. 30 tablet 2  . benzonatate (TESSALON) 100 MG capsule Take 1 capsule (100 mg total) by mouth 2 (two) times daily as needed for cough. 20 capsule 0  . metroNIDAZOLE (FLAGYL) 500 MG tablet Take 1 tablet (500 mg total) by mouth 3 (three) times daily for 10 days. (Patient not taking: Reported on  04/13/2018) 30 tablet 0   No current facility-administered medications for this visit.     Allergies: Patient has no known allergies.  Past Medical History:  Diagnosis Date  . Hypercholesterolemia   . Thyroid disease     Past Surgical History:  Procedure Laterality Date  . cholecystectomy    . FOOT SURGERY Bilateral   . HYSTERECTOMY ABDOMINAL WITH SALPINGECTOMY    . NASAL SINUS SURGERY      Family History  Problem Relation Age of Onset  . Scleroderma Mother   . Rheum arthritis Mother   . Heart disease Mother   . Heart disease Father   . Glaucoma Father   . Stomach cancer Maternal Grandmother   . Heart attack Maternal Grandfather   . Alzheimer's disease Paternal Grandmother   . Throat cancer Paternal Grandfather   . Colon cancer Neg Hx   . Liver cancer Neg Hx     Social History   Tobacco Use  . Smoking status: Never Smoker  . Smokeless tobacco: Never Used  Substance Use Topics  . Alcohol use: Yes    Comment: Scoial     Subjective:  Ms Prashad is here today for evaluation of cough/cold symptoms, first began this past weekend on Sunday with chills, malaise, nasal congestion, cough, cough is now productive with yellow sputum. She was seen last week by another provider for possible diverticulitis flare- started on cipro, ,flagyl, stopped flagyl after several days because it made her vomit every  time she took it, but has continued cipro daily as prescribed and has had complete resolution of abdominal symptoms- no more pain or diarrhea, and had a normal bowel movement yesterday. Denies fevers, syncope, sore throat, chest pain, abdominal pain. She is not  alcohol drinker or cigarette smoker Has tried delsym for cough with no relief,cough is very bothersome to her  ROS- see HPI  Objective:  Vitals:   04/13/18 0856 04/13/18 0922  BP: 128/80   Pulse: (!) 105 97  Temp: 98.2 F (36.8 C)   TempSrc: Oral   SpO2: 97%   Weight: 162 lb (73.5 kg)   Height: 5\' 3"  (1.6 m)      General: Well developed, well nourished, in no acute distress  Skin : Warm and dry.  Head: Normocephalic and atraumatic  Eyes: Sclera and conjunctiva clear; pupils round and reactive to light; extraocular movements intact  Ears: External normal; canals clear; tympanic membranes normal  Oropharynx: Pink, supple. No suspicious lesions  Neck: Supple without thyromegaly, adenopathy  Lungs: Respirations unlabored; without wheezes, rales, rhonchi CVS exam: normal rate, regular rhythm, normal S1, S2, no murmurs, rubs, clicks or gallops.  Abdomen: Soft; nontender; nondistended; no masses or hepatosplenomegaly  Extremities: No edema, cyanosis Vessels: Symmetric bilaterally  Neurologic: Alert and oriented; speech intact; face symmetrical; moves all extremities well; CNII-XII intact without focal deficit  Psychiatric: Normal mood and affect.   Assessment:  1. Diverticulitis   2. Cough     Plan:   1. Diverticulitis Symptoms have resolved She will complete cipro course as prescribed instructed to f/u for new, worsening, recurrent symptoms  2. Cough She is currently on abx course, will continue Cough duration about 3-4 days at this time, I do not think further workup is needed at this point but will consider additional abx/chest xray if cough persists Home management, red flags and return precautions including when to seek immediate care discussed and printed on AVS We discussed options for cough- she would like to try tessalon - benzonatate (TESSALON) 100 MG capsule; Take 1 capsule (100 mg total) by mouth 2 (two) times daily as needed for cough.  Dispense: 20 capsule; Refill: 0   No follow-ups on file.  No orders of the defined types were placed in this encounter.   Requested Prescriptions   Signed Prescriptions Disp Refills  . benzonatate (TESSALON) 100 MG capsule 20 capsule 0    Sig: Take 1 capsule (100 mg total) by mouth 2 (two) times daily as needed for cough.

## 2018-04-13 NOTE — Patient Instructions (Signed)
I have sent tessalon prescription for cough  Please follow up for fevers over 101, if your symptoms get worse, or if your symptoms are not better after antibiotic.   Cough, Adult A cough helps to clear your throat and lungs. A cough may last only 2-3 weeks (acute), or it may last longer than 8 weeks (chronic). Many different things can cause a cough. A cough may be a sign of an illness or another medical condition. Follow these instructions at home:  Pay attention to any changes in your cough.  Take medicines only as told by your doctor. ? If you were prescribed an antibiotic medicine, take it as told by your doctor. Do not stop taking it even if you start to feel better. ? Talk with your doctor before you try using a cough medicine.  Drink enough fluid to keep your pee (urine) clear or pale yellow.  If the air is dry, use a cold steam vaporizer or humidifier in your home.  Stay away from things that make you cough at work or at home.  If your cough is worse at night, try using extra pillows to raise your head up higher while you sleep.  Do not smoke, and try not to be around smoke. If you need help quitting, ask your doctor.  Do not have caffeine.  Do not drink alcohol.  Rest as needed. Contact a doctor if:  You have new problems (symptoms).  You cough up yellow fluid (pus).  Your cough does not get better after 2-3 weeks, or your cough gets worse.  Medicine does not help your cough and you are not sleeping well.  You have pain that gets worse or pain that is not helped with medicine.  You have a fever.  You are losing weight and you do not know why.  You have night sweats. Get help right away if:  You cough up blood.  You have trouble breathing.  Your heartbeat is very fast. This information is not intended to replace advice given to you by your health care provider. Make sure you discuss any questions you have with your health care provider. Document  Released: 01/08/2011 Document Revised: 10/03/2015 Document Reviewed: 07/04/2014 Elsevier Interactive Patient Education  [employer].

## 2018-04-26 MED FILL — CELECOXIB 200 MG CAP: 200 | 30 days supply | Qty: 60 | Fill #1

## 2018-05-02 ENCOUNTER — Other Ambulatory Visit (INDEPENDENT_AMBULATORY_CARE_PROVIDER_SITE_OTHER): Payer: 59

## 2018-05-02 ENCOUNTER — Ambulatory Visit: Payer: 59 | Admitting: Nurse Practitioner

## 2018-05-02 ENCOUNTER — Other Ambulatory Visit: Payer: Self-pay | Admitting: Nurse Practitioner

## 2018-05-02 ENCOUNTER — Encounter: Payer: Self-pay | Admitting: Nurse Practitioner

## 2018-05-02 VITALS — BP 136/76 | HR 74 | Temp 98.2°F | Wt 162.0 lb

## 2018-05-02 DIAGNOSIS — R194 Change in bowel habit: Secondary | ICD-10-CM | POA: Diagnosis not present

## 2018-05-02 DIAGNOSIS — R109 Unspecified abdominal pain: Secondary | ICD-10-CM

## 2018-05-02 DIAGNOSIS — R7303 Prediabetes: Secondary | ICD-10-CM

## 2018-05-02 DIAGNOSIS — R1032 Left lower quadrant pain: Secondary | ICD-10-CM | POA: Diagnosis not present

## 2018-05-02 DIAGNOSIS — E039 Hypothyroidism, unspecified: Secondary | ICD-10-CM

## 2018-05-02 DIAGNOSIS — E785 Hyperlipidemia, unspecified: Secondary | ICD-10-CM | POA: Diagnosis not present

## 2018-05-02 LAB — COMPREHENSIVE METABOLIC PANEL
ALT: 9 U/L (ref 0–35)
AST: 13 U/L (ref 0–37)
Albumin: 4 g/dL (ref 3.5–5.2)
Alkaline Phosphatase: 59 U/L (ref 39–117)
BUN: 13 mg/dL (ref 6–23)
CO2: 27 mEq/L (ref 19–32)
Calcium: 9.4 mg/dL (ref 8.4–10.5)
Chloride: 104 mEq/L (ref 96–112)
Creatinine, Ser: 0.71 mg/dL (ref 0.40–1.20)
GFR: 89.54 mL/min (ref >60.00)
Glucose, Bld: 97 mg/dL (ref 70–99)
Potassium: 4.3 mEq/L (ref 3.5–5.1)
Sodium: 139 mEq/L (ref 135–145)
Total Bilirubin: 0.4 mg/dL (ref 0.2–1.2)
Total Protein: 6.9 g/dL (ref 6.0–8.3)

## 2018-05-02 LAB — CBC
HCT: 39.3 % (ref 36.0–46.0)
Hemoglobin: 13.3 g/dL (ref 12.0–15.0)
MCHC: 33.8 g/dL (ref 30.0–36.0)
MCV: 89.7 fl (ref 78.0–100.0)
Platelets: 367 10*3/uL (ref 150.0–400.0)
RBC: 4.39 Mil/uL (ref 3.87–5.11)
RDW: 14.1 % (ref 11.5–15.5)
WBC: 5.9 10*3/uL (ref 4.0–10.5)

## 2018-05-02 LAB — TSH: TSH: 0.38 u[IU]/mL (ref 0.35–4.50)

## 2018-05-02 LAB — LIPID PANEL
Cholesterol: 219 mg/dL — ABNORMAL HIGH (ref 0–200)
HDL: 52.3 mg/dL (ref >39.00)
LDL Cholesterol: 149 mg/dL — ABNORMAL HIGH (ref 0–99)
NonHDL: 166.58
Total CHOL/HDL Ratio: 4
Triglycerides: 90 mg/dL (ref 0.0–149.0)
VLDL: 18 mg/dL (ref 0.0–40.0)

## 2018-05-02 LAB — HEMOGLOBIN A1C: Hgb A1c MFr Bld: 6.3 % (ref 4.6–6.5)

## 2018-05-02 NOTE — Assessment & Plan Note (Signed)
Continue synthroid Update labs - TSH; Future

## 2018-05-02 NOTE — Patient Instructions (Addendum)
Head downstairs for labs today. I have placed a referral to GI for further evaluation of your stomach pain and bowel changes.  I will see you in 6 months!  Happy holidays!

## 2018-05-02 NOTE — Assessment & Plan Note (Signed)
Update labs - she is fasting Discussed the role of healthy diet and exercise in the management of HLD F/U with further recommendations pending lab results - Lipid panel; Future - CBC; Future - Comprehensive metabolic panel; Future

## 2018-05-02 NOTE — Progress Notes (Signed)
Alisha Reed is a 57 y.o. female with the following history as recorded in EpicCare:  Patient Active Problem List   Diagnosis Date Noted  . Acute pain of right shoulder 03/18/2018  . Hyperlipidemia 11/04/2017  . Vitamin D deficiency 11/04/2017  . S/P cholecystectomy 08/02/2017  . Skier's thumb, right, subsequent encounter 03/15/2017  . Chronic left shoulder pain 02/08/2017  . Nondisplaced fracture of greater tuberosity of left humerus, initial encounter for closed fracture 01/18/2017  . Encounter for general adult medical examination with abnormal findings 09/24/2016  . Hypothyroidism 09/24/2016    Current Outpatient Medications  Medication Sig Dispense Refill  . benzonatate (TESSALON) 100 MG capsule Take 1 capsule (100 mg total) by mouth 2 (two) times daily as needed for cough. 20 capsule 0  . celecoxib (CELEBREX) 200 MG capsule TAKE 1 CAPSULE BY MOUTH TWICE A DAY 30 capsule 3  . glycopyrrolate (ROBINUL) 2 MG tablet Take 1 tablet (2 mg total) by mouth daily. Take every morning. 30 tablet 6  . levothyroxine (SYNTHROID, LEVOTHROID) 75 MCG tablet Take 1 tablet (75 mcg total) by mouth daily before breakfast. 90 tablet 1  . methocarbamol (ROBAXIN) 500 MG tablet Take 1 tablet (500 mg total) by mouth 2 (two) times daily as needed for muscle spasms. 30 tablet 0  . ondansetron (ZOFRAN ODT) 4 MG disintegrating tablet Take 1 tablet (4 mg total) by mouth every 8 (eight) hours as needed for nausea or vomiting. 20 tablet 0  . predniSONE (STERAPRED UNI-PAK 21 TAB) 10 MG (21) TBPK tablet Take as directed 21 tablet 0  . traMADol (ULTRAM) 50 MG tablet Take 1-2 tablets (50-100 mg total) 3 (three) times daily as needed by mouth. 30 tablet 2   No current facility-administered medications for this visit.     Allergies: Patient has no known allergies.  Past Medical History:  Diagnosis Date  . Hypercholesterolemia   . Thyroid disease     Past Surgical History:  Procedure Laterality Date  .  cholecystectomy    . FOOT SURGERY Bilateral   . HYSTERECTOMY ABDOMINAL WITH SALPINGECTOMY    . NASAL SINUS SURGERY      Family History  Problem Relation Age of Onset  . Scleroderma Mother   . Rheum arthritis Mother   . Heart disease Mother   . Heart disease Father   . Glaucoma Father   . Stomach cancer Maternal Grandmother   . Heart attack Maternal Grandfather   . Alzheimer's disease Paternal Grandmother   . Throat cancer Paternal Grandfather   . Colon cancer Neg Hx   . Liver cancer Neg Hx     Social History   Tobacco Use  . Smoking status: Never Smoker  . Smokeless tobacco: Never Used  Substance Use Topics  . Alcohol use: Yes    Comment: Scoial     Subjective:  Ms Kube is here today f/u of HLD, prediabetes, noted at 10/2017 CPE, not currently maintained on cholesterol or diabetes agents. We will also routinely follow up on hypothyroidism. She tells me she did work on diet and exercise for a short time after her CPE, did join [employer], but more recently has just been too busy/stressed and not really paying attention to diet, exercise. She is also requesting evaluation of LLQ abdominal pain, which has been intermittent for her since she was treated with antibiotic course for diverticulitis flare back in November, initially she felt pain was gone after abx course, but actually tells me today shes noticed continued intermittent LLQ  abdominal pain, especially with bowel movements, and increased frequency of bowel movements, typically going about an hour after every meal now, no diarrhea, but hard small stools, says her bowels "just seem different." Denies fevers, heartburn, nausea, vomiting, rectal bleeding. Colonoscopy is up to date   Lab Results  Component Value Date   CHOL 231 (H) 11/04/2017   HDL 58.00 11/04/2017   LDLCALC 147 (H) 11/04/2017   TRIG 130.0 11/04/2017   CHOLHDL 4 11/04/2017    Lab Results  Component Value Date   HGBA1C 6.4 11/04/2017    Hypothryoidism-maintained on synthroid 75 mcg daily, reports daily medication compliance without noted adverse medication effects  Lab Results  Component Value Date   TSH 0.35 11/04/2017    ROS- See HPI  Objective:  Vitals:   05/02/18 0755  BP: 136/76  Pulse: 74  Temp: 98.2 F (36.8 C)  SpO2: 98%  Weight: 162 lb (73.5 kg)    General: Well developed, well nourished, in no acute distress  Skin : Warm and dry.  Head: Normocephalic and atraumatic  Eyes: Sclera and conjunctiva clear; pupils round and reactive to light; extraocular movements intact  Oropharynx: Pink, supple. No suspicious lesions  Neck: Supple Lungs: Respirations unlabored; clear to auscultation bilaterally without wheeze, rales, rhonchi  CVS exam: normal rate, regular rhythm, normal S1, S2, no murmurs, rubs, clicks or gallops.  Abdomen: Soft; nontender; nondistended; normoactive bowel sounds; no masses or hepatosplenomegaly  Extremities: No edema, cyanosis, clubbing  Vessels: Symmetric bilaterally  Neurologic: Alert and oriented; speech intact; face symmetrical; moves all extremities well; CNII-XII intact without focal deficit  Psychiatric: Normal mood and affect.   Assessment:  1. Hypothyroidism, unspecified type   2. Hyperlipidemia, unspecified hyperlipidemia type   3. Pre-diabetes   4. Abdominal pain, unspecified abdominal location   5. Left lower quadrant abdominal pain   6. Frequent bowel movements     Plan:   Abdominal pain, unspecified abdominal location PE, VSS today We discussed referral to GI for further evaluation of pain, change in bowel habits, she is agreeable Update routine labs today as well - CBC; Future - Comprehensive metabolic panel; Future - Ambulatory referral to Gastroenterology  Left lower quadrant abdominal pain PE, VSS today We discussed referral to GI for further evaluation of pain, change in bowel habits, she is agreeable Update routine labs today as well - CBC;  Future - Comprehensive metabolic panel; Future - Ambulatory referral to Gastroenterology  Frequent bowel movements PE, VSS today We discussed referral to GI for further evaluation of pain, change in bowel habits, she is agreeable - Hemoglobin A1c; Future - CBC; Future - Comprehensive metabolic panel; Future - Ambulatory referral to Gastroenterology   Return in about 6 months (around 11/01/2018) for CPE.  Orders Placed This Encounter  Procedures  . Hemoglobin A1c    Standing Status:   Future    Standing Expiration Date:   05/03/2019  . Lipid panel    Standing Status:   Future    Standing Expiration Date:   05/03/2019  . CBC    Standing Status:   Future    Standing Expiration Date:   05/03/2019  . Comprehensive metabolic panel    Standing Status:   Future    Standing Expiration Date:   05/03/2019  . TSH    Standing Status:   Future    Standing Expiration Date:   05/03/2019  . Ambulatory referral to Gastroenterology    Referral Priority:   Routine    Referral Type:  Consultation    Referral Reason:   Specialty Services Required    Number of Visits Requested:   1    Requested Prescriptions    No prescriptions requested or ordered in this encounter

## 2018-05-02 NOTE — Assessment & Plan Note (Signed)
Update labs   Discussed the role of healthy diet and exercise in the management of pre-diabetes F/U with further recommendations pending lab results - Hemoglobin A1c; Future - Lipid panel; Future - Comprehensive metabolic panel; Future

## 2018-05-06 ENCOUNTER — Ambulatory Visit (INDEPENDENT_AMBULATORY_CARE_PROVIDER_SITE_OTHER)
Admission: RE | Admit: 2018-05-06 | Discharge: 2018-05-06 | Disposition: A | Discharge status: Home / Self Care | Payer: 59 | Source: Ambulatory Visit | Attending: Nurse Practitioner | Admitting: Nurse Practitioner

## 2018-05-06 DIAGNOSIS — R109 Unspecified abdominal pain: Secondary | ICD-10-CM | POA: Diagnosis not present

## 2018-05-06 DIAGNOSIS — R1030 Lower abdominal pain, unspecified: Secondary | ICD-10-CM | POA: Diagnosis not present

## 2018-05-06 MED ORDER — IOPAMIDOL (ISOVUE-300) INJECTION 61%
100.0000 mL | Freq: Once | INTRAVENOUS | Status: AC | PRN
  Administered 2018-05-06: 100 mL via INTRAVENOUS

## 2018-05-25 ENCOUNTER — Other Ambulatory Visit: Payer: Self-pay | Admitting: Nurse Practitioner

## 2018-05-25 MED FILL — CELECOXIB 200 MG CAP: 200 | 30 days supply | Qty: 60 | Fill #2

## 2018-05-25 MED FILL — LEVOTHYROXINE 75 MCG TABLET: 75 | 90 days supply | Qty: 90 | Fill #0

## 2018-05-27 MED FILL — ESTRADIOL 0.5 MG TABLET: 0.5 | 90 days supply | Qty: 90 | Fill #1

## 2018-06-02 ENCOUNTER — Encounter (INDEPENDENT_AMBULATORY_CARE_PROVIDER_SITE_OTHER): Payer: Self-pay | Admitting: Orthopaedic Surgery

## 2018-06-02 NOTE — Telephone Encounter (Signed)
Can you set her up for another shoulder injection with either [facility] or [facility]

## 2018-06-16 ENCOUNTER — Ambulatory Visit (INDEPENDENT_AMBULATORY_CARE_PROVIDER_SITE_OTHER): Payer: 59 | Admitting: Family Medicine

## 2018-06-16 ENCOUNTER — Encounter (INDEPENDENT_AMBULATORY_CARE_PROVIDER_SITE_OTHER): Payer: Self-pay | Admitting: Family Medicine

## 2018-06-16 DIAGNOSIS — G8929 Other chronic pain: Secondary | ICD-10-CM

## 2018-06-16 DIAGNOSIS — M25512 Pain in left shoulder: Secondary | ICD-10-CM | POA: Diagnosis not present

## 2018-06-16 DIAGNOSIS — M7502 Adhesive capsulitis of left shoulder: Secondary | ICD-10-CM | POA: Diagnosis not present

## 2018-06-16 MED ORDER — LIDOCAINE HCL 1 % IJ SOLN
5.0000 mL | INTRAMUSCULAR | Status: AC | PRN
  Administered 2018-06-16: 5 mL

## 2018-06-16 MED ORDER — METHYLPREDNISOLONE ACETATE 40 MG/ML IJ SUSP
40.0000 mg | INTRAMUSCULAR | Status: AC | PRN
  Administered 2018-06-16: 40 mg via INTRA_ARTICULAR

## 2018-06-16 NOTE — Progress Notes (Signed)
Office Visit Note   Patient: Alisha Reed           Date of Birth: [birth year]           MRN: [id number] Visit Date: 06/16/2018 Requested by: Lance Sell, NP [place], [place] [id number] PCP: Lance Sell, NP  Subjective: Chief Complaint  Patient presents with  . Right Shoulder - Pain    Cortisone injection, US-guided per Dr. Erlinda Hong    HPI: She is here with recurrent left shoulder pain.  Several weeks ago she lifted something and felt a pop in her shoulder.  Since then she has developed pain and stiffness.  She did well after intra-articular injection last spring and would like to try one again.              ROS: Otherwise noncontributory  Objective: Vital Signs: There were no vitals taken for this visit.  Physical Exam:  Left shoulder: She has adhesive capsulitis with abduction just under 90 degrees, external rotation about 45 degrees and internal rotation about 60 degrees.  Isometric rotator cuff strength is 5/5 throughout with no significant pain.  Imaging: Musculoskeletal ultrasound: Biceps tendon is located in its groove and has normal appearance.  Subscapularis tendon is unremarkable.  Supraspinatus has a partial-thickness leading edge tear with no retraction and mild tendinopathy changes but overall looks intact.  Infraspinatus has a normal appearance.  Assessment & Plan: 1.  Recurrent left shoulder pain with adhesive capsulitis, possible underlying labrum tear. -We will try another intra-articular injection under ultrasound guidance.  If symptoms persist then possibly arthroscopic debridement of labrum tear per Dr. Erlinda Hong.   Follow-Up Instructions: No follow-ups on file.      Procedures: Large Joint Inj: L glenohumeral on 06/16/2018 9:46 AM Indications: pain Details: 22 G 3.5 in needle, ultrasound-guided posterior approach  Arthrogram: No  Medications: 5 mL lidocaine 1 %; 40 mg methylPREDNISolone acetate 40 MG/ML  Good relief and improved ROM  during anesthetic phase. Consent was given by the patient.      No notes on file    PMFS History: Patient Active Problem List   Diagnosis Date Noted  . Pre-diabetes 05/02/2018  . Acute pain of right shoulder 03/18/2018  . Hyperlipidemia 11/04/2017  . Vitamin D deficiency 11/04/2017  . S/P cholecystectomy 08/02/2017  . Skier's thumb, right, subsequent encounter 03/15/2017  . Chronic left shoulder pain 02/08/2017  . Nondisplaced fracture of greater tuberosity of left humerus, initial encounter for closed fracture 01/18/2017  . Encounter for general adult medical examination with abnormal findings 09/24/2016  . Hypothyroidism 09/24/2016   Past Medical History:  Diagnosis Date  . Hypercholesterolemia   . Thyroid disease     Family History  Problem Relation Age of Onset  . Scleroderma Mother   . Rheum arthritis Mother   . Heart disease Mother   . Heart disease Father   . Glaucoma Father   . Stomach cancer Maternal Grandmother   . Heart attack Maternal Grandfather   . Alzheimer's disease Paternal Grandmother   . Throat cancer Paternal Grandfather   . Colon cancer Neg Hx   . Liver cancer Neg Hx     Past Surgical History:  Procedure Laterality Date  . cholecystectomy    . FOOT SURGERY Bilateral   . HYSTERECTOMY ABDOMINAL WITH SALPINGECTOMY    . NASAL SINUS SURGERY     Social History   Occupational History  . Occupation: Therapist, sports  Tobacco Use  . Smoking status: Never Smoker  .  Smokeless tobacco: Never Used  Substance and Sexual Activity  . Alcohol use: Yes    Comment: Scoial  . Drug use: No  . Sexual activity: Yes    Birth control/protection: Surgical

## 2018-06-27 MED FILL — CELECOXIB 200 MG CAP: 200 | 30 days supply | Qty: 60 | Fill #3

## 2018-07-07 ENCOUNTER — Encounter: Payer: Self-pay | Admitting: Nurse Practitioner

## 2018-07-11 ENCOUNTER — Telehealth: Payer: 59 | Admitting: Family

## 2018-07-11 DIAGNOSIS — R059 Cough, unspecified: Secondary | ICD-10-CM

## 2018-07-11 DIAGNOSIS — R05 Cough: Secondary | ICD-10-CM

## 2018-07-11 MED ORDER — BENZONATATE 100 MG PO CAPS: 100.0000 mg | ORAL_CAPSULE | Freq: Two times a day (BID) | ORAL | 0 refills | Status: DC | PRN

## 2018-07-11 MED ORDER — PREDNISONE 10 MG (21) PO TBPK: ORAL_TABLET | ORAL | 0 refills | Status: DC

## 2018-07-11 NOTE — Progress Notes (Signed)

## 2018-07-15 ENCOUNTER — Telehealth: Payer: 59 | Admitting: Nurse Practitioner

## 2018-07-15 DIAGNOSIS — J069 Acute upper respiratory infection, unspecified: Secondary | ICD-10-CM | POA: Diagnosis not present

## 2018-07-15 MED ORDER — AZITHROMYCIN 250 MG PO TABS: ORAL_TABLET | ORAL | 0 refills | Status: DC

## 2018-07-15 NOTE — Progress Notes (Signed)
We are sorry that you are not feeling well.  Here is how we plan to help!  Based on your presentation I believe you most likely have A cough due to bacteria.  When patients have a fever and a productive cough with a change in color or increased sputum production, we are concerned about bacterial bronchitis.  If left untreated it can progress to pneumonia.  If your symptoms do not improve with your treatment plan it is important that you contact your provider.   I have prescribed Azithromyin 250 mg: two tablets now and then one tablet daily for 4 additonal days    In addition you may use tessalon perles as rx    From your responses in the eVisit questionnaire you describe inflammation in the upper respiratory tract which is causing a significant cough.  This is commonly called Bronchitis and has four common causes:    Allergies  Viral Infections  Acid Reflux  Bacterial Infection Allergies, viruses and acid reflux are treated by controlling symptoms or eliminating the cause. An example might be a cough caused by taking certain blood pressure medications. You stop the cough by changing the medication. Another example might be a cough caused by acid reflux. Controlling the reflux helps control the cough.  USE OF BRONCHODILATOR ("RESCUE") INHALERS: There is a risk from using your bronchodilator too frequently.  The risk is that over-reliance on a medication which only relaxes the muscles surrounding the breathing tubes can reduce the effectiveness of medications prescribed to reduce swelling and congestion of the tubes themselves.  Although you feel brief relief from the bronchodilator inhaler, your asthma may actually be worsening with the tubes becoming more swollen and filled with mucus.  This can delay other crucial treatments, such as oral steroid medications. If you need to use a bronchodilator inhaler daily, several times per day, you should discuss this with your provider.  There are probably  better treatments that could be used to keep your asthma under control.     HOME CARE . Only take medications as instructed by your medical team. . Complete the entire course of an antibiotic. . Drink plenty of fluids and get plenty of rest. . Avoid close contacts especially the very young and the elderly . Cover your mouth if you cough or cough into your sleeve. . Always remember to wash your hands . A steam or ultrasonic humidifier can help congestion.   GET HELP RIGHT AWAY IF: . You develop worsening fever. . You become short of breath . You cough up blood. . Your symptoms persist after you have completed your treatment plan MAKE SURE YOU   Understand these instructions.  Will watch your condition.  Will get help right away if you are not doing well or get worse.  Your e-visit answers were reviewed by a board certified advanced clinical practitioner to complete your personal care plan.  Depending on the condition, your plan could have included both over the counter or prescription medications. If there is a problem please reply  once you have received a response from your provider. Your safety is important to [place].  If you have drug allergies check your prescription carefully.    You can use MyChart to ask questions about today's visit, request a non-urgent call back, or ask for a work or school excuse for 24 hours related to this e-Visit. If it has been greater than 24 hours you will need to follow up with your provider, or enter a  new e-Visit to address those concerns. You will get an e-mail in the next two days asking about your experience.  I hope that your e-visit has been valuable and will speed your recovery. Thank you for using e-visits.  5 minutes spent reviewing and documenting in chart.

## 2018-07-21 ENCOUNTER — Encounter: Payer: Self-pay | Admitting: Nurse Practitioner

## 2018-07-21 ENCOUNTER — Ambulatory Visit (INDEPENDENT_AMBULATORY_CARE_PROVIDER_SITE_OTHER): Payer: 59 | Admitting: Nurse Practitioner

## 2018-07-21 ENCOUNTER — Ambulatory Visit (INDEPENDENT_AMBULATORY_CARE_PROVIDER_SITE_OTHER)
Admission: RE | Admit: 2018-07-21 | Discharge: 2018-07-21 | Disposition: A | Discharge status: Home / Self Care | Payer: 59 | Source: Ambulatory Visit | Attending: Nurse Practitioner | Admitting: Nurse Practitioner

## 2018-07-21 ENCOUNTER — Other Ambulatory Visit: Payer: Self-pay

## 2018-07-21 VITALS — BP 136/88 | HR 99 | Wt 159.0 lb

## 2018-07-21 DIAGNOSIS — R059 Cough, unspecified: Secondary | ICD-10-CM

## 2018-07-21 DIAGNOSIS — R0602 Shortness of breath: Secondary | ICD-10-CM

## 2018-07-21 DIAGNOSIS — R05 Cough: Secondary | ICD-10-CM

## 2018-07-21 MED ORDER — IPRATROPIUM-ALBUTEROL 0.5-2.5 (3) MG/3ML IN SOLN
3.0000 mL | Freq: Once | RESPIRATORY_TRACT | Status: AC
  Administered 2018-07-21: 3 mL via RESPIRATORY_TRACT

## 2018-07-21 MED ORDER — ALBUTEROL SULFATE 108 (90 BASE) MCG/ACT IN AEPB: 1.0000 | INHALATION_SPRAY | Freq: Four times a day (QID) | RESPIRATORY_TRACT | 1 refills | Status: DC | PRN

## 2018-07-21 NOTE — Progress Notes (Signed)
Alisha Reed is a 58 y.o. female with the following history as recorded in EpicCare:  Patient Active Problem List   Diagnosis Date Noted  . Pre-diabetes 05/02/2018  . Acute pain of right shoulder 03/18/2018  . Hyperlipidemia 11/04/2017  . Vitamin D deficiency 11/04/2017  . S/P cholecystectomy 08/02/2017  . Skier's thumb, right, subsequent encounter 03/15/2017  . Chronic left shoulder pain 02/08/2017  . Nondisplaced fracture of greater tuberosity of left humerus, initial encounter for closed fracture 01/18/2017  . Encounter for general adult medical examination with abnormal findings 09/24/2016  . Hypothyroidism 09/24/2016    Current Outpatient Medications  Medication Sig Dispense Refill  . celecoxib (CELEBREX) 200 MG capsule TAKE 1 CAPSULE BY MOUTH TWICE A DAY 30 capsule 3  . estradiol (ESTRACE) 0.5 MG tablet     . levothyroxine (SYNTHROID, LEVOTHROID) 75 MCG tablet TAKE 1 TABLET (75 MCG TOTAL) BY MOUTH DAILY BEFORE BREAKFAST. 90 tablet 1  . methocarbamol (ROBAXIN) 500 MG tablet Take 1 tablet (500 mg total) by mouth 2 (two) times daily as needed for muscle spasms. 30 tablet 0  . predniSONE (STERAPRED UNI-PAK 21 TAB) 10 MG (21) TBPK tablet As directed 21 tablet 0  . Albuterol Sulfate (PROAIR RESPICLICK) 161 (90 Base) MCG/ACT AEPB Inhale 1-2 puffs into the lungs every 6 (six) hours as needed. 1 each 1  . benzonatate (TESSALON) 100 MG capsule Take 1 capsule (100 mg total) by mouth 2 (two) times daily as needed for cough. (Patient not taking: Reported on 07/21/2018) 20 capsule 0   No current facility-administered medications for this visit.     Allergies: Patient has no known allergies.  Past Medical History:  Diagnosis Date  . Hypercholesterolemia   . Thyroid disease     Past Surgical History:  Procedure Laterality Date  . cholecystectomy    . FOOT SURGERY Bilateral   . HYSTERECTOMY ABDOMINAL WITH SALPINGECTOMY    . NASAL SINUS SURGERY      Family History  Problem Relation Age  of Onset  . Scleroderma Mother   . Rheum arthritis Mother   . Heart disease Mother   . Heart disease Father   . Glaucoma Father   . Stomach cancer Maternal Grandmother   . Heart attack Maternal Grandfather   . Alzheimer's disease Paternal Grandmother   . Throat cancer Paternal Grandfather   . Colon cancer Neg Hx   . Liver cancer Neg Hx     Social History   Tobacco Use  . Smoking status: Never Smoker  . Smokeless tobacco: Never Used  Substance Use Topics  . Alcohol use: Yes    Comment: Scoial     Subjective:  Alisha Reed is here today requesting evaluation of cough, sob, which have been ongoing for the past 2 weeks Was treated with z-pak and prednisone course for URI last week by e-visit, she completed the steroid and abx but continues to have nagging cough and feeling sob. She was initially having other symptoms- chills, body aches, nausea, but these symptoms have resolved. Cough was initially productive, now dry She has been missing work due to cough. Recent travel: no Sick contacts: husband had flu A and was treated for it,he Is better now No fevers, syncope, confusion, headaches, cp, abdominal pain, vomiting Tried at home: tessalon, mucinex  ROS- See HPI  Objective:  Vitals:   07/21/18 1026  BP: 136/88  Pulse: 99  SpO2: 96%  Weight: 159 lb (72.1 kg)    General: Well developed, well nourished, in no  acute distress  Skin : Warm and dry.  Head: Normocephalic and atraumatic  Eyes: Sclera and conjunctiva clear; pupils round and reactive to light; extraocular movements intact  Oropharynx: Pink, supple. No suspicious lesions  Neck: Supple without thyromegaly, adenopathy  Lungs: Respirations unlabored; lungs are diminished in lower bases; coarse cough CVS exam: normal rate and regular rhythm, S1 and S2 normal.  Extremities: No edema, cyanosis, clubbing  Vessels: Symmetric bilaterally  Neurologic: Alert and oriented; speech intact; face symmetrical; moves all extremities  well; CNII-XII intact without focal deficit  Psychiatric: Normal mood and affect.  Assessment:  1. Cough   2. SOB (shortness of breath)     Plan:   Duoneb in office today  Chest xray ordered Albuterol Rx sent- med dosing and side effects discussed- she will use albuterol inhaler 1-2 puffs every 4-6 hours x 2 days then reduce to PRN Could be postviral/post RTI cough which we discussed could persist for 6-8 weeks Home management, red flags and return precautions including when to seek immediate care discussed and printed on AVS F/U with further orders pending CXR  No follow-ups on file.  Orders Placed This Encounter  Procedures  . DG Chest 2 View    Standing Status:   Future    Number of Occurrences:   1    Standing Expiration Date:   08/21/2018    Order Specific Question:   Reason for Exam (SYMPTOM  OR DIAGNOSIS REQUIRED)    Answer:   cough, SOB    Order Specific Question:   Is patient pregnant?    Answer:   No    Order Specific Question:   Preferred imaging location?    Answer:   [facility]    Order Specific Question:   Radiology Contrast Protocol - do NOT remove file path    Answer:   [email or web address]    Requested Prescriptions   Signed Prescriptions Disp Refills  . Albuterol Sulfate (PROAIR RESPICLICK) 119 (90 Base) MCG/ACT AEPB 1 each 1    Sig: Inhale 1-2 puffs into the lungs every 6 (six) hours as needed.

## 2018-07-21 NOTE — Patient Instructions (Signed)
Discharge instructions were given verbally as epic was down

## 2018-07-25 ENCOUNTER — Other Ambulatory Visit (INDEPENDENT_AMBULATORY_CARE_PROVIDER_SITE_OTHER): Payer: Self-pay | Admitting: Physician Assistant

## 2018-07-25 MED FILL — CELECOXIB 200 MG CAP: 200 | 30 days supply | Qty: 60 | Fill #0 | Status: TO

## 2018-07-28 DIAGNOSIS — L821 Other seborrheic keratosis: Secondary | ICD-10-CM | POA: Diagnosis not present

## 2018-07-28 DIAGNOSIS — L570 Actinic keratosis: Secondary | ICD-10-CM | POA: Diagnosis not present

## 2018-08-04 ENCOUNTER — Encounter: Payer: Self-pay | Admitting: Nurse Practitioner

## 2018-08-05 MED FILL — ESTRADIOL 0.5 MG TABS: 0.5 | 90 days supply | Qty: 90 | Fill #0

## 2018-08-05 MED FILL — CELECOXIB 200 MG CAPSULE: 200 | 90 days supply | Qty: 180 | Fill #0

## 2018-08-05 MED FILL — LEVOTHYROXINE 75 MCG TABLET: 75 | 90 days supply | Qty: 90 | Fill #0

## 2018-08-08 MED FILL — PROAIR RESPICLICK INHAL PWD: 108 (90 BAS | 30 days supply | Qty: 1 | Fill #0

## 2018-08-11 ENCOUNTER — Ambulatory Visit: Payer: 59 | Admitting: Gastroenterology

## 2018-08-11 ENCOUNTER — Ambulatory Visit: Payer: Self-pay | Admitting: *Deleted

## 2018-08-11 NOTE — Telephone Encounter (Signed)
Pt informed we are not ordering test for COVID-19, the doctor she works closely with will get his results some time tomorrow, she was told to isolate herself and to follow up  With [place] [facility] for further instructions, she is not scheduled to work again until next Wednesday.

## 2018-08-11 NOTE — Telephone Encounter (Signed)
Pt reports close contact with a co-worker who has been symptomatic for covid-19 and has been tested today. States 3 other coworkers tested. 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  She is requesting testing/order for testing as her husband is immunosuppressed, multiple myeloma, on maintaince chemo. Pt states she was in direct contact with co- worker yesterday. Pt has no symptoms. Reviewed precautions, symptoms. States she is going to self isolate at location not in home. States she was informed co-workers test results would be back tomorrow.  She  is pt of Brien Few, has appt with C. Nche 11/09/2018  Please advise: [phone]  Reason for Disposition . COVID-19 Testing, questions about  Answer Assessment - Initial Assessment Questions 1. COVID-19 DIAGNOSIS: "Who made your Coronavirus (COVID-19) diagnosis?" "Was it confirmed by a positive lab test?" If not diagnosed by a HCP, ask "Are there lots of cases (community spread) where you live?" (See public health department website, if unsure)   * MAJOR community spread: high number of cases; numbers of cases are increasing; many people hospitalized.   * MINOR community spread: low number of cases; not increasing; few or no people hospitalized     N/A, testing of coworkers pending 2. ONSET: "When did the COVID-19 symptoms start?"      No symptoms 3. WORST SYMPTOM: "What is your worst symptom?" (e.g., cough, fever, shortness of breath, muscle aches)     *No Answer* 4. COUGH: "How bad is the cough?"       *No Answer* 5. FEVER: "Do you have a fever?" If so, ask: "What is your temperature, how was it measured, and when did it start?"     *No Answer* 6. RESPIRATORY STATUS: "Describe your breathing?" (e.g., shortness of breath, wheezing, unable to speak)      *No Answer* 7. BETTER-SAME-WORSE: "Are you getting better, staying the same or getting worse compared to yesterday?"  If getting worse, ask,  "In what way?"     *No Answer* 8. HIGH RISK DISEASE: "Do you have any chronic medical problems?" (e.g., asthma, heart or lung disease, weak immune system, etc.)     *No Answer* 9. PREGNANCY: "Is there any chance you are pregnant?" "When was your last menstrual period?"     *No Answer* 10. OTHER SYMPTOMS: "Do you have any other symptoms?"  (e.g., runny nose, headache, sore throat, loss of smell)       None.  Requesting advise on testing as husband is immunosuppressed.  Protocols used: CORONAVIRUS (COVID-19) DIAGNOSED OR SUSPECTED-A-AH

## 2018-09-17 NOTE — Progress Notes (Signed)
Greater than 5 minutes, yet less than 10 minutes of time have been spent researching, coordinating, and implementing care for this patient today.  Thank you for the details you included in the comment boxes. Those details are very helpful in determining the best course of treatment for you and help us to provide the best care.  

## 2018-09-19 ENCOUNTER — Telehealth: Payer: Self-pay | Admitting: Internal Medicine

## 2018-09-19 NOTE — Telephone Encounter (Signed)
LVM for patient to call back to make appt with Dr. [name], and cancel appt with [facility].

## 2018-09-19 NOTE — Telephone Encounter (Signed)
-----   Message from Biagio Borg, MD sent at 09/10/2018  6:34 PM EDT ----- Regarding: transfer of care Alisha Reed or other [facility],  Pt is due for CPX and f/u PreDM  Please contact this patient, and ask them to schedule with me as new PCP asap (even same day if possible) when you are able to address this, in order for the patient to continue have updated ongoing care without being lost in the system, and to keep up a high standard of care at [facility].  This visit would be for "office visit" to f/u general medical conditions (such as Diabetics who are due for f/u A1c or other chronic medical problem) or could be CPX if the patient does not necessarily need specific chronic condition follow up and wants this.     These visits can be In Person (if no fever symptoms), or Virtual.  I would prefer in person during day hours at the office, but either is ok.  Exceptions can be made to scheduling of currently ill patients in person who happen to have a current issue such as feeling feverish and UTI symptoms or other infection (just not anything related to possible COVID19 symptoms)  Please be aware I would like 4 Doxy (virtual) visits if possible each Mon/Tues/Wed/Thur evenings that I can do from home.  The template has been changed to reflect the ability to do this, and I will just need to be aware to do this by watching my schedule.

## 2018-10-20 DIAGNOSIS — D369 Benign neoplasm, unspecified site: Secondary | ICD-10-CM | POA: Diagnosis not present

## 2018-10-20 DIAGNOSIS — H2513 Age-related nuclear cataract, bilateral: Secondary | ICD-10-CM | POA: Diagnosis not present

## 2018-10-20 DIAGNOSIS — H5203 Hypermetropia, bilateral: Secondary | ICD-10-CM | POA: Diagnosis not present

## 2018-10-24 ENCOUNTER — Other Ambulatory Visit (INDEPENDENT_AMBULATORY_CARE_PROVIDER_SITE_OTHER): Payer: Self-pay | Admitting: Physician Assistant

## 2018-10-24 MED FILL — ESTRADIOL 0.5 MG TABS: 0.5 | 90 days supply | Qty: 90 | Fill #1

## 2018-11-07 ENCOUNTER — Encounter: Payer: 59 | Admitting: Nurse Practitioner

## 2018-11-08 ENCOUNTER — Encounter: Payer: Self-pay | Admitting: Internal Medicine

## 2018-11-08 ENCOUNTER — Other Ambulatory Visit (INDEPENDENT_AMBULATORY_CARE_PROVIDER_SITE_OTHER): Payer: 59

## 2018-11-08 ENCOUNTER — Ambulatory Visit (INDEPENDENT_AMBULATORY_CARE_PROVIDER_SITE_OTHER): Payer: 59 | Admitting: Internal Medicine

## 2018-11-08 ENCOUNTER — Other Ambulatory Visit: Payer: Self-pay

## 2018-11-08 VITALS — BP 120/68 | HR 88 | Temp 98.2°F | Ht 63.0 in | Wt 165.0 lb

## 2018-11-08 DIAGNOSIS — E785 Hyperlipidemia, unspecified: Secondary | ICD-10-CM | POA: Diagnosis not present

## 2018-11-08 DIAGNOSIS — Z0000 Encounter for general adult medical examination without abnormal findings: Secondary | ICD-10-CM

## 2018-11-08 DIAGNOSIS — E538 Deficiency of other specified B group vitamins: Secondary | ICD-10-CM | POA: Diagnosis not present

## 2018-11-08 DIAGNOSIS — E2839 Other primary ovarian failure: Secondary | ICD-10-CM

## 2018-11-08 DIAGNOSIS — Z114 Encounter for screening for human immunodeficiency virus [HIV]: Secondary | ICD-10-CM | POA: Diagnosis not present

## 2018-11-08 DIAGNOSIS — R7303 Prediabetes: Secondary | ICD-10-CM | POA: Diagnosis not present

## 2018-11-08 DIAGNOSIS — E559 Vitamin D deficiency, unspecified: Secondary | ICD-10-CM

## 2018-11-08 DIAGNOSIS — E611 Iron deficiency: Secondary | ICD-10-CM

## 2018-11-08 DIAGNOSIS — M8580 Other specified disorders of bone density and structure, unspecified site: Secondary | ICD-10-CM

## 2018-11-08 HISTORY — DX: Other specified disorders of bone density and structure, unspecified site: M85.80

## 2018-11-08 LAB — URINALYSIS, ROUTINE W REFLEX MICROSCOPIC
Bilirubin Urine: NEGATIVE
Ketones, ur: NEGATIVE
Leukocytes,Ua: NEGATIVE
Nitrite: POSITIVE — AB
Specific Gravity, Urine: 1.025 (ref 1.000–1.030)
Total Protein, Urine: NEGATIVE
Urine Glucose: NEGATIVE
Urobilinogen, UA: 0.2 (ref 0.0–1.0)
pH: 6 (ref 5.0–8.0)

## 2018-11-08 LAB — HEPATIC FUNCTION PANEL
ALT: 9 U/L (ref 0–35)
AST: 12 U/L (ref 0–37)
Albumin: 4 g/dL (ref 3.5–5.2)
Alkaline Phosphatase: 60 U/L (ref 39–117)
Bilirubin, Direct: 0 mg/dL (ref 0.0–0.3)
Total Bilirubin: 0.2 mg/dL (ref 0.2–1.2)
Total Protein: 6.8 g/dL (ref 6.0–8.3)

## 2018-11-08 LAB — BASIC METABOLIC PANEL
BUN: 18 mg/dL (ref 6–23)
CO2: 28 mEq/L (ref 19–32)
Calcium: 8.7 mg/dL (ref 8.4–10.5)
Chloride: 104 mEq/L (ref 96–112)
Creatinine, Ser: 0.86 mg/dL (ref 0.40–1.20)
GFR: 67.41 mL/min (ref >60.00)
Glucose, Bld: 90 mg/dL (ref 70–99)
Potassium: 3.7 mEq/L (ref 3.5–5.1)
Sodium: 138 mEq/L (ref 135–145)

## 2018-11-08 LAB — IBC PANEL
Iron: 43 ug/dL (ref 42–145)
Saturation Ratios: 11.5 % — ABNORMAL LOW (ref 20.0–50.0)
Transferrin: 268 mg/dL (ref 212.0–360.0)

## 2018-11-08 LAB — CBC WITH DIFFERENTIAL/PLATELET
Basophils Absolute: 0.1 10*3/uL (ref 0.0–0.1)
Basophils Relative: 0.7 % (ref 0.0–3.0)
Eosinophils Absolute: 0.5 10*3/uL (ref 0.0–0.7)
Eosinophils Relative: 5.4 % — ABNORMAL HIGH (ref 0.0–5.0)
HCT: 38.3 % (ref 36.0–46.0)
Hemoglobin: 12.5 g/dL (ref 12.0–15.0)
Lymphocytes Relative: 29.1 % (ref 12.0–46.0)
Lymphs Abs: 2.4 10*3/uL (ref 0.7–4.0)
MCHC: 32.8 g/dL (ref 30.0–36.0)
MCV: 92.9 fl (ref 78.0–100.0)
Monocytes Absolute: 0.6 10*3/uL (ref 0.1–1.0)
Monocytes Relative: 6.6 % (ref 3.0–12.0)
Neutro Abs: 4.9 10*3/uL (ref 1.4–7.7)
Neutrophils Relative %: 58.2 % (ref 43.0–77.0)
Platelets: 340 10*3/uL (ref 150.0–400.0)
RBC: 4.12 Mil/uL (ref 3.87–5.11)
RDW: 13.9 % (ref 11.5–15.5)
WBC: 8.4 10*3/uL (ref 4.0–10.5)

## 2018-11-08 LAB — LIPID PANEL
Cholesterol: 200 mg/dL (ref 0–200)
HDL: 51.1 mg/dL (ref >39.00)
LDL Cholesterol: 120 mg/dL — ABNORMAL HIGH (ref 0–99)
NonHDL: 149.06
Total CHOL/HDL Ratio: 4
Triglycerides: 144 mg/dL (ref 0.0–149.0)
VLDL: 28.8 mg/dL (ref 0.0–40.0)

## 2018-11-08 LAB — VITAMIN D 25 HYDROXY (VIT D DEFICIENCY, FRACTURES): VITD: 23.51 ng/mL — ABNORMAL LOW (ref 30.00–100.00)

## 2018-11-08 LAB — HEMOGLOBIN A1C: Hgb A1c MFr Bld: 6 % (ref 4.6–6.5)

## 2018-11-08 LAB — VITAMIN B12: Vitamin B-12: 191 pg/mL — ABNORMAL LOW (ref 211–911)

## 2018-11-08 LAB — TSH: TSH: 0.62 u[IU]/mL (ref 0.35–4.50)

## 2018-11-08 MED ORDER — ROSUVASTATIN CALCIUM 20 MG PO TABS: 20.0000 mg | ORAL_TABLET | Freq: Every day | ORAL | 3 refills | Status: DC

## 2018-11-08 MED ORDER — LEVOTHYROXINE SODIUM 75 MCG PO TABS: 75.0000 ug | ORAL_TABLET | Freq: Every day | ORAL | 3 refills | Status: DC

## 2018-11-08 MED FILL — LEVOTHYROXINE 75 MCG TABLET: 75 | 90 days supply | Qty: 90 | Fill #0

## 2018-11-08 MED FILL — ROSUVASTATIN CALCIUM 20 MG: 20 | 90 days supply | Qty: 90 | Fill #0

## 2018-11-08 NOTE — Assessment & Plan Note (Signed)
For vit d level 

## 2018-11-08 NOTE — Assessment & Plan Note (Signed)
For dxa f/u

## 2018-11-08 NOTE — Assessment & Plan Note (Signed)
For a1c with labs

## 2018-11-08 NOTE — Progress Notes (Signed)
Subjective:    Patient ID: Alisha Reed, female    DOB: [birth year], 58 y.o.   MRN: [id number]  HPI  Here for wellness and f/u;  Overall doing ok;  Pt denies Chest pain, worsening SOB, DOE, wheezing, orthopnea, PND, worsening LE edema, palpitations, dizziness or syncope.  Pt denies neurological change such as new headache, facial or extremity weakness.  Pt denies polydipsia, polyuria, or low sugar symptoms. Pt states overall good compliance with treatment and medications, good tolerability, and has been trying to follow appropriate diet.  Pt denies worsening depressive symptoms, suicidal ideation or panic. No fever, night sweats, wt loss, loss of appetite, or other constitutional symptoms.  Pt states good ability with ADL's, has low fall risk, home safety reviewed and adequate, no other significant changes in hearing or vision, and only occasionally active with exercise.   Past Medical History:  Diagnosis Date  . Hypercholesterolemia   . Osteopenia 11/08/2018  . Thyroid disease    Past Surgical History:  Procedure Laterality Date  . cholecystectomy    . FOOT SURGERY Bilateral   . HYSTERECTOMY ABDOMINAL WITH SALPINGECTOMY    . NASAL SINUS SURGERY      reports that she has never smoked. She has never used smokeless tobacco. She reports current alcohol use. She reports that she does not use drugs. family history includes Alzheimer's disease in her paternal grandmother; Glaucoma in her father; Heart attack in her maternal grandfather; Heart disease in her father and mother; Rheum arthritis in her mother; Scleroderma in her mother; Stomach cancer in her maternal grandmother; Throat cancer in her paternal grandfather. No Known Allergies Current Outpatient Medications on File Prior to Visit  Medication Sig Dispense Refill  . celecoxib (CELEBREX) 200 MG capsule TAKE 1 CAPSULE BY MOUTH TWICE A DAY 60 capsule 3  . estradiol (ESTRACE) 0.5 MG tablet     . methocarbamol (ROBAXIN) 500 MG tablet Take 1  tablet (500 mg total) by mouth 2 (two) times daily as needed for muscle spasms. (Patient not taking: Reported on 11/08/2018) 30 tablet 0   No current facility-administered medications on file prior to visit.    Review of Systems Constitutional: Negative for other unusual diaphoresis, sweats, appetite or weight changes HENT: Negative for other worsening hearing loss, ear pain, facial swelling, mouth sores or neck stiffness.   Eyes: Negative for other worsening pain, redness or other visual disturbance.  Respiratory: Negative for other stridor or swelling Cardiovascular: Negative for other palpitations or other chest pain  Gastrointestinal: Negative for worsening diarrhea or loose stools, blood in stool, distention or other pain Genitourinary: Negative for hematuria, flank pain or other change in urine volume.  Musculoskeletal: Negative for myalgias or other joint swelling.  Skin: Negative for other color change, or other wound or worsening drainage.  Neurological: Negative for other syncope or numbness. Hematological: Negative for other adenopathy or swelling Psychiatric/Behavioral: Negative for hallucinations, other worsening agitation, SI, self-injury, or new decreased concentration All other system neg per pt    Objective:   Physical Exam BP 120/68 (BP Location: Left Arm, Patient Position: Sitting, Cuff Size: Normal)   Pulse 88   Temp 98.2 F (36.8 C) (Oral)   Ht 5\' 3"  (1.6 m)   Wt 165 lb (74.8 kg)   SpO2 98%   BMI 29.23 kg/m  VS noted,  Constitutional: Pt is oriented to person, place, and time. Appears well-developed and well-nourished, in no significant distress and comfortable Head: Normocephalic and atraumatic  Eyes: Conjunctivae and EOM are  normal. Pupils are equal, round, and reactive to light Right Ear: External ear normal without discharge Left Ear: External ear normal without discharge Nose: Nose without discharge or deformity Mouth/Throat: Oropharynx is without other  ulcerations and moist  Neck: Normal range of motion. Neck supple. No JVD present. No tracheal deviation present or significant neck LA or mass Cardiovascular: Normal rate, regular rhythm, normal heart sounds and intact distal pulses.   Pulmonary/Chest: WOB normal and breath sounds without rales or wheezing  Abdominal: Soft. Bowel sounds are normal. NT. No HSM  Musculoskeletal: Normal range of motion. Exhibits no edema Lymphadenopathy: Has no other cervical adenopathy.  Neurological: Pt is alert and oriented to person, place, and time. Pt has normal reflexes. No cranial nerve deficit. Motor grossly intact, Gait intact Skin: Skin is warm and dry. No rash noted or new ulcerations Psychiatric:  Has normal mood and affect. Behavior is normal without agitation No other exam findings Lab Results  Component Value Date   WBC 8.4 11/08/2018   HGB 12.5 11/08/2018   HCT 38.3 11/08/2018   PLT 340.0 11/08/2018   GLUCOSE 90 11/08/2018   CHOL 200 11/08/2018   TRIG 144.0 11/08/2018   HDL 51.10 11/08/2018   LDLCALC 120 (H) 11/08/2018   ALT 9 11/08/2018   AST 12 11/08/2018   NA 138 11/08/2018   K 3.7 11/08/2018   CL 104 11/08/2018   CREATININE 0.86 11/08/2018   BUN 18 11/08/2018   CO2 28 11/08/2018   TSH 0.62 11/08/2018   HGBA1C 6.0 11/08/2018      Assessment & Plan:

## 2018-11-08 NOTE — Assessment & Plan Note (Signed)

## 2018-11-08 NOTE — Patient Instructions (Addendum)
Please schedule the bone density test before leaving today at the scheduling desk (where you check out)  Please take all new medication as prescribed - the crestor for cholesterol  Please continue all other medications as before, and refills have been done if requested.  Please have the pharmacy call with any other refills you may need.  Please continue your efforts at being more active, low cholesterol diet, and weight control.  You are otherwise up to date with prevention measures today.  Please keep your appointments with your specialists as you may have planned  Please go to the LAB in the Basement (turn left off the elevator) for the tests to be done today  You will be contacted by phone if any changes need to be made immediately.  Otherwise, you will receive a letter about your results with an explanation, but please check with MyChart first.  Please remember to sign up for MyChart if you have not done so, as this will be important to you in the future with finding out test results, communicating by private email, and scheduling acute appointments online when needed.  Please return in 1 year for your yearly visit, or sooner if needed, with Lab testing done 3-5 days before

## 2018-11-08 NOTE — Assessment & Plan Note (Signed)
D/w pt, ok to start crestor 20 qd, low chol diet

## 2018-11-09 ENCOUNTER — Encounter: Payer: 59 | Admitting: Nurse Practitioner

## 2018-11-09 ENCOUNTER — Telehealth: Payer: Self-pay

## 2018-11-09 ENCOUNTER — Other Ambulatory Visit: Payer: Self-pay | Admitting: Internal Medicine

## 2018-11-09 DIAGNOSIS — R3129 Other microscopic hematuria: Secondary | ICD-10-CM

## 2018-11-09 LAB — HIV ANTIBODY (ROUTINE TESTING W REFLEX): HIV 1&2 Ab, 4th Generation: NONREACTIVE

## 2018-11-09 MED ORDER — VITAMIN D (ERGOCALCIFEROL) 1.25 MG (50000 UNIT) PO CAPS: 50000.0000 [IU] | ORAL_CAPSULE | ORAL | 0 refills | Status: DC

## 2018-11-09 MED ORDER — ROSUVASTATIN CALCIUM 40 MG PO TABS: 40.0000 mg | ORAL_TABLET | Freq: Every day | ORAL | 3 refills | Status: DC

## 2018-11-09 NOTE — Telephone Encounter (Signed)
Pt has viewed results via MyChart  

## 2018-11-09 NOTE — Telephone Encounter (Signed)
-----   Message from Biagio Borg, MD sent at 11/09/2018 12:31 PM EDT ----- Left message on MyChart, pt to cont same tx except The test results show that your current treatment is OK, except for several things:   1) Low Vitamin D 2) Low Vitamin B12 3)  LDL cholesterol still too high 4)  Small amount of blood in the urine, which I believe is new  We need to: 1)  Please take Vitamin D 50000 units weekly for 12 weeks, then plan to change to OTC Vitamin D3 at 2000 units per day, indefinitely. 2)  Start monthly B12 shots here in the office until next visit when we may be able to change to B12 pills 3)  Increase the Crestor to 40 mg per day 4)  Refer to urology to check for a cause of the blood in the urine  Gianmarco Roye to please inform pt, I will do referral and rx x 2, and help start b12 shots

## 2018-11-10 ENCOUNTER — Other Ambulatory Visit (INDEPENDENT_AMBULATORY_CARE_PROVIDER_SITE_OTHER): Payer: Self-pay | Admitting: Physician Assistant

## 2018-11-10 MED FILL — VIT D2 1.25 MG (50,000 UNIT: 1.25 MG | 84 days supply | Qty: 12 | Fill #0

## 2018-11-12 ENCOUNTER — Encounter: Payer: Self-pay | Admitting: Internal Medicine

## 2018-11-17 ENCOUNTER — Encounter: Payer: Self-pay | Admitting: Internal Medicine

## 2018-11-17 ENCOUNTER — Encounter: Payer: Self-pay | Admitting: Orthopaedic Surgery

## 2018-11-17 ENCOUNTER — Other Ambulatory Visit: Payer: Self-pay

## 2018-11-17 ENCOUNTER — Ambulatory Visit (INDEPENDENT_AMBULATORY_CARE_PROVIDER_SITE_OTHER): Payer: 59

## 2018-11-17 ENCOUNTER — Ambulatory Visit (INDEPENDENT_AMBULATORY_CARE_PROVIDER_SITE_OTHER)
Admission: RE | Admit: 2018-11-17 | Discharge: 2018-11-17 | Disposition: A | Discharge status: Home / Self Care | Payer: 59 | Source: Ambulatory Visit | Attending: Internal Medicine | Admitting: Internal Medicine

## 2018-11-17 DIAGNOSIS — M8580 Other specified disorders of bone density and structure, unspecified site: Secondary | ICD-10-CM

## 2018-11-17 DIAGNOSIS — E538 Deficiency of other specified B group vitamins: Secondary | ICD-10-CM

## 2018-11-17 DIAGNOSIS — E2839 Other primary ovarian failure: Secondary | ICD-10-CM | POA: Diagnosis not present

## 2018-11-17 MED ORDER — CYANOCOBALAMIN 1000 MCG/ML IJ SOLN
1000.0000 ug | Freq: Once | INTRAMUSCULAR | Status: AC
  Administered 2018-11-17: 1000 ug via INTRAMUSCULAR

## 2018-11-17 NOTE — Progress Notes (Signed)
Medical screening examination/treatment/procedure(s) were performed by non-physician practitioner and as supervising physician I was immediately available for consultation/collaboration. I agree with above. [name], MD   

## 2018-11-18 MED ORDER — CELECOXIB 200 MG PO CAPS: 200.0000 mg | ORAL_CAPSULE | Freq: Two times a day (BID) | ORAL | 0 refills | Status: DC

## 2018-11-18 MED FILL — CELECOXIB 200 MG CAP: 200 | 30 days supply | Qty: 60 | Fill #0

## 2018-11-18 NOTE — Telephone Encounter (Signed)
I have not seen any phone messages in chart from either pharmacy.  Are you able to see any?  Either way, ok to refill with same instructions as previous rx

## 2018-12-05 ENCOUNTER — Telehealth: Payer: Self-pay | Admitting: Orthopaedic Surgery

## 2018-12-05 NOTE — Telephone Encounter (Signed)
Called patient left voicemail message  To call back to schedule an appointment with Dr Erlinda Hong for eval for carpal tunnel surgery in September

## 2018-12-21 ENCOUNTER — Ambulatory Visit (INDEPENDENT_AMBULATORY_CARE_PROVIDER_SITE_OTHER): Payer: 59

## 2018-12-21 DIAGNOSIS — E538 Deficiency of other specified B group vitamins: Secondary | ICD-10-CM | POA: Diagnosis not present

## 2018-12-21 MED ORDER — CYANOCOBALAMIN 1000 MCG/ML IJ SOLN
1000.0000 ug | Freq: Once | INTRAMUSCULAR | Status: AC
  Administered 2018-12-21: 1000 ug via INTRAMUSCULAR

## 2018-12-21 NOTE — Progress Notes (Signed)
Medical screening examination/treatment/procedure(s) were performed by non-physician practitioner and as supervising physician I was immediately available for consultation/collaboration. I agree with above. Santonio Speakman, MD   

## 2018-12-22 ENCOUNTER — Ambulatory Visit: Payer: 59

## 2018-12-26 ENCOUNTER — Other Ambulatory Visit: Payer: Self-pay | Admitting: Orthopaedic Surgery

## 2018-12-26 MED ORDER — CELECOXIB 200 MG PO CAPS: 200.0000 mg | ORAL_CAPSULE | Freq: Two times a day (BID) | ORAL | 12 refills | Status: DC

## 2018-12-26 MED FILL — CELECOXIB 200 MG CAP: 200 | 30 days supply | Qty: 60 | Fill #0

## 2018-12-29 ENCOUNTER — Other Ambulatory Visit: Payer: Self-pay

## 2018-12-29 ENCOUNTER — Ambulatory Visit (INDEPENDENT_AMBULATORY_CARE_PROVIDER_SITE_OTHER): Payer: 59

## 2018-12-29 ENCOUNTER — Ambulatory Visit (INDEPENDENT_AMBULATORY_CARE_PROVIDER_SITE_OTHER): Payer: 59 | Admitting: Orthopaedic Surgery

## 2018-12-29 ENCOUNTER — Encounter: Payer: Self-pay | Admitting: Orthopaedic Surgery

## 2018-12-29 VITALS — Ht 63.0 in | Wt 160.0 lb

## 2018-12-29 DIAGNOSIS — G5601 Carpal tunnel syndrome, right upper limb: Secondary | ICD-10-CM | POA: Diagnosis not present

## 2018-12-29 DIAGNOSIS — M25512 Pain in left shoulder: Secondary | ICD-10-CM

## 2018-12-29 DIAGNOSIS — G8929 Other chronic pain: Secondary | ICD-10-CM

## 2018-12-29 DIAGNOSIS — M25561 Pain in right knee: Secondary | ICD-10-CM

## 2018-12-29 DIAGNOSIS — G5602 Carpal tunnel syndrome, left upper limb: Secondary | ICD-10-CM | POA: Insufficient documentation

## 2018-12-29 MED ORDER — BUPIVACAINE HCL 0.5 % IJ SOLN
2.0000 mL | INTRAMUSCULAR | Status: AC | PRN
  Administered 2018-12-29: 2 mL via INTRA_ARTICULAR

## 2018-12-29 MED ORDER — LIDOCAINE HCL 1 % IJ SOLN
2.0000 mL | INTRAMUSCULAR | Status: AC | PRN
  Administered 2018-12-29: 2 mL

## 2018-12-29 MED ORDER — METHYLPREDNISOLONE ACETATE 40 MG/ML IJ SUSP
40.0000 mg | INTRAMUSCULAR | Status: AC | PRN
  Administered 2018-12-29: 40 mg via INTRA_ARTICULAR

## 2018-12-29 NOTE — Progress Notes (Signed)
Subjective: Patient is here for ultrasound-guided intra-articular left glenohumeral injection.  Previous injection helped for about a year.  Objective: Pain and decreased range of motion with shoulder movement.  Procedure: Ultrasound-guided left glenohumeral injection: After sterile prep with Betadine, injected 8 cc 1% lidocaine without epinephrine and 40 mg methylprednisolone using a 22-gauge spinal needle, passing the needle through approach into the glenohumeral joint.  Injectate was seen filling the joint capsule.  Follow-up as needed.

## 2018-12-29 NOTE — Progress Notes (Signed)
Office Visit Note   Patient: Alisha Reed           Date of Birth: 01-Jan-1961           MRN: [id number] Visit Date: 12/29/2018              Requested by: Biagio Borg, MD [address],  [place] [id number] PCP: Biagio Borg, MD   Assessment & Plan: Visit Diagnoses:  1. Chronic pain of right knee   2. Right carpal tunnel syndrome   3. Left carpal tunnel syndrome   4. Chronic left shoulder pain     Plan: Impression is worsening bilateral carpal tunnel syndrome.  Alisha Reed would like to proceed with bilateral carpal tunnel releases at this point.  She will decide when this works for her.  Risks benefits and rehab and recovery were discussed.  In terms of the right knee my impression is that could be an OA flareup versus a degenerative medial meniscal tear.  Cortisone was injected today.  She will take it easy over the next month or so.  For the left shoulder sounds like current rotator cuff tendinopathy is flared up again.  We will do another glenohumeral injection with Dr. Junius Roads today.  She is done well from previous injection  Follow-Up Instructions: Return for 1 week postop visit.   Orders:  Orders Placed This Encounter  Procedures  . XR KNEE 3 VIEW RIGHT   No orders of the defined types were placed in this encounter.     Procedures: Large Joint Inj: R knee on 12/29/2018 9:58 AM Indications: pain Details: 22 G needle  Arthrogram: No  Medications: 40 mg methylPREDNISolone acetate 40 MG/ML; 2 mL lidocaine 1 %; 2 mL bupivacaine 0.5 % Consent was given by the patient. Patient was prepped and draped in the usual sterile fashion.       Clinical Data: No additional findings.   Subjective: Chief Complaint  Patient presents with  . Right Wrist - Follow-up  . Left Wrist - Follow-up  . Right Knee - Pain    Alisha Reed comes back today for follow-up of several issues including bilateral carpal tunnel syndrome, right knee pain, left shoulder pain.  For the right  knee pain that she states that it has been hurting for about 2 months and she denies any injuries.  She states that it aches and throbs at night and keeps her awake.  Denies any swelling or mechanical symptoms.  Does feel like it wants to give out and is mainly on the medial side of the knee.  For the carpal tunnel syndrome she is decided that she wants to proceed with carpal tunnel release as her symptoms have gotten severely worse.  She has had previous nerve conduction studies about 3 years ago which showed moderate carpal tunnel syndrome.  She has had good relief from prior cortisone injections.  For the left shoulder she started to have the lateral deltoid shoulder pain again.  Denies any injuries.  She is having good relief from previous injections as well.  She has had an MRI in the past which showed partial articular tearing of the supraspinatus with a focal high-grade area.   Review of Systems  Constitutional: Negative.   HENT: Negative.   Eyes: Negative.   Respiratory: Negative.   Cardiovascular: Negative.   Endocrine: Negative.   Musculoskeletal: Negative.   Neurological: Negative.   Hematological: Negative.   Psychiatric/Behavioral: Negative.   All other systems reviewed  and are negative.    Objective: Vital Signs: Ht 5\' 3"  (1.6 m)   Wt 160 lb (72.6 kg)   BMI 28.34 kg/m   Physical Exam Vitals signs and nursing note reviewed.  Constitutional:      Appearance: She is well-developed.  Pulmonary:     Effort: Pulmonary effort is normal.  Skin:    General: Skin is warm.     Capillary Refill: Capillary refill takes less than 2 seconds.  Neurological:     Mental Status: She is alert and oriented to person, place, and time.  Psychiatric:        Behavior: Behavior normal.        Thought Content: Thought content normal.        Judgment: Judgment normal.     Ortho Exam Bilateral hand and wrist exams are unchanged. Right knee exam shows no joint effusion.  Moderate medial  joint line tenderness.  Collaterals and cruciates are stable.  Full range of motion. Left shoulder exam shows good strength with supraspinatus testing.  She does have pain with empty can testing.  Range of motion is well-preserved. Specialty Comments:  No specialty comments available.  Imaging: Xr Knee 3 View Right  Result Date: 12/29/2018 Minimal OA    PMFS History: Patient Active Problem List   Diagnosis Date Noted  . Chronic pain of right knee 12/29/2018  . Right carpal tunnel syndrome 12/29/2018  . Left carpal tunnel syndrome 12/29/2018  . Osteopenia 11/08/2018  . Pre-diabetes 05/02/2018  . Acute pain of right shoulder 03/18/2018  . Hyperlipidemia 11/04/2017  . Vitamin D deficiency 11/04/2017  . S/P cholecystectomy 08/02/2017  . Skier's thumb, right, subsequent encounter 03/15/2017  . Chronic left shoulder pain 02/08/2017  . Nondisplaced fracture of greater tuberosity of left humerus, initial encounter for closed fracture 01/18/2017  . Preventative health care 09/24/2016  . Hypothyroidism 09/24/2016   Past Medical History:  Diagnosis Date  . Hypercholesterolemia   . Osteopenia 11/08/2018  . Thyroid disease     Family History  Problem Relation Age of Onset  . Scleroderma Mother   . Rheum arthritis Mother   . Heart disease Mother   . Heart disease Father   . Glaucoma Father   . Stomach cancer Maternal Grandmother   . Heart attack Maternal Grandfather   . Alzheimer's disease Paternal Grandmother   . Throat cancer Paternal Grandfather   . Colon cancer Neg Hx   . Liver cancer Neg Hx     Past Surgical History:  Procedure Laterality Date  . cholecystectomy    . FOOT SURGERY Bilateral   . HYSTERECTOMY ABDOMINAL WITH SALPINGECTOMY    . NASAL SINUS SURGERY     Social History   Occupational History  . Occupation: Therapist, sports  Tobacco Use  . Smoking status: Never Smoker  . Smokeless tobacco: Never Used  Substance and Sexual Activity  . Alcohol use: Yes    Comment:  Scoial  . Drug use: No  . Sexual activity: Yes    Birth control/protection: Surgical

## 2018-12-30 ENCOUNTER — Encounter: Payer: Self-pay | Admitting: Internal Medicine

## 2018-12-30 MED ORDER — CYANOCOBALAMIN 1000 MCG/ML IJ SOLN: INTRAMUSCULAR | 3 refills | Status: DC

## 2019-01-02 MED FILL — CYANOCOBALAMIN 1,000 MCG/ML: 1000 | 90 days supply | Qty: 3 | Fill #0

## 2019-01-03 ENCOUNTER — Other Ambulatory Visit (HOSPITAL_COMMUNITY)
Admission: RE | Admit: 2019-01-03 | Discharge: 2019-01-03 | Disposition: A | Discharge status: Home / Self Care | Payer: 59 | Source: Ambulatory Visit | Attending: Urology | Admitting: Urology

## 2019-01-03 ENCOUNTER — Other Ambulatory Visit: Payer: Self-pay

## 2019-01-03 ENCOUNTER — Ambulatory Visit (INDEPENDENT_AMBULATORY_CARE_PROVIDER_SITE_OTHER): Payer: 59 | Admitting: Urology

## 2019-01-03 DIAGNOSIS — R311 Benign essential microscopic hematuria: Secondary | ICD-10-CM | POA: Diagnosis not present

## 2019-01-03 DIAGNOSIS — N393 Stress incontinence (female) (male): Secondary | ICD-10-CM | POA: Diagnosis not present

## 2019-01-03 LAB — URINALYSIS, COMPLETE (UACMP) WITH MICROSCOPIC
Bilirubin Urine: NEGATIVE
Glucose, UA: NEGATIVE mg/dL
Ketones, ur: NEGATIVE mg/dL
Leukocytes,Ua: NEGATIVE
Nitrite: NEGATIVE
Protein, ur: NEGATIVE mg/dL
Specific Gravity, Urine: 1.003 — ABNORMAL LOW (ref 1.005–1.030)
pH: 6 (ref 5.0–8.0)

## 2019-01-09 ENCOUNTER — Encounter (HOSPITAL_BASED_OUTPATIENT_CLINIC_OR_DEPARTMENT_OTHER): Payer: Self-pay | Admitting: *Deleted

## 2019-01-09 ENCOUNTER — Other Ambulatory Visit: Payer: Self-pay

## 2019-01-14 ENCOUNTER — Other Ambulatory Visit (HOSPITAL_COMMUNITY)
Admission: RE | Admit: 2019-01-14 | Discharge: 2019-01-14 | Disposition: A | Discharge status: Home / Self Care | Payer: 59 | Source: Ambulatory Visit | Attending: Orthopaedic Surgery | Admitting: Orthopaedic Surgery

## 2019-01-14 DIAGNOSIS — Z20828 Contact with and (suspected) exposure to other viral communicable diseases: Secondary | ICD-10-CM | POA: Diagnosis not present

## 2019-01-14 DIAGNOSIS — Z01812 Encounter for preprocedural laboratory examination: Secondary | ICD-10-CM | POA: Insufficient documentation

## 2019-01-15 LAB — NOVEL CORONAVIRUS, NAA (HOSP ORDER, SEND-OUT TO REF LAB; TAT 18-24 HRS): SARS-CoV-2, NAA: NOT DETECTED

## 2019-01-18 ENCOUNTER — Encounter (HOSPITAL_BASED_OUTPATIENT_CLINIC_OR_DEPARTMENT_OTHER): Payer: Self-pay | Admitting: *Deleted

## 2019-01-18 ENCOUNTER — Ambulatory Visit (HOSPITAL_BASED_OUTPATIENT_CLINIC_OR_DEPARTMENT_OTHER): Payer: 59 | Admitting: Certified Registered"

## 2019-01-18 ENCOUNTER — Other Ambulatory Visit: Payer: Self-pay

## 2019-01-18 ENCOUNTER — Ambulatory Visit: Payer: 59

## 2019-01-18 ENCOUNTER — Encounter (HOSPITAL_BASED_OUTPATIENT_CLINIC_OR_DEPARTMENT_OTHER)
Admission: RE | Disposition: A | Discharge status: Home / Self Care | Payer: Self-pay | Source: Home / Self Care | Attending: Orthopaedic Surgery

## 2019-01-18 ENCOUNTER — Ambulatory Visit (HOSPITAL_BASED_OUTPATIENT_CLINIC_OR_DEPARTMENT_OTHER)
Admission: RE | Admit: 2019-01-18 | Discharge: 2019-01-18 | Disposition: A | Discharge status: Home / Self Care | Payer: 59 | Attending: Orthopaedic Surgery | Admitting: Orthopaedic Surgery

## 2019-01-18 DIAGNOSIS — Z800 Family history of malignant neoplasm of digestive organs: Secondary | ICD-10-CM | POA: Diagnosis not present

## 2019-01-18 DIAGNOSIS — E039 Hypothyroidism, unspecified: Secondary | ICD-10-CM | POA: Diagnosis not present

## 2019-01-18 DIAGNOSIS — G5603 Carpal tunnel syndrome, bilateral upper limbs: Secondary | ICD-10-CM | POA: Insufficient documentation

## 2019-01-18 DIAGNOSIS — Z8261 Family history of arthritis: Secondary | ICD-10-CM | POA: Insufficient documentation

## 2019-01-18 DIAGNOSIS — M8580 Other specified disorders of bone density and structure, unspecified site: Secondary | ICD-10-CM | POA: Insufficient documentation

## 2019-01-18 DIAGNOSIS — Z8249 Family history of ischemic heart disease and other diseases of the circulatory system: Secondary | ICD-10-CM | POA: Diagnosis not present

## 2019-01-18 DIAGNOSIS — M19041 Primary osteoarthritis, right hand: Secondary | ICD-10-CM | POA: Insufficient documentation

## 2019-01-18 DIAGNOSIS — M19042 Primary osteoarthritis, left hand: Secondary | ICD-10-CM | POA: Diagnosis not present

## 2019-01-18 DIAGNOSIS — Z79899 Other long term (current) drug therapy: Secondary | ICD-10-CM | POA: Diagnosis not present

## 2019-01-18 DIAGNOSIS — M1990 Unspecified osteoarthritis, unspecified site: Secondary | ICD-10-CM | POA: Diagnosis not present

## 2019-01-18 DIAGNOSIS — G5601 Carpal tunnel syndrome, right upper limb: Secondary | ICD-10-CM | POA: Diagnosis present

## 2019-01-18 DIAGNOSIS — Z90710 Acquired absence of both cervix and uterus: Secondary | ICD-10-CM | POA: Insufficient documentation

## 2019-01-18 DIAGNOSIS — Z791 Long term (current) use of non-steroidal anti-inflammatories (NSAID): Secondary | ICD-10-CM | POA: Insufficient documentation

## 2019-01-18 DIAGNOSIS — Z9049 Acquired absence of other specified parts of digestive tract: Secondary | ICD-10-CM | POA: Diagnosis not present

## 2019-01-18 DIAGNOSIS — K219 Gastro-esophageal reflux disease without esophagitis: Secondary | ICD-10-CM | POA: Diagnosis not present

## 2019-01-18 DIAGNOSIS — E785 Hyperlipidemia, unspecified: Secondary | ICD-10-CM | POA: Diagnosis not present

## 2019-01-18 DIAGNOSIS — E7800 Pure hypercholesterolemia, unspecified: Secondary | ICD-10-CM | POA: Insufficient documentation

## 2019-01-18 DIAGNOSIS — Z820 Family history of epilepsy and other diseases of the nervous system: Secondary | ICD-10-CM | POA: Diagnosis not present

## 2019-01-18 DIAGNOSIS — G5602 Carpal tunnel syndrome, left upper limb: Secondary | ICD-10-CM | POA: Diagnosis present

## 2019-01-18 HISTORY — DX: Carpal tunnel syndrome, bilateral upper limbs: G56.03

## 2019-01-18 HISTORY — DX: Unspecified osteoarthritis, unspecified site: M19.90

## 2019-01-18 HISTORY — PX: BILATERAL CARPAL TUNNEL RELEASE: SHX6508

## 2019-01-18 HISTORY — DX: Gastro-esophageal reflux disease without esophagitis: K21.9

## 2019-01-18 HISTORY — DX: Hypothyroidism, unspecified: E03.9

## 2019-01-18 SURGERY — BILATERAL CARPAL TUNNEL RELEASE
Anesthesia: General | Site: Hand | Laterality: Bilateral

## 2019-01-18 MED ORDER — PROMETHAZINE HCL 25 MG/ML IJ SOLN: 6.2500 mg | INTRAMUSCULAR | Status: DC | PRN

## 2019-01-18 MED ORDER — CEFAZOLIN SODIUM-DEXTROSE 2-4 GM/100ML-% IV SOLN: 2.0000 g | INTRAVENOUS | Status: DC

## 2019-01-18 MED ORDER — ONDANSETRON HCL 4 MG PO TABS: 4.0000 mg | ORAL_TABLET | Freq: Three times a day (TID) | ORAL | 0 refills | Status: DC | PRN

## 2019-01-18 MED ORDER — CEFAZOLIN SODIUM-DEXTROSE 2-4 GM/100ML-% IV SOLN
INTRAVENOUS | Status: AC
  Filled 2019-01-18: qty 100

## 2019-01-18 MED ORDER — ONDANSETRON HCL 4 MG/2ML IJ SOLN
INTRAMUSCULAR | Status: AC
  Filled 2019-01-18: qty 2

## 2019-01-18 MED ORDER — LACTATED RINGERS IV SOLN
INTRAVENOUS | Status: DC
  Administered 2019-01-18: 10:00:00 via INTRAVENOUS

## 2019-01-18 MED ORDER — FENTANYL CITRATE (PF) 100 MCG/2ML IJ SOLN
50.0000 ug | INTRAMUSCULAR | Status: DC | PRN
  Administered 2019-01-18: 100 ug via INTRAVENOUS

## 2019-01-18 MED ORDER — LIDOCAINE 2% (20 MG/ML) 5 ML SYRINGE
INTRAMUSCULAR | Status: AC
  Filled 2019-01-18: qty 5

## 2019-01-18 MED ORDER — FENTANYL CITRATE (PF) 100 MCG/2ML IJ SOLN
INTRAMUSCULAR | Status: AC
  Filled 2019-01-18: qty 2

## 2019-01-18 MED ORDER — ACETAMINOPHEN 10 MG/ML IV SOLN: 1000.0000 mg | Freq: Once | INTRAVENOUS | Status: DC | PRN

## 2019-01-18 MED ORDER — MIDAZOLAM HCL 2 MG/2ML IJ SOLN
1.0000 mg | INTRAMUSCULAR | Status: DC | PRN
  Administered 2019-01-18: 2 mg via INTRAVENOUS

## 2019-01-18 MED ORDER — CEFAZOLIN SODIUM-DEXTROSE 2-3 GM-%(50ML) IV SOLR
INTRAVENOUS | Status: DC | PRN
  Administered 2019-01-18: 2 g via INTRAVENOUS

## 2019-01-18 MED ORDER — HYDROCODONE-ACETAMINOPHEN 7.5-325 MG PO TABS: 1.0000 | ORAL_TABLET | Freq: Three times a day (TID) | ORAL | 0 refills | Status: DC | PRN

## 2019-01-18 MED ORDER — LACTATED RINGERS IV SOLN
INTRAVENOUS | Status: DC
  Administered 2019-01-18: 11:00:00 via INTRAVENOUS

## 2019-01-18 MED ORDER — CHLORHEXIDINE GLUCONATE 4 % EX LIQD: 60.0000 mL | Freq: Once | CUTANEOUS | Status: DC

## 2019-01-18 MED ORDER — BUPIVACAINE HCL (PF) 0.25 % IJ SOLN
INTRAMUSCULAR | Status: DC | PRN
  Administered 2019-01-18: 18 mL

## 2019-01-18 MED ORDER — OXYCODONE HCL 5 MG PO TABS
ORAL_TABLET | ORAL | Status: AC
  Filled 2019-01-18: qty 1

## 2019-01-18 MED ORDER — FENTANYL CITRATE (PF) 100 MCG/2ML IJ SOLN
25.0000 ug | INTRAMUSCULAR | Status: DC | PRN
  Administered 2019-01-18 (×2): 50 ug via INTRAVENOUS

## 2019-01-18 MED ORDER — DEXAMETHASONE SODIUM PHOSPHATE 10 MG/ML IJ SOLN
INTRAMUSCULAR | Status: DC | PRN
  Administered 2019-01-18: 10 mg via INTRAVENOUS

## 2019-01-18 MED ORDER — SCOPOLAMINE 1 MG/3DAYS TD PT72: 1.0000 | MEDICATED_PATCH | Freq: Once | TRANSDERMAL | Status: DC

## 2019-01-18 MED ORDER — LIDOCAINE HCL (CARDIAC) PF 100 MG/5ML IV SOSY
PREFILLED_SYRINGE | INTRAVENOUS | Status: DC | PRN
  Administered 2019-01-18: 100 mg via INTRAVENOUS

## 2019-01-18 MED ORDER — DEXAMETHASONE SODIUM PHOSPHATE 10 MG/ML IJ SOLN
INTRAMUSCULAR | Status: AC
  Filled 2019-01-18: qty 1

## 2019-01-18 MED ORDER — OXYCODONE HCL 5 MG PO TABS
5.0000 mg | ORAL_TABLET | Freq: Once | ORAL | Status: AC | PRN
  Administered 2019-01-18: 5 mg via ORAL

## 2019-01-18 MED ORDER — 0.9 % SODIUM CHLORIDE (POUR BTL) OPTIME
TOPICAL | Status: DC | PRN
  Administered 2019-01-18: 500 mL

## 2019-01-18 MED ORDER — PHENYLEPHRINE HCL (PRESSORS) 10 MG/ML IV SOLN
INTRAVENOUS | Status: DC | PRN
  Administered 2019-01-18: 120 ug via INTRAVENOUS
  Administered 2019-01-18: 40 ug via INTRAVENOUS

## 2019-01-18 MED ORDER — PROPOFOL 10 MG/ML IV BOLUS
INTRAVENOUS | Status: DC | PRN
  Administered 2019-01-18: 150 mg via INTRAVENOUS

## 2019-01-18 MED ORDER — MIDAZOLAM HCL 2 MG/2ML IJ SOLN
INTRAMUSCULAR | Status: AC
  Filled 2019-01-18: qty 2

## 2019-01-18 MED ORDER — OXYCODONE HCL 5 MG/5ML PO SOLN: 5.0000 mg | Freq: Once | ORAL | Status: AC | PRN

## 2019-01-18 MED ORDER — PROPOFOL 10 MG/ML IV BOLUS
INTRAVENOUS | Status: AC
  Filled 2019-01-18: qty 40

## 2019-01-18 MED FILL — ONDANSETRON HCL 4 MG TABLET: 4 | 7 days supply | Qty: 40 | Fill #0

## 2019-01-18 MED FILL — HYDROCODON-APAP 7.5-325: 7.5-325 | 5 days supply | Qty: 30 | Fill #0

## 2019-01-18 SURGICAL SUPPLY — 44 items
BAND RUBBER #18 3X1/16 STRL (MISCELLANEOUS) ×4 IMPLANT
BLADE MINI RND TIP GREEN BEAV (BLADE) ×2 IMPLANT
BLADE SURG 15 STRL LF DISP TIS (BLADE) ×1 IMPLANT
BLADE SURG 15 STRL SS (BLADE) ×1
BNDG ELASTIC 3X5.8 VLCR STR LF (GAUZE/BANDAGES/DRESSINGS) ×4 IMPLANT
BNDG ESMARK 4X9 LF (GAUZE/BANDAGES/DRESSINGS) ×2 IMPLANT
BNDG PLASTER X FAST 3X3 WHT LF (CAST SUPPLIES) IMPLANT
BRUSH SCRUB EZ PLAIN DRY (MISCELLANEOUS) ×4 IMPLANT
CANISTER SUCT 1200ML W/VALVE (MISCELLANEOUS) ×2 IMPLANT
CORD BIPOLAR FORCEPS 12FT (ELECTRODE) ×2 IMPLANT
COVER BACK TABLE REUSABLE LG (DRAPES) ×2 IMPLANT
COVER MAYO STAND REUSABLE (DRAPES) ×4 IMPLANT
COVER WAND RF STERILE (DRAPES) IMPLANT
CUFF TOURN SGL QUICK 18X4 (TOURNIQUET CUFF) IMPLANT
DECANTER SPIKE VIAL GLASS SM (MISCELLANEOUS) IMPLANT
DRAPE EXTREMITY T 121X128X90 (DISPOSABLE) ×4 IMPLANT
DRAPE IMP U-DRAPE 54X76 (DRAPES) ×4 IMPLANT
DRAPE SURG 17X23 STRL (DRAPES) ×4 IMPLANT
GAUZE 4X4 16PLY RFD (DISPOSABLE) ×2 IMPLANT
GAUZE SPONGE 4X4 12PLY STRL (GAUZE/BANDAGES/DRESSINGS) ×2 IMPLANT
GAUZE XEROFORM 1X8 LF (GAUZE/BANDAGES/DRESSINGS) ×2 IMPLANT
GLOVE BIOGEL PI IND STRL 7.0 (GLOVE) ×1 IMPLANT
GLOVE BIOGEL PI INDICATOR 7.0 (GLOVE) ×1
GLOVE ECLIPSE 7.0 STRL STRAW (GLOVE) ×2 IMPLANT
GLOVE SKINSENSE NS SZ7.5 (GLOVE) ×1
GLOVE SKINSENSE STRL SZ7.5 (GLOVE) ×1 IMPLANT
GLOVE SURG SYN 7.5  E (GLOVE) ×1
GLOVE SURG SYN 7.5 E (GLOVE) ×1 IMPLANT
GOWN STRL REIN XL XLG (GOWN DISPOSABLE) ×2 IMPLANT
GOWN STRL REUS W/ TWL XL LVL3 (GOWN DISPOSABLE) ×2 IMPLANT
GOWN STRL REUS W/TWL XL LVL3 (GOWN DISPOSABLE) ×2
NEEDLE HYPO 25X1 1.5 SAFETY (NEEDLE) IMPLANT
NS IRRIG 1000ML POUR BTL (IV SOLUTION) ×2 IMPLANT
PACK BASIN DAY SURGERY FS (CUSTOM PROCEDURE TRAY) ×2 IMPLANT
PAD CAST 3X4 CTTN HI CHSV (CAST SUPPLIES) ×2 IMPLANT
PADDING CAST COTTON 3X4 STRL (CAST SUPPLIES) ×2
STOCKINETTE 4X48 STRL (DRAPES) ×4 IMPLANT
SUT ETHILON 3 0 PS 1 (SUTURE) ×2 IMPLANT
SYR BULB 3OZ (MISCELLANEOUS) ×2 IMPLANT
SYR CONTROL 10ML LL (SYRINGE) IMPLANT
TOWEL GREEN STERILE FF (TOWEL DISPOSABLE) ×2 IMPLANT
TRAY DSU PREP LF (CUSTOM PROCEDURE TRAY) ×2 IMPLANT
TUBE CONNECTING 20X1/4 (TUBING) IMPLANT
UNDERPAD 30X36 HEAVY ABSORB (UNDERPADS AND DIAPERS) ×4 IMPLANT

## 2019-01-18 NOTE — Anesthesia Preprocedure Evaluation (Addendum)
Anesthesia Evaluation  Patient identified by MRN, date of birth, ID band Patient awake    Reviewed: Allergy & Precautions, NPO status , Patient's Chart, lab work & pertinent test results  History of Anesthesia Complications Negative for: history of anesthetic complications  Airway Mallampati: II  TM Distance: >3 FB Neck ROM: Full    Dental no notable dental hx.    Pulmonary neg pulmonary ROS,    Pulmonary exam normal        Cardiovascular negative cardio ROS Normal cardiovascular exam     Neuro/Psych negative neurological ROS  negative psych ROS   GI/Hepatic Neg liver ROS, GERD  Medicated and Controlled,  Endo/Other  Hypothyroidism   Renal/GU negative Renal ROS     Musculoskeletal  (+) Arthritis ,   Abdominal   Peds  Hematology negative hematology ROS (+)   Anesthesia Other Findings Bilateral carpal tunnel syndrome  Reproductive/Obstetrics negative OB ROS                            Anesthesia Physical Anesthesia Plan  ASA: II  Anesthesia Plan: General   Post-op Pain Management:    Induction: Intravenous  PONV Risk Score and Plan: 3 and Treatment may vary due to age or medical condition, Ondansetron, Dexamethasone and Midazolam  Airway Management Planned: LMA  Additional Equipment:   Intra-op Plan:   Post-operative Plan: Extubation in OR  Informed Consent: I have reviewed the patients History and Physical, chart, labs and discussed the procedure including the risks, benefits and alternatives for the proposed anesthesia with the patient or authorized representative who has indicated his/her understanding and acceptance.     Dental advisory given  Plan Discussed with: CRNA  Anesthesia Plan Comments:        Anesthesia Quick Evaluation

## 2019-01-18 NOTE — Transfer of Care (Signed)
Immediate Anesthesia Transfer of Care Note  Patient: Antwanette Verdusco  Procedure(s) Performed: BILATERAL CARPAL TUNNEL RELEASE (Bilateral Hand)  Patient Location: PACU  Anesthesia Type:General  Level of Consciousness: awake, alert  and oriented  Airway & Oxygen Therapy: Patient Spontanous Breathing and Patient connected to face mask oxygen  Post-op Assessment: Report given to RN and Post -op Vital signs reviewed and stable  Post vital signs: Reviewed and stable  Last Vitals:  Vitals Value Taken Time  BP 156/92 01/18/19 1146  Temp    Pulse 89 01/18/19 1148  Resp 23 01/18/19 1148  SpO2 100 % 01/18/19 1148  Vitals shown include unvalidated device data.  Last Pain:  Vitals:   01/18/19 0851  TempSrc: Oral  PainSc: 0-No pain      Patients Stated Pain Goal: 5 ([id number])  Complications: No apparent anesthesia complications

## 2019-01-18 NOTE — Op Note (Signed)
   Carpal tunnel op note  DATE OF SURGERY:01/18/2019  PREOPERATIVE DIAGNOSIS:  Bilateral carpal tunnel syndrome  POSTOPERATIVE DIAGNOSIS: same  PROCEDURE:  Bilateral carpal tunnel release. CPT [phone] x 2  SURGEON: Surgeon(s): Leandrew Koyanagi, MD  ASSIST: Madalyn Rob, PA-C; necessary for the timely completion of procedure and due to complexity of procedure.  ANESTHESIA:  General  TOURNIQUET TIME: less than 20 minutes on each side  BLOOD LOSS: Minimal.  COMPLICATIONS: None.  PATHOLOGY: None.  INDICATIONS: The patient is a 58 y.o. -year-old female who presented with carpal tunnel syndrome failing nonsurgical management, indicated for surgical release.  DESCRIPTION OF PROCEDURE: The patient was identified in the preoperative holding area.  The operative sites were marked by the surgeon and confirmed by the patient.  He was brought back to the operating room.  Anesthesia was induced by the anesthesia team.  A well padded nonsterile tourniquet was placed. The operative extremities were prepped and draped in standard sterile fashion.  A timeout was performed.  Preoperative antibiotics were given.   We first began with the right hand.  A palmar incision was made about 5 mm ulnar to the thenar crease.  The palmar aponeurosis was exposed and divided in line with the skin incision. The palmaris brevis was visualized and divided.  The distal edge of the transcarpal ligament was identified. A hemostat was inserted into the carpal tunnel to protect the median nerve and the flexor tendons. Then, the transverse carpal ligament was released under direct visualization. Proximally, a subcutaneous tunnel was made allowing a Sewell retractor to be placed. Then, the distal portion of the antebrachial fascia was released. Distally, all fibrous bands were released. The median nerve was visualized, and the fat pad was exposed. Following release, local infiltration with 0.25% of Sensorcaine was given. The  tourniquet was deflated. Hemostasis achieved.  Wound was irrigated and closed with 4-0 nylon sutures. Sterile dressing applied. We then turned our attention to the left hand. The same surgical steps were carried out for the left hand.  The patient was transferred to the recovery room in stable condition after all counts were correct.  POSTOPERATIVE PLAN: To start nerve gliding exercises as tolerated and no heavy lifting for four weeks.

## 2019-01-18 NOTE — Anesthesia Procedure Notes (Signed)
Procedure Name: LMA Insertion Performed by: Michael Ventresca M, CRNA Pre-anesthesia Checklist: Patient identified, Emergency Drugs available, Suction available, Patient being monitored and Timeout performed Patient Re-evaluated:Patient Re-evaluated prior to induction Oxygen Delivery Method: Circle system utilized Preoxygenation: Pre-oxygenation with 100% oxygen Induction Type: IV induction LMA: LMA inserted LMA Size: 4.0 Tube type: Oral Number of attempts: 1 Placement Confirmation: positive ETCO2,  CO2 detector and breath sounds checked- equal and bilateral Tube secured with: Tape Dental Injury: Teeth and Oropharynx as per pre-operative assessment        

## 2019-01-18 NOTE — Anesthesia Postprocedure Evaluation (Signed)
Anesthesia Post Note  Patient: Alisha Reed  Procedure(s) Performed: BILATERAL CARPAL TUNNEL RELEASE (Bilateral Hand)     Patient location during evaluation: PACU Anesthesia Type: General Level of consciousness: awake and alert and oriented Pain management: pain level controlled Vital Signs Assessment: post-procedure vital signs reviewed and stable Respiratory status: spontaneous breathing, nonlabored ventilation and respiratory function stable Cardiovascular status: blood pressure returned to baseline Postop Assessment: no apparent nausea or vomiting Anesthetic complications: no    Last Vitals:  Vitals:   01/18/19 1215 01/18/19 1230  BP: (!) 133/95 (!) 149/75  Pulse: 64 60  Resp: 18 14  Temp:    SpO2: 100% 100%    Last Pain:  Vitals:   01/18/19 1230  TempSrc:   PainSc: Cornelius

## 2019-01-18 NOTE — H&P (Addendum)
PREOPERATIVE H&P  Chief Complaint: bilateral carpal tunnel syndrome  HPI: Alisha Reed is a 58 y.o. female who presents for surgical treatment of bilateral carpal tunnel syndrome.  She denies any changes in medical history.  Past Medical History:  Diagnosis Date  . Arthritis    hands  . Carpal tunnel syndrome on both sides   . GERD (gastroesophageal reflux disease)   . Hypercholesterolemia   . Hypothyroidism   . Osteopenia 11/08/2018  . Thyroid disease    Past Surgical History:  Procedure Laterality Date  . ABDOMINAL HYSTERECTOMY    . cholecystectomy    . FOOT SURGERY Bilateral   . HYSTERECTOMY ABDOMINAL WITH SALPINGECTOMY    . NASAL SINUS SURGERY     Social History   Socioeconomic History  . Marital status: Married    Spouse name: Not on file  . Number of children: 2  . Years of education: 62  . Highest education level: Not on file  Occupational History  . Occupation: Therapist, sports  Social Needs  . Financial resource strain: Not on file  . Food insecurity    Worry: Not on file    Inability: Not on file  . Transportation needs    Medical: Not on file    Non-medical: Not on file  Tobacco Use  . Smoking status: Never Smoker  . Smokeless tobacco: Never Used  Substance and Sexual Activity  . Alcohol use: Yes    Comment: social  . Drug use: No  . Sexual activity: Yes    Birth control/protection: Surgical  Lifestyle  . Physical activity    Days per week: Not on file    Minutes per session: Not on file  . Stress: Not on file  Relationships  . Social Herbalist on phone: Not on file    Gets together: Not on file    Attends religious service: Not on file    Active member of club or organization: Not on file    Attends meetings of clubs or organizations: Not on file    Relationship status: Not on file  Other Topics Concern  . Not on file  Social History Narrative   Fun/Hobby - Concerts, camping, grandchildren   Family History  Problem Relation Age of  Onset  . Scleroderma Mother   . Rheum arthritis Mother   . Heart disease Mother   . Heart disease Father   . Glaucoma Father   . Stomach cancer Maternal Grandmother   . Heart attack Maternal Grandfather   . Alzheimer's disease Paternal Grandmother   . Throat cancer Paternal Grandfather   . Colon cancer Neg Hx   . Liver cancer Neg Hx    No Known Allergies Prior to Admission medications   Medication Sig Start Date End Date Taking? Authorizing Provider  calcium carbonate (OS-CAL - DOSED IN MG OF ELEMENTAL CALCIUM) 1250 (500 Ca) MG tablet Take 1 tablet by mouth.   Yes [provider]  celecoxib (CELEBREX) 200 MG capsule Take 1 capsule (200 mg total) by mouth 2 (two) times daily. 12/26/18  Yes Leandrew Koyanagi, MD  Cholecalciferol (VITAMIN D) 50 MCG (2000 UT) CAPS Take by mouth.   Yes [provider]  cyanocobalamin (,VITAMIN B-12,) 1000 MCG/ML injection 1 cc IM every month 12/30/18  Yes Biagio Borg, MD  estradiol (ESTRACE) 0.5 MG tablet  05/27/18  Yes [provider]  famotidine (PEPCID) 20 MG tablet Take 20 mg by mouth 2 (two) times daily.  Yes [provider]  levothyroxine (SYNTHROID) 75 MCG tablet Take 1 tablet (75 mcg total) by mouth daily before breakfast. 11/08/18  Yes Biagio Borg, MD  loratadine (CLARITIN) 10 MG tablet Take 10 mg by mouth daily.   Yes [provider]  rosuvastatin (CRESTOR) 40 MG tablet Take 1 tablet (40 mg total) by mouth daily. 11/09/18  Yes Biagio Borg, MD     Positive ROS: All other systems have been reviewed and were otherwise negative with the exception of those mentioned in the HPI and as above.  Physical Exam: General: Alert, no acute distress Cardiovascular: No pedal edema Respiratory: No cyanosis, no use of accessory musculature GI: abdomen soft Skin: No lesions in the area of chief complaint Neurologic: Sensation intact distally Psychiatric: Patient is competent for consent with normal mood and affect  Lymphatic: no lymphedema  MUSCULOSKELETAL: exam stable  Assessment: bilateral carpal tunnel syndrome  Plan: Plan for Procedure(s): BILATERAL CARPAL TUNNEL RELEASE  The risks benefits and alternatives were discussed with the patient including but not limited to the risks of nonoperative treatment, versus surgical intervention including infection, bleeding, nerve injury,  blood clots, cardiopulmonary complications, morbidity, mortality, among others, and they were willing to proceed.    Eduard Roux, MD   01/18/2019 7:46 AM

## 2019-01-18 NOTE — Discharge Instructions (Signed)
Post Anesthesia Home Care Instructions  Activity: Get plenty of rest for the remainder of the day. A responsible individual must stay with you for 24 hours following the procedure.  For the next 24 hours, DO NOT: -Drive a car -Paediatric nurse -Drink alcoholic beverages -Take any medication unless instructed by your physician -Make any legal decisions or sign important papers.  Meals: Start with liquid foods such as gelatin or soup. Progress to regular foods as tolerated. Avoid greasy, spicy, heavy foods. If nausea and/or vomiting occur, drink only clear liquids until the nausea and/or vomiting subsides. Call your physician if vomiting continues.  Special Instructions/Symptoms: Your throat may feel dry or sore from the anesthesia or the breathing tube placed in your throat during surgery. If this causes discomfort, gargle with warm salt water. The discomfort should disappear within 24 hours.  If you had a scopolamine patch placed behind your ear for the management of post- operative nausea and/or vomiting:  1. The medication in the patch is effective for 72 hours, after which it should be removed.  Wrap patch in a tissue and discard in the trash. Wash hands thoroughly with soap and water. 2. You may remove the patch earlier than 72 hours if you experience unpleasant side effects which may include dry mouth, dizziness or visual disturbances. 3. Avoid touching the patch. Wash your hands with soap and water after contact with the patch.         Postoperative instructions:  Weightbearing instructions: no lifting more than 10 lbs.  Dressing instructions: Keep your dressing and/or splint clean and dry at all times.  It will be removed at your first post-operative appointment.  Your stitches and/or staples will be removed at this visit.  Incision instructions:  Do not soak your incision for 3 weeks after surgery.  If the incision gets wet, pat dry and do not scrub the incision.  Pain  control:  You have been given a prescription to be taken as directed for post-operative pain control.  In addition, elevate the operative extremity above the heart at all times to prevent swelling and throbbing pain.  Take over-the-counter Colace, 100mg  by mouth twice a day while taking narcotic pain medications to help prevent constipation.  Follow up appointments: 1) 7 days for wound check. 2) Dr. Erlinda Hong as scheduled.   -------------------------------------------------------------------------------------------------------------  After Surgery Pain Control:  After your surgery, post-surgical discomfort or pain is likely. This discomfort can last several days to a few weeks. At certain times of the day your discomfort may be more intense.  Did you receive a nerve block?  A nerve block can provide pain relief for one hour to two days after your surgery. As long as the nerve block is working, you will experience little or no sensation in the area the surgeon operated on.  As the nerve block wears off, you will begin to experience pain or discomfort. It is very important that you begin taking your prescribed pain medication before the nerve block fully wears off. Treating your pain at the first sign of the block wearing off will ensure your pain is better controlled and more tolerable when full-sensation returns. Do not wait until the pain is intolerable, as the medicine will be less effective. It is better to treat pain in advance than to try and catch up.  General Anesthesia:  If you did not receive a nerve block during your surgery, you will need to start taking your pain medication shortly after your surgery and  should continue to do so as prescribed by your surgeon.  Pain Medication:  Most commonly we prescribe Vicodin and Percocet for post-operative pain. Both of these medications contain a combination of acetaminophen (Tylenol) and a narcotic to help control pain.   It takes between 30 and 45  minutes before pain medication starts to work. It is important to take your medication before your pain level gets too intense.   Nausea is a common side effect of many pain medications. You will want to eat something before taking your pain medicine to help prevent nausea.   If you are taking a prescription pain medication that contains acetaminophen, we recommend that you do not take additional over the counter acetaminophen (Tylenol).  Other pain relieving options:   Using a cold pack to ice the affected area a few times a day (15 to 20 minutes at a time) can help to relieve pain, reduce swelling and bruising.   Elevation of the affected area can also help to reduce pain and swelling.

## 2019-01-19 ENCOUNTER — Encounter (HOSPITAL_BASED_OUTPATIENT_CLINIC_OR_DEPARTMENT_OTHER): Payer: Self-pay | Admitting: Orthopaedic Surgery

## 2019-01-23 MED FILL — CELECOXIB 200 MG CAP: 200 | 30 days supply | Qty: 60 | Fill #1

## 2019-01-25 ENCOUNTER — Ambulatory Visit: Payer: 59

## 2019-01-25 ENCOUNTER — Other Ambulatory Visit: Payer: Self-pay

## 2019-01-25 ENCOUNTER — Encounter: Payer: Self-pay | Admitting: Orthopaedic Surgery

## 2019-01-25 ENCOUNTER — Ambulatory Visit (INDEPENDENT_AMBULATORY_CARE_PROVIDER_SITE_OTHER): Payer: 59 | Admitting: Physician Assistant

## 2019-01-25 VITALS — Ht 63.0 in | Wt 160.0 lb

## 2019-01-25 DIAGNOSIS — Z9889 Other specified postprocedural states: Secondary | ICD-10-CM | POA: Diagnosis not present

## 2019-01-25 NOTE — Progress Notes (Signed)
Post-Op Visit Note   Patient: Alisha Reed           Date of Birth: 08/31/1960           MRN: [id number] Visit Date: 01/25/2019 PCP: Biagio Borg, MD   Assessment & Plan:  Chief Complaint:  Chief Complaint  Patient presents with  . Left Hand - Routine Post Op    01/18/2019 [facility]  . Right Hand - Routine Post Op    01/18/2019 [facility]   Visit Diagnoses:  1. S/p bilateral carpal tunnel release     Plan: [name] is a pleasant 58 year old female who presents our clinic today 1 week status post bilateral carpal tunnel release, date of surgery 01/18/2019.  She has been doing well.  No fevers or chills.  She has noticed some burning, but no numbness.  Examination of both wrists reveal well-healing surgical incisions with nylon sutures in place.  No evidence of infection or cellulitis.  Today, the wounds were cleaned.  Band-Aids were applied and bilateral removal wrist splints provided.  At this point, she will avoid any heavy lifting or submerging her wounds in water until she is 4 weeks out from surgery.  She will follow-up with [place] in 1 week's time for suture removal.  Call with concerns or questions in the meantime.  Follow-Up Instructions: Return in about 1 week (around 02/01/2019).   Orders:  No orders of the defined types were placed in this encounter.  No orders of the defined types were placed in this encounter.   Imaging: No new imaging  PMFS History: Patient Active Problem List   Diagnosis Date Noted  . Chronic pain of right knee 12/29/2018  . Right carpal tunnel syndrome 12/29/2018  . Left carpal tunnel syndrome 12/29/2018  . Osteopenia 11/08/2018  . Pre-diabetes 05/02/2018  . Acute pain of right shoulder 03/18/2018  . Hyperlipidemia 11/04/2017  . Vitamin D deficiency 11/04/2017  . S/P cholecystectomy 08/02/2017  . Skier's thumb, right, subsequent encounter 03/15/2017  . Chronic left shoulder pain 02/08/2017  . Nondisplaced fracture of greater tuberosity of left  humerus, initial encounter for closed fracture 01/18/2017  . Preventative health care 09/24/2016  . Hypothyroidism 09/24/2016   Past Medical History:  Diagnosis Date  . Arthritis    hands  . Carpal tunnel syndrome on both sides   . GERD (gastroesophageal reflux disease)   . Hypercholesterolemia   . Hypothyroidism   . Osteopenia 11/08/2018  . Thyroid disease     Family History  Problem Relation Age of Onset  . Scleroderma Mother   . Rheum arthritis Mother   . Heart disease Mother   . Heart disease Father   . Glaucoma Father   . Stomach cancer Maternal Grandmother   . Heart attack Maternal Grandfather   . Alzheimer's disease Paternal Grandmother   . Throat cancer Paternal Grandfather   . Colon cancer Neg Hx   . Liver cancer Neg Hx     Past Surgical History:  Procedure Laterality Date  . ABDOMINAL HYSTERECTOMY    . BILATERAL CARPAL TUNNEL RELEASE Bilateral 01/18/2019   Procedure: BILATERAL CARPAL TUNNEL RELEASE;  Surgeon: Leandrew Koyanagi, MD;  Location: [facility];  Service: Orthopedics;  Laterality: Bilateral;  . cholecystectomy    . FOOT SURGERY Bilateral   . HYSTERECTOMY ABDOMINAL WITH SALPINGECTOMY    . NASAL SINUS SURGERY     Social History   Occupational History  . Occupation: Therapist, sports  Tobacco Use  . Smoking  status: Never Smoker  . Smokeless tobacco: Never Used  Substance and Sexual Activity  . Alcohol use: Yes    Comment: social  . Drug use: No  . Sexual activity: Yes    Birth control/protection: Surgical

## 2019-02-01 ENCOUNTER — Other Ambulatory Visit: Payer: Self-pay

## 2019-02-01 ENCOUNTER — Ambulatory Visit (INDEPENDENT_AMBULATORY_CARE_PROVIDER_SITE_OTHER): Payer: 59 | Admitting: Orthopaedic Surgery

## 2019-02-01 DIAGNOSIS — B360 Pityriasis versicolor: Secondary | ICD-10-CM | POA: Diagnosis not present

## 2019-02-01 DIAGNOSIS — D2371 Other benign neoplasm of skin of right lower limb, including hip: Secondary | ICD-10-CM | POA: Diagnosis not present

## 2019-02-01 DIAGNOSIS — Z9889 Other specified postprocedural states: Secondary | ICD-10-CM

## 2019-02-01 DIAGNOSIS — Z23 Encounter for immunization: Secondary | ICD-10-CM | POA: Diagnosis not present

## 2019-02-01 DIAGNOSIS — Z808 Family history of malignant neoplasm of other organs or systems: Secondary | ICD-10-CM | POA: Diagnosis not present

## 2019-02-01 DIAGNOSIS — D2372 Other benign neoplasm of skin of left lower limb, including hip: Secondary | ICD-10-CM | POA: Diagnosis not present

## 2019-02-01 MED ORDER — MUPIROCIN 2 % EX OINT: 1.0000 "application " | TOPICAL_OINTMENT | Freq: Two times a day (BID) | CUTANEOUS | 0 refills | Status: DC

## 2019-02-01 MED FILL — KETOCONAZOLE 2 % SHAM: 2 | 90 days supply | Qty: 120 | Fill #0

## 2019-02-01 MED FILL — ROSUVASTATIN CALCIUM 20 MG: 20 | 90 days supply | Qty: 90 | Fill #1

## 2019-02-01 MED FILL — MUPIROCIN 2% OINTMENT: 2 | 14 days supply | Qty: 22 | Fill #0

## 2019-02-01 NOTE — Progress Notes (Signed)
   Post-Op Visit Note   Patient: Alisha Reed           Date of Birth: 1961-02-25           MRN: [id number] Visit Date: 02/01/2019 PCP: Biagio Borg, MD   Assessment & Plan:  Visit Diagnoses:  1. S/p bilateral carpal tunnel release     Plan: [name] is two-week status post bilateral carpal tunnel release.  She is doing well.  Reports no pain.  She is here to have her sutures removed.  The incisions have healed without any signs of infection.  We remove them today and place Steri-Strips.  She may return back to work on Monday on light duty.  Work note provided today.  Recheck in 4 weeks.  Mupirocin was sent into the pharmacy for the incision.  Follow-Up Instructions: Return in about 4 weeks (around 03/01/2019).   Orders:  No orders of the defined types were placed in this encounter.  Meds ordered this encounter  Medications  . mupirocin ointment (BACTROBAN) 2 %    Sig: Apply 1 application topically 2 (two) times daily.    Dispense:  22 g    Refill:  0    Imaging: No results found.  PMFS History: Patient Active Problem List   Diagnosis Date Noted  . Chronic pain of right knee 12/29/2018  . Right carpal tunnel syndrome 12/29/2018  . Left carpal tunnel syndrome 12/29/2018  . Osteopenia 11/08/2018  . Pre-diabetes 05/02/2018  . Acute pain of right shoulder 03/18/2018  . Hyperlipidemia 11/04/2017  . Vitamin D deficiency 11/04/2017  . S/P cholecystectomy 08/02/2017  . Skier's thumb, right, subsequent encounter 03/15/2017  . Chronic left shoulder pain 02/08/2017  . Nondisplaced fracture of greater tuberosity of left humerus, initial encounter for closed fracture 01/18/2017  . Preventative health care 09/24/2016  . Hypothyroidism 09/24/2016   Past Medical History:  Diagnosis Date  . Arthritis    hands  . Carpal tunnel syndrome on both sides   . GERD (gastroesophageal reflux disease)   . Hypercholesterolemia   . Hypothyroidism   . Osteopenia 11/08/2018  . Thyroid  disease     Family History  Problem Relation Age of Onset  . Scleroderma Mother   . Rheum arthritis Mother   . Heart disease Mother   . Heart disease Father   . Glaucoma Father   . Stomach cancer Maternal Grandmother   . Heart attack Maternal Grandfather   . Alzheimer's disease Paternal Grandmother   . Throat cancer Paternal Grandfather   . Colon cancer Neg Hx   . Liver cancer Neg Hx     Past Surgical History:  Procedure Laterality Date  . ABDOMINAL HYSTERECTOMY    . BILATERAL CARPAL TUNNEL RELEASE Bilateral 01/18/2019   Procedure: BILATERAL CARPAL TUNNEL RELEASE;  Surgeon: Leandrew Koyanagi, MD;  Location: [facility];  Service: Orthopedics;  Laterality: Bilateral;  . cholecystectomy    . FOOT SURGERY Bilateral   . HYSTERECTOMY ABDOMINAL WITH SALPINGECTOMY    . NASAL SINUS SURGERY     Social History   Occupational History  . Occupation: Therapist, sports  Tobacco Use  . Smoking status: Never Smoker  . Smokeless tobacco: Never Used  Substance and Sexual Activity  . Alcohol use: Yes    Comment: social  . Drug use: No  . Sexual activity: Yes    Birth control/protection: Surgical

## 2019-02-20 MED FILL — CELECOXIB 200 MG CAP: 200 | 30 days supply | Qty: 60 | Fill #2

## 2019-02-20 MED FILL — ESTRADIOL 0.5 MG TABS: 0.5 | 90 days supply | Qty: 90 | Fill #0

## 2019-02-20 MED FILL — LEVOTHYROXINE 75 MCG TABLET: 75 | 90 days supply | Qty: 90 | Fill #1

## 2019-02-28 ENCOUNTER — Ambulatory Visit: Payer: 59 | Admitting: Orthopaedic Surgery

## 2019-03-01 ENCOUNTER — Encounter: Payer: Self-pay | Admitting: Orthopaedic Surgery

## 2019-03-01 ENCOUNTER — Other Ambulatory Visit: Payer: Self-pay

## 2019-03-01 ENCOUNTER — Ambulatory Visit (INDEPENDENT_AMBULATORY_CARE_PROVIDER_SITE_OTHER): Payer: 59 | Admitting: Orthopaedic Surgery

## 2019-03-01 DIAGNOSIS — G8929 Other chronic pain: Secondary | ICD-10-CM

## 2019-03-01 DIAGNOSIS — M25561 Pain in right knee: Secondary | ICD-10-CM

## 2019-03-01 DIAGNOSIS — Z9889 Other specified postprocedural states: Secondary | ICD-10-CM

## 2019-03-01 MED ORDER — PREDNISONE 10 MG (21) PO TBPK: ORAL_TABLET | ORAL | 0 refills | Status: DC

## 2019-03-01 MED FILL — predniSONE 10 MG TABS: 10 | 6 days supply | Qty: 21 | Fill #0

## 2019-03-01 NOTE — Progress Notes (Signed)
Office Visit Note   Patient: Alisha Reed           Date of Birth: May 10, 1961           MRN: [id number] Visit Date: 03/01/2019              Requested by: Biagio Borg, MD [address],  [place] [address] PCP: Biagio Borg, MD   Assessment & Plan: Visit Diagnoses:  1. S/p bilateral carpal tunnel release   2. Chronic pain of right knee     Plan: Impression is status post bilateral carpal tunnel release and right knee degenerative medial meniscus tear.  In regards to the wrist, I feel these will improve with time.  We will allow her to return to work full duty without restrictions at this point.  In regards to the right knee, we will go ahead and obtain an MRI to further assess for structural abnormalities.  She will follow-up with [place] once that has been completed.  Call with concerns or questions in the meantime.  Follow-Up Instructions: Return for after MRI.   Orders:  No orders of the defined types were placed in this encounter.  Meds ordered this encounter  Medications  . predniSONE (STERAPRED UNI-PAK 21 TAB) 10 MG (21) TBPK tablet    Sig: Take as directed    Dispense:  21 tablet    Refill:  0      Procedures: No procedures performed   Clinical Data: No additional findings.   Subjective: Chief Complaint  Patient presents with  . Right Hand - Pain, Follow-up  . Left Hand - Pain, Follow-up    HPI Alisha Reed is a pleasant 58 year old female who presents our clinic today 6 weeks status post bilateral carpal tunnel release, date of surgery 01/18/2019.  She was doing fairly well until about a week ago when she noticed increased pain to both wrists left greater than right.  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  She also notes that she has been wearing the brace up until about a week ago.  No fevers or chills.  No other thing she mentions today is her right knee.  History of right knee questionable  degenerative medial meniscus tear from a little over 2 months ago.  She was seen in our office where the right knee was injected with cortisone.  She did not notice much relief of symptoms following this injection.  She has had continued pain to the medial aspect.  There are no specific aggravators.  No mechanical symptoms.  No previous MRI.  Review of Systems as detailed in HPI.  All others reviewed and are negative.   Objective: Vital Signs: There were no vitals taken for this visit.  Physical Exam well-developed well-nourished female no acute distress.  Alert and oriented x3.  Ortho Exam examination of both wrists reveal fully healed surgical scars without evidence of infection or cellulitis.  Full sensation distally.  Examination of the right knee shows range of motion from 0 to120 degrees.  Marked tenderness medial joint line.  She is neurovascular intact distally.  Specialty Comments:  No specialty comments available.  Imaging: No new imaging   PMFS History: Patient Active Problem List   Diagnosis Date Noted  . Chronic pain of right knee 12/29/2018  . Right carpal tunnel syndrome 12/29/2018  . Left carpal tunnel syndrome 12/29/2018  . Osteopenia 11/08/2018  .  Pre-diabetes 05/02/2018  . Acute pain of right shoulder 03/18/2018  . Hyperlipidemia 11/04/2017  . Vitamin D deficiency 11/04/2017  . S/P cholecystectomy 08/02/2017  . Skier's thumb, right, subsequent encounter 03/15/2017  . Chronic left shoulder pain 02/08/2017  . Nondisplaced fracture of greater tuberosity of left humerus, initial encounter for closed fracture 01/18/2017  . Preventative health care 09/24/2016  . Hypothyroidism 09/24/2016   Past Medical History:  Diagnosis Date  . Arthritis    hands  . Carpal tunnel syndrome on both sides   . GERD (gastroesophageal reflux disease)   . Hypercholesterolemia   . Hypothyroidism   . Osteopenia 11/08/2018  . Thyroid disease     Family History  Problem Relation  Age of Onset  . Scleroderma Mother   . Rheum arthritis Mother   . Heart disease Mother   . Heart disease Father   . Glaucoma Father   . Stomach cancer Maternal Grandmother   . Heart attack Maternal Grandfather   . Alzheimer's disease Paternal Grandmother   . Throat cancer Paternal Grandfather   . Colon cancer Neg Hx   . Liver cancer Neg Hx     Past Surgical History:  Procedure Laterality Date  . ABDOMINAL HYSTERECTOMY    . BILATERAL CARPAL TUNNEL RELEASE Bilateral 01/18/2019   Procedure: BILATERAL CARPAL TUNNEL RELEASE;  Surgeon: Leandrew Koyanagi, MD;  Location: [facility];  Service: Orthopedics;  Laterality: Bilateral;  . cholecystectomy    . FOOT SURGERY Bilateral   . HYSTERECTOMY ABDOMINAL WITH SALPINGECTOMY    . NASAL SINUS SURGERY     Social History   Occupational History  . Occupation: Therapist, sports  Tobacco Use  . Smoking status: Never Smoker  . Smokeless tobacco: Never Used  Substance and Sexual Activity  . Alcohol use: Yes    Comment: social  . Drug use: No  . Sexual activity: Yes    Birth control/protection: Surgical

## 2019-03-09 DIAGNOSIS — Z01419 Encounter for gynecological examination (general) (routine) without abnormal findings: Secondary | ICD-10-CM | POA: Diagnosis not present

## 2019-03-09 DIAGNOSIS — N951 Menopausal and female climacteric states: Secondary | ICD-10-CM | POA: Diagnosis not present

## 2019-03-09 DIAGNOSIS — Z1231 Encounter for screening mammogram for malignant neoplasm of breast: Secondary | ICD-10-CM | POA: Diagnosis not present

## 2019-03-15 ENCOUNTER — Other Ambulatory Visit: Payer: Self-pay | Admitting: Orthopaedic Surgery

## 2019-03-15 MED ORDER — GABAPENTIN 100 MG PO CAPS: 100.0000 mg | ORAL_CAPSULE | Freq: Three times a day (TID) | ORAL | 3 refills | Status: DC

## 2019-03-16 MED FILL — GABAPENTIN 100 MG CAPSULE: 100 | 10 days supply | Qty: 30 | Fill #0

## 2019-03-24 ENCOUNTER — Other Ambulatory Visit: Payer: Self-pay

## 2019-03-24 ENCOUNTER — Ambulatory Visit
Admission: RE | Admit: 2019-03-24 | Discharge: 2019-03-24 | Disposition: A | Discharge status: Home / Self Care | Payer: 59 | Source: Ambulatory Visit | Attending: Physician Assistant | Admitting: Physician Assistant

## 2019-03-24 DIAGNOSIS — M25561 Pain in right knee: Secondary | ICD-10-CM

## 2019-03-24 DIAGNOSIS — G8929 Other chronic pain: Secondary | ICD-10-CM

## 2019-03-24 DIAGNOSIS — M1711 Unilateral primary osteoarthritis, right knee: Secondary | ICD-10-CM | POA: Diagnosis not present

## 2019-03-27 MED FILL — CYANOCOBALAMIN 1,000 MCG/ML: 1000 | 90 days supply | Qty: 3 | Fill #1

## 2019-03-27 MED FILL — CELECOXIB 200 MG CAP: 200 | 30 days supply | Qty: 60 | Fill #3

## 2019-03-27 MED FILL — GABAPENTIN 100 MG CAPSULE: 100 | 10 days supply | Qty: 30 | Fill #1

## 2019-03-27 NOTE — Progress Notes (Signed)
F/u to discuss

## 2019-03-28 ENCOUNTER — Other Ambulatory Visit: Payer: Self-pay

## 2019-03-28 ENCOUNTER — Other Ambulatory Visit: Payer: Self-pay | Admitting: Internal Medicine

## 2019-03-28 ENCOUNTER — Ambulatory Visit: Payer: 59 | Admitting: Orthopaedic Surgery

## 2019-03-28 DIAGNOSIS — Z9889 Other specified postprocedural states: Secondary | ICD-10-CM

## 2019-03-28 DIAGNOSIS — G8929 Other chronic pain: Secondary | ICD-10-CM

## 2019-03-28 DIAGNOSIS — M25561 Pain in right knee: Secondary | ICD-10-CM | POA: Diagnosis not present

## 2019-03-28 MED ORDER — LIDOCAINE HCL 1 % IJ SOLN
1.0000 mL | INTRAMUSCULAR | Status: AC | PRN
  Administered 2019-03-28: 1 mL

## 2019-03-28 MED ORDER — BUPIVACAINE HCL 0.5 % IJ SOLN
1.0000 mL | INTRAMUSCULAR | Status: AC | PRN
  Administered 2019-03-28: 1 mL

## 2019-03-28 MED ORDER — METHYLPREDNISOLONE ACETATE 40 MG/ML IJ SUSP
40.0000 mg | INTRAMUSCULAR | Status: AC | PRN
  Administered 2019-03-28: 40 mg

## 2019-03-28 NOTE — Progress Notes (Signed)
Office Visit Note   Patient: Alisha Reed           Date of Birth: [birth year]           MRN: [id number] Visit Date: 03/28/2019              Requested by: Biagio Borg, MD [address] [place],  [place] [address] PCP: Biagio Borg, MD   Assessment & Plan: Visit Diagnoses:  1. S/p bilateral carpal tunnel release   2. Chronic pain of right knee     Plan: For the hand symptoms bilateral carpal tunnel injections were performed which she tolerated well.  We had a long discussion about activity limitations and restrictions at work.  She will make all efforts to rest her hands.  MRI of the right knee shows no meniscal pathology.  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  She does not have any mechanical symptoms and overall this has been improving anyways therefore we will continue to just treat this symptomatically.  Questions encouraged and answered.  Follow-up as needed.  Follow-Up Instructions: Return if symptoms worsen or fail to improve.   Orders:  Orders Placed This Encounter  Procedures  . Hand/UE Inj: bilateral carpal tunnel   No orders of the defined types were placed in this encounter.     Procedures: Hand/UE Inj: bilateral carpal tunnel for carpal tunnel syndrome on 03/28/2019 2:20 PM Indications: pain Details: 25 G needle Medications (Right): 1 mL lidocaine 1 %; 1 mL bupivacaine 0.5 %; 40 mg methylPREDNISolone acetate 40 MG/ML Medications (Left): 1 mL lidocaine 1 %; 1 mL bupivacaine 0.5 %; 40 mg methylPREDNISolone acetate 40 MG/ML Outcome: tolerated well, no immediate complications Patient was prepped and draped in the usual sterile fashion.       Clinical Data: No additional findings.   Subjective: Chief Complaint  Patient presents with  . Right Knee - Follow-up    Alisha Reed comes in today for follow-up of bilateral carpal tunnel syndrome and review of right knee MRI.  She states that the right knee is feeling  slightly better.  She continues to have medial knee pain.  Denies any mechanical symptoms.  Denies any injuries.  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  Denies any constitutional symptoms.   Review of Systems  Constitutional: Negative.   HENT: Negative.   Eyes: Negative.   Respiratory: Negative.   Cardiovascular: Negative.   Endocrine: Negative.   Musculoskeletal: Negative.   Neurological: Negative.   Hematological: Negative.   Psychiatric/Behavioral: Negative.   All other systems reviewed and are negative.    Objective: Vital Signs: There were no vitals taken for this visit.  Physical Exam Vitals signs and nursing note reviewed.  Constitutional:      Appearance: She is well-developed.  Pulmonary:     Effort: Pulmonary effort is normal.  Skin:    General: Skin is warm.     Capillary Refill: Capillary refill takes less than 2 seconds.  Neurological:     Mental Status: She is alert and oriented to person, place, and time.  Psychiatric:        Behavior: Behavior normal.        Thought Content: Thought content normal.  Judgment: Judgment normal.     Ortho Exam Bilateral hand exams fully healed surgical scars.  She does have tenderness each scar.  The antebrachial region is mildly swollen and tender to touch.  There is no evidence of infection.  She has not endorsing any recurrence of her carpal tunnel symptoms.  Right knee exam shows no joint effusion.  She localizes the pain to the medial side near the medial epicondyle.  No joint line tenderness.  Full range of motion.  Collaterals and cruciates are stable. Specialty Comments:  No specialty comments available.  Imaging: No results found.   PMFS History: Patient Active Problem List   Diagnosis Date Noted  .  S/p bilateral carpal tunnel release 03/28/2019  . Chronic pain of right knee 12/29/2018  . Right carpal tunnel syndrome 12/29/2018  . Left carpal tunnel syndrome 12/29/2018  . Osteopenia 11/08/2018  . Pre-diabetes 05/02/2018  . Acute pain of right shoulder 03/18/2018  . Hyperlipidemia 11/04/2017  . Vitamin D deficiency 11/04/2017  . S/P cholecystectomy 08/02/2017  . Skier's thumb, right, subsequent encounter 03/15/2017  . Chronic left shoulder pain 02/08/2017  . Nondisplaced fracture of greater tuberosity of left humerus, initial encounter for closed fracture 01/18/2017  . Preventative health care 09/24/2016  . Hypothyroidism 09/24/2016   Past Medical History:  Diagnosis Date  . Arthritis    hands  . Carpal tunnel syndrome on both sides   . GERD (gastroesophageal reflux disease)   . Hypercholesterolemia   . Hypothyroidism   . Osteopenia 11/08/2018  . Thyroid disease     Family History  Problem Relation Age of Onset  . Scleroderma Mother   . Rheum arthritis Mother   . Heart disease Mother   . Heart disease Father   . Glaucoma Father   . Stomach cancer Maternal Grandmother   . Heart attack Maternal Grandfather   . Alzheimer's disease Paternal Grandmother   . Throat cancer Paternal Grandfather   . Colon cancer Neg Hx   . Liver cancer Neg Hx     Past Surgical History:  Procedure Laterality Date  . ABDOMINAL HYSTERECTOMY    . BILATERAL CARPAL TUNNEL RELEASE Bilateral 01/18/2019   Procedure: BILATERAL CARPAL TUNNEL RELEASE;  Surgeon: Leandrew Koyanagi, MD;  Location: [facility];  Service: Orthopedics;  Laterality: Bilateral;  . cholecystectomy    . FOOT SURGERY Bilateral   . HYSTERECTOMY ABDOMINAL WITH SALPINGECTOMY    . NASAL SINUS SURGERY     Social History   Occupational History  . Occupation: Therapist, sports  Tobacco Use  . Smoking status: Never Smoker  . Smokeless tobacco: Never Used  Substance and Sexual Activity  . Alcohol use: Yes    Comment: social  . Drug  use: No  . Sexual activity: Yes    Birth control/protection: Surgical

## 2019-03-29 NOTE — Addendum Note (Signed)
Addended by: Precious Bard on: 03/29/2019 09:21 AM   Modules accepted: Orders

## 2019-04-12 ENCOUNTER — Other Ambulatory Visit: Payer: Self-pay

## 2019-04-12 ENCOUNTER — Ambulatory Visit: Payer: 59 | Admitting: Physical Therapy

## 2019-04-12 ENCOUNTER — Encounter: Payer: Self-pay | Admitting: Physical Therapy

## 2019-04-12 DIAGNOSIS — M6281 Muscle weakness (generalized): Secondary | ICD-10-CM | POA: Diagnosis not present

## 2019-04-12 DIAGNOSIS — M25531 Pain in right wrist: Secondary | ICD-10-CM

## 2019-04-12 DIAGNOSIS — M25532 Pain in left wrist: Secondary | ICD-10-CM

## 2019-04-12 NOTE — Patient Instructions (Addendum)
Access Code: [email or web address]  URL: [email or web address]  Date: 04/12/2019  Prepared by: Elsie Ra   Exercises  Seated Carpal Tunnel Tissue Massage - 3-5 reps - 60 hold - 2x daily - 6x weekly  Wrist Tendon Gliding - 5 reps - 2x daily - 6x weekly  Median nerve/carpal tunnel sliders - 5 reps - 2x daily - 6x weekly  Seated Wrist Prayer Stretch - 3 reps - 20-30 sec hold - 2x daily - 6x weekly  Seated Reverse Wrist Prayer Stretch - 2-3 reps - 15-20 sec hold - 2x daily - 6x weekly  Doorway Pec Stretch at 60 Degrees Abduction with Arm Straight - 3 sets - 30 hold - 2x daily - 6x weekly  Seated Gripping Towel - 20 reps - 2-3 sets - 3-5 sec hold - 2x daily - 6x weekly  Seated Wrist Flexion with Dumbbell - 10 reps - 1-3 sets - 2x daily - 6x weekly  Seated Wrist Extension with Dumbbell - 10 reps - 1-3 sets - 2x daily - 6x weekly  Seated Wrist Supination Pronation with Can - 10 reps - 1-3 sets - 2x daily - 6x weekly

## 2019-04-12 NOTE — Therapy (Signed)
[place], [place], [address] Phone: [phone]   Fax:  [phone]  Physical Therapy Evaluation  Patient Details  Name: Alisha Reed MRN: [id number] Date of Birth: 1961/02/10 Referring Provider (PT): Leandrew Koyanagi, MD   Encounter Date: 04/12/2019  PT End of Session - 04/12/19 1529    Visit Number  1    Number of Visits  12    Date for PT Re-Evaluation  05/24/19    Authorization Type  [employer]    PT Start Time  1355    PT Stop Time  1445    PT Time Calculation (min)  50 min    Activity Tolerance  Patient tolerated treatment well    Behavior During Therapy  [facility] for tasks assessed/performed       Past Medical History:  Diagnosis Date  . Arthritis    hands  . Carpal tunnel syndrome on both sides   . GERD (gastroesophageal reflux disease)   . Hypercholesterolemia   . Hypothyroidism   . Osteopenia 11/08/2018  . Thyroid disease     Past Surgical History:  Procedure Laterality Date  . ABDOMINAL HYSTERECTOMY    . BILATERAL CARPAL TUNNEL RELEASE Bilateral 01/18/2019   Procedure: BILATERAL CARPAL TUNNEL RELEASE;  Surgeon: Leandrew Koyanagi, MD;  Location: [facility];  Service: Orthopedics;  Laterality: Bilateral;  . cholecystectomy    . FOOT SURGERY Bilateral   . HYSTERECTOMY ABDOMINAL WITH SALPINGECTOMY    . NASAL SINUS SURGERY      There were no vitals filed for this visit.   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 Had bilat carpal tunel injections 03/28/19 and this helped alot. Denies N/T in her hands but has trouble with  opening things and holding anything, overhead movements.    Pertinent History  PMH: chronic shoulder pain, chronic knee pain, CTS,    Limitations  House hold activities;Lifting;Other (comment)   work duties   Patient Stated Goals  improve scar and soft tissue restricitons and reduce paon    Currently in Pain?  Yes    Pain Score  7    in Left hand, only 4 in Rt hand   Pain Location  Hand    Pain Orientation  Right;Left    Pain Descriptors / Indicators  Aching;Throbbing;Tightness    Pain Type  Surgical pain    Pain Radiating Towards  ulnar side of her elbow, sometimes into her thumb    Pain Onset  More than a month ago    Pain Frequency  Intermittent         [facility] PT Assessment - 04/12/19 0001      Assessment   Medical Diagnosis  S/P bilat carpal tunnel release 01/18/2019    Referring Provider (PT)  Leandrew Koyanagi, MD    Onset Date/Surgical Date  01/18/19    Hand Dominance  Right    Next MD Visit  --   nothing scheduled but sees her MD in OR at work   Prior Therapy  has had PT  in past for shoulder      Restrictions   Weight Bearing Restrictions  No      Balance Screen   Has the patient fallen in the past 6 months  No      [place] residence      Prior Function   Level of Independence  Independent      Cognition   Overall Cognitive Status  Within Functional Limits for tasks assessed      Observation/Other Assessments   Observations  mild redness over scar, more inflammation and tightness around Lt hand vs Rt      Sensation   Light Touch  Appears Intact      Coordination   Gross Motor Movements are Fluid and Coordinated  Yes      ROM / Strength   AROM / PROM / Strength  AROM;Strength      AROM   AROM Assessment Site  Wrist    Right/Left Wrist  Right    Right Wrist Extension  50 Degrees    Right Wrist Flexion  55 Degrees    Right Wrist Radial Deviation  --   WNL   Right Wrist Ulnar Deviation  --   WNL   Left Wrist  Extension  65 Degrees    Left Wrist Flexion  50 Degrees    Left Wrist Radial Deviation  --   WNL   Left Wrist Ulnar Deviation  --   WNL     Strength   Overall Strength Comments  bilat strength overall 4+/5 for wrist all planes, 4+/5 grip strength, 5/5 elbow strength      Flexibility   Soft Tissue Assessment /Muscle Length  --   tightness in wrist flexors and extensors bilat Lt>Rt     Palpation   Palpation comment  TTP over scar and in her palm      Transfers   Transfers  Independent with all Transfers                Objective measurements completed on examination: See above findings.      [facility] Adult PT Treatment/Exercise - 04/12/19 0001      Exercises   Exercises  Hand      Hand Exercises   Other Hand Exercises  wrist prayer stretch 20 seconds X 1 rep, reverse prayer stretch 10 sec X 2 reps      Manual Therapy   Manual therapy comments  STM, IASTM, scar massage to bilat palms, anterior wrist, and wrist flexors             PT Education - 04/12/19 1528    Education Details  HEP, POC, self care for scar massage and STM    Person(s) Educated  Patient    Methods  Explanation;Demonstration;Verbal cues;Handout    Comprehension  Verbalized understanding;Need further instruction          PT Long Term Goals - 04/12/19 1547      PT LONG TERM GOAL #1   Title  Pt will be I and compliant with HEP. (target for all goals 6 weeks 05/23/18)    Baseline  no HEP until today    Time  6    Period  Weeks    Status  New      PT LONG TERM GOAL #2   Title  Pt will improve wrist ROM to [facility].    Baseline  50-60 deg    Time  6  Period  Weeks    Status  New      PT LONG TERM GOAL #3   Title  Pt will improve wrist and grip strength to 5/5 MMT bilat    Baseline  4+    Time  6    Period  Weeks    Status  New      PT LONG TERM GOAL #4   Title  Pt will be able to perform ususal ADL's, open jars, carry items, and work activites as OR nurse with less than 3/10  overall pain.    Baseline  7/10 pain on avg with these things    Time  6    Period  Weeks    Status  New             Plan - 04/12/19 1532    Clinical Impression Statement  Pt presents with bilat wrist pain S/P bilat carpal tunnel release 01/18/2019. She works as an Haematologist and has pain and difficulty with this as well as opening jars, grasping, carrying, overhead activities. She has overall increasd scar tissue and soft tissue restrictions in her palm and wrist flexors. She has decreased wrist and hand ROM, decreased strength, and increased pain limiting her functional and work abillities. She will benefit from skillled PT to address her deficits.    Personal Factors and Comorbidities  Comorbidity 2    Comorbidities  PMH: chronic shoulder pain, chronic knee pain, CTS,    Examination-Activity Limitations  Carry;Lift;Reach Overhead    Examination-Participation Restrictions  Meal Prep;Cleaning;Driving;Laundry;Other   work   Stability/Clinical Decision Making  Stable/Uncomplicated    Clinical Decision Making  Low    Rehab Potential  Good    PT Frequency  2x / week   1-2   PT Duration  6 weeks   4-6   PT Treatment/Interventions  ADLs/Self Care Home Management;Cryotherapy;Electrical Stimulation;Iontophoresis 4mg /ml Dexamethasone;Moist Heat;Ultrasound;Therapeutic activities;Therapeutic exercise;Neuromuscular re-education;Orthotic Fit/Training;Manual techniques;Passive range of motion;Scar mobilization;Taping;Dry needling    PT Next Visit Plan  needs STM/IASTM, scar mobilizations and stretching, consider [place] or modalaties for pain, has tried DN for her shoulder and said it hurt too bad    PT Home Exercise Plan  Access Code: [email or web address]       Patient will benefit from skilled therapeutic intervention in order to improve the following deficits and impairments:  Decreased activity tolerance, Decreased mobility, Decreased range of motion, Decreased scar mobility, Decreased strength, Increased  fascial restricitons, Increased muscle spasms, Impaired flexibility, Pain  Visit Diagnosis: Pain in left wrist  Pain in right wrist  Muscle weakness (generalized)     Problem List Patient Active Problem List   Diagnosis Date Noted  . S/p bilateral carpal tunnel release 03/28/2019  . Chronic pain of right knee 12/29/2018  . Right carpal tunnel syndrome 12/29/2018  . Left carpal tunnel syndrome 12/29/2018  . Osteopenia 11/08/2018  . Pre-diabetes 05/02/2018  . Acute pain of right shoulder 03/18/2018  . Hyperlipidemia 11/04/2017  . Vitamin D deficiency 11/04/2017  . S/P cholecystectomy 08/02/2017  . Skier's thumb, right, subsequent encounter 03/15/2017  . Chronic left shoulder pain 02/08/2017  . Nondisplaced fracture of greater tuberosity of left humerus, initial encounter for closed fracture 01/18/2017  . Preventative health care 09/24/2016  . Hypothyroidism 09/24/2016    Silvestre Mesi 04/12/2019, 4:12 PM  [facility] Physical Therapy [address] [place], [place], [address] Phone: [phone]   Fax:  [phone]  Name: Alisha Reed MRN: [id number] Date of Birth: February 27, 1961

## 2019-04-27 ENCOUNTER — Encounter: Payer: 59 | Admitting: Physical Therapy

## 2019-05-01 ENCOUNTER — Encounter: Payer: 59 | Admitting: Physical Therapy

## 2019-05-01 MED FILL — CELECOXIB 200 MG CAP: 200 | 30 days supply | Qty: 60 | Fill #4

## 2019-05-02 MED FILL — ROSUVASTATIN CALCIUM 40 MG: 40 | 90 days supply | Qty: 90 | Fill #0

## 2019-05-03 ENCOUNTER — Ambulatory Visit: Payer: 59 | Admitting: Physical Therapy

## 2019-05-03 ENCOUNTER — Other Ambulatory Visit: Payer: Self-pay

## 2019-05-03 ENCOUNTER — Encounter: Payer: Self-pay | Admitting: Physical Therapy

## 2019-05-03 DIAGNOSIS — M25512 Pain in left shoulder: Secondary | ICD-10-CM

## 2019-05-03 DIAGNOSIS — M25641 Stiffness of right hand, not elsewhere classified: Secondary | ICD-10-CM | POA: Diagnosis not present

## 2019-05-03 DIAGNOSIS — M6281 Muscle weakness (generalized): Secondary | ICD-10-CM

## 2019-05-03 DIAGNOSIS — M25531 Pain in right wrist: Secondary | ICD-10-CM | POA: Diagnosis not present

## 2019-05-03 DIAGNOSIS — M25532 Pain in left wrist: Secondary | ICD-10-CM | POA: Diagnosis not present

## 2019-05-03 DIAGNOSIS — M25541 Pain in joints of right hand: Secondary | ICD-10-CM | POA: Diagnosis not present

## 2019-05-03 NOTE — Therapy (Addendum)
[place], [place], [address] Phone: [phone]   Fax:  [phone]  Physical Therapy Treatment/Discharge Summary  Patient Details  Name: Airiana Elman MRN: [id number] Date of Birth: [birth year] Referring Provider (PT): Leandrew Koyanagi, MD   Encounter Date: 05/03/2019  PT End of Session - 05/03/19 1011    Visit Number  2    Number of Visits  12    Date for PT Re-Evaluation  05/24/19    Authorization Type  [employer]    PT Start Time  0925    PT Stop Time  1003    PT Time Calculation (min)  38 min    Activity Tolerance  Patient tolerated treatment well    Behavior During Therapy  [facility] for tasks assessed/performed       Past Medical History:  Diagnosis Date  . Arthritis    hands  . Carpal tunnel syndrome on both sides   . GERD (gastroesophageal reflux disease)   . Hypercholesterolemia   . Hypothyroidism   . Osteopenia 11/08/2018  . Thyroid disease     Past Surgical History:  Procedure Laterality Date  . ABDOMINAL HYSTERECTOMY    . BILATERAL CARPAL TUNNEL RELEASE Bilateral 01/18/2019   Procedure: BILATERAL CARPAL TUNNEL RELEASE;  Surgeon: Leandrew Koyanagi, MD;  Location: [facility];  Service: Orthopedics;  Laterality: Bilateral;  . cholecystectomy    . FOOT SURGERY Bilateral   . HYSTERECTOMY ABDOMINAL WITH SALPINGECTOMY    . NASAL SINUS SURGERY      There were no vitals filed for this visit.  Subjective Assessment - 05/03/19 0929    Subjective  exercises are going well; hands feel like they are better.  decreased shooting pains.  fell on Lt hand last week which really hurt, but thinks that may have helped break up the scar tissue.    Pertinent History  PMH: chronic shoulder pain, chronic knee pain, CTS,    Limitations  House hold activities;Lifting;Other (comment)   work duties   Patient Stated Goals  improve scar and soft tissue restricitons and reduce paon    Currently in Pain?  No/denies    Pain  Onset  More than a month ago                       [facility] Adult PT Treatment/Exercise - 05/03/19 0933      Exercises   Exercises  Wrist      Wrist Exercises   Other wrist exercises  prayer/reverse prayer stretch 3x30 sec bil    Other wrist exercises  wrist tendon gliders/sliders x 5 reps bil      Modalities   Modalities  Ultrasound      Ultrasound   Ultrasound Location  bil wrist    Ultrasound Parameters  50% DC, 1.0 w/cm2, 60mz freq x 5 min each hand    Ultrasound Goals  Other (Comment)   scar tissue     Manual Therapy   Manual therapy comments  STM, IASTM, scar massage to bilat palms, anterior wrist, and wrist flexors                  PT Long Term Goals - 04/12/19 1547      PT LONG TERM GOAL #1   Title  Pt will be I and compliant with HEP. (target for all goals 6 weeks 05/23/18)    Baseline  no HEP until today    Time  6  Period  Weeks    Status  New      PT LONG TERM GOAL #2   Title  Pt will improve wrist ROM to [facility].    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    Baseline  7/10 pain on avg with these things    Time  6    Period  Weeks    Status  New            Plan - 05/03/19 1012    Clinical Impression Statement  Pt reports improved pain and symptoms overall, and recommended continued stretches.  [place] and IASTM today to work on scar tissue.  Pt wil continue to benefit from PT to maximize function.    Personal Factors and Comorbidities  Comorbidity 2    Comorbidities  PMH: chronic shoulder pain, chronic knee pain, CTS,    Examination-Activity Limitations  Carry;Lift;Reach Overhead     Examination-Participation Restrictions  Meal Prep;Cleaning;Driving;Laundry;Other   work   Stability/Clinical Decision Making  Stable/Uncomplicated    Rehab Potential  Good    PT Frequency  2x / week   1-2   PT Duration  6 weeks   4-6   PT Treatment/Interventions  ADLs/Self Care Home Management;Cryotherapy;Electrical Stimulation;Iontophoresis 88m/ml Dexamethasone;Moist Heat;Ultrasound;Therapeutic activities;Therapeutic exercise;Neuromuscular re-education;Orthotic Fit/Training;Manual techniques;Passive range of motion;Scar mobilization;Taping;Dry needling    PT Next Visit Plan  needs STM/IASTM, scar mobilizations and stretching, assess response to DN    PT Home Exercise Plan  Access Code: [email or web address]       Patient will benefit from skilled therapeutic intervention in order to improve the following deficits and impairments:  Decreased activity tolerance, Decreased mobility, Decreased range of motion, Decreased scar mobility, Decreased strength, Increased fascial restricitons, Increased muscle spasms, Impaired flexibility, Pain  Visit Diagnosis: Pain in left wrist  Pain in right wrist  Muscle weakness (generalized)  Acute pain of left shoulder  Stiffness of right hand, not elsewhere classified  Pain in joints of right hand     Problem List Patient Active Problem List   Diagnosis Date Noted  . S/p bilateral carpal tunnel release 03/28/2019  . Chronic pain of right knee 12/29/2018  . Right carpal tunnel syndrome 12/29/2018  . Left carpal tunnel syndrome 12/29/2018  . Osteopenia 11/08/2018  . Pre-diabetes 05/02/2018  . Acute pain of right shoulder 03/18/2018  . Hyperlipidemia 11/04/2017  . Vitamin D deficiency 11/04/2017  . S/P cholecystectomy 08/02/2017  . Skier's thumb, right, subsequent encounter 03/15/2017  . Chronic left shoulder pain 02/08/2017  . Nondisplaced fracture of greater tuberosity of left humerus, initial encounter for closed fracture 01/18/2017  . Preventative  health care 09/24/2016  . Hypothyroidism 09/24/2016        SLaureen Abrahams PT, DPT 05/03/19 10:13 AM     [facility] [address] [address]Phone: [phone]  Fax:  [phone] Name: CAliannah HolstromMRN:  [id number] Date of Birth: Jul 02, 1960      PHYSICAL THERAPY DISCHARGE SUMMARY  Visits from Start of Care: 2  Current functional level related to goals / functional outcomes: See above   Remaining deficits: See above   Education / Equipment: HEP  Plan: Patient agrees to discharge.  Patient goals were not met. Patient is being discharged due to being pleased with the current functional level.  ?????     Pt requested d/c - stated she was doing better.  Laureen Abrahams, PT, DPT 07/20/19 2:22 PM  [facility] [address] [place], [place], [address] Phone: [phone]   Fax:  [phone]

## 2019-05-08 ENCOUNTER — Encounter: Payer: 59 | Admitting: Physical Therapy

## 2019-05-10 ENCOUNTER — Encounter: Payer: 59 | Admitting: Physical Therapy

## 2019-05-15 MED FILL — ROSUVASTATIN CALCIUM 20 MG: 20 | 90 days supply | Qty: 90 | Fill #0

## 2019-05-25 MED FILL — LEVOTHYROXINE SODIUM 75 MCG: 75 | 90 days supply | Qty: 90 | Fill #0

## 2019-06-05 MED FILL — CELECOXIB 200 MG CAP: 200 | 30 days supply | Qty: 60 | Fill #5

## 2019-06-07 MED FILL — ESTRADIOL 0.5 MG TABLET: 0.5 | 90 days supply | Qty: 90 | Fill #0

## 2019-06-13 ENCOUNTER — Other Ambulatory Visit: Payer: Self-pay

## 2019-06-13 ENCOUNTER — Ambulatory Visit (INDEPENDENT_AMBULATORY_CARE_PROVIDER_SITE_OTHER): Payer: 59 | Admitting: Orthopaedic Surgery

## 2019-06-13 DIAGNOSIS — M25561 Pain in right knee: Secondary | ICD-10-CM

## 2019-06-13 DIAGNOSIS — G8929 Other chronic pain: Secondary | ICD-10-CM

## 2019-06-13 MED ORDER — METHYLPREDNISOLONE ACETATE 40 MG/ML IJ SUSP
40.0000 mg | INTRAMUSCULAR | Status: AC | PRN
  Administered 2019-06-13: 40 mg via INTRA_ARTICULAR

## 2019-06-13 MED ORDER — BUPIVACAINE HCL 0.5 % IJ SOLN
2.0000 mL | INTRAMUSCULAR | Status: AC | PRN
  Administered 2019-06-13: 2 mL via INTRA_ARTICULAR

## 2019-06-13 MED ORDER — LIDOCAINE HCL 1 % IJ SOLN
2.0000 mL | INTRAMUSCULAR | Status: AC | PRN
  Administered 2019-06-13: 14:00:00 2 mL

## 2019-06-13 NOTE — Progress Notes (Signed)
Office Visit Note   Patient: Alisha Reed           Date of Birth: November 02, 1960           MRN: [id number] Visit Date: 06/13/2019              Requested by: Biagio Borg, MD [address] [place],  [place] [address] PCP: Biagio Borg, MD   Assessment & Plan: Visit Diagnoses:  1. Chronic pain of right knee     Plan: Impression is right knee pain.  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  Based on our discussion of findings we have agreed to proceed with a cortisone injection to see what can her relief she gets.  She will let me know over the next couple weeks if she does not feel any improvement.  Follow-Up Instructions: Return if symptoms worsen or fail to improve.   Orders:  No orders of the defined types were placed in this encounter.  No orders of the defined types were placed in this encounter.     Procedures: Large Joint Inj: R knee on 06/13/2019 2:14 PM Indications: pain Details: 22 G needle  Arthrogram: No  Medications: 40 mg methylPREDNISolone acetate 40 MG/ML; 2 mL lidocaine 1 %; 2 mL bupivacaine 0.5 % Consent was given by the patient. Patient was prepped and draped in the usual sterile fashion.       Clinical Data: No additional findings.   Subjective: No chief complaint on file.   Alisha Reed comes in today for recent increase in right knee pain.  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  The medial meniscus was intact.  She states that she has sharp shooting pain on the medial side of the knee without any locking or effusion.   Review of Systems   Objective: Vital Signs: There were no vitals taken for this visit.  Physical Exam  Ortho Exam Right knee exam shows no joint effusion.  She has exquisite medial  joint line tenderness.  Collaterals and cruciates are stable.  Full range of motion without pain or locking. Specialty Comments:  No specialty comments available.  Imaging: No results found.   PMFS History: Patient Active Problem List   Diagnosis Date Noted  . S/p bilateral carpal tunnel release 03/28/2019  . Chronic pain of right knee 12/29/2018  . Right carpal tunnel syndrome 12/29/2018  . Left carpal tunnel syndrome 12/29/2018  . Osteopenia 11/08/2018  . Pre-diabetes 05/02/2018  . Acute pain of right shoulder 03/18/2018  . Hyperlipidemia 11/04/2017  . Vitamin D deficiency 11/04/2017  . S/P cholecystectomy 08/02/2017  . Skier's thumb, right, subsequent encounter 03/15/2017  . Chronic left shoulder pain 02/08/2017  . Nondisplaced fracture of greater tuberosity of left humerus, initial encounter for closed fracture 01/18/2017  . Preventative health care 09/24/2016  . Hypothyroidism 09/24/2016   Past Medical History:  Diagnosis Date  . Arthritis    hands  . Carpal tunnel syndrome on both sides   . GERD (gastroesophageal reflux disease)   . Hypercholesterolemia   . Hypothyroidism   . Osteopenia 11/08/2018  . Thyroid disease     Family History  Problem Relation Age of Onset  . Scleroderma Mother   . Rheum arthritis Mother   .  Heart disease Mother   . Heart disease Father   . Glaucoma Father   . Stomach cancer Maternal Grandmother   . Heart attack Maternal Grandfather   . Alzheimer's disease Paternal Grandmother   . Throat cancer Paternal Grandfather   . Colon cancer Neg Hx   . Liver cancer Neg Hx     Past Surgical History:  Procedure Laterality Date  . ABDOMINAL HYSTERECTOMY    . BILATERAL CARPAL TUNNEL RELEASE Bilateral 01/18/2019   Procedure: BILATERAL CARPAL TUNNEL RELEASE;  Surgeon: Leandrew Koyanagi, MD;  Location: [facility];  Service: Orthopedics;  Laterality: Bilateral;  . cholecystectomy    . FOOT SURGERY Bilateral   . HYSTERECTOMY ABDOMINAL  WITH SALPINGECTOMY    . NASAL SINUS SURGERY     Social History   Occupational History  . Occupation: Therapist, sports  Tobacco Use  . Smoking status: Never Smoker  . Smokeless tobacco: Never Used  Substance and Sexual Activity  . Alcohol use: Yes    Comment: social  . Drug use: No  . Sexual activity: Yes    Birth control/protection: Surgical

## 2019-07-04 MED FILL — CYANOCOBALAMIN 1,000 MCG/ML: 1000 | 90 days supply | Qty: 3 | Fill #2

## 2019-07-04 MED FILL — CELECOXIB 200 MG CAP: 200 | 30 days supply | Qty: 60 | Fill #6

## 2019-07-18 ENCOUNTER — Telehealth: Payer: Self-pay

## 2019-07-18 ENCOUNTER — Other Ambulatory Visit: Payer: Self-pay

## 2019-07-18 ENCOUNTER — Encounter: Payer: Self-pay | Admitting: Orthopaedic Surgery

## 2019-07-18 DIAGNOSIS — G8929 Other chronic pain: Secondary | ICD-10-CM

## 2019-07-18 NOTE — Telephone Encounter (Signed)
Yes please. Thanks. 

## 2019-07-18 NOTE — Telephone Encounter (Signed)
MRI ORDER MADE

## 2019-07-21 ENCOUNTER — Encounter: Payer: Self-pay | Admitting: Orthopaedic Surgery

## 2019-07-24 ENCOUNTER — Other Ambulatory Visit: Payer: Self-pay | Admitting: Physician Assistant

## 2019-07-24 MED ORDER — TRAMADOL HCL 50 MG PO TABS: 50.0000 mg | ORAL_TABLET | Freq: Three times a day (TID) | ORAL | 2 refills | Status: DC | PRN

## 2019-07-24 MED FILL — traMADol HCL 50 MG TABS: 50 | 10 days supply | Qty: 30 | Fill #0

## 2019-07-24 NOTE — Telephone Encounter (Signed)
Sent in.  Ok to take with celebrex

## 2019-07-31 MED FILL — CELECOXIB 200 MG CAP: 200 | 30 days supply | Qty: 60 | Fill #7

## 2019-08-04 MED FILL — ROSUVASTATIN CALCIUM 20 MG: 20 | 90 days supply | Qty: 90 | Fill #0

## 2019-08-21 MED FILL — LEVOTHYROXINE SODIUM 75 MCG: 75 | 90 days supply | Qty: 90 | Fill #1

## 2019-08-23 ENCOUNTER — Other Ambulatory Visit: Payer: Self-pay

## 2019-08-23 MED ORDER — LEVOTHYROXINE SODIUM 75 MCG PO TABS: 75.0000 ug | ORAL_TABLET | Freq: Every day | ORAL | 1 refills | Status: DC

## 2019-08-25 ENCOUNTER — Other Ambulatory Visit: Payer: 59

## 2019-08-27 ENCOUNTER — Ambulatory Visit
Admission: RE | Admit: 2019-08-27 | Discharge: 2019-08-27 | Disposition: A | Discharge status: Home / Self Care | Payer: 59 | Source: Ambulatory Visit | Attending: Orthopaedic Surgery | Admitting: Orthopaedic Surgery

## 2019-08-27 DIAGNOSIS — M25561 Pain in right knee: Secondary | ICD-10-CM | POA: Diagnosis not present

## 2019-08-27 DIAGNOSIS — G8929 Other chronic pain: Secondary | ICD-10-CM

## 2019-08-29 ENCOUNTER — Encounter: Payer: Self-pay | Admitting: Internal Medicine

## 2019-08-29 MED FILL — CELECOXIB 200 MG CAP: 200 | 30 days supply | Qty: 60 | Fill #8

## 2019-08-30 ENCOUNTER — Ambulatory Visit (AMBULATORY_SURGERY_CENTER): Payer: Self-pay

## 2019-08-30 ENCOUNTER — Other Ambulatory Visit: Payer: Self-pay

## 2019-08-30 VITALS — Temp 97.5°F | Ht 63.0 in | Wt 163.0 lb

## 2019-08-30 DIAGNOSIS — Z1211 Encounter for screening for malignant neoplasm of colon: Secondary | ICD-10-CM

## 2019-08-30 MED ORDER — NA SULFATE-K SULFATE-MG SULF 17.5-3.13-1.6 GM/177ML PO SOLN: 1.0000 | Freq: Once | ORAL | 0 refills | Status: AC

## 2019-08-30 MED FILL — SUPREP BOWEL PREP KIT: 17.5-3.13-1 | 1 days supply | Qty: 354 | Fill #0

## 2019-08-30 NOTE — Progress Notes (Signed)
No egg or soy allergy known to patient  No issues with past sedation with any surgeries  or procedures, no intubation problems  No diet pills per patient No home 02 use per patient  No blood thinners per patient  Pt denies issues with constipation  No A fib or A flutter  EMMI video sent to pt's e mail   Pt has completed covid vaccine series.  Due to the COVID-19 pandemic we are asking patients to follow these guidelines. Please only bring one care partner. Please be aware that your care partner may wait in the car in the parking lot or if they feel like they will be too hot to wait in the car, they may wait in the lobby on the 4th floor. All care partners are required to wear a mask the entire time (we do not have any that we can provide them), they need to practice social distancing, and we will do a Covid check for all patient's and care partners when you arrive. Also we will check their temperature and your temperature. If the care partner waits in their car they need to stay in the parking lot the entire time and we will call them on their cell phone when the patient is ready for discharge so they can bring the car to the front of the building. Also all patient's will need to wear a mask into building.

## 2019-09-19 DIAGNOSIS — M1711 Unilateral primary osteoarthritis, right knee: Secondary | ICD-10-CM | POA: Diagnosis not present

## 2019-09-19 DIAGNOSIS — M25561 Pain in right knee: Secondary | ICD-10-CM | POA: Diagnosis not present

## 2019-09-21 ENCOUNTER — Encounter: Payer: Self-pay | Admitting: Gastroenterology

## 2019-09-27 ENCOUNTER — Ambulatory Visit (AMBULATORY_SURGERY_CENTER): Payer: 59 | Admitting: Gastroenterology

## 2019-09-27 ENCOUNTER — Other Ambulatory Visit: Payer: Self-pay

## 2019-09-27 ENCOUNTER — Encounter: Payer: Self-pay | Admitting: Gastroenterology

## 2019-09-27 VITALS — BP 131/70 | HR 78 | Temp 97.3°F | Resp 14 | Ht 63.0 in | Wt 163.0 lb

## 2019-09-27 DIAGNOSIS — D129 Benign neoplasm of anus and anal canal: Secondary | ICD-10-CM

## 2019-09-27 DIAGNOSIS — D128 Benign neoplasm of rectum: Secondary | ICD-10-CM

## 2019-09-27 DIAGNOSIS — Z1211 Encounter for screening for malignant neoplasm of colon: Secondary | ICD-10-CM | POA: Diagnosis not present

## 2019-09-27 DIAGNOSIS — D125 Benign neoplasm of sigmoid colon: Secondary | ICD-10-CM | POA: Diagnosis not present

## 2019-09-27 DIAGNOSIS — D127 Benign neoplasm of rectosigmoid junction: Secondary | ICD-10-CM | POA: Diagnosis not present

## 2019-09-27 MED ORDER — SODIUM CHLORIDE 0.9 % IV SOLN: 500.0000 mL | Freq: Once | INTRAVENOUS | Status: DC

## 2019-09-27 NOTE — Progress Notes (Signed)
pt tolerated well. VSS. awake and to recovery. Report given to RN.  

## 2019-09-27 NOTE — Patient Instructions (Signed)
Thank you for allowing [place] to care for you today!  Await pathology results by mail, approximately 2 weeks by mail.  Will make recommendation at that time for future colonoscopy.  Resume previous diet and medications today.  Return to your normal activities tomorrow.    YOU HAD AN ENDOSCOPIC PROCEDURE TODAY AT [facility]:   Refer to the procedure report that was given to you for any specific questions about what was found during the examination.  If the procedure report does not answer your questions, please call your gastroenterologist to clarify.  If you requested that your care partner not be given the details of your procedure findings, then the procedure report has been included in a sealed envelope for you to review at your convenience later.  YOU SHOULD EXPECT: Some feelings of bloating in the abdomen. Passage of more gas than usual.  Walking can help get rid of the air that was put into your GI tract during the procedure and reduce the bloating. If you had a lower endoscopy (such as a colonoscopy or flexible sigmoidoscopy) you may notice spotting of blood in your stool or on the toilet paper. If you underwent a bowel prep for your procedure, you may not have a normal bowel movement for a few days.  Please Note:  You might notice some irritation and congestion in your nose or some drainage.  This is from the oxygen used during your procedure.  There is no need for concern and it should clear up in a day or so.  SYMPTOMS TO REPORT IMMEDIATELY:   Following lower endoscopy (colonoscopy or flexible sigmoidoscopy):  Excessive amounts of blood in the stool  Significant tenderness or worsening of abdominal pains  Swelling of the abdomen that is new, acute  Fever of 100F or higher   For urgent or emergent issues, a gastroenterologist can be reached at any hour by calling [phone]. Do not use MyChart messaging for urgent concerns.    DIET:  We do recommend a small  meal at first, but then you may proceed to your regular diet.  Drink plenty of fluids but you should avoid alcoholic beverages for 24 hours.  ACTIVITY:  You should plan to take it easy for the rest of today and you should NOT DRIVE or use heavy machinery until tomorrow (because of the sedation medicines used during the test).    FOLLOW UP: Our staff will call the number listed on your records 48-72 hours following your procedure to check on you and address any questions or concerns that you may have regarding the information given to you following your procedure. If we do not reach you, we will leave a message.  We will attempt to reach you two times.  During this call, we will ask if you have developed any symptoms of COVID 19. If you develop any symptoms (ie: fever, flu-like symptoms, shortness of breath, cough etc.) before then, please call [phone].  If you test positive for Covid 19 in the 2 weeks post procedure, please call and report this information to [place].    If any biopsies were taken you will be contacted by phone or by letter within the next 1-3 weeks.  Please call us at [phone] if you have not heard about the biopsies in 3 weeks.    SIGNATURES/CONFIDENTIALITY: You and/or your care partner have signed paperwork which will be entered into your electronic medical record.  These signatures attest to the fact that that  the information above on your After Visit Summary has been reviewed and is understood.  Full responsibility of the confidentiality of this discharge information lies with you and/or your care-partner.

## 2019-09-27 NOTE — Progress Notes (Signed)
Pt's states no medical or surgical changes since previsit or office visit.  Temp- June Vitals- Donna 

## 2019-09-27 NOTE — Progress Notes (Signed)
Called to room to assist during endoscopic procedure.  Patient ID and intended procedure confirmed with present staff. Received instructions for my participation in the procedure from the performing physician.  

## 2019-09-27 NOTE — Op Note (Signed)
[facility] Patient Name: Alisha Reed Procedure Date: 09/27/2019 3:12 PM MRN: [id number] Endoscopist: Remo Lipps P. Havery Moros , MD Age: 59 Referring MD:  Date of Birth: 24-Feb-1961 Gender: Female Account #: [id number] Procedure:                Colonoscopy Indications:              Screening for colorectal malignant neoplasm Medicines:                Monitored Anesthesia Care Procedure:                Pre-Anesthesia Assessment:                           - Prior to the procedure, a History and Physical                            was performed, and patient medications and                            allergies were reviewed. The patient's tolerance of                            previous anesthesia was also reviewed. The risks                            and benefits of the procedure and the sedation                            options and risks were discussed with the patient.                            All questions were answered, and informed consent                            was obtained. Prior Anticoagulants: The patient has                            taken no previous anticoagulant or antiplatelet                            agents. ASA Grade Assessment: II - A patient with                            mild systemic disease. After reviewing the risks                            and benefits, the patient was deemed in                            satisfactory condition to undergo the procedure.                           After obtaining informed consent, the colonoscope  was passed under direct vision. Throughout the                            procedure, the patient's blood pressure, pulse, and                            oxygen saturations were monitored continuously. The                            Colonoscope was introduced through the anus and                            advanced to the the terminal ileum, with                            identification of the  appendiceal orifice and IC                            valve. The colonoscopy was performed without                            difficulty. The patient tolerated the procedure                            well. The quality of the bowel preparation was                            good. The ileocecal valve, appendiceal orifice, and                            rectum were photographed. Scope In: 3:19:46 PM Scope Out: 3:39:49 PM Scope Withdrawal Time: 0 hours 16 minutes 53 seconds  Total Procedure Duration: 0 hours 20 minutes 3 seconds  Findings:                 The perianal and digital rectal examinations were                            normal.                           The terminal ileum appeared normal.                           A 4 mm polyp was found in the sigmoid colon. The                            polyp was sessile. The polyp was removed with a                            cold snare. Resection and retrieval were complete.                           A 3 mm polyp was found in the rectum. The polyp was  sessile. The polyp was removed with a cold snare.                            Resection and retrieval were complete.                           A few small-mouthed diverticula were found in the                            sigmoid colon.                           The exam was otherwise without abnormality. Complications:            No immediate complications. Estimated blood loss:                            Minimal. Estimated Blood Loss:     Estimated blood loss was minimal. Impression:               - The examined portion of the ileum was normal.                           - One 4 mm polyp in the sigmoid colon, removed with                            a cold snare. Resected and retrieved.                           - One 3 mm polyp in the rectum, removed with a cold                            snare. Resected and retrieved.                           - Diverticulosis in the  sigmoid colon.                           - The examination was otherwise normal. Recommendation:           - Patient has a contact number available for                            emergencies. The signs and symptoms of potential                            delayed complications were discussed with the                            patient. Return to normal activities tomorrow.                            Written discharge instructions were provided to the  patient.                           - Resume previous diet.                           - Continue present medications.                           - Await pathology results. Remo Lipps P. Tayna Smethurst, MD 09/27/2019 3:43:32 PM This report has been signed electronically.

## 2019-09-29 ENCOUNTER — Encounter: Payer: Self-pay | Admitting: Orthopaedic Surgery

## 2019-09-29 ENCOUNTER — Telehealth: Payer: Self-pay

## 2019-09-29 NOTE — Telephone Encounter (Signed)
  Follow up Call-  Call back number 09/27/2019  Post procedure Call Back phone  # [phone]  Permission to leave phone message Yes  Some recent data might be hidden     Patient questions:  Do you have a fever, pain , or abdominal swelling? No. Pain Score  0 *  Have you tolerated food without any problems? Yes.    Have you been able to return to your normal activities? Yes.    Do you have any questions about your discharge instructions: Diet   No. Medications  No. Follow up visit  No.  Do you have questions or concerns about your Care? No.  Actions: * If pain score is 4 or above: No action needed, pain <4.  1. Have you developed a fever since your procedure? no  2.   Have you had an respiratory symptoms (SOB or cough) since your procedure? no  3.   Have you tested positive for COVID 19 since your procedure no  4.   Have you had any family members/close contacts diagnosed with the COVID 19 since your procedure?  no   If yes to any of these questions please route to Joylene John, RN and Erenest Rasher, RN

## 2019-10-02 MED FILL — CELECOXIB 200 MG CAP: 200 | 30 days supply | Qty: 60 | Fill #9

## 2019-10-09 ENCOUNTER — Telehealth: Payer: 59 | Admitting: Physician Assistant

## 2019-10-09 DIAGNOSIS — R059 Cough, unspecified: Secondary | ICD-10-CM

## 2019-10-09 DIAGNOSIS — R05 Cough: Secondary | ICD-10-CM | POA: Diagnosis not present

## 2019-10-09 DIAGNOSIS — J40 Bronchitis, not specified as acute or chronic: Secondary | ICD-10-CM | POA: Diagnosis not present

## 2019-10-09 MED ORDER — AZITHROMYCIN 250 MG PO TABS: ORAL_TABLET | ORAL | 0 refills | Status: DC

## 2019-10-09 MED ORDER — BENZONATATE 100 MG PO CAPS: 100.0000 mg | ORAL_CAPSULE | Freq: Three times a day (TID) | ORAL | 0 refills | Status: DC | PRN

## 2019-10-09 NOTE — Progress Notes (Signed)
We are sorry that you are not feeling well.  Here is how we plan to help!  Based on your presentation I believe you most likely have A cough due to bacteria.  When patients have a fever and a productive cough with a change in color or increased sputum production, we are concerned about bacterial bronchitis.  If left untreated it can progress to pneumonia.  If your symptoms do not improve with your treatment plan it is important that you contact your provider.   I have prescribed Azithromyin 250 mg: two tablets now and then one tablet daily for 4 additonal days    In addition you may use A prescription cough medication called Tessalon Perles 100mg. You may take 1-2 capsules every 8 hours as needed for your cough.   From your responses in the eVisit questionnaire you describe inflammation in the upper respiratory tract which is causing a significant cough.  This is commonly called Bronchitis and has four common causes:    Allergies  Viral Infections  Acid Reflux  Bacterial Infection Allergies, viruses and acid reflux are treated by controlling symptoms or eliminating the cause. An example might be a cough caused by taking certain blood pressure medications. You stop the cough by changing the medication. Another example might be a cough caused by acid reflux. Controlling the reflux helps control the cough.  USE OF BRONCHODILATOR ("RESCUE") INHALERS: There is a risk from using your bronchodilator too frequently.  The risk is that over-reliance on a medication which only relaxes the muscles surrounding the breathing tubes can reduce the effectiveness of medications prescribed to reduce swelling and congestion of the tubes themselves.  Although you feel brief relief from the bronchodilator inhaler, your asthma may actually be worsening with the tubes becoming more swollen and filled with mucus.  This can delay other crucial treatments, such as oral steroid medications. If you need to use a  bronchodilator inhaler daily, several times per day, you should discuss this with your provider.  There are probably better treatments that could be used to keep your asthma under control.     HOME CARE . Only take medications as instructed by your medical team. . Complete the entire course of an antibiotic. . Drink plenty of fluids and get plenty of rest. . Avoid close contacts especially the very young and the elderly . Cover your mouth if you cough or cough into your sleeve. . Always remember to wash your hands . A steam or ultrasonic humidifier can help congestion.   GET HELP RIGHT AWAY IF: . You develop worsening fever. . You become short of breath . You cough up blood. . Your symptoms persist after you have completed your treatment plan MAKE SURE YOU   Understand these instructions.  Will watch your condition.  Will get help right away if you are not doing well or get worse.  Your e-visit answers were reviewed by a board certified advanced clinical practitioner to complete your personal care plan.  Depending on the condition, your plan could have included both over the counter or prescription medications. If there is a problem please reply  once you have received a response from your provider. Your safety is important to us.  If you have drug allergies check your prescription carefully.    You can use MyChart to ask questions about today's visit, request a non-urgent call back, or ask for a work or school excuse for 24 hours related to this e-Visit. If it has been   greater than 24 hours you will need to follow up with your provider, or enter a new e-Visit to address those concerns. You will get an e-mail in the next two days asking about your experience.  I hope that your e-visit has been valuable and will speed your recovery. Thank you for using e-visits.  Greater than 5 minutes, yet less than 10 minutes of time have been spent researching, coordinating and implementing care for  this patient today.   

## 2019-10-27 ENCOUNTER — Telehealth: Payer: 59 | Admitting: Family

## 2019-10-27 ENCOUNTER — Other Ambulatory Visit: Payer: Self-pay | Admitting: Family

## 2019-10-27 DIAGNOSIS — K219 Gastro-esophageal reflux disease without esophagitis: Secondary | ICD-10-CM

## 2019-10-27 MED ORDER — OMEPRAZOLE 20 MG PO CPDR
20.0000 mg | DELAYED_RELEASE_CAPSULE | Freq: Every day | ORAL | 3 refills | Status: DC
Start: 2019-10-27 — End: 2019-10-27

## 2019-10-27 NOTE — Progress Notes (Signed)
We are sorry that you are not feeling well.  Here is how we plan to help!  Based on what you shared with me it looks like you most likely have Gastroesophageal Reflux Disease (GERD)  Gastroesophageal reflux disease (GERD) happens when acid from your stomach flows up into the esophagus.  When acid comes in contact with the esophagus, the acid causes sorenss (inflammation) in the esophagus.  Over time, GERD may create small holes (ulcers) in the lining of the esophagus.  I have prescribed Omeprazole 20 mg one by mouth daily until you follow up with a provider.  Your symptoms should improve in the next day or two.  You can use antacids as needed until symptoms resolve.  Call us if your heartburn worsens, you have trouble swallowing, weight loss, spitting up blood or recurrent vomiting.  Home Care:  May include lifestyle changes such as weight loss, quitting smoking and alcohol consumption  Avoid foods and drinks that make your symptoms worse, such as:  Caffeine or alcoholic drinks  Chocolate  Peppermint or mint flavorings  Garlic and onions  Spicy foods  Citrus fruits, such as oranges, lemons, or limes  Tomato-based foods such as sauce, chili, salsa and pizza  Fried and fatty foods  Avoid lying down for 3 hours prior to your bedtime or prior to taking a nap  Eat small, frequent meals instead of a large meals  Wear loose-fitting clothing.  Do not wear anything tight around your waist that causes pressure on your stomach.  Raise the head of your bed 6 to 8 inches with wood blocks to help you sleep.  Extra pillows will not help.  Seek Help Right Away If:  You have pain in your arms, neck, jaw, teeth or back  Your pain increases or changes in intensity or duration  You develop nausea, vomiting or sweating (diaphoresis)  You develop shortness of breath or you faint  Your vomit is green, yellow, black or looks like coffee grounds or blood  Your stool is red, bloody or  black  These symptoms could be signs of other problems, such as heart disease, gastric bleeding or esophageal bleeding.  Make sure you :  Understand these instructions.  Will watch your condition.  Will get help right away if you are not doing well or get worse.  Your e-visit answers were reviewed by a board certified advanced clinical practitioner to complete your personal care plan.  Depending on the condition, your plan could have included both over the counter or prescription medications.  If there is a problem please reply  once you have received a response from your provider.  Your safety is important to us.  If you have drug allergies check your prescription carefully.    You can use MyChart to ask questions about today's visit, request a non-urgent call back, or ask for a work or school excuse for 24 hours related to this e-Visit. If it has been greater than 24 hours you will need to follow up with your provider, or enter a new e-Visit to address those concerns.  You will get an e-mail in the next two days asking about your experience.  I hope that your e-visit has been valuable and will speed your recovery. Thank you for using e-visits.  Approximately 5 minutes was spent documenting and reviewing patient's chart.     

## 2019-10-30 MED FILL — ROSUVASTATIN CALCIUM 20 MG: 20 | 90 days supply | Qty: 90 | Fill #1

## 2019-11-01 ENCOUNTER — Other Ambulatory Visit: Payer: Self-pay | Admitting: Orthopaedic Surgery

## 2019-11-01 ENCOUNTER — Encounter: Payer: Self-pay | Admitting: Orthopaedic Surgery

## 2019-11-01 MED ORDER — TRAMADOL HCL 50 MG PO TABS: 50.0000 mg | ORAL_TABLET | Freq: Every day | ORAL | 0 refills | Status: DC | PRN

## 2019-11-01 MED FILL — traMADol HCL 50 MG TABS: 50 | 30 days supply | Qty: 30 | Fill #0

## 2019-11-06 MED FILL — CELECOXIB 200 MG CAP: 200 | 30 days supply | Qty: 60 | Fill #10

## 2019-11-08 ENCOUNTER — Other Ambulatory Visit (INDEPENDENT_AMBULATORY_CARE_PROVIDER_SITE_OTHER): Payer: 59

## 2019-11-08 DIAGNOSIS — Z0000 Encounter for general adult medical examination without abnormal findings: Secondary | ICD-10-CM | POA: Diagnosis not present

## 2019-11-08 DIAGNOSIS — R7303 Prediabetes: Secondary | ICD-10-CM | POA: Diagnosis not present

## 2019-11-08 LAB — URINALYSIS, ROUTINE W REFLEX MICROSCOPIC
Bilirubin Urine: NEGATIVE
Ketones, ur: NEGATIVE
Nitrite: NEGATIVE
Specific Gravity, Urine: 1.01 (ref 1.000–1.030)
Total Protein, Urine: NEGATIVE
Urine Glucose: NEGATIVE
Urobilinogen, UA: 0.2 (ref 0.0–1.0)
pH: 6.5 (ref 5.0–8.0)

## 2019-11-08 LAB — LIPID PANEL
Cholesterol: 135 mg/dL (ref 0–200)
HDL: 56.8 mg/dL (ref >39.00)
LDL Cholesterol: 64 mg/dL (ref 0–99)
NonHDL: 78.17
Total CHOL/HDL Ratio: 2
Triglycerides: 72 mg/dL (ref 0.0–149.0)
VLDL: 14.4 mg/dL (ref 0.0–40.0)

## 2019-11-08 LAB — BASIC METABOLIC PANEL
BUN: 16 mg/dL (ref 6–23)
CO2: 28 mEq/L (ref 19–32)
Calcium: 9.9 mg/dL (ref 8.4–10.5)
Chloride: 101 mEq/L (ref 96–112)
Creatinine, Ser: 0.79 mg/dL (ref 0.40–1.20)
GFR: 74.1 mL/min (ref >60.00)
Glucose, Bld: 101 mg/dL — ABNORMAL HIGH (ref 70–99)
Potassium: 4.6 mEq/L (ref 3.5–5.1)
Sodium: 136 mEq/L (ref 135–145)

## 2019-11-08 LAB — CBC WITH DIFFERENTIAL/PLATELET
Basophils Absolute: 0.1 10*3/uL (ref 0.0–0.1)
Basophils Relative: 0.8 % (ref 0.0–3.0)
Eosinophils Absolute: 0.6 10*3/uL (ref 0.0–0.7)
Eosinophils Relative: 8.5 % — ABNORMAL HIGH (ref 0.0–5.0)
HCT: 39.1 % (ref 36.0–46.0)
Hemoglobin: 13 g/dL (ref 12.0–15.0)
Lymphocytes Relative: 31 % (ref 12.0–46.0)
Lymphs Abs: 2.3 10*3/uL (ref 0.7–4.0)
MCHC: 33.3 g/dL (ref 30.0–36.0)
MCV: 91.7 fl (ref 78.0–100.0)
Monocytes Absolute: 0.6 10*3/uL (ref 0.1–1.0)
Monocytes Relative: 8.6 % (ref 3.0–12.0)
Neutro Abs: 3.7 10*3/uL (ref 1.4–7.7)
Neutrophils Relative %: 51.1 % (ref 43.0–77.0)
Platelets: 305 10*3/uL (ref 150.0–400.0)
RBC: 4.27 Mil/uL (ref 3.87–5.11)
RDW: 14 % (ref 11.5–15.5)
WBC: 7.3 10*3/uL (ref 4.0–10.5)

## 2019-11-08 LAB — HEPATIC FUNCTION PANEL
ALT: 18 U/L (ref 0–35)
AST: 20 U/L (ref 0–37)
Albumin: 4.3 g/dL (ref 3.5–5.2)
Alkaline Phosphatase: 52 U/L (ref 39–117)
Bilirubin, Direct: 0.1 mg/dL (ref 0.0–0.3)
Total Bilirubin: 0.4 mg/dL (ref 0.2–1.2)
Total Protein: 7 g/dL (ref 6.0–8.3)

## 2019-11-08 LAB — TSH: TSH: 0.52 u[IU]/mL (ref 0.35–4.50)

## 2019-11-08 LAB — HEMOGLOBIN A1C: Hgb A1c MFr Bld: 6.2 % (ref 4.6–6.5)

## 2019-11-09 ENCOUNTER — Encounter: Payer: Self-pay | Admitting: Internal Medicine

## 2019-11-09 ENCOUNTER — Other Ambulatory Visit: Payer: Self-pay

## 2019-11-09 ENCOUNTER — Ambulatory Visit (INDEPENDENT_AMBULATORY_CARE_PROVIDER_SITE_OTHER): Payer: 59 | Admitting: Internal Medicine

## 2019-11-09 VITALS — BP 120/86 | HR 94 | Temp 97.9°F | Ht 63.0 in | Wt 161.0 lb

## 2019-11-09 DIAGNOSIS — Z0000 Encounter for general adult medical examination without abnormal findings: Secondary | ICD-10-CM | POA: Diagnosis not present

## 2019-11-09 DIAGNOSIS — R7303 Prediabetes: Secondary | ICD-10-CM | POA: Diagnosis not present

## 2019-11-09 NOTE — Progress Notes (Addendum)
Subjective:    Patient ID: Alisha Reed, female    DOB: 25-May-1960, 59 y.o.   MRN: [id number]  HPI  Here for wellness and f/u;  Overall doing ok;  Pt denies Chest pain, worsening SOB, DOE, wheezing, orthopnea, PND, worsening LE edema, palpitations, dizziness or syncope.  Pt denies neurological change such as new headache, facial or extremity weakness.  Pt denies polydipsia, polyuria, or low sugar symptoms. Pt states overall good compliance with treatment and medications, good tolerability, and has been trying to follow appropriate diet.  Pt denies worsening depressive symptoms, suicidal ideation or panic. No fever, night sweats, wt loss, loss of appetite, or other constitutional symptoms.  Pt states good ability with ADL's, has low fall risk, home safety reviewed and adequate, no other significant changes in hearing or vision, and only occasionally active with exercise.  No new complaints Past Medical History:  Diagnosis Date  . Arthritis    hands,   . Carpal tunnel syndrome on both sides    both hands surgeries 01/2019  . GERD (gastroesophageal reflux disease)   . Hypercholesterolemia   . Hypothyroidism   . Osteopenia 11/08/2018  . Thyroid disease    Past Surgical History:  Procedure Laterality Date  . ABDOMINAL HYSTERECTOMY    . BILATERAL CARPAL TUNNEL RELEASE Bilateral 01/18/2019   Procedure: BILATERAL CARPAL TUNNEL RELEASE;  Surgeon: Leandrew Koyanagi, MD;  Location: [facility];  Service: Orthopedics;  Laterality: Bilateral;  . cholecystectomy    . CHOLECYSTECTOMY    . COLONOSCOPY  2011  . FOOT SURGERY Bilateral   . HYSTERECTOMY ABDOMINAL WITH SALPINGECTOMY    . NASAL SINUS SURGERY      reports that she has never smoked. She has never used smokeless tobacco. She reports current alcohol use. She reports that she does not use drugs. family history includes Alzheimer's disease in her paternal grandmother; Glaucoma in her father; Heart attack in her maternal grandfather; Heart  disease in her father and mother; Rheum arthritis in her mother; Scleroderma in her mother; Stomach cancer in her maternal grandmother; Throat cancer in her paternal grandfather. No Known Allergies Current Outpatient Medications on File Prior to Visit  Medication Sig Dispense Refill  . Biotin 1000 MCG CHEW Chew by mouth.    . calcium carbonate (OS-CAL - DOSED IN MG OF ELEMENTAL CALCIUM) 1250 (500 Ca) MG tablet Take 1 tablet by mouth.    . celecoxib (CELEBREX) 200 MG capsule Take 1 capsule (200 mg total) by mouth 2 (two) times daily. 60 capsule 12  . Cholecalciferol (VITAMIN D) 50 MCG (2000 UT) CAPS Take by mouth.    . cyanocobalamin 1000 MCG tablet Take 1,000 mcg by mouth daily.    Marland Kitchen estradiol (ESTRACE) 0.5 MG tablet     . levothyroxine (SYNTHROID) 75 MCG tablet Take 1 tablet (75 mcg total) by mouth daily before breakfast. 90 tablet 1  . omeprazole (PRILOSEC) 20 MG capsule Take 1 capsule (20 mg total) by mouth daily. 90 capsule 3  . rosuvastatin (CRESTOR) 40 MG tablet Take 1 tablet (40 mg total) by mouth daily. 90 tablet 3  . traMADol (ULTRAM) 50 MG tablet Take 1 tablet (50 mg total) by mouth daily as needed. 30 tablet 0  . Turmeric (QC TUMERIC COMPLEX) 500 MG CAPS Take 1 capsule by mouth.     No current facility-administered medications on file prior to visit.   Review of Systems All otherwise neg per pt     Objective:   Physical Exam  BP 120/86 (BP Location: Left Arm, Patient Position: Sitting, Cuff Size: Large)   Pulse 94   Temp 97.9 F (36.6 C) (Oral)   Ht 5\' 3"  (1.6 m)   Wt 161 lb (73 kg)   SpO2 97%   BMI 28.52 kg/m  VS noted,  Constitutional: Pt appears in NAD HENT: Head: NCAT.  Right Ear: External ear normal.  Left Ear: External ear normal.  Eyes: . Pupils are equal, round, and reactive to light. Conjunctivae and EOM are normal Nose: without d/c or deformity Neck: Neck supple. Gross normal ROM Cardiovascular: Normal rate and regular rhythm.   Pulmonary/Chest: Effort  normal and breath sounds without rales or wheezing.  Abd:  Soft, NT, ND, + BS, no organomegaly Neurological: Pt is alert. At baseline orientation, motor grossly intact Skin: Skin is warm. No rashes, other new lesions, no LE edema Psychiatric: Pt behavior is normal without agitation  All otherwise neg per pt Lab Results  Component Value Date   WBC 7.3 11/08/2019   HGB 13.0 11/08/2019   HCT 39.1 11/08/2019   PLT 305.0 11/08/2019   GLUCOSE 101 (H) 11/08/2019   CHOL 135 11/08/2019   TRIG 72.0 11/08/2019   HDL 56.80 11/08/2019   LDLCALC 64 11/08/2019   ALT 18 11/08/2019   AST 20 11/08/2019   NA 136 11/08/2019   K 4.6 11/08/2019   CL 101 11/08/2019   CREATININE 0.79 11/08/2019   BUN 16 11/08/2019   CO2 28 11/08/2019   TSH 0.52 11/08/2019   HGBA1C 6.2 11/08/2019      Assessment & Plan:

## 2019-11-09 NOTE — Patient Instructions (Signed)
Please continue all other medications as before, and refills have been done if requested.  Please have the pharmacy call with any other refills you may need.  Please continue your efforts at being more active, low cholesterol diet, and weight control.  You are otherwise up to date with prevention measures today.  Please keep your appointments with your specialists as you may have planned  Please make an Appointment to return for your 1 year visit, or sooner if needed, with Lab testing by Appointment as well, to be done about 3-5 days before at the FIRST FLOOR Lab (so this is for TWO appointments - please see the scheduling desk as you leave)    

## 2019-11-12 ENCOUNTER — Encounter: Payer: Self-pay | Admitting: Internal Medicine

## 2019-11-12 NOTE — Assessment & Plan Note (Signed)

## 2019-11-12 NOTE — Assessment & Plan Note (Signed)
stable overall by history and exam, recent data reviewed with pt, and pt to continue medical treatment as before,  to f/u any worsening symptoms or concerns  

## 2019-11-15 MED FILL — LEVOTHYROXINE SODIUM 75 MCG: 75 | 90 days supply | Qty: 90 | Fill #0

## 2019-12-08 MED FILL — CELECOXIB 200 MG CAP: 200 | 30 days supply | Qty: 60 | Fill #11

## 2019-12-08 MED FILL — ESTRADIOL 0.5 MG TABS: 0.5 | 90 days supply | Qty: 90 | Fill #2

## 2019-12-17 DIAGNOSIS — Z20828 Contact with and (suspected) exposure to other viral communicable diseases: Secondary | ICD-10-CM | POA: Diagnosis not present

## 2019-12-17 DIAGNOSIS — R112 Nausea with vomiting, unspecified: Secondary | ICD-10-CM | POA: Diagnosis not present

## 2019-12-17 DIAGNOSIS — R197 Diarrhea, unspecified: Secondary | ICD-10-CM | POA: Diagnosis not present

## 2020-01-02 ENCOUNTER — Other Ambulatory Visit: Payer: Self-pay | Admitting: Orthopaedic Surgery

## 2020-01-02 MED FILL — CELECOXIB 200 MG CAP: 200 | 30 days supply | Qty: 60 | Fill #0

## 2020-01-25 ENCOUNTER — Ambulatory Visit: Payer: 59 | Admitting: Orthopaedic Surgery

## 2020-01-30 ENCOUNTER — Ambulatory Visit: Payer: 59 | Admitting: Orthopaedic Surgery

## 2020-01-30 ENCOUNTER — Other Ambulatory Visit: Payer: Self-pay | Admitting: Internal Medicine

## 2020-01-30 ENCOUNTER — Telehealth: Payer: Self-pay

## 2020-01-30 VITALS — Ht 64.0 in | Wt 168.0 lb

## 2020-01-30 DIAGNOSIS — M1711 Unilateral primary osteoarthritis, right knee: Secondary | ICD-10-CM | POA: Diagnosis not present

## 2020-01-30 MED ORDER — LIDOCAINE HCL 1 % IJ SOLN
2.0000 mL | INTRAMUSCULAR | Status: AC | PRN
  Administered 2020-01-30: 2 mL

## 2020-01-30 MED ORDER — BUPIVACAINE HCL 0.5 % IJ SOLN
2.0000 mL | INTRAMUSCULAR | Status: AC | PRN
  Administered 2020-01-30: 2 mL via INTRA_ARTICULAR

## 2020-01-30 MED ORDER — METAXALONE 800 MG PO TABS
800.0000 mg | ORAL_TABLET | Freq: Three times a day (TID) | ORAL | 0 refills | Status: DC
Start: 2020-01-30 — End: 2020-11-15

## 2020-01-30 MED ORDER — METHYLPREDNISOLONE ACETATE 40 MG/ML IJ SUSP
40.0000 mg | INTRAMUSCULAR | Status: AC | PRN
  Administered 2020-01-30: 40 mg via INTRA_ARTICULAR

## 2020-01-30 MED FILL — CELECOXIB 200 MG CAP: 200 | 30 days supply | Qty: 60 | Fill #1

## 2020-01-30 MED FILL — METAXALONE 800 MG TABLET: 800 | 7 days supply | Qty: 20 | Fill #0

## 2020-01-30 NOTE — Telephone Encounter (Signed)
Sorry no refill as this was changed to the 40 mg

## 2020-01-30 NOTE — Telephone Encounter (Signed)
Please submit for right knee gel inj 

## 2020-01-30 NOTE — Telephone Encounter (Signed)
Noted  

## 2020-01-30 NOTE — Telephone Encounter (Signed)
Please refill as per office routine med refill policy (all routine meds refilled for 3 mo or monthly per pt preference up to one year from last visit, then month to month grace period for 3 mo, then further med refills will have to be denied)  

## 2020-01-30 NOTE — Progress Notes (Signed)
Office Visit Note   Patient: Alisha Reed           Date of Birth: March 14, 1961           MRN: [id number] Visit Date: 01/30/2020              Requested by: Biagio Borg, MD [address] [place],  [place] [address] PCP: Biagio Borg, MD   Assessment & Plan: Visit Diagnoses:  1. Primary osteoarthritis of right knee     Plan: Impression is right knee OA exacerbation.  Cortisone injection administered today.  We will also get approval for Visco injection.  We will also have Ryan adjust her medial unloader brace.  Skelaxin was sent in today.  Follow-up as needed.  Follow-Up Instructions: Return if symptoms worsen or fail to improve.   Orders:  No orders of the defined types were placed in this encounter.  Meds ordered this encounter  Medications  . metaxalone (SKELAXIN) 800 MG tablet    Sig: Take 1 tablet (800 mg total) by mouth 3 (three) times daily.    Dispense:  20 tablet    Refill:  0      Procedures: Large Joint Inj: R knee on 01/30/2020 11:50 AM Indications: pain Details: 22 G needle  Arthrogram: No  Medications: 40 mg methylPREDNISolone acetate 40 MG/ML; 2 mL lidocaine 1 %; 2 mL bupivacaine 0.5 % Consent was given by the patient. Patient was prepped and draped in the usual sterile fashion.       Clinical Data: No additional findings.   Subjective: Chief Complaint  Patient presents with  . Right Knee - Pain    Alisha Reed is coming in today for right knee pain.  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  She has had pain for the last week and a half.  Is worse with activity.  Is causing her ankle and right hip also to her.  She denies any mechanical symptoms.  The pain wakes her up at night.  Also complaining of some muscle spasms.   Review of Systems   Objective: Vital Signs: Ht 5\' 4"  (1.626 m)   Wt 168 lb (76.2 kg)   BMI 28.84 kg/m   Physical Exam  Ortho Exam Right knee shows no joint effusion.  Medial joint  line tenderness.  Full range of motion.  Because her cruciates are stable. Specialty Comments:  No specialty comments available.  Imaging: No results found.   PMFS History: Patient Active Problem List   Diagnosis Date Noted  . S/p bilateral carpal tunnel release 03/28/2019  . Chronic pain of right knee 12/29/2018  . Right carpal tunnel syndrome 12/29/2018  . Left carpal tunnel syndrome 12/29/2018  . Osteopenia 11/08/2018  . Pre-diabetes 05/02/2018  . Acute pain of right shoulder 03/18/2018  . Hyperlipidemia 11/04/2017  . Vitamin D deficiency 11/04/2017  . S/P cholecystectomy 08/02/2017  . Skier's thumb, right, subsequent encounter 03/15/2017  . Chronic left shoulder pain 02/08/2017  . Nondisplaced fracture of greater tuberosity of left humerus, initial encounter for closed fracture 01/18/2017  . Preventative health care 09/24/2016  . Hypothyroidism 09/24/2016   Past Medical History:  Diagnosis Date  . Arthritis    hands,   . Carpal tunnel syndrome on both sides    both hands surgeries 01/2019  . GERD (gastroesophageal reflux disease)   . Hypercholesterolemia   . Hypothyroidism   . Osteopenia 11/08/2018  . Thyroid disease     Family  History  Problem Relation Age of Onset  . Scleroderma Mother   . Rheum arthritis Mother   . Heart disease Mother   . Heart disease Father   . Glaucoma Father   . Stomach cancer Maternal Grandmother   . Heart attack Maternal Grandfather   . Alzheimer's disease Paternal Grandmother   . Throat cancer Paternal Grandfather   . Colon cancer Neg Hx   . Liver cancer Neg Hx   . Colon polyps Neg Hx   . Esophageal cancer Neg Hx   . Rectal cancer Neg Hx     Past Surgical History:  Procedure Laterality Date  . ABDOMINAL HYSTERECTOMY    . BILATERAL CARPAL TUNNEL RELEASE Bilateral 01/18/2019   Procedure: BILATERAL CARPAL TUNNEL RELEASE;  Surgeon: Leandrew Koyanagi, MD;  Location: [facility];  Service: Orthopedics;  Laterality: Bilateral;   . cholecystectomy    . CHOLECYSTECTOMY    . COLONOSCOPY  2011  . FOOT SURGERY Bilateral   . HYSTERECTOMY ABDOMINAL WITH SALPINGECTOMY    . NASAL SINUS SURGERY     Social History   Occupational History  . Occupation: Therapist, sports  Tobacco Use  . Smoking status: Never Smoker  . Smokeless tobacco: Never Used  Vaping Use  . Vaping Use: Never used  Substance and Sexual Activity  . Alcohol use: Yes    Comment: social  . Drug use: No  . Sexual activity: Yes    Birth control/protection: Surgical

## 2020-01-31 MED FILL — OMEPRAZOLE DR 20 MG CAPSULE: 20 | 90 days supply | Qty: 90 | Fill #0

## 2020-02-02 NOTE — Telephone Encounter (Signed)
Submitted for VOB for Monovisc-Right knee 

## 2020-02-02 NOTE — Telephone Encounter (Signed)
See message below. Thank you

## 2020-02-05 ENCOUNTER — Telehealth: Payer: Self-pay

## 2020-02-05 NOTE — Telephone Encounter (Signed)
Pts insurance covers Durolane so VOB was completed for Durolane instead of monovisc   Approved for Durolane-Right knee Dr. Frederik Pear and Bill [phone] copay then covered @ 100% No prior auth required   New Start-ok to schedule @ next available

## 2020-02-05 NOTE — Telephone Encounter (Signed)
Called pt and scheduled appointment. Pt is aware of copay 

## 2020-02-07 ENCOUNTER — Encounter: Payer: Self-pay | Admitting: Internal Medicine

## 2020-02-12 ENCOUNTER — Other Ambulatory Visit: Payer: Self-pay

## 2020-02-12 MED ORDER — ROSUVASTATIN CALCIUM 40 MG PO TABS
40.0000 mg | ORAL_TABLET | Freq: Every day | ORAL | 3 refills | Status: DC
Start: 2020-02-12 — End: 2020-02-14

## 2020-02-12 MED FILL — ROSUVASTATIN CALCIUM 40 MG: 40 | 90 days supply | Qty: 90 | Fill #0

## 2020-02-13 ENCOUNTER — Ambulatory Visit: Payer: 59 | Admitting: Orthopaedic Surgery

## 2020-02-13 ENCOUNTER — Encounter: Payer: Self-pay | Admitting: Orthopaedic Surgery

## 2020-02-13 DIAGNOSIS — M1711 Unilateral primary osteoarthritis, right knee: Secondary | ICD-10-CM

## 2020-02-13 MED ORDER — LIDOCAINE HCL 1 % IJ SOLN
5.0000 mL | INTRAMUSCULAR | Status: AC | PRN
  Administered 2020-02-13: 5 mL

## 2020-02-13 MED ORDER — SODIUM HYALURONATE 60 MG/3ML IX PRSY
60.0000 mg | PREFILLED_SYRINGE | INTRA_ARTICULAR | Status: AC | PRN
  Administered 2020-02-13: 60 mg via INTRA_ARTICULAR

## 2020-02-13 NOTE — Progress Notes (Signed)
° °  Procedure Note  Patient: Alisha Reed             Date of Birth: 09-26-1960           MRN: [id number]             Visit Date: 02/13/2020  Procedures: Visit Diagnoses:  1. Primary osteoarthritis of right knee     Large Joint Inj: R knee on 02/13/2020 1:33 PM Indications: pain Details: 22 G needle  Arthrogram: No  Medications: 60 mg Sodium Hyaluronate 60 MG/3ML; 5 mL lidocaine 1 % Outcome: tolerated well, no immediate complications Patient was prepped and draped in the usual sterile fashion.

## 2020-02-14 MED ORDER — ROSUVASTATIN CALCIUM 40 MG PO TABS
40.0000 mg | ORAL_TABLET | Freq: Every day | ORAL | 3 refills | Status: DC
Start: 2020-02-14 — End: 2020-11-15

## 2020-02-21 MED FILL — LEVOTHYROXINE 75 MCG TABLET: 75 | 90 days supply | Qty: 90 | Fill #1

## 2020-02-22 DIAGNOSIS — D2371 Other benign neoplasm of skin of right lower limb, including hip: Secondary | ICD-10-CM | POA: Diagnosis not present

## 2020-02-22 DIAGNOSIS — Z808 Family history of malignant neoplasm of other organs or systems: Secondary | ICD-10-CM | POA: Diagnosis not present

## 2020-02-22 DIAGNOSIS — L821 Other seborrheic keratosis: Secondary | ICD-10-CM | POA: Diagnosis not present

## 2020-02-22 DIAGNOSIS — D2372 Other benign neoplasm of skin of left lower limb, including hip: Secondary | ICD-10-CM | POA: Diagnosis not present

## 2020-02-22 DIAGNOSIS — L570 Actinic keratosis: Secondary | ICD-10-CM | POA: Diagnosis not present

## 2020-02-22 DIAGNOSIS — L578 Other skin changes due to chronic exposure to nonionizing radiation: Secondary | ICD-10-CM | POA: Diagnosis not present

## 2020-02-22 DIAGNOSIS — D225 Melanocytic nevi of trunk: Secondary | ICD-10-CM | POA: Diagnosis not present

## 2020-02-28 ENCOUNTER — Other Ambulatory Visit (HOSPITAL_COMMUNITY): Payer: Self-pay | Admitting: Registered Nurse

## 2020-02-28 MED FILL — ESTRADIOL 0.5 MG TABS: 0.5 | 90 days supply | Qty: 90 | Fill #0

## 2020-03-06 MED FILL — CELECOXIB 200 MG CAP: 200 | 30 days supply | Qty: 60 | Fill #2

## 2020-04-08 ENCOUNTER — Other Ambulatory Visit (HOSPITAL_COMMUNITY): Payer: Self-pay | Admitting: Registered Nurse

## 2020-04-08 DIAGNOSIS — Z1231 Encounter for screening mammogram for malignant neoplasm of breast: Secondary | ICD-10-CM | POA: Diagnosis not present

## 2020-04-08 DIAGNOSIS — Z9229 Personal history of other drug therapy: Secondary | ICD-10-CM | POA: Diagnosis not present

## 2020-04-08 DIAGNOSIS — E663 Overweight: Secondary | ICD-10-CM | POA: Diagnosis not present

## 2020-04-08 DIAGNOSIS — Z719 Counseling, unspecified: Secondary | ICD-10-CM | POA: Diagnosis not present

## 2020-04-08 DIAGNOSIS — Z01419 Encounter for gynecological examination (general) (routine) without abnormal findings: Secondary | ICD-10-CM | POA: Diagnosis not present

## 2020-04-08 MED FILL — CELECOXIB 200 MG CAP: 200 | 30 days supply | Qty: 60 | Fill #3

## 2020-04-16 ENCOUNTER — Encounter: Payer: Self-pay | Admitting: Orthopaedic Surgery

## 2020-04-16 ENCOUNTER — Other Ambulatory Visit: Payer: Self-pay | Admitting: Orthopaedic Surgery

## 2020-04-16 MED ORDER — GABAPENTIN 100 MG PO CAPS: 100.0000 mg | ORAL_CAPSULE | Freq: Three times a day (TID) | ORAL | 3 refills | Status: DC

## 2020-04-16 MED FILL — GABAPENTIN 100 MG CAPSULE: 100 | 5 days supply | Qty: 30 | Fill #0

## 2020-04-24 MED FILL — ROSUVASTATIN CALCIUM 40 MG: 40 | 90 days supply | Qty: 90 | Fill #1

## 2020-04-26 ENCOUNTER — Other Ambulatory Visit: Payer: Self-pay | Admitting: Orthopaedic Surgery

## 2020-04-26 ENCOUNTER — Encounter: Payer: Self-pay | Admitting: Orthopaedic Surgery

## 2020-04-26 ENCOUNTER — Other Ambulatory Visit: Payer: Self-pay

## 2020-04-26 ENCOUNTER — Ambulatory Visit: Payer: 59 | Admitting: Orthopaedic Surgery

## 2020-04-26 ENCOUNTER — Ambulatory Visit (INDEPENDENT_AMBULATORY_CARE_PROVIDER_SITE_OTHER): Payer: 59

## 2020-04-26 DIAGNOSIS — M79671 Pain in right foot: Secondary | ICD-10-CM

## 2020-04-26 MED ORDER — METHYLPREDNISOLONE 4 MG PO TBPK: ORAL_TABLET | ORAL | 0 refills | Status: DC

## 2020-04-26 MED ORDER — TRAZODONE HCL 50 MG PO TABS: 50.0000 mg | ORAL_TABLET | Freq: Every day | ORAL | 2 refills | Status: DC

## 2020-04-26 MED ORDER — DULOXETINE HCL 20 MG PO CPEP: 20.0000 mg | ORAL_CAPSULE | Freq: Every day | ORAL | 3 refills | Status: DC

## 2020-04-26 MED FILL — traZODone HCL 50 MG TABS: 50 | 30 days supply | Qty: 30 | Fill #0

## 2020-04-26 MED FILL — DULoxetine HCL 20 MG CPEP: 20 | 20 days supply | Qty: 20 | Fill #0

## 2020-04-26 MED FILL — METHYLPREDNISOLONE 4 MG TBP: 4 | 6 days supply | Qty: 21 | Fill #0

## 2020-04-26 NOTE — Progress Notes (Signed)
Office Visit Note   Patient: Alisha Reed           Date of Birth: 1961/05/04           MRN: [id number] Visit Date: 04/26/2020              Requested by: Biagio Borg, MD [address] [place],  [place] [address] PCP: Biagio Borg, MD   Assessment & Plan: Visit Diagnoses:  1. Pain in right foot     Plan: Impression is severe right lateral foot pain possible peroneal tendinitis.  We will immobilized in a cam boot.  Prescriptions for Medrol Dosepak, Cymbalta, trazodone.  She will follow up with [place] if she does not notice any relief.  Follow-Up Instructions: Return if symptoms worsen or fail to improve.   Orders:  Orders Placed This Encounter  Procedures  . XR Foot Complete Right  . XR Ankle 2 Views Right   Meds ordered this encounter  Medications  . methylPREDNISolone (MEDROL DOSEPAK) 4 MG TBPK tablet    Sig: Use as directed    Dispense:  21 tablet    Refill:  0  . DULoxetine (CYMBALTA) 20 MG capsule    Sig: Take 1 capsule (20 mg total) by mouth daily.    Dispense:  20 capsule    Refill:  3  . traZODone (DESYREL) 50 MG tablet    Sig: Take 1 tablet (50 mg total) by mouth at bedtime.    Dispense:  30 tablet    Refill:  2      Procedures: No procedures performed   Clinical Data: No additional findings.   Subjective: Chief Complaint  Patient presents with  . Right Foot - Pain  . Right Ankle - Pain    [name] comes in today for evaluation of worsening right foot and ankle pain and weakness.  This started in August and progressively has gotten worse.  The pain is on the lateral side.  She endorses a burning pain that is constant.  Denies any numbness.  Denies any back pain or radicular symptoms.  She has had a prior flatfoot correction many years ago by Dr. Ouida Sills in [place].  She denies any injuries.  She has used gabapentin without any relief.  She is also used over-the-counter NSAIDs without relief.   Review of Systems  Constitutional: Negative.    HENT: Negative.   Eyes: Negative.   Respiratory: Negative.   Cardiovascular: Negative.   Endocrine: Negative.   Musculoskeletal: Negative.   Neurological: Negative.   Hematological: Negative.   Psychiatric/Behavioral: Negative.   All other systems reviewed and are negative.    Objective: Vital Signs: There were no vitals taken for this visit.  Physical Exam Vitals and nursing note reviewed.  Constitutional:      Appearance: She is well-developed and well-nourished.  Pulmonary:     Effort: Pulmonary effort is normal.  Skin:    General: Skin is warm.     Capillary Refill: Capillary refill takes less than 2 seconds.  Neurological:     Mental Status: She is alert and oriented to person, place, and time.  Psychiatric:        Mood and Affect: Mood and affect normal.        Behavior: Behavior normal.        Thought Content: Thought content normal.        Judgment: Judgment normal.     Ortho Exam Right foot and ankle shows pain with ankle adduction  and forefoot supination.  She is tender along the peroneal tendons distal to the lateral malleolus.  Fifth metatarsal is tender throughout its length.  No medial sided ankle symptoms or palpation.  There is no swelling.  Achilles and plantar fascial nontender.  She has no significant pain with resisted peroneal tendon activation.  Specialty Comments:  No specialty comments available.  Imaging: XR Ankle 2 Views Right  Result Date: 04/26/2020 No acute or structural abnormalities  XR Foot Complete Right  Result Date: 04/26/2020 No acute or structural abnormalities.  Presence of screw in the calcaneus without any complications.    PMFS History: Patient Active Problem List   Diagnosis Date Noted  . S/p bilateral carpal tunnel release 03/28/2019  . Chronic pain of right knee 12/29/2018  . Right carpal tunnel syndrome 12/29/2018  . Left carpal tunnel syndrome 12/29/2018  . Osteopenia 11/08/2018  . Pre-diabetes 05/02/2018   . Acute pain of right shoulder 03/18/2018  . Hyperlipidemia 11/04/2017  . Vitamin D deficiency 11/04/2017  . S/P cholecystectomy 08/02/2017  . Skier's thumb, right, subsequent encounter 03/15/2017  . Chronic left shoulder pain 02/08/2017  . Nondisplaced fracture of greater tuberosity of left humerus, initial encounter for closed fracture 01/18/2017  . Preventative health care 09/24/2016  . Hypothyroidism 09/24/2016   Past Medical History:  Diagnosis Date  . Arthritis    hands,   . Carpal tunnel syndrome on both sides    both hands surgeries 01/2019  . GERD (gastroesophageal reflux disease)   . Hypercholesterolemia   . Hypothyroidism   . Osteopenia 11/08/2018  . Thyroid disease     Family History  Problem Relation Age of Onset  . Scleroderma Mother   . Rheum arthritis Mother   . Heart disease Mother   . Heart disease Father   . Glaucoma Father   . Stomach cancer Maternal Grandmother   . Heart attack Maternal Grandfather   . Alzheimer's disease Paternal Grandmother   . Throat cancer Paternal Grandfather   . Colon cancer Neg Hx   . Liver cancer Neg Hx   . Colon polyps Neg Hx   . Esophageal cancer Neg Hx   . Rectal cancer Neg Hx     Past Surgical History:  Procedure Laterality Date  . ABDOMINAL HYSTERECTOMY    . BILATERAL CARPAL TUNNEL RELEASE Bilateral 01/18/2019   Procedure: BILATERAL CARPAL TUNNEL RELEASE;  Surgeon: Leandrew Koyanagi, MD;  Location: [facility];  Service: Orthopedics;  Laterality: Bilateral;  . cholecystectomy    . CHOLECYSTECTOMY    . COLONOSCOPY  2011  . FOOT SURGERY Bilateral   . HYSTERECTOMY ABDOMINAL WITH SALPINGECTOMY    . NASAL SINUS SURGERY     Social History   Occupational History  . Occupation: Therapist, sports  Tobacco Use  . Smoking status: Never Smoker  . Smokeless tobacco: Never Used  Vaping Use  . Vaping Use: Never used  Substance and Sexual Activity  . Alcohol use: Yes    Comment: social  . Drug use: No  . Sexual activity: Yes     Birth control/protection: Surgical

## 2020-05-10 ENCOUNTER — Telehealth: Payer: 59 | Admitting: Physician Assistant

## 2020-05-10 DIAGNOSIS — R059 Cough, unspecified: Secondary | ICD-10-CM | POA: Diagnosis not present

## 2020-05-10 MED ORDER — AMOXICILLIN 500 MG PO CAPS: 500.0000 mg | ORAL_CAPSULE | Freq: Two times a day (BID) | ORAL | 0 refills | Status: AC

## 2020-05-10 MED ORDER — AEROCHAMBER PLUS FLO-VU MEDIUM MISC: 1.0000 | Freq: Once | 0 refills | Status: AC

## 2020-05-10 MED ORDER — ALBUTEROL SULFATE HFA 108 (90 BASE) MCG/ACT IN AERS: 2.0000 | INHALATION_SPRAY | RESPIRATORY_TRACT | 0 refills | Status: DC | PRN

## 2020-05-10 MED ORDER — FLUTICASONE PROPIONATE 50 MCG/ACT NA SUSP: 2.0000 | Freq: Every day | NASAL | 0 refills | Status: DC

## 2020-05-10 MED ORDER — BENZONATATE 100 MG PO CAPS: 100.0000 mg | ORAL_CAPSULE | Freq: Three times a day (TID) | ORAL | 0 refills | Status: DC | PRN

## 2020-05-10 NOTE — Progress Notes (Signed)
We are sorry that you are not feeling well.  Here is how we plan to help!  Based on your presentation I believe you most likely have A cough due to a virus.  This is called viral bronchitis and is best treated by rest, plenty of fluids and control of the cough.  You may use Ibuprofen or Tylenol as directed to help your symptoms.     In addition you may use A prescription cough medication called Tessalon Perles 100mg . You may take 1-2 capsules every 8 hours as needed for your cough. Flonase, albuterol and Amoxicillin.     From your responses in the eVisit questionnaire you describe inflammation in the upper respiratory tract which is causing a significant cough.  This is commonly called Bronchitis and has four common causes:    Allergies  Viral Infections  Acid Reflux  Bacterial Infection Allergies, viruses and acid reflux are treated by controlling symptoms or eliminating the cause. An example might be a cough caused by taking certain blood pressure medications. You stop the cough by changing the medication. Another example might be a cough caused by acid reflux. Controlling the reflux helps control the cough.  USE OF BRONCHODILATOR ("RESCUE") INHALERS: There is a risk from using your bronchodilator too frequently.  The risk is that over-reliance on a medication which only relaxes the muscles surrounding the breathing tubes can reduce the effectiveness of medications prescribed to reduce swelling and congestion of the tubes themselves.  Although you feel brief relief from the bronchodilator inhaler, your asthma may actually be worsening with the tubes becoming more swollen and filled with mucus.  This can delay other crucial treatments, such as oral steroid medications. If you need to use a bronchodilator inhaler daily, several times per day, you should discuss this with your provider.  There are probably better treatments that could be used to keep your asthma under control.     HOME  CARE . Only take medications as instructed by your medical team. . Complete the entire course of an antibiotic. . Drink plenty of fluids and get plenty of rest. . Avoid close contacts especially the very young and the elderly . Cover your mouth if you cough or cough into your sleeve. . Always remember to wash your hands . A steam or ultrasonic humidifier can help congestion.   GET HELP RIGHT AWAY IF: . You develop worsening fever. . You become short of breath . You cough up blood. . Your symptoms persist after you have completed your treatment plan MAKE SURE YOU   Understand these instructions.  Will watch your condition.  Will get help right away if you are not doing well or get worse.  Your e-visit answers were reviewed by a board certified advanced clinical practitioner to complete your personal care plan.  Depending on the condition, your plan could have included both over the counter or prescription medications. If there is a problem please reply  once you have received a response from your provider. Your safety is important to [place].  If you have drug allergies check your prescription carefully.    You can use MyChart to ask questions about today's visit, request a non-urgent call back, or ask for a work or school excuse for 24 hours related to this e-Visit. If it has been greater than 24 hours you will need to follow up with your provider, or enter a new e-Visit to address those concerns. You will get an e-mail in the next two days  asking about your experience.  I hope that your e-visit has been valuable and will speed your recovery. Thank you for using e-visits.  Greater than 5 minutes, yet less than 10 minutes of time have been spent researching, coordinating, and implementing care for this patient today

## 2020-05-15 MED FILL — CELECOXIB 200 MG CAP: 200 | 30 days supply | Qty: 60 | Fill #4

## 2020-05-15 MED FILL — ESTRADIOL 1 MG TABS: 1 | 90 days supply | Qty: 90 | Fill #0

## 2020-05-16 ENCOUNTER — Other Ambulatory Visit: Payer: Self-pay | Admitting: Internal Medicine

## 2020-05-16 MED FILL — LEVOTHYROXINE 75 MCG TABLET: 75 | 90 days supply | Qty: 90 | Fill #0

## 2020-05-16 NOTE — Telephone Encounter (Signed)
Please refill as per office routine med refill policy (all routine meds refilled for 3 mo or monthly per pt preference up to one year from last visit, then month to month grace period for 3 mo, then further med refills will have to be denied)  

## 2020-06-04 MED FILL — DULoxetine HCL 20 MG CPEP: 20 | 20 days supply | Qty: 20 | Fill #1

## 2020-06-04 MED FILL — OMEPRAZOLE DR 20 MG CAPSULE: 20 | 90 days supply | Qty: 90 | Fill #1

## 2020-06-04 MED FILL — GABAPENTIN 100 MG CAPSULE: 100 | 5 days supply | Qty: 30 | Fill #1

## 2020-06-04 MED FILL — traZODone HCL 50 MG TABS: 50 | 30 days supply | Qty: 30 | Fill #1

## 2020-06-17 MED FILL — CELECOXIB 200 MG CAP: 200 | 30 days supply | Qty: 60 | Fill #5

## 2020-07-02 ENCOUNTER — Encounter: Payer: Self-pay | Admitting: Orthopaedic Surgery

## 2020-07-11 ENCOUNTER — Encounter: Payer: Self-pay | Admitting: Orthopaedic Surgery

## 2020-07-15 MED FILL — CELECOXIB 200 MG CAP: 200 | 30 days supply | Qty: 60 | Fill #6

## 2020-07-17 MED FILL — ROSUVASTATIN CALCIUM 40 MG: 40 | 90 days supply | Qty: 90 | Fill #2

## 2020-07-22 ENCOUNTER — Other Ambulatory Visit: Payer: Self-pay | Admitting: Physician Assistant

## 2020-07-22 MED ORDER — TRAMADOL HCL 50 MG PO TABS: 50.0000 mg | ORAL_TABLET | Freq: Every day | ORAL | 0 refills | Status: DC | PRN

## 2020-07-22 NOTE — Telephone Encounter (Signed)
I sent in tramadol

## 2020-08-07 MED FILL — LEVOTHYROXINE 75 MCG TABLET: 75 | 90 days supply | Qty: 90 | Fill #1

## 2020-08-07 MED FILL — ESTRADIOL 1 MG TABS: 1 | 90 days supply | Qty: 90 | Fill #1

## 2020-08-10 ENCOUNTER — Other Ambulatory Visit (HOSPITAL_COMMUNITY): Payer: Self-pay

## 2020-08-13 ENCOUNTER — Other Ambulatory Visit (HOSPITAL_COMMUNITY): Payer: Self-pay

## 2020-08-13 MED FILL — Celecoxib Cap 200 MG: ORAL | 30 days supply | Qty: 60 | Fill #0 | Status: AC

## 2020-08-16 IMAGING — CT CT ABD-PELV W/ CM
2 of 5 series · 15 of 46 positions shown, 17 images · IV contrast (ISOVUE 300)
Comparison: None.

CLINICAL DATA: Lower abdominal pain

EXAM:
CT ABDOMEN AND PELVIS WITH CONTRAST
TECHNIQUE: Multidetector CT imaging of the abdomen and pelvis was performed
using the standard protocol following bolus administration of
intravenous contrast. Oral contrast was also administered.
CONTRAST:  100mL 8LMYGG-4II IOPAMIDOL (8LMYGG-4II) INJECTION 61%

[Series 2: abd/pel w · axial · 0.68mm/px · z∈[+794,+1214]mm · 12 of 94 slices shown, 14 images]
[im 5/94  soft-tissue]
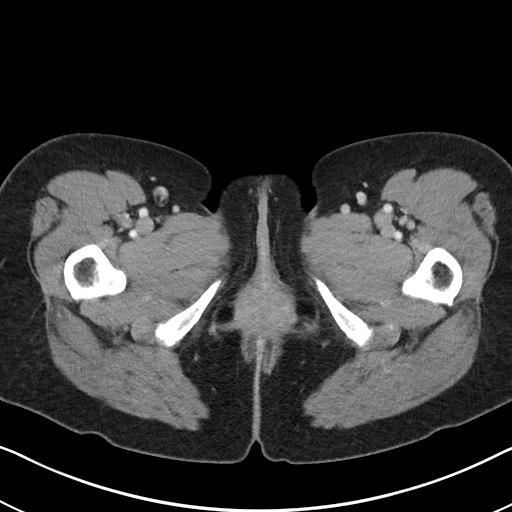
[im 5/94  bone]
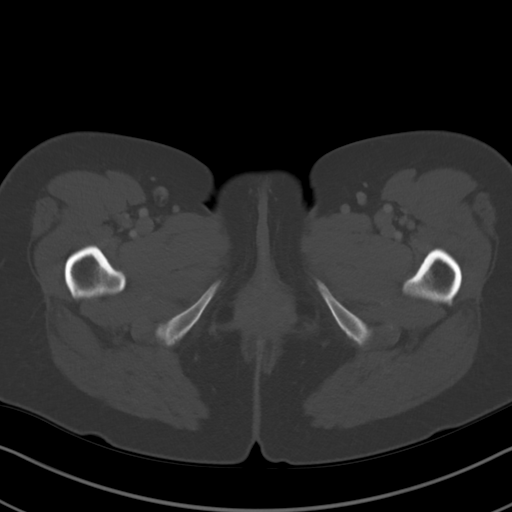
[im 15/94  soft-tissue]
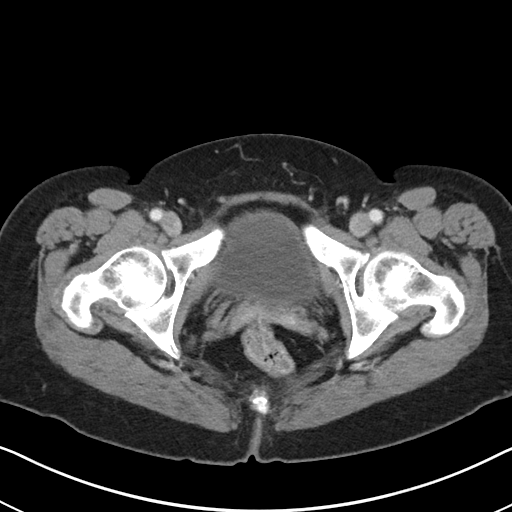
[im 20/94  soft-tissue]
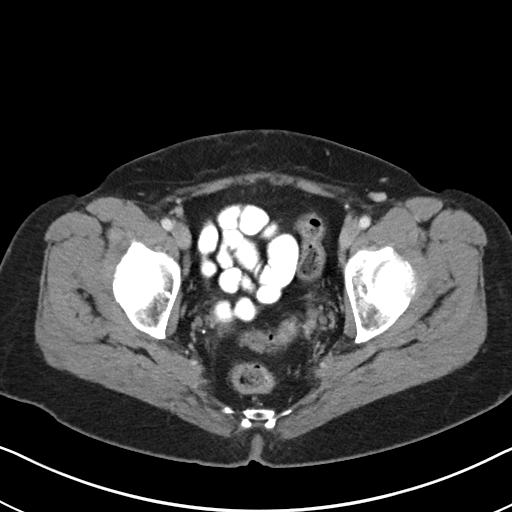
[im 30/94  soft-tissue]
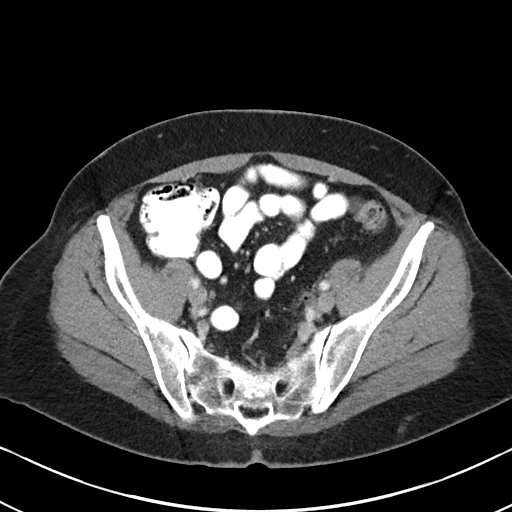
[im 35/94  soft-tissue]
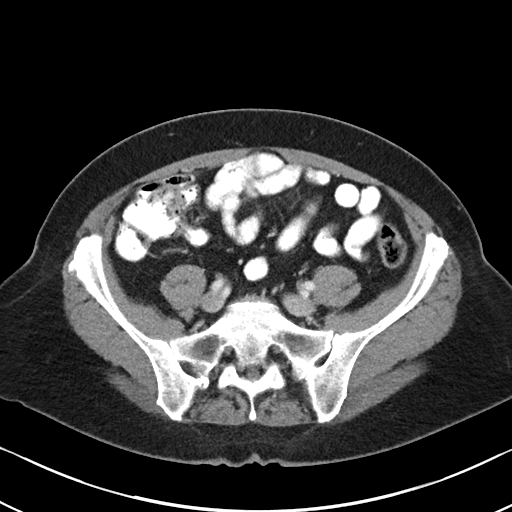
[im 45/94  soft-tissue]
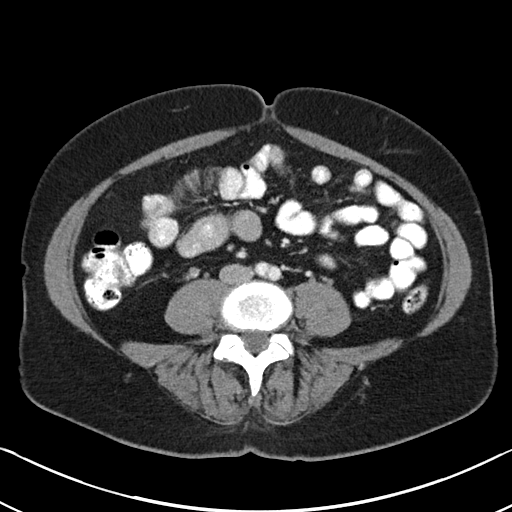
[im 49/94  soft-tissue]
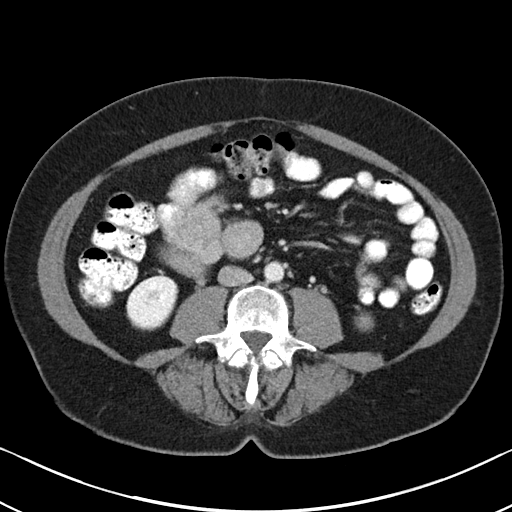
[im 59/94  soft-tissue]
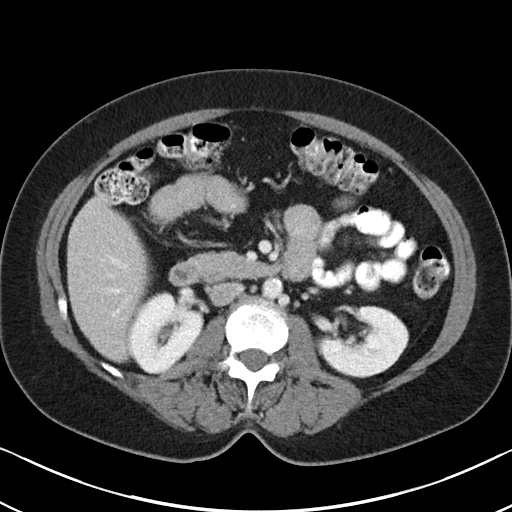
[im 64/94  soft-tissue]
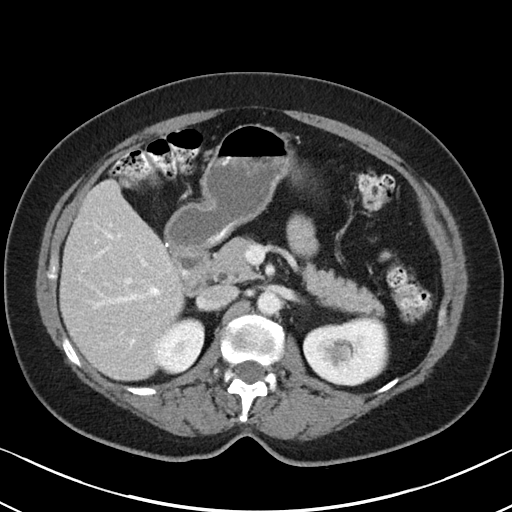
[im 64/94  bone]
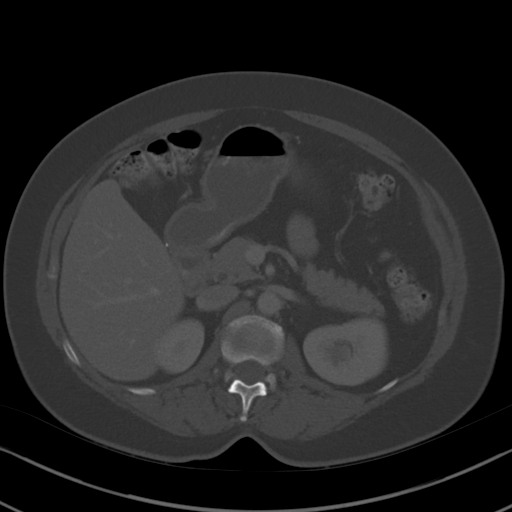
[im 74/94  soft-tissue]
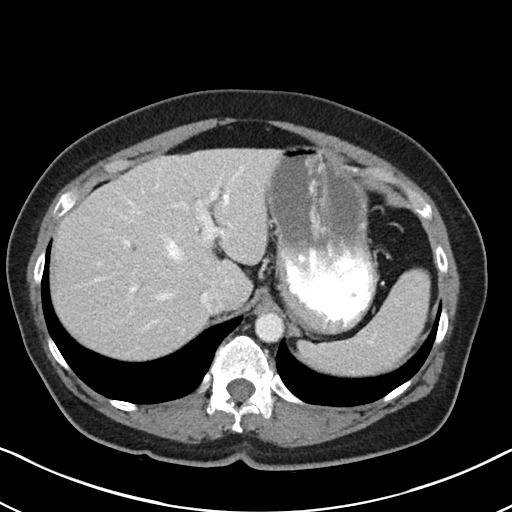
[im 79/94  soft-tissue]
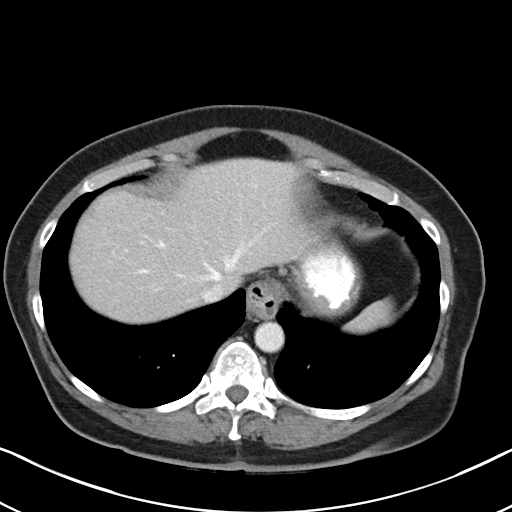
[im 89/94  soft-tissue]
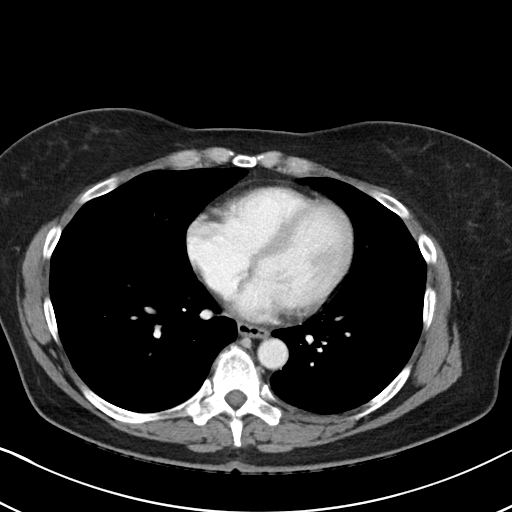

[Series 5: abd/pel w st · coronal · 0.61mm/px · 3 of 82 slices shown]
[im 28/82  soft-tissue]
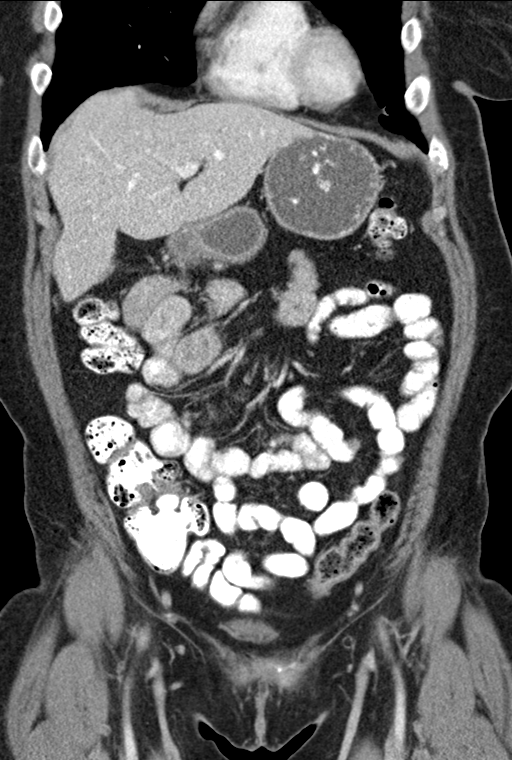
[im 37/82  soft-tissue]
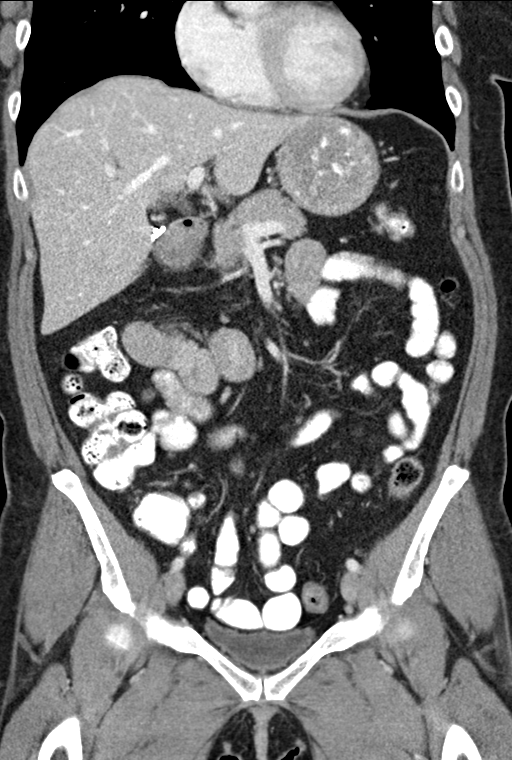
[im 46/82  soft-tissue]
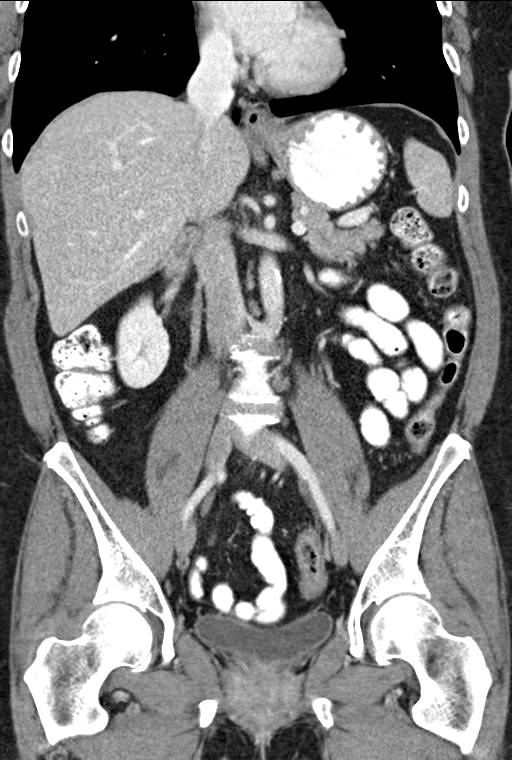

[15 of 46 positions shown; findings below may reference images not displayed]

FINDINGS: Lower chest: Lung bases are clear.  There is a small hiatal hernia.

Hepatobiliary: No focal liver lesions are appreciable. Gallbladder
is absent. There is no biliary duct dilatation.

Pancreas: No pancreatic mass or inflammatory focus.

Spleen: No splenic lesions are evident.

Adrenals/Urinary Tract: Adrenals bilaterally appear normal. Kidneys
bilaterally show no evident mass or hydronephrosis on either side.
There is no evident renal or ureteral calculus on either side.
Urinary bladder is midline with wall thickness within normal limits.

Stomach/Bowel: There is no appreciable bowel wall or mesenteric
thickening. There is no evident bowel obstruction. There is no free
air or portal venous air.

Vascular/Lymphatic: There is aortic atherosclerosis. No aneurysm
evident. Major mesenteric arterial vessels appear patent. There is
no evident adenopathy in the abdomen or pelvis.

Reproductive: The uterus is absent.  No pelvic mass evident.

Other: Appendix appears normal. There is no abscess or ascites in
the abdomen or pelvis.

Musculoskeletal: There are no blastic or lytic bone lesions. There
is no intramuscular or abdominal wall lesion.
IMPRESSION: 1. No evident diverticulitis. No bowel obstruction. No abscess in
the abdomen or pelvis. Appendix appears normal.

2. No evident renal or ureteral calculus. No hydronephrosis. Urinary
bladder wall thickness normal.

3.  Small hiatal hernia.

4.  Gallbladder absent.

Comment: A cause for patient's symptoms has not been established
with this study.

## 2020-09-01 ENCOUNTER — Encounter: Payer: Self-pay | Admitting: Orthopaedic Surgery

## 2020-09-02 ENCOUNTER — Telehealth: Payer: Self-pay

## 2020-09-02 NOTE — Telephone Encounter (Signed)
VOB has been submitted for Durolane, right knee. Pending BV.

## 2020-09-09 ENCOUNTER — Encounter: Payer: Self-pay | Admitting: Orthopaedic Surgery

## 2020-09-09 ENCOUNTER — Telehealth: Payer: Self-pay

## 2020-09-09 NOTE — Telephone Encounter (Signed)
Called and left a VM advising patient to CB to schedule an appointment with Dr. Xu for gel injection.  Approved for Durolane, right knee. Buy & Bill Must meet deductible first  Covered at 100% of the allowable amount. Co-pay of $50.00 No PA required 

## 2020-09-12 ENCOUNTER — Ambulatory Visit: Payer: 59 | Admitting: Orthopaedic Surgery

## 2020-09-12 ENCOUNTER — Encounter: Payer: Self-pay | Admitting: Orthopaedic Surgery

## 2020-09-12 DIAGNOSIS — M1711 Unilateral primary osteoarthritis, right knee: Secondary | ICD-10-CM | POA: Diagnosis not present

## 2020-09-12 MED ORDER — SODIUM HYALURONATE 60 MG/3ML IX PRSY
60.0000 mg | PREFILLED_SYRINGE | INTRA_ARTICULAR | Status: AC | PRN
  Administered 2020-09-12: 60 mg via INTRA_ARTICULAR

## 2020-09-12 NOTE — Progress Notes (Signed)
Office Visit Note   Patient: Alisha Reed           Date of Birth: 1961-04-27           MRN: [id number] Visit Date: 09/12/2020              Requested by: Biagio Borg, MD [address] [place],  [place] [address] PCP: Biagio Borg, MD   Assessment & Plan: Visit Diagnoses:  1. Primary osteoarthritis of right knee     Plan: Alisha Reed underwent durolane injection to the right knee without complication.    Follow-Up Instructions: Return if symptoms worsen or fail to improve.   Orders:  No orders of the defined types were placed in this encounter.  No orders of the defined types were placed in this encounter.     Procedures: Large Joint Inj: R knee on 09/12/2020 8:18 AM Indications: pain Details: 22 G needle  Arthrogram: No  Medications: 60 mg Sodium Hyaluronate 60 MG/3ML Outcome: tolerated well, no immediate complications Patient was prepped and draped in the usual sterile fashion.       Clinical Data: No additional findings.   Subjective: No chief complaint on file.   Alisha Reed is here today for visco injection.  These injections have helped quite a bit with her pain and activities.   Review of Systems   Objective: Vital Signs: There were no vitals taken for this visit.  Physical Exam  Ortho Exam  Specialty Comments:  No specialty comments available.  Imaging: No results found.   PMFS History: Patient Active Problem List   Diagnosis Date Noted  . S/p bilateral carpal tunnel release 03/28/2019  . Chronic pain of right knee 12/29/2018  . Right carpal tunnel syndrome 12/29/2018  . Left carpal tunnel syndrome 12/29/2018  . Osteopenia 11/08/2018  . Pre-diabetes 05/02/2018  . Acute pain of right shoulder 03/18/2018  . Hyperlipidemia 11/04/2017  . Vitamin D deficiency 11/04/2017  . S/P cholecystectomy 08/02/2017  . Skier's thumb, right, subsequent encounter 03/15/2017  . Chronic left shoulder pain 02/08/2017  . Nondisplaced fracture of greater  tuberosity of left humerus, initial encounter for closed fracture 01/18/2017  . Preventative health care 09/24/2016  . Hypothyroidism 09/24/2016   Past Medical History:  Diagnosis Date  . Arthritis    hands,   . Carpal tunnel syndrome on both sides    both hands surgeries 01/2019  . GERD (gastroesophageal reflux disease)   . Hypercholesterolemia   . Hypothyroidism   . Osteopenia 11/08/2018  . Thyroid disease     Family History  Problem Relation Age of Onset  . Scleroderma Mother   . Rheum arthritis Mother   . Heart disease Mother   . Heart disease Father   . Glaucoma Father   . Stomach cancer Maternal Grandmother   . Heart attack Maternal Grandfather   . Alzheimer's disease Paternal Grandmother   . Throat cancer Paternal Grandfather   . Colon cancer Neg Hx   . Liver cancer Neg Hx   . Colon polyps Neg Hx   . Esophageal cancer Neg Hx   . Rectal cancer Neg Hx     Past Surgical History:  Procedure Laterality Date  . ABDOMINAL HYSTERECTOMY    . BILATERAL CARPAL TUNNEL RELEASE Bilateral 01/18/2019   Procedure: BILATERAL CARPAL TUNNEL RELEASE;  Surgeon: Leandrew Koyanagi, MD;  Location: [facility];  Service: Orthopedics;  Laterality: Bilateral;  . cholecystectomy    . CHOLECYSTECTOMY    . COLONOSCOPY  2011  . FOOT SURGERY Bilateral   . HYSTERECTOMY ABDOMINAL WITH SALPINGECTOMY    . NASAL SINUS SURGERY     Social History   Occupational History  . Occupation: Therapist, sports  Tobacco Use  . Smoking status: Never Smoker  . Smokeless tobacco: Never Used  Vaping Use  . Vaping Use: Never used  Substance and Sexual Activity  . Alcohol use: Yes    Comment: social  . Drug use: No  . Sexual activity: Yes    Birth control/protection: Surgical

## 2020-09-13 ENCOUNTER — Telehealth: Payer: Self-pay

## 2020-09-13 NOTE — Telephone Encounter (Signed)
Called and left a VM advising patient to CB to schedule an appointment with Dr. Erlinda Hong for gel injection.  Approved for Durolane, right knee. [place] Must meet deductible first  Covered at 100% of the allowable amount. Co-pay of $50.00 No PA required

## 2020-09-15 MED FILL — Celecoxib Cap 200 MG: ORAL | 30 days supply | Qty: 60 | Fill #1 | Status: AC

## 2020-09-16 ENCOUNTER — Other Ambulatory Visit (HOSPITAL_COMMUNITY): Payer: Self-pay

## 2020-10-14 MED FILL — Celecoxib Cap 200 MG: ORAL | 30 days supply | Qty: 60 | Fill #2 | Status: AC

## 2020-10-14 MED FILL — Rosuvastatin Calcium Tab 40 MG: ORAL | 90 days supply | Qty: 90 | Fill #0 | Status: AC

## 2020-10-15 ENCOUNTER — Other Ambulatory Visit (HOSPITAL_COMMUNITY): Payer: Self-pay

## 2020-10-27 ENCOUNTER — Encounter: Payer: Self-pay | Admitting: Internal Medicine

## 2020-10-27 ENCOUNTER — Other Ambulatory Visit: Payer: Self-pay | Admitting: Family

## 2020-10-27 DIAGNOSIS — N393 Stress incontinence (female) (male): Secondary | ICD-10-CM

## 2020-10-28 ENCOUNTER — Other Ambulatory Visit (HOSPITAL_COMMUNITY): Payer: Self-pay

## 2020-11-06 ENCOUNTER — Other Ambulatory Visit (INDEPENDENT_AMBULATORY_CARE_PROVIDER_SITE_OTHER): Payer: 59

## 2020-11-06 DIAGNOSIS — R7303 Prediabetes: Secondary | ICD-10-CM | POA: Diagnosis not present

## 2020-11-06 DIAGNOSIS — Z0000 Encounter for general adult medical examination without abnormal findings: Secondary | ICD-10-CM | POA: Diagnosis not present

## 2020-11-06 LAB — HEPATIC FUNCTION PANEL
ALT: 13 U/L (ref 0–35)
AST: 16 U/L (ref 0–37)
Albumin: 3.9 g/dL (ref 3.5–5.2)
Alkaline Phosphatase: 50 U/L (ref 39–117)
Bilirubin, Direct: 0.1 mg/dL (ref 0.0–0.3)
Total Bilirubin: 0.4 mg/dL (ref 0.2–1.2)
Total Protein: 6.6 g/dL (ref 6.0–8.3)

## 2020-11-06 LAB — URINALYSIS, ROUTINE W REFLEX MICROSCOPIC
Bilirubin Urine: NEGATIVE
Ketones, ur: NEGATIVE
Leukocytes,Ua: NEGATIVE
Nitrite: POSITIVE — AB
Specific Gravity, Urine: 1.025 (ref 1.000–1.030)
Total Protein, Urine: NEGATIVE
Urine Glucose: NEGATIVE
Urobilinogen, UA: 0.2 (ref 0.0–1.0)
pH: 6 (ref 5.0–8.0)

## 2020-11-06 LAB — CBC WITH DIFFERENTIAL/PLATELET
Basophils Absolute: 0 10*3/uL (ref 0.0–0.1)
Basophils Relative: 0.8 % (ref 0.0–3.0)
Eosinophils Absolute: 0.2 10*3/uL (ref 0.0–0.7)
Eosinophils Relative: 3.3 % (ref 0.0–5.0)
HCT: 37.1 % (ref 36.0–46.0)
Hemoglobin: 12.4 g/dL (ref 12.0–15.0)
Lymphocytes Relative: 33.1 % (ref 12.0–46.0)
Lymphs Abs: 1.9 10*3/uL (ref 0.7–4.0)
MCHC: 33.3 g/dL (ref 30.0–36.0)
MCV: 91.4 fl (ref 78.0–100.0)
Monocytes Absolute: 0.4 10*3/uL (ref 0.1–1.0)
Monocytes Relative: 7.1 % (ref 3.0–12.0)
Neutro Abs: 3.2 10*3/uL (ref 1.4–7.7)
Neutrophils Relative %: 55.7 % (ref 43.0–77.0)
Platelets: 279 10*3/uL (ref 150.0–400.0)
RBC: 4.06 Mil/uL (ref 3.87–5.11)
RDW: 14.5 % (ref 11.5–15.5)
WBC: 5.8 10*3/uL (ref 4.0–10.5)

## 2020-11-06 LAB — BASIC METABOLIC PANEL
BUN: 17 mg/dL (ref 6–23)
CO2: 30 mEq/L (ref 19–32)
Calcium: 9.3 mg/dL (ref 8.4–10.5)
Chloride: 104 mEq/L (ref 96–112)
Creatinine, Ser: 0.75 mg/dL (ref 0.40–1.20)
GFR: 85.84 mL/min (ref >60.00)
Glucose, Bld: 94 mg/dL (ref 70–99)
Potassium: 4.1 mEq/L (ref 3.5–5.1)
Sodium: 139 mEq/L (ref 135–145)

## 2020-11-06 LAB — LIPID PANEL
Cholesterol: 124 mg/dL (ref 0–200)
HDL: 52.2 mg/dL (ref >39.00)
LDL Cholesterol: 56 mg/dL (ref 0–99)
NonHDL: 71.65
Total CHOL/HDL Ratio: 2
Triglycerides: 76 mg/dL (ref 0.0–149.0)
VLDL: 15.2 mg/dL (ref 0.0–40.0)

## 2020-11-06 LAB — HEMOGLOBIN A1C: Hgb A1c MFr Bld: 6.3 % (ref 4.6–6.5)

## 2020-11-06 LAB — TSH: TSH: 1.03 u[IU]/mL (ref 0.35–4.50)

## 2020-11-10 ENCOUNTER — Other Ambulatory Visit: Payer: Self-pay | Admitting: Internal Medicine

## 2020-11-10 MED FILL — Estradiol Tab 1 MG: ORAL | 90 days supply | Qty: 90 | Fill #0 | Status: AC

## 2020-11-10 MED FILL — Celecoxib Cap 200 MG: ORAL | 30 days supply | Qty: 60 | Fill #3 | Status: AC

## 2020-11-10 NOTE — Telephone Encounter (Signed)
Please refill as per office routine med refill policy (all routine meds refilled for 3 mo or monthly per pt preference up to one year from last visit, then month to month grace period for 3 mo, then further med refills will have to be denied)  

## 2020-11-12 ENCOUNTER — Encounter: Payer: 59 | Admitting: Internal Medicine

## 2020-11-12 ENCOUNTER — Other Ambulatory Visit (HOSPITAL_COMMUNITY): Payer: Self-pay

## 2020-11-13 ENCOUNTER — Other Ambulatory Visit: Payer: Self-pay | Admitting: Internal Medicine

## 2020-11-13 ENCOUNTER — Other Ambulatory Visit (HOSPITAL_COMMUNITY): Payer: Self-pay

## 2020-11-13 NOTE — Telephone Encounter (Signed)
Please refill as per office routine med refill policy (all routine meds refilled for 3 mo or monthly per pt preference up to one year from last visit, then month to month grace period for 3 mo, then further med refills will have to be denied)  

## 2020-11-14 ENCOUNTER — Other Ambulatory Visit: Payer: Self-pay

## 2020-11-15 ENCOUNTER — Other Ambulatory Visit (HOSPITAL_COMMUNITY): Payer: Self-pay

## 2020-11-15 ENCOUNTER — Ambulatory Visit (INDEPENDENT_AMBULATORY_CARE_PROVIDER_SITE_OTHER): Payer: 59 | Admitting: Internal Medicine

## 2020-11-15 ENCOUNTER — Other Ambulatory Visit: Payer: Self-pay

## 2020-11-15 VITALS — BP 128/78 | HR 72 | Ht 64.0 in | Wt 149.0 lb

## 2020-11-15 DIAGNOSIS — E559 Vitamin D deficiency, unspecified: Secondary | ICD-10-CM

## 2020-11-15 DIAGNOSIS — R7303 Prediabetes: Secondary | ICD-10-CM

## 2020-11-15 DIAGNOSIS — E538 Deficiency of other specified B group vitamins: Secondary | ICD-10-CM

## 2020-11-15 DIAGNOSIS — Z0001 Encounter for general adult medical examination with abnormal findings: Secondary | ICD-10-CM | POA: Diagnosis not present

## 2020-11-15 DIAGNOSIS — N393 Stress incontinence (female) (male): Secondary | ICD-10-CM

## 2020-11-15 DIAGNOSIS — Z23 Encounter for immunization: Secondary | ICD-10-CM

## 2020-11-15 DIAGNOSIS — E039 Hypothyroidism, unspecified: Secondary | ICD-10-CM | POA: Diagnosis not present

## 2020-11-15 DIAGNOSIS — E7800 Pure hypercholesterolemia, unspecified: Secondary | ICD-10-CM | POA: Diagnosis not present

## 2020-11-15 MED ORDER — LEVOTHYROXINE SODIUM 75 MCG PO TABS
75.0000 ug | ORAL_TABLET | Freq: Every day | ORAL | 3 refills | Status: DC
  Filled 2020-11-15: qty 90, 90d supply, fill #0
  Filled 2021-02-05: qty 90, 90d supply, fill #1
  Filled 2021-05-12: qty 90, 90d supply, fill #2
  Filled 2021-08-11: qty 90, 90d supply, fill #3

## 2020-11-15 MED ORDER — ROSUVASTATIN CALCIUM 40 MG PO TABS
40.0000 mg | ORAL_TABLET | Freq: Every day | ORAL | 3 refills | Status: DC
  Filled 2020-11-15 – 2021-01-17 (×2): qty 90, 90d supply, fill #0
  Filled 2021-04-20: qty 90, 90d supply, fill #1
  Filled 2021-07-20: qty 67, 67d supply, fill #2
  Filled 2021-07-21: qty 23, 23d supply, fill #2
  Filled 2021-10-20: qty 90, 90d supply, fill #3

## 2020-11-15 NOTE — Assessment & Plan Note (Signed)
Last vitamin D Lab Results  Component Value Date   VD25OH 23.51 (L) 11/08/2018   Low, to start oral replacement

## 2020-11-15 NOTE — Progress Notes (Signed)
Patient ID: Alisha Reed, female   DOB: [birth year], 60 y.o.   MRN: [id number]         Chief Complaint:: wellness exam and urinary incontinence        HPI:  Alisha Reed is a 60 y.o. female here for wellness exam; decliens shingrix and covid booster, o/w upt to date with preventive referrals and immunizations.                        Also still working full time but planning on part time soon.  Denies urinary symptoms such as dysuria, frequency, urgency, flank pain, hematuria or n/v, fever, chills but does have 6 mo onset worsening urinary incontinence.  Pt denies chest pain, increased sob or doe, wheezing, orthopnea, PND, increased LE swelling, palpitations, dizziness or syncope.   Pt denies polydipsia, polyuria, or new focal neuro s/s.   Pt denies fever, wt loss, night sweats, loss of appetite, or other constitutional symptoms  No other new complaints   Wt Readings from Last 3 Encounters:  11/15/20 149 lb (67.6 kg)  01/30/20 168 lb (76.2 kg)  11/09/19 161 lb (73 kg)   BP Readings from Last 3 Encounters:  11/15/20 128/78  11/09/19 120/86  09/27/19 131/70   Immunization History  Administered Date(s) Administered   Influenza,inj,Quad PF,6+ Mos 01/14/2018   Influenza-Unspecified 02/13/2019   PFIZER(Purple Top)SARS-COV-2 Vaccination 05/03/2019, 05/24/2019   PNEUMOCOCCAL CONJUGATE-20 11/15/2020   Tdap 11/04/2017   There are no preventive care reminders to display for this patient.     Past Medical History:  Diagnosis Date   Arthritis    hands,    Carpal tunnel syndrome on both sides    both hands surgeries 01/2019   GERD (gastroesophageal reflux disease)    Hypercholesterolemia    Hypothyroidism    Osteopenia 11/08/2018   Thyroid disease    Past Surgical History:  Procedure Laterality Date   ABDOMINAL HYSTERECTOMY     BILATERAL CARPAL TUNNEL RELEASE Bilateral 01/18/2019   Procedure: BILATERAL CARPAL TUNNEL RELEASE;  Surgeon: Leandrew Koyanagi, MD;  Location: [facility];  Service: Orthopedics;  Laterality: Bilateral;   cholecystectomy     CHOLECYSTECTOMY     COLONOSCOPY  2011   FOOT SURGERY Bilateral    HYSTERECTOMY ABDOMINAL WITH SALPINGECTOMY     NASAL SINUS SURGERY      reports that she has never smoked. She has never used smokeless tobacco. She reports current alcohol use. She reports that she does not use drugs. family history includes Alzheimer's disease in her paternal grandmother; Glaucoma in her father; Heart attack in her maternal grandfather; Heart disease in her father and mother; Rheum arthritis in her mother; Scleroderma in her mother; Stomach cancer in her maternal grandmother; Throat cancer in her paternal grandfather. No Known Allergies Current Outpatient Medications on File Prior to Visit  Medication Sig Dispense Refill   Biotin 1000 MCG CHEW Chew by mouth.     calcium carbonate (OS-CAL - DOSED IN MG OF ELEMENTAL CALCIUM) 1250 (500 Ca) MG tablet Take 1 tablet by mouth.     Cholecalciferol (VITAMIN D) 50 MCG (2000 UT) CAPS Take by mouth.     DULoxetine (CYMBALTA) 20 MG capsule TAKE 1 CAPSULE (20 MG TOTAL) BY MOUTH DAILY. 20 capsule 3   estradiol (ESTRACE) 0.5 MG tablet TAKE 1 TABLET BY MOUTH DAILY 90 tablet 0   estradiol (ESTRACE) 1 MG tablet TAKE 1 TABLET BY MOUTH ONCE DAILY. 90 tablet 4  fluticasone (FLONASE) 50 MCG/ACT nasal spray Place 2 sprays into both nostrils daily. 9.9 g 0   traMADol (ULTRAM) 50 MG tablet TAKE 1 TABLET (50 MG TOTAL) BY MOUTH DAILY AS NEEDED. 30 tablet 0   Turmeric 500 MG CAPS Take 1 capsule by mouth.     omeprazole (PRILOSEC) 20 MG capsule TAKE 1 CAPSULE BY MOUTH ONCE DAILY 240 capsule 0   No current facility-administered medications on file prior to visit.        ROS:  All others reviewed and negative.  Objective        PE:  BP 128/78   Pulse 72   Ht 5\' 4"  (1.626 m)   Wt 149 lb (67.6 kg)   SpO2 99%   BMI 25.58 kg/m                 Constitutional: Pt appears in NAD               HENT: Head:  NCAT.                Right Ear: External ear normal.                 Left Ear: External ear normal.                Eyes: . Pupils are equal, round, and reactive to light. Conjunctivae and EOM are normal               Nose: without d/c or deformity               Neck: Neck supple. Gross normal ROM               Cardiovascular: Normal rate and regular rhythm.                 Pulmonary/Chest: Effort normal and breath sounds without rales or wheezing.                Abd:  Soft, NT, ND, + BS, no organomegaly               Neurological: Pt is alert. At baseline orientation, motor grossly intact               Skin: Skin is warm. No rashes, no other new lesions, LE edema - none               Psychiatric: Pt behavior is normal without agitation   Micro: none  Cardiac tracings I have personally interpreted today:  none  Pertinent Radiological findings (summarize): none   Lab Results  Component Value Date   WBC 5.8 11/06/2020   HGB 12.4 11/06/2020   HCT 37.1 11/06/2020   PLT 279.0 11/06/2020   GLUCOSE 94 11/06/2020   CHOL 124 11/06/2020   TRIG 76.0 11/06/2020   HDL 52.20 11/06/2020   LDLCALC 56 11/06/2020   ALT 13 11/06/2020   AST 16 11/06/2020   NA 139 11/06/2020   K 4.1 11/06/2020   CL 104 11/06/2020   CREATININE 0.75 11/06/2020   BUN 17 11/06/2020   CO2 30 11/06/2020   TSH 1.03 11/06/2020   HGBA1C 6.3 11/06/2020   Assessment/Plan:  Alisha Reed is a 60 y.o. White or Caucasian [1] female with  has a past medical history of Arthritis, Carpal tunnel syndrome on both sides, GERD (gastroesophageal reflux disease), Hypercholesterolemia, Hypothyroidism, Osteopenia (11/08/2018), and Thyroid disease.  Vitamin D deficiency Last vitamin D Lab Results  Component Value  Date   VD25OH 23.51 (L) 11/08/2018   Low, to start oral replacement   Encounter for well adult exam with abnormal findings Age and sex appropriate education and counseling updated with regular exercise and  diet Referrals for preventative services - none needed Immunizations addressed - declines shingrix and covid booster, for prevnar 20 Smoking counseling  - none needed Evidence for depression or other mood disorder - none significant Most recent labs reviewed. I have personally reviewed and have noted: 1) the patient's medical and social history 2) The patient's current medications and supplements 3) The patient's height, weight, and BMI have been recorded in the chart   Stress incontinence Also for urine studies, and refer urology per pt request  Hyperlipidemia Lab Results  Component Value Date   [place] 56 11/06/2020   Stable, pt to continue current statin crestor   Hypothyroidism Lab Results  Component Value Date   TSH 1.03 11/06/2020   Stable, pt to continue levothyroxine  Followup: No follow-ups on file.  Cathlean Cower, MD 11/19/2020 9:04 PM [facility]

## 2020-11-15 NOTE — Patient Instructions (Addendum)
You had the Prevnar 20 pneumonia shot today  Remember, Please take OTC Vitamin D3 at 2000 units per day, indefinitely  Please continue all other medications as before, and refills have been done if requested.  Please have the pharmacy call with any other refills you may need.  Please continue your efforts at being more active, low cholesterol diet, and weight control.  You are otherwise up to date with prevention measures today.  Please keep your appointments with your specialists as you may have planned  You will be contacted regarding the referral for: Urology  Please make an Appointment to return for your 1 year visit, or sooner if needed, with Lab testing by Appointment as well, to be done about 3-5 days before at the [facility] (so this is for TWO appointments - please see the scheduling desk as you leave)   Due to the ongoing Covid 19 pandemic, our lab now requires an appointment for any labs done at our office.  If you need labs done and do not have an appointment, please call our office ahead of time to schedule before presenting to the lab for your testing.

## 2020-11-19 ENCOUNTER — Encounter: Payer: Self-pay | Admitting: Internal Medicine

## 2020-11-19 DIAGNOSIS — N393 Stress incontinence (female) (male): Secondary | ICD-10-CM | POA: Insufficient documentation

## 2020-11-19 NOTE — Assessment & Plan Note (Signed)
Lab Results  Component Value Date   TSH 1.03 11/06/2020   Stable, pt to continue levothyroxine

## 2020-11-19 NOTE — Assessment & Plan Note (Signed)
Also for urine studies, and refer urology per pt request

## 2020-11-19 NOTE — Assessment & Plan Note (Addendum)
Age and sex appropriate education and counseling updated with regular exercise and diet Referrals for preventative services - none needed Immunizations addressed - declines shingrix and covid booster, for prevnar 20 Smoking counseling  - none needed Evidence for depression or other mood disorder - none significant Most recent labs reviewed. I have personally reviewed and have noted: 1) the patient's medical and social history 2) The patient's current medications and supplements 3) The patient's height, weight, and BMI have been recorded in the chart

## 2020-11-19 NOTE — Assessment & Plan Note (Signed)
Lab Results  Component Value Date   LDLCALC 56 11/06/2020   Stable, pt to continue current statin crestor

## 2020-11-25 ENCOUNTER — Other Ambulatory Visit (HOSPITAL_COMMUNITY): Payer: Self-pay

## 2020-12-18 ENCOUNTER — Other Ambulatory Visit (HOSPITAL_COMMUNITY): Payer: Self-pay

## 2020-12-18 MED FILL — Celecoxib Cap 200 MG: ORAL | 30 days supply | Qty: 60 | Fill #4 | Status: AC

## 2021-01-17 ENCOUNTER — Other Ambulatory Visit (HOSPITAL_COMMUNITY): Payer: Self-pay

## 2021-01-17 ENCOUNTER — Other Ambulatory Visit: Payer: Self-pay | Admitting: Orthopaedic Surgery

## 2021-01-17 MED ORDER — CELECOXIB 200 MG PO CAPS
200.0000 mg | ORAL_CAPSULE | Freq: Two times a day (BID) | ORAL | 12 refills | Status: DC
  Filled 2021-01-17: qty 60, 30d supply, fill #0
  Filled 2021-02-19: qty 60, 30d supply, fill #1
  Filled 2021-03-22: qty 60, 30d supply, fill #2
  Filled 2021-04-20: qty 60, 30d supply, fill #3
  Filled 2021-05-28: qty 60, 30d supply, fill #4
  Filled 2021-07-03: qty 60, 30d supply, fill #5
  Filled 2021-07-28: qty 60, 30d supply, fill #6

## 2021-01-17 MED ORDER — QUICKVUE AT-HOME COVID-19 TEST VI KIT
PACK | 0 refills | Status: DC
  Filled 2021-01-17: qty 2, 2d supply, fill #0
  Filled 2021-10-12: qty 2, fill #0

## 2021-01-17 MED ORDER — GABAPENTIN 100 MG PO CAPS
100.0000 mg | ORAL_CAPSULE | Freq: Three times a day (TID) | ORAL | 3 refills | Status: DC
  Filled 2021-01-17: qty 30, 5d supply, fill #0

## 2021-01-27 ENCOUNTER — Other Ambulatory Visit (HOSPITAL_COMMUNITY): Payer: Self-pay

## 2021-01-27 NOTE — Progress Notes (Signed)
H&P  Chief Complaint: Stress incontinence  History of Present Illness: Alisha Reed is a 60 y.o. year old female who returns for follow-up/management of stress urinary incontinence.  I saw her a few years ago.  At that time, it was recommended that she have physical therapy to initiate management of this.  Because of her schedule, she was unable to proceed with that.  She would like to get back in the system for physical therapy at this time.  She has a good urinary stream.  She does not always feel like she empties well.  She denies frequent urinary tract infections.  She goes through several pads a day.  She does not leak significantly at night.  She has had no recent gynecologic issues, but her gynecologist in [facility] has stated that she should have a transvaginal tape.  She would like to hold off on that for now.  Past Medical History:  Diagnosis Date   Arthritis    hands,    Carpal tunnel syndrome on both sides    both hands surgeries 01/2019   GERD (gastroesophageal reflux disease)    Hypercholesterolemia    Hypothyroidism    Osteopenia 11/08/2018   Thyroid disease     Past Surgical History:  Procedure Laterality Date   ABDOMINAL HYSTERECTOMY     BILATERAL CARPAL TUNNEL RELEASE Bilateral 01/18/2019   Procedure: BILATERAL CARPAL TUNNEL RELEASE;  Surgeon: Leandrew Koyanagi, MD;  Location: [facility];  Service: Orthopedics;  Laterality: Bilateral;   cholecystectomy     CHOLECYSTECTOMY     COLONOSCOPY  2011   FOOT SURGERY Bilateral    HYSTERECTOMY ABDOMINAL WITH SALPINGECTOMY     NASAL SINUS SURGERY      Home Medications:  (Not in a hospital admission)   Allergies: No Known Allergies  Family History  Problem Relation Age of Onset   Scleroderma Mother    Rheum arthritis Mother    Heart disease Mother    Heart disease Father    Glaucoma Father    Stomach cancer Maternal Grandmother    Heart attack Maternal Grandfather    Alzheimer's disease  Paternal Grandmother    Throat cancer Paternal Grandfather    Colon cancer Neg Hx    Liver cancer Neg Hx    Colon polyps Neg Hx    Esophageal cancer Neg Hx    Rectal cancer Neg Hx     Social History:  reports that she has never smoked. She has never used smokeless tobacco. She reports current alcohol use. She reports that she does not use drugs.  ROS: A complete review of systems was performed.  All systems are negative except for pertinent findings as noted.  Physical Exam:  Vital signs in last 24 hours: '@VSRANGES'$ @ General:  Alert and oriented, No acute distress HEENT: Normocephalic, atraumatic Neck: No JVD or lymphadenopathy Cardiovascular: Regular rate  Lungs: Normal inspiratory/expiratory excursion Extremities: No edema Neurologic: Grossly intact  I have reviewed prior pt notes  I have reviewed urinalysis results  Bladder scan revealed a residual urine volume of approximately 180 mL.  Impression/Assessment:  1.  Stress urinary incontinence, quite bothersome to the patient  2.  Incomplete bladder emptying  Plan:  1.  I will refer the patient for physical therapy for her stress urinary incontinence  2.  I have recommended that she double void at least twice a day  3.  Follow-up with me after she has her physical therapy visits  Jorja Loa 01/27/2021,  8:49 PM  Lillette Boxer. Marsalis Beaulieu MD

## 2021-01-28 ENCOUNTER — Other Ambulatory Visit: Payer: Self-pay

## 2021-01-28 ENCOUNTER — Encounter: Payer: Self-pay | Admitting: Urology

## 2021-01-28 ENCOUNTER — Ambulatory Visit: Payer: 59 | Admitting: Urology

## 2021-01-28 VITALS — BP 152/72 | HR 70 | Ht 62.0 in | Wt 148.0 lb

## 2021-01-28 DIAGNOSIS — R32 Unspecified urinary incontinence: Secondary | ICD-10-CM

## 2021-01-28 LAB — MICROSCOPIC EXAMINATION: Renal Epithel, UA: NONE SEEN /hpf

## 2021-01-28 LAB — URINALYSIS, ROUTINE W REFLEX MICROSCOPIC
Bilirubin, UA: NEGATIVE
Glucose, UA: NEGATIVE
Ketones, UA: NEGATIVE
Leukocytes,UA: NEGATIVE
Nitrite, UA: NEGATIVE
Protein,UA: NEGATIVE
Specific Gravity, UA: 1.01 (ref 1.005–1.030)
Urobilinogen, Ur: 0.2 mg/dL (ref 0.2–1.0)
pH, UA: 6.5 (ref 5.0–7.5)

## 2021-01-28 NOTE — Progress Notes (Signed)
Urological Symptom Review  Patient is experiencing the following symptoms: Leakage of urine   Review of Systems  Gastrointestinal (upper)  : Negative for upper GI symptoms  Gastrointestinal (lower) : Negative for lower GI symptoms  Constitutional : Negative for symptoms  Skin: Negative for skin symptoms  Eyes: Negative for eye symptoms  Ear/Nose/Throat : Negative for Ear/Nose/Throat symptoms  Hematologic/Lymphatic: Negative for Hematologic/Lymphatic symptoms  Cardiovascular : Negative for cardiovascular symptoms  Respiratory : Negative for respiratory symptoms  Endocrine: Negative for endocrine symptoms  Musculoskeletal: Joint pain  Neurological: Negative for neurological symptoms  Psychologic: Negative for psychiatric symptoms

## 2021-01-31 ENCOUNTER — Ambulatory Visit: Payer: Self-pay

## 2021-01-31 ENCOUNTER — Other Ambulatory Visit: Payer: Self-pay

## 2021-01-31 ENCOUNTER — Ambulatory Visit: Payer: 59 | Admitting: Orthopaedic Surgery

## 2021-01-31 ENCOUNTER — Encounter: Payer: Self-pay | Admitting: Orthopaedic Surgery

## 2021-01-31 DIAGNOSIS — M25511 Pain in right shoulder: Secondary | ICD-10-CM | POA: Diagnosis not present

## 2021-01-31 MED ORDER — BUPIVACAINE HCL 0.5 % IJ SOLN
3.0000 mL | INTRAMUSCULAR | Status: AC | PRN
  Administered 2021-01-31: 3 mL via INTRA_ARTICULAR

## 2021-01-31 MED ORDER — METHYLPREDNISOLONE ACETATE 40 MG/ML IJ SUSP
40.0000 mg | INTRAMUSCULAR | Status: AC | PRN
  Administered 2021-01-31: 40 mg via INTRA_ARTICULAR

## 2021-01-31 MED ORDER — LIDOCAINE HCL 1 % IJ SOLN
3.0000 mL | INTRAMUSCULAR | Status: AC | PRN
  Administered 2021-01-31: 3 mL

## 2021-01-31 NOTE — Progress Notes (Signed)
Office Visit Note   Patient: Alisha Reed           Date of Birth: Aug 31, 1960           MRN: [id number] Visit Date: 01/31/2021              Requested by: Biagio Borg, MD [address] [place],  [place] [address] PCP: Biagio Borg, MD   Assessment & Plan: Visit Diagnoses:  1. Acute pain of right shoulder     Plan: Impression is acute right shoulder pain status post tetanus injection.  I feel that she is presenting with subacromial bursitis which may be related to the injection.  Based on options we agreed to proceed with a subacromial injection today.  She had good relief during the anesthetic phase.  She will take it easy over the next couple weeks and then gradually increase activity as tolerated.  Follow-Up Instructions: No follow-ups on file.   Orders:  Orders Placed This Encounter  Procedures  . XR Shoulder Right   No orders of the defined types were placed in this encounter.     Procedures: Large Joint Inj: R subacromial bursa on 01/31/2021 8:48 PM Indications: pain Details: 22 G needle  Arthrogram: No  Medications: 3 mL lidocaine 1 %; 3 mL bupivacaine 0.5 %; 40 mg methylPREDNISolone acetate 40 MG/ML Outcome: tolerated well, no immediate complications Consent was given by the patient. Patient was prepped and draped in the usual sterile fashion.      Clinical Data: No additional findings.   Subjective: Chief Complaint  Patient presents with  . 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  Since then she has noticed shooting pains down the arm and decreased range of motion.  She has had difficulty sleeping due to the pain.  Denies any radicular symptoms.  Review of Systems   Objective: Vital Signs: There were no vitals taken for this visit.  Physical Exam  Ortho  Exam  Right shoulder shows full passive range of motion with mild to moderate pain with flexion external rotation internal rotation.  She has a fairly positive Hawkins and Neer sign.  Manual muscle testing of the rotator cuff is unremarkable.  Specialty Comments:  No specialty comments available.  Imaging: XR Shoulder Right  Result Date: 01/31/2021 No acute or structural abnormalities    PMFS History: Patient Active Problem List   Diagnosis Date Noted  . Stress incontinence 11/19/2020  . S/p bilateral carpal tunnel release 03/28/2019  . Chronic pain of right knee 12/29/2018  . Right carpal tunnel syndrome 12/29/2018  . Left carpal tunnel syndrome 12/29/2018  . Osteopenia 11/08/2018  . Pre-diabetes 05/02/2018  . Acute pain of right shoulder 03/18/2018  . Hyperlipidemia 11/04/2017  . Vitamin D deficiency 11/04/2017  . S/P cholecystectomy 08/02/2017  . Skier's thumb, right, subsequent encounter 03/15/2017  . Chronic left shoulder pain 02/08/2017  . Nondisplaced fracture of greater tuberosity of left humerus, initial encounter for closed fracture 01/18/2017  . Encounter for well adult exam with abnormal findings 09/24/2016  . Hypothyroidism 09/24/2016   Past Medical History:  Diagnosis Date  . Arthritis    hands,   . Carpal tunnel syndrome on both sides    both hands surgeries 01/2019  . GERD (gastroesophageal reflux disease)   .  Hypercholesterolemia   . Hypothyroidism   . Osteopenia 11/08/2018  . Thyroid disease     Family History  Problem Relation Age of Onset  . Scleroderma Mother   . Rheum arthritis Mother   . Heart disease Mother   . Heart disease Father   . Glaucoma Father   . Stomach cancer Maternal Grandmother   . Heart attack Maternal Grandfather   . Alzheimer's disease Paternal Grandmother   . Throat cancer Paternal Grandfather   . Colon cancer Neg Hx   . Liver cancer Neg Hx   . Colon polyps Neg Hx   . Esophageal cancer Neg Hx   . Rectal cancer Neg Hx      Past Surgical History:  Procedure Laterality Date  . ABDOMINAL HYSTERECTOMY    . BILATERAL CARPAL TUNNEL RELEASE Bilateral 01/18/2019   Procedure: BILATERAL CARPAL TUNNEL RELEASE;  Surgeon: Leandrew Koyanagi, MD;  Location: [facility];  Service: Orthopedics;  Laterality: Bilateral;  . cholecystectomy    . CHOLECYSTECTOMY    . COLONOSCOPY  2011  . FOOT SURGERY Bilateral   . HYSTERECTOMY ABDOMINAL WITH SALPINGECTOMY    . NASAL SINUS SURGERY     Social History   Occupational History  . Occupation: Therapist, sports  Tobacco Use  . Smoking status: Never  . Smokeless tobacco: Never  Vaping Use  . Vaping Use: Never used  Substance and Sexual Activity  . Alcohol use: Yes    Comment: social  . Drug use: No  . Sexual activity: Yes    Birth control/protection: Surgical

## 2021-02-03 ENCOUNTER — Other Ambulatory Visit (HOSPITAL_COMMUNITY): Payer: Self-pay

## 2021-02-05 ENCOUNTER — Other Ambulatory Visit (HOSPITAL_COMMUNITY): Payer: Self-pay

## 2021-02-11 ENCOUNTER — Other Ambulatory Visit (HOSPITAL_COMMUNITY): Payer: Self-pay

## 2021-02-11 ENCOUNTER — Other Ambulatory Visit: Payer: Self-pay

## 2021-02-11 MED ORDER — ESTRADIOL 1 MG PO TABS
1.0000 mg | ORAL_TABLET | Freq: Every day | ORAL | 4 refills | Status: DC
  Filled 2021-02-11: qty 90, 90d supply, fill #0

## 2021-02-19 ENCOUNTER — Other Ambulatory Visit (HOSPITAL_COMMUNITY): Payer: Self-pay

## 2021-02-28 DIAGNOSIS — D2371 Other benign neoplasm of skin of right lower limb, including hip: Secondary | ICD-10-CM | POA: Diagnosis not present

## 2021-02-28 DIAGNOSIS — L578 Other skin changes due to chronic exposure to nonionizing radiation: Secondary | ICD-10-CM | POA: Diagnosis not present

## 2021-02-28 DIAGNOSIS — D225 Melanocytic nevi of trunk: Secondary | ICD-10-CM | POA: Diagnosis not present

## 2021-02-28 DIAGNOSIS — L821 Other seborrheic keratosis: Secondary | ICD-10-CM | POA: Diagnosis not present

## 2021-02-28 DIAGNOSIS — D2372 Other benign neoplasm of skin of left lower limb, including hip: Secondary | ICD-10-CM | POA: Diagnosis not present

## 2021-02-28 DIAGNOSIS — Z808 Family history of malignant neoplasm of other organs or systems: Secondary | ICD-10-CM | POA: Diagnosis not present

## 2021-03-06 ENCOUNTER — Other Ambulatory Visit: Payer: Self-pay

## 2021-03-06 ENCOUNTER — Ambulatory Visit (HOSPITAL_COMMUNITY): Payer: 59 | Attending: Urology | Admitting: Physical Therapy

## 2021-03-06 ENCOUNTER — Encounter (HOSPITAL_COMMUNITY): Payer: Self-pay | Admitting: Physical Therapy

## 2021-03-06 DIAGNOSIS — M6281 Muscle weakness (generalized): Secondary | ICD-10-CM | POA: Insufficient documentation

## 2021-03-06 DIAGNOSIS — N3946 Mixed incontinence: Secondary | ICD-10-CM | POA: Diagnosis not present

## 2021-03-06 NOTE — Therapy (Signed)
Alisha Reed, [place], [address] Phone: [phone]   Fax:  [phone]  Physical Therapy Evaluation  Patient Details  Name: Alisha Reed MRN: [id number] Date of Birth: 06-19-1960 Referring Provider (PT): Alisha Reed   Encounter Date: 03/06/2021   PT End of Session - 03/06/21 1044     Visit Number 1    Number of Visits 4    Date for PT Re-Evaluation 04/05/21    Authorization Type UMR    Authorization - Number of Visits 30    Progress Note Due on Visit 4    PT Start Time 1005    PT Stop Time 1050    PT Time Calculation (min) 45 min    Activity Tolerance Patient tolerated treatment well    Behavior During Therapy [facility] for tasks assessed/performed             Past Medical History:  Diagnosis Date   Arthritis    hands,    Carpal tunnel syndrome on both sides    both hands surgeries 01/2019   GERD (gastroesophageal reflux disease)    Hypercholesterolemia    Hypothyroidism    Osteopenia 11/08/2018   Thyroid disease     Past Surgical History:  Procedure Laterality Date   ABDOMINAL HYSTERECTOMY     BILATERAL CARPAL TUNNEL RELEASE Bilateral 01/18/2019   Procedure: BILATERAL CARPAL TUNNEL RELEASE;  Surgeon: Alisha Koyanagi, MD;  Location: [place];  Service: Orthopedics;  Laterality: Bilateral;   cholecystectomy     CHOLECYSTECTOMY     COLONOSCOPY  2011   FOOT SURGERY Bilateral    HYSTERECTOMY ABDOMINAL WITH SALPINGECTOMY     NASAL SINUS SURGERY      There were no vitals filed for this visit.    Subjective Assessment - 03/06/21 1009     Subjective She had a huge fibroid on her uterus and had a hysterectomy when she was 20 which helped her incontinence but then it started back up approximately five years ago with progression in the past two years.    Currently in Pain? No/denies                [facility] PT Assessment - 03/06/21 0001       Assessment   Medical Diagnosis  urinary incontinence    Referring Provider (PT) Alisha Reed    Onset Date/Surgical Date --   5 years ago   Prior Therapy none      Precautions   Precautions None      Restrictions   Weight Bearing Restrictions No      Cognition   Overall Cognitive Status Within Functional Limits for tasks assessed                        Objective measurements completed on examination: See above findings.     Pelvic Floor Special Questions - 03/06/21 0001     Prior Urinalysis No    Diagnostic Test/Procedure Performed No    Are you Pregnant or attempting pregnancy? No    Prior Pregnancies Yes    Number of Pregnancies 2    Number of Vaginal Deliveries 2    Any difficulty with labor and deliveries Yes    Episiotomy Performed Yes    Urinary Leakage Yes    How often 12    Pad use 8/day    Activities that cause leaking With strong urge;Coughing;Sneezing;Laughing;Lifting;Bending;Walking;Exercising;Running    Urinary urgency Yes  Urinary frequency every four hours    Fluid intake when working less than 8 oz due to being scarred of leaking    Caffeine beverages 3/day    Falling out feeling (prolapse) No              [facility] Adult PT Treatment/Exercise - 03/06/21 0001       Exercises   Exercises Lumbar      Lumbar Exercises: Seated   Other Seated Lumbar Exercises kegal long hold 5" x 6; quick flick x 10, abdominal set x 10                     PT Education - 03/06/21 1043     Education Details HEP    Person(s) Educated Patient    Methods Explanation;Verbal cues;Tactile cues;Handout    Comprehension Returned demonstration;Verbalized understanding              PT Short Term Goals - 03/06/21 1055       PT SHORT TERM GOAL #1   Title Pt to be I in HEP to imporve core strength to note that she is only using 6 pads a day    Time 2    Period Weeks    Status New               PT Long Term Goals - 03/06/21 1056       PT LONG TERM GOAL #1    Title PT to be I in and advance HEP to note that she is only having to use 4 pads per day.                    Plan - 03/06/21 1046     Clinical Impression Statement Alisha Reed is a 60 yo female who has been experiencing incontinence for the past five years, the last two years have been progressive.  She is up to wearing 8 pads a day and has been referred to skilled PT for incontinence.  Exam demonstrates weakened pelvic and abdominal mm, decreased endurance.  Alisha Reed will benefit from skilled PT to address these issues and improve her continence.    Personal Factors and Comorbidities Time since onset of injury/illness/exacerbation    Examination-Activity Limitations Toileting;Continence;Dressing    Examination-Participation Restrictions Community Activity;Occupation;Yard Work;Shop    Stability/Clinical Decision Making Stable/Uncomplicated    Clinical Decision Making Low    Rehab Potential Good    PT Frequency 1x / week    PT Duration 4 weeks    PT Treatment/Interventions Patient/family education;Therapeutic exercise;Therapeutic activities    PT Next Visit Plan begin all four ab set, supine heelslide, sidelying abset, increase long holds    PT Home Exercise Plan kegals, ab set             Patient will benefit from skilled therapeutic intervention in order to improve the following deficits and impairments:  Decreased strength  Visit Diagnosis: Muscle weakness (generalized) - Plan: PT plan of care cert/re-cert  Mixed incontinence - Plan: PT plan of care cert/re-cert     Problem List Patient Active Problem List   Diagnosis Date Noted   Stress incontinence 11/19/2020   S/p bilateral carpal tunnel release 03/28/2019   Chronic pain of right knee 12/29/2018   Right carpal tunnel syndrome 12/29/2018   Left carpal tunnel syndrome 12/29/2018   Osteopenia 11/08/2018   Pre-diabetes 05/02/2018   Acute pain of right shoulder 03/18/2018   Hyperlipidemia 11/04/2017    Vitamin  D deficiency 11/04/2017   S/P cholecystectomy 08/02/2017   Skier's thumb, right, subsequent encounter 03/15/2017   Chronic left shoulder pain 02/08/2017   Nondisplaced fracture of greater tuberosity of left humerus, initial encounter for closed fracture 01/18/2017   Encounter for well adult exam with abnormal findings 09/24/2016   Hypothyroidism 09/24/2016   Alisha Reed, PT CLT [phone]  03/06/2021, 11:01 AM  [place], [place], [address] Phone: [phone]   Fax:  [phone]  Name: Alisha Reed MRN: [id number] Date of Birth: [birth year]

## 2021-03-24 ENCOUNTER — Other Ambulatory Visit (HOSPITAL_COMMUNITY): Payer: Self-pay

## 2021-03-25 ENCOUNTER — Other Ambulatory Visit: Payer: Self-pay

## 2021-03-25 ENCOUNTER — Ambulatory Visit (HOSPITAL_COMMUNITY): Payer: 59 | Attending: Urology | Admitting: Physical Therapy

## 2021-03-25 DIAGNOSIS — N3946 Mixed incontinence: Secondary | ICD-10-CM | POA: Diagnosis not present

## 2021-03-25 DIAGNOSIS — M6281 Muscle weakness (generalized): Secondary | ICD-10-CM | POA: Diagnosis not present

## 2021-03-25 NOTE — Therapy (Signed)
[place], [place], [address] Phone: [phone]   Fax:  [phone]  Physical Therapy Treatment  Patient Details  Name: Alisha Reed MRN: [id number] Date of Birth: 07-26-1960 Referring Provider (PT): Franchot Gallo   Encounter Date: 03/25/2021   PT End of Session - 03/25/21 1604     Visit Number 2    Number of Visits 4    Date for PT Re-Evaluation 04/05/21    Authorization Type UMR    Authorization - Number of Visits 30    Progress Note Due on Visit 4    PT Start Time 7564    PT Stop Time 1610    PT Time Calculation (min) 40 min    Activity Tolerance Patient tolerated treatment well    Behavior During Therapy [facility] for tasks assessed/performed             Past Medical History:  Diagnosis Date   Arthritis    hands,    Carpal tunnel syndrome on both sides    both hands surgeries 01/2019   GERD (gastroesophageal reflux disease)    Hypercholesterolemia    Hypothyroidism    Osteopenia 11/08/2018   Thyroid disease     Past Surgical History:  Procedure Laterality Date   ABDOMINAL HYSTERECTOMY     BILATERAL CARPAL TUNNEL RELEASE Bilateral 01/18/2019   Procedure: BILATERAL CARPAL TUNNEL RELEASE;  Surgeon: Leandrew Koyanagi, MD;  Location: [place];  Service: Orthopedics;  Laterality: Bilateral;   cholecystectomy     CHOLECYSTECTOMY     COLONOSCOPY  2011   FOOT SURGERY Bilateral    HYSTERECTOMY ABDOMINAL WITH SALPINGECTOMY     NASAL SINUS SURGERY      There were no vitals filed for this visit.   Subjective Assessment - 03/25/21 1529     Subjective Pt states that she has been doing her exercises and she can tell that she is not leaking as badly.    Currently in Pain? No/denies                          [facility] Adult PT Treatment/Exercise - 03/25/21 0001       Exercises   Exercises Lumbar      Lumbar Exercises: Seated   Other Seated Lumbar Exercises kegal hold 15"  rest 30";      Lumbar Exercises: Supine   Heel Slides 10 reps    Other Supine Lumbar Exercises head and chest lift      Lumbar Exercises: Sidelying   Hip Abduction 10 reps;Both      Lumbar Exercises: Quadruped   Straight Leg Raise 10 reps                       PT Short Term Goals - 03/25/21 1602       PT SHORT TERM GOAL #1   Title Pt to be I in HEP to imporve core strength to note that she is only using 6 pads a day    Time 2    Period Weeks    Status Partially Met               PT Long Term Goals - 03/25/21 1603       PT LONG TERM GOAL #1   Title PT to be I in and advance HEP to note that she is only having to use 4 pads per day.  Time 6    Status On-going                   Plan - 03/25/21 1610     Clinical Impression Statement Reviewed goals with patient.  Pt is able to complete a 15" kegal as opposed to the 5" kegal at evaluation.  Continues to have episodes of incontinence but can tell that she is not losing as much urine when she is incontinent.    Personal Factors and Comorbidities Time since onset of injury/illness/exacerbation    Examination-Activity Limitations Toileting;Continence;Dressing    Examination-Participation Restrictions Community Activity;Occupation;Yard Work;Shop    Stability/Clinical Decision Making Stable/Uncomplicated    Rehab Potential Good    PT Frequency 1x / week    PT Duration 4 weeks    PT Treatment/Interventions Patient/family education;Therapeutic exercise;Therapeutic activities    PT Next Visit Plan begin t band exercises, advance kegals    PT Home Exercise Plan kegals, ab set; supine heelslides and crunches, sidelying abduction all 4 hip extension.             Patient will benefit from skilled therapeutic intervention in order to improve the following deficits and impairments:  Decreased strength  Visit Diagnosis: Mixed incontinence  Muscle weakness (generalized)     Problem List Patient  Active Problem List   Diagnosis Date Noted   Stress incontinence 11/19/2020   S/p bilateral carpal tunnel release 03/28/2019   Chronic pain of right knee 12/29/2018   Right carpal tunnel syndrome 12/29/2018   Left carpal tunnel syndrome 12/29/2018   Osteopenia 11/08/2018   Pre-diabetes 05/02/2018   Acute pain of right shoulder 03/18/2018   Hyperlipidemia 11/04/2017   Vitamin D deficiency 11/04/2017   S/P cholecystectomy 08/02/2017   Skier's thumb, right, subsequent encounter 03/15/2017   Chronic left shoulder pain 02/08/2017   Nondisplaced fracture of greater tuberosity of left humerus, initial encounter for closed fracture 01/18/2017   Encounter for well adult exam with abnormal findings 09/24/2016   Hypothyroidism 09/24/2016  Rayetta Humphrey, PT CLT [phone]  03/25/2021, 4:14 PM  [place], [place], [address] Phone: [phone]   Fax:  [phone]  Name: Marliyah Reid MRN: [id number] Date of Birth: 1960-08-26

## 2021-03-31 ENCOUNTER — Other Ambulatory Visit (HOSPITAL_COMMUNITY): Payer: Self-pay

## 2021-04-01 ENCOUNTER — Encounter (HOSPITAL_COMMUNITY): Payer: Self-pay | Admitting: Physical Therapy

## 2021-04-01 ENCOUNTER — Ambulatory Visit (HOSPITAL_COMMUNITY): Payer: 59 | Admitting: Physical Therapy

## 2021-04-01 ENCOUNTER — Other Ambulatory Visit: Payer: Self-pay

## 2021-04-01 DIAGNOSIS — M6281 Muscle weakness (generalized): Secondary | ICD-10-CM

## 2021-04-01 DIAGNOSIS — N3946 Mixed incontinence: Secondary | ICD-10-CM | POA: Diagnosis not present

## 2021-04-01 NOTE — Therapy (Signed)
[place], [place], [address] Phone: [phone]   Fax:  [phone]  Physical Therapy Treatment  Patient Details  Name: Alisha Reed MRN: [id number] Date of Birth: [birth year] Referring Provider (PT): Franchot Gallo   Encounter Date: 04/01/2021   PT End of Session - 04/01/21 1633     Visit Number 3    Number of Visits 4    Date for PT Re-Evaluation 04/05/21    Authorization Type UMR    Authorization - Number of Visits 30    Progress Note Due on Visit 4    PT Start Time 1536    PT Stop Time 1615    PT Time Calculation (min) 39 min    Activity Tolerance Patient tolerated treatment well    Behavior During Therapy [facility] for tasks assessed/performed             Past Medical History:  Diagnosis Date   Arthritis    hands,    Carpal tunnel syndrome on both sides    both hands surgeries 01/2019   GERD (gastroesophageal reflux disease)    Hypercholesterolemia    Hypothyroidism    Osteopenia 11/08/2018   Thyroid disease     Past Surgical History:  Procedure Laterality Date   ABDOMINAL HYSTERECTOMY     BILATERAL CARPAL TUNNEL RELEASE Bilateral 01/18/2019   Procedure: BILATERAL CARPAL TUNNEL RELEASE;  Surgeon: Leandrew Koyanagi, MD;  Location: [place];  Service: Orthopedics;  Laterality: Bilateral;   cholecystectomy     CHOLECYSTECTOMY     COLONOSCOPY  2011   FOOT SURGERY Bilateral    HYSTERECTOMY ABDOMINAL WITH SALPINGECTOMY     NASAL SINUS SURGERY      There were no vitals filed for this visit.   Subjective Assessment - 04/01/21 1538     Subjective Pt states that she continues to complete her  her exercises and she can tell that she is not leaking as badly.  States she was unable to be relieved at work today and did not leak which really surprised her; she is still having leakage with lifting.    Currently in Pain? No/denies                    [facility] Adult PT  Treatment/Exercise - 04/01/21 0001       Exercises   Exercises Lumbar      Lumbar Exercises: Standing   Shoulder Extension Both;Strengthening;10 reps    Theraband Level (Shoulder Extension) Level 3 (Green)    Shoulder Extension Limitations with abdominal set    Theraband Level (Shoulder Adduction) Level 3 (Green)    Shoulder Adduction Limitations 10    Other Standing Lumbar Exercises Shld flextion Rt then Lt with t band x 10; RT flex to Lt hip ; LT flexion to Rt hip x 10      Lumbar Exercises: Seated   Other Seated Lumbar Exercises kegal 20"  40"      Lumbar Exercises: Supine   Ab Set 10 reps    Other Supine Lumbar Exercises head and chest lift x 20                       PT Short Term Goals - 04/01/21 1639       PT SHORT TERM GOAL #1   Title Pt to be I in HEP to imporve core strength to note that she is only using 6 pads a day  Time 2    Period Weeks    Status Achieved               PT Long Term Goals - 04/01/21 1639       PT LONG TERM GOAL #1   Title PT to be I in and advance HEP to note that she is only having to use 4 pads per day.    Time 6    Period Weeks    Status On-going                   Plan - 04/01/21 1636     Clinical Impression Statement PT improved with incontinence.  Able to complete a 20"kegal now.  PT educated in t-band exercies and reviewed current HEP without questions.  PT will complete exercises and come back for a 4 week follow up to see how she is imporving and answer any questions.  Pt has increased shoulder pain when doing all four SLR therefore changed to prone.    Personal Factors and Comorbidities Time since onset of injury/illness/exacerbation    Examination-Activity Limitations Toileting;Continence;Dressing    Examination-Participation Restrictions Community Activity;Occupation;Yard Work;Shop    Stability/Clinical Decision Making Stable/Uncomplicated    Rehab Potential Good    PT Frequency 1x / week    PT  Duration 4 weeks    PT Treatment/Interventions Patient/family education;Therapeutic exercise;Therapeutic activities    PT Next Visit Plan Answer any questions pt may have and discharge.    PT Home Exercise Plan kegals, ab set; supine heelslides and crunches, sidelying abduction all 4 hip extension.; 11/22; Tband exercises all, prone SLR             Patient will benefit from skilled therapeutic intervention in order to improve the following deficits and impairments:  Decreased strength  Visit Diagnosis: Mixed incontinence  Muscle weakness (generalized)     Problem List Patient Active Problem List   Diagnosis Date Noted   Stress incontinence 11/19/2020   S/p bilateral carpal tunnel release 03/28/2019   Chronic pain of right knee 12/29/2018   Right carpal tunnel syndrome 12/29/2018   Left carpal tunnel syndrome 12/29/2018   Osteopenia 11/08/2018   Pre-diabetes 05/02/2018   Acute pain of right shoulder 03/18/2018   Hyperlipidemia 11/04/2017   Vitamin D deficiency 11/04/2017   S/P cholecystectomy 08/02/2017   Skier's thumb, right, subsequent encounter 03/15/2017   Chronic left shoulder pain 02/08/2017   Nondisplaced fracture of greater tuberosity of left humerus, initial encounter for closed fracture 01/18/2017   Encounter for well adult exam with abnormal findings 09/24/2016   Hypothyroidism 09/24/2016   Rayetta Humphrey, PT CLT [phone]  04/01/2021, 4:40 PM  [place], [place], [address] Phone: [phone]   Fax:  [phone]  Name: Alisha Reed MRN: [id number] Date of Birth: [birth year]

## 2021-04-07 ENCOUNTER — Other Ambulatory Visit: Payer: Self-pay | Admitting: Physician Assistant

## 2021-04-07 ENCOUNTER — Other Ambulatory Visit (HOSPITAL_COMMUNITY): Payer: Self-pay

## 2021-04-07 ENCOUNTER — Encounter: Payer: Self-pay | Admitting: Orthopaedic Surgery

## 2021-04-07 ENCOUNTER — Telehealth: Payer: Self-pay

## 2021-04-07 MED ORDER — TRAMADOL HCL 50 MG PO TABS: 50.0000 mg | ORAL_TABLET | Freq: Three times a day (TID) | ORAL | 2 refills | Status: DC | PRN

## 2021-04-07 MED ORDER — TRAMADOL HCL 50 MG PO TABS
50.0000 mg | ORAL_TABLET | Freq: Three times a day (TID) | ORAL | 0 refills | Status: DC | PRN
  Filled 2021-04-07: qty 40, 7d supply, fill #0

## 2021-04-07 NOTE — Telephone Encounter (Signed)
VOB submitted for  Durolane, right knee. °BV pending. °

## 2021-04-07 NOTE — Telephone Encounter (Signed)
Noted  

## 2021-04-07 NOTE — Telephone Encounter (Signed)
I think the last day to submit approval for gel inj for this year is on 12/6. May try and go ahead and submit so she does not miss out on cutoff as long as she had good relief from gel injection she had several months ago.  If she has had better relief from cortisone, we can go ahead and do one of those while awaiting approval on gel?  I have gone ahead and sent in [facility]ol [facility]

## 2021-04-09 ENCOUNTER — Telehealth: Payer: Self-pay

## 2021-04-09 NOTE — Telephone Encounter (Signed)
Approved for Durolane, right knee. Buy & Bill Covered at 100% of the allowable amount. Co-pay of $50.00 No PA required  Appt. 04/17/2021 with Dr. Erlinda Hong

## 2021-04-14 ENCOUNTER — Other Ambulatory Visit (HOSPITAL_COMMUNITY): Payer: Self-pay

## 2021-04-14 DIAGNOSIS — Z01419 Encounter for gynecological examination (general) (routine) without abnormal findings: Secondary | ICD-10-CM | POA: Diagnosis not present

## 2021-04-14 DIAGNOSIS — Z719 Counseling, unspecified: Secondary | ICD-10-CM | POA: Diagnosis not present

## 2021-04-14 DIAGNOSIS — E663 Overweight: Secondary | ICD-10-CM | POA: Diagnosis not present

## 2021-04-14 DIAGNOSIS — N951 Menopausal and female climacteric states: Secondary | ICD-10-CM | POA: Diagnosis not present

## 2021-04-14 DIAGNOSIS — Z1231 Encounter for screening mammogram for malignant neoplasm of breast: Secondary | ICD-10-CM | POA: Diagnosis not present

## 2021-04-14 MED ORDER — ESTRADIOL 1 MG PO TABS
1.0000 mg | ORAL_TABLET | Freq: Every day | ORAL | 4 refills | Status: DC
  Filled 2021-04-14 – 2021-05-13 (×2): qty 90, 90d supply, fill #0
  Filled 2021-07-28: qty 90, 90d supply, fill #1
  Filled 2021-11-02: qty 90, 90d supply, fill #2

## 2021-04-17 ENCOUNTER — Ambulatory Visit: Payer: 59 | Admitting: Orthopaedic Surgery

## 2021-04-17 ENCOUNTER — Encounter: Payer: Self-pay | Admitting: Orthopaedic Surgery

## 2021-04-17 ENCOUNTER — Other Ambulatory Visit: Payer: Self-pay

## 2021-04-17 DIAGNOSIS — M1711 Unilateral primary osteoarthritis, right knee: Secondary | ICD-10-CM | POA: Diagnosis not present

## 2021-04-17 MED ORDER — LIDOCAINE HCL 1 % IJ SOLN
5.0000 mL | INTRAMUSCULAR | Status: AC | PRN
  Administered 2021-04-17: 5 mL

## 2021-04-17 MED ORDER — SODIUM HYALURONATE 60 MG/3ML IX PRSY
60.0000 mg | PREFILLED_SYRINGE | INTRA_ARTICULAR | Status: AC | PRN
Start: 2021-04-17 — End: 2021-04-17
  Administered 2021-04-17: 60 mg via INTRA_ARTICULAR

## 2021-04-17 NOTE — Progress Notes (Signed)
Office Visit Note   Patient: Alisha Reed           Date of Birth: 08/30/1960           MRN: [id number] Visit Date: 04/17/2021              Requested by: Biagio Borg, MD [address] [place],  [place] [address] PCP: Biagio Borg, MD   Assessment & Plan: Visit Diagnoses:  1. Primary osteoarthritis of right knee     Plan: Alisha Reed is here today for right knee durolane injection.  Tolerated well.    Follow-Up Instructions: No follow-ups on file.   Orders:  No orders of the defined types were placed in this encounter.  No orders of the defined types were placed in this encounter.     Procedures: Large Joint Inj: R knee on 04/17/2021 10:12 AM Indications: pain Details: 22 G needle  Arthrogram: No  Medications: 5 mL lidocaine 1 %; 60 mg Sodium Hyaluronate 60 MG/3ML Outcome: tolerated well, no immediate complications Patient was prepped and draped in the usual sterile fashion.      Clinical Data: No additional findings.   Subjective: Chief Complaint  Patient presents with   Right Knee - Follow-up    Durolane    HPI  Review of Systems   Objective: Vital Signs: There were no vitals taken for this visit.  Physical Exam  Ortho Exam  Specialty Comments:  No specialty comments available.  Imaging: No results found.   PMFS History: Patient Active Problem List   Diagnosis Date Noted   Stress incontinence 11/19/2020   S/p bilateral carpal tunnel release 03/28/2019   Chronic pain of right knee 12/29/2018   Right carpal tunnel syndrome 12/29/2018   Left carpal tunnel syndrome 12/29/2018   Osteopenia 11/08/2018   Pre-diabetes 05/02/2018   Acute pain of right shoulder 03/18/2018   Hyperlipidemia 11/04/2017   Vitamin D deficiency 11/04/2017   S/P cholecystectomy 08/02/2017   Skier's thumb, right, subsequent encounter 03/15/2017   Chronic left shoulder pain 02/08/2017   Nondisplaced fracture of greater tuberosity of left humerus, initial encounter  for closed fracture 01/18/2017   Encounter for well adult exam with abnormal findings 09/24/2016   Hypothyroidism 09/24/2016   Past Medical History:  Diagnosis Date   Arthritis    hands,    Carpal tunnel syndrome on both sides    both hands surgeries 01/2019   GERD (gastroesophageal reflux disease)    Hypercholesterolemia    Hypothyroidism    Osteopenia 11/08/2018   Thyroid disease     Family History  Problem Relation Age of Onset   Scleroderma Mother    Rheum arthritis Mother    Heart disease Mother    Heart disease Father    Glaucoma Father    Stomach cancer Maternal Grandmother    Heart attack Maternal Grandfather    Alzheimer's disease Paternal Grandmother    Throat cancer Paternal Grandfather    Colon cancer Neg Hx    Liver cancer Neg Hx    Colon polyps Neg Hx    Esophageal cancer Neg Hx    Rectal cancer Neg Hx     Past Surgical History:  Procedure Laterality Date   ABDOMINAL HYSTERECTOMY     BILATERAL CARPAL TUNNEL RELEASE Bilateral 01/18/2019   Procedure: BILATERAL CARPAL TUNNEL RELEASE;  Surgeon: Leandrew Koyanagi, MD;  Location: [place];  Service: Orthopedics;  Laterality: Bilateral;   cholecystectomy     CHOLECYSTECTOMY  COLONOSCOPY  2011   FOOT SURGERY Bilateral    HYSTERECTOMY ABDOMINAL WITH SALPINGECTOMY     NASAL SINUS SURGERY     Social History   Occupational History   Occupation: Therapist, sports  Tobacco Use   Smoking status: Never   Smokeless tobacco: Never  Vaping Use   Vaping Use: Never used  Substance and Sexual Activity   Alcohol use: Yes    Comment: social   Drug use: No   Sexual activity: Yes    Birth control/protection: Surgical

## 2021-04-20 ENCOUNTER — Other Ambulatory Visit: Payer: Self-pay | Admitting: Physician Assistant

## 2021-04-20 MED ORDER — HYDROCODONE-ACETAMINOPHEN 5-325 MG PO TABS
1.0000 | ORAL_TABLET | Freq: Three times a day (TID) | ORAL | 0 refills | Status: DC | PRN
Start: 2021-04-20 — End: 2021-04-27

## 2021-04-21 ENCOUNTER — Telehealth: Payer: Self-pay | Admitting: Physician Assistant

## 2021-04-21 ENCOUNTER — Other Ambulatory Visit (HOSPITAL_COMMUNITY): Payer: Self-pay

## 2021-04-21 NOTE — Telephone Encounter (Signed)
Ok thanks!  She knows to come at 72

## 2021-04-21 NOTE — Telephone Encounter (Signed)
Called patient no answer LMOM. Added her to the schedule for tomorrow.

## 2021-04-21 NOTE — Telephone Encounter (Signed)
Can you please have her come in tomorrow morning at 815

## 2021-04-22 ENCOUNTER — Ambulatory Visit: Payer: 59 | Admitting: Orthopaedic Surgery

## 2021-04-22 ENCOUNTER — Other Ambulatory Visit: Payer: Self-pay

## 2021-04-22 ENCOUNTER — Encounter: Payer: Self-pay | Admitting: Orthopaedic Surgery

## 2021-04-22 ENCOUNTER — Other Ambulatory Visit (HOSPITAL_COMMUNITY): Payer: Self-pay

## 2021-04-22 DIAGNOSIS — M1711 Unilateral primary osteoarthritis, right knee: Secondary | ICD-10-CM | POA: Diagnosis not present

## 2021-04-22 MED ORDER — LIDOCAINE HCL 1 % IJ SOLN
2.0000 mL | INTRAMUSCULAR | Status: AC | PRN
Start: 2021-04-22 — End: 2021-04-22
  Administered 2021-04-22: 2 mL

## 2021-04-22 MED ORDER — BUPIVACAINE HCL 0.5 % IJ SOLN
2.0000 mL | INTRAMUSCULAR | Status: AC | PRN
  Administered 2021-04-22: 2 mL via INTRA_ARTICULAR

## 2021-04-22 MED ORDER — METHYLPREDNISOLONE ACETATE 40 MG/ML IJ SUSP
40.0000 mg | INTRAMUSCULAR | Status: AC | PRN
Start: 2021-04-22 — End: 2021-04-22
  Administered 2021-04-22: 40 mg via INTRA_ARTICULAR

## 2021-04-22 MED ORDER — KETOROLAC TROMETHAMINE 10 MG PO TABS
10.0000 mg | ORAL_TABLET | Freq: Two times a day (BID) | ORAL | 0 refills | Status: DC | PRN
  Filled 2021-04-22: qty 10, 5d supply, fill #0

## 2021-04-22 NOTE — Progress Notes (Signed)
Office Visit Note   Patient: Alisha Reed           Date of Birth: July 09, 1960           MRN: [id number] Visit Date: 04/22/2021              Requested by: Biagio Borg, MD [address] [place],  [place] [address] PCP: Biagio Borg, MD   Assessment & Plan: Visit Diagnoses:  1. Primary osteoarthritis of right knee     Plan: [name] comes in today for worsening right knee pain status post Visco injection last week.  She did find the day of the injection but the next day she developed severe pain.  Denies any constitutional symptoms.  She does feel like there is some swelling.  Denies any joint effusion.  She endorses start up stiffness and pain and feels like it is on fire.  Right knee shows no joint effusion.  She is able to tolerate range of motion from 0 to 90 degrees without significant discomfort.  Impression is post Visco injection flareup.  Based on options [name] elected to undergo cortisone injection today which will hopefully calm things down.  I will send in a prescription for Toradol to take as needed.  Letter write her out of work for the rest of the week.  Follow-Up Instructions: No follow-ups on file.   Orders:  No orders of the defined types were placed in this encounter.  Meds ordered this encounter  Medications   ketorolac (TORADOL) 10 MG tablet    Sig: Take 1 tablet (10 mg total) by mouth 2 (two) times daily as needed.    Dispense:  10 tablet    Refill:  0      Procedures: Large Joint Inj: R knee on 04/22/2021 8:19 AM Indications: pain Details: 22 G needle  Arthrogram: No  Medications: 40 mg methylPREDNISolone acetate 40 MG/ML; 2 mL lidocaine 1 %; 2 mL bupivacaine 0.5 % Consent was given by the patient. Patient was prepped and draped in the usual sterile fashion.      Clinical Data: No additional findings.   Subjective: Chief Complaint  Patient presents with   Right Knee - Pain    HPI  Review of Systems   Objective: Vital Signs:  There were no vitals taken for this visit.  Physical Exam  Ortho Exam  Specialty Comments:  No specialty comments available.  Imaging: No results found.   PMFS History: Patient Active Problem List   Diagnosis Date Noted   Stress incontinence 11/19/2020   S/p bilateral carpal tunnel release 03/28/2019   Chronic pain of right knee 12/29/2018   Right carpal tunnel syndrome 12/29/2018   Left carpal tunnel syndrome 12/29/2018   Osteopenia 11/08/2018   Pre-diabetes 05/02/2018   Acute pain of right shoulder 03/18/2018   Hyperlipidemia 11/04/2017   Vitamin D deficiency 11/04/2017   S/P cholecystectomy 08/02/2017   Skier's thumb, right, subsequent encounter 03/15/2017   Chronic left shoulder pain 02/08/2017   Nondisplaced fracture of greater tuberosity of left humerus, initial encounter for closed fracture 01/18/2017   Encounter for well adult exam with abnormal findings 09/24/2016   Hypothyroidism 09/24/2016   Past Medical History:  Diagnosis Date   Arthritis    hands,    Carpal tunnel syndrome on both sides    both hands surgeries 01/2019   GERD (gastroesophageal reflux disease)    Hypercholesterolemia    Hypothyroidism    Osteopenia 11/08/2018   Thyroid disease  Family History  Problem Relation Age of Onset   Scleroderma Mother    Rheum arthritis Mother    Heart disease Mother    Heart disease Father    Glaucoma Father    Stomach cancer Maternal Grandmother    Heart attack Maternal Grandfather    Alzheimer's disease Paternal Grandmother    Throat cancer Paternal Grandfather    Colon cancer Neg Hx    Liver cancer Neg Hx    Colon polyps Neg Hx    Esophageal cancer Neg Hx    Rectal cancer Neg Hx     Past Surgical History:  Procedure Laterality Date   ABDOMINAL HYSTERECTOMY     BILATERAL CARPAL TUNNEL RELEASE Bilateral 01/18/2019   Procedure: BILATERAL CARPAL TUNNEL RELEASE;  Surgeon: Leandrew Koyanagi, MD;  Location: [facility];  Service:  Orthopedics;  Laterality: Bilateral;   cholecystectomy     CHOLECYSTECTOMY     COLONOSCOPY  2011   FOOT SURGERY Bilateral    HYSTERECTOMY ABDOMINAL WITH SALPINGECTOMY     NASAL SINUS SURGERY     Social History   Occupational History   Occupation: Therapist, sports  Tobacco Use   Smoking status: Never   Smokeless tobacco: Never  Vaping Use   Vaping Use: Never used  Substance and Sexual Activity   Alcohol use: Yes    Comment: social   Drug use: No   Sexual activity: Yes    Birth control/protection: Surgical

## 2021-04-25 MED ORDER — METHOCARBAMOL 500 MG PO TABS: 500.0000 mg | ORAL_TABLET | Freq: Four times a day (QID) | ORAL | 2 refills | Status: DC | PRN

## 2021-04-25 NOTE — Addendum Note (Signed)
Addended by: Azucena Cecil on: 04/25/2021 08:43 AM   Modules accepted: Orders

## 2021-04-27 ENCOUNTER — Other Ambulatory Visit: Payer: Self-pay | Admitting: Orthopaedic Surgery

## 2021-04-27 MED ORDER — HYDROCODONE-ACETAMINOPHEN 5-325 MG PO TABS: 1.0000 | ORAL_TABLET | Freq: Every day | ORAL | 0 refills | Status: DC | PRN

## 2021-05-05 ENCOUNTER — Encounter: Payer: Self-pay | Admitting: Orthopaedic Surgery

## 2021-05-06 ENCOUNTER — Ambulatory Visit (HOSPITAL_COMMUNITY): Payer: 59 | Attending: Urology | Admitting: Physical Therapy

## 2021-05-06 ENCOUNTER — Other Ambulatory Visit: Payer: Self-pay

## 2021-05-06 DIAGNOSIS — N3946 Mixed incontinence: Secondary | ICD-10-CM | POA: Diagnosis not present

## 2021-05-06 DIAGNOSIS — M6281 Muscle weakness (generalized): Secondary | ICD-10-CM | POA: Diagnosis not present

## 2021-05-06 NOTE — Telephone Encounter (Signed)
Yes approve

## 2021-05-06 NOTE — Therapy (Signed)
[facility] [place], [place], [address] Phone: [phone]   Fax:  [phone]  Physical Therapy Treatment  Patient Details  Name: Alisha Reed MRN: [id number] Date of Birth: 19-Jul-1960 Referring Provider (PT): Franchot Gallo  PHYSICAL THERAPY DISCHARGE SUMMARY  Visits from Start of Care: 4  Current functional level related to goals / functional outcomes: Improved continence   Remaining deficits: Continues to have episodes of incontinence   Education / Equipment: HEP   Patient agrees to discharge. Patient goals were partially met. Patient is being discharged due to meeting the stated rehab goals.  Encounter Date: 05/06/2021   PT End of Session - 05/06/21 1305     Visit Number 4    Number of Visits 4    Authorization Type UMR    Progress Note Due on Visit 4    PT Start Time 1140    PT Stop Time 1220    PT Time Calculation (min) 40 min    Activity Tolerance Patient tolerated treatment well    Behavior During Therapy WFL for tasks assessed/performed             Past Medical History:  Diagnosis Date   Arthritis    hands,    Carpal tunnel syndrome on both sides    both hands surgeries 01/2019   GERD (gastroesophageal reflux disease)    Hypercholesterolemia    Hypothyroidism    Osteopenia 11/08/2018   Thyroid disease     Past Surgical History:  Procedure Laterality Date   ABDOMINAL HYSTERECTOMY     BILATERAL CARPAL TUNNEL RELEASE Bilateral 01/18/2019   Procedure: BILATERAL CARPAL TUNNEL RELEASE;  Surgeon: Leandrew Koyanagi, MD;  Location: [facility];  Service: Orthopedics;  Laterality: Bilateral;   cholecystectomy     CHOLECYSTECTOMY     COLONOSCOPY  2011   FOOT SURGERY Bilateral    HYSTERECTOMY ABDOMINAL WITH SALPINGECTOMY     NASAL SINUS SURGERY      There were no vitals filed for this visit.   Subjective Assessment - 05/06/21 1143     Subjective PT had an injection in her knee  on th 8th and had an inflammatory response, the pain has been so bad she has not been back to work.  She has not been completing her exercises except for a few kegals due to the knee pain.  States that she had to chase her dog this morning which would normally cause an incident of incontinence but it did not.                               [facility] Adult PT Treatment/Exercise - 05/06/21 0001       Exercises   Exercises Lumbar      Lumbar Exercises: Standing   Shoulder Extension Both;Strengthening;10 reps    Theraband Level (Shoulder Extension) Level 3 (Green)    Theraband Level (Shoulder Adduction) Level 3 (Green)    Shoulder Adduction Limitations 10    Other Standing Lumbar Exercises Shld flextion Rt then Lt with t band x 10; RT flex to Lt hip ; LT flexion to Rt hip x 10      Lumbar Exercises: Seated   Other Seated Lumbar Exercises Kegal 15/30 x s10                     PT Education - 05/06/21 1305     Education Details  reviewed HEP    Person(s) Educated Patient    Methods Explanation              PT Short Term Goals - 05/06/21 1311       PT SHORT TERM GOAL #1   Title Pt to be I in HEP to imporve core strength to note that she is only using 6 pads a day    Time 2    Period Weeks    Status Achieved               PT Long Term Goals - 05/06/21 1311       PT LONG TERM GOAL #1   Title PT to be I in and advance HEP to note that she is only having to use 4 pads per day.    Time 6    Period Weeks    Status Partially Met                   Plan - 05/06/21 1306     Clinical Impression Statement Pt had a knee injection and has had a reaction which caused her to get off her exercise routine for her pelvic and abdominal mm.  Therapist checked and kegals have decreased back to 15" holds.  Pt requested review of Tband exercises which was completed, howeve, pt had increased knee pain after standing for just five reps of the first  exercise therefore they were modified to be completed sitting.  PT states that she feels confident about the discharge at this timel.    Personal Factors and Comorbidities Time since onset of injury/illness/exacerbation;Age    Examination-Activity Limitations Toileting;Continence;Dressing    Examination-Participation Restrictions Community Activity;Occupation;Yard Work;Shop    Stability/Clinical Decision Making Stable/Uncomplicated    Rehab Potential Good    PT Frequency 1x / week    PT Duration 4 weeks    PT Treatment/Interventions Patient/family education;Therapeutic exercise;Therapeutic activities    PT Next Visit Plan discharge.    PT Home Exercise Plan kegals, ab set; supine heelslides and crunches, sidelying abduction all 4 hip extension.; 11/22; Tband exercises all, prone SLR             Patient will benefit from skilled therapeutic intervention in order to improve the following deficits and impairments:  Decreased strength  Visit Diagnosis: Mixed incontinence  Muscle weakness (generalized)     Problem List Patient Active Problem List   Diagnosis Date Noted   Stress incontinence 11/19/2020   S/p bilateral carpal tunnel release 03/28/2019   Chronic pain of right knee 12/29/2018   Right carpal tunnel syndrome 12/29/2018   Left carpal tunnel syndrome 12/29/2018   Osteopenia 11/08/2018   Pre-diabetes 05/02/2018   Acute pain of right shoulder 03/18/2018   Hyperlipidemia 11/04/2017   Vitamin D deficiency 11/04/2017   S/P cholecystectomy 08/02/2017   Skier's thumb, right, subsequent encounter 03/15/2017   Chronic left shoulder pain 02/08/2017   Nondisplaced fracture of greater tuberosity of left humerus, initial encounter for closed fracture 01/18/2017   Encounter for well adult exam with abnormal findings 09/24/2016   Hypothyroidism 09/24/2016   Rayetta Humphrey, PT CLT [phone]  05/06/2021, 1:12 PM  [facility] [place], [place], [address] Phone: [phone]   Fax:  [phone]  Name: Alisha Reed MRN: [id number] Date of Birth: 13-Mar-1961

## 2021-05-07 ENCOUNTER — Telehealth: Payer: Self-pay | Admitting: Orthopaedic Surgery

## 2021-05-07 NOTE — Telephone Encounter (Signed)
[employer] forms received. To [employer]. 

## 2021-05-12 ENCOUNTER — Other Ambulatory Visit (HOSPITAL_COMMUNITY): Payer: Self-pay

## 2021-05-13 ENCOUNTER — Other Ambulatory Visit (HOSPITAL_COMMUNITY): Payer: Self-pay

## 2021-05-28 ENCOUNTER — Other Ambulatory Visit (HOSPITAL_COMMUNITY): Payer: Self-pay

## 2021-06-03 ENCOUNTER — Other Ambulatory Visit: Payer: Self-pay

## 2021-06-03 ENCOUNTER — Encounter: Payer: Self-pay | Admitting: Urology

## 2021-06-03 ENCOUNTER — Ambulatory Visit: Payer: 59 | Admitting: Urology

## 2021-06-03 VITALS — BP 125/79 | HR 76 | Wt 160.0 lb

## 2021-06-03 DIAGNOSIS — R32 Unspecified urinary incontinence: Secondary | ICD-10-CM

## 2021-06-03 LAB — MICROSCOPIC EXAMINATION: Renal Epithel, UA: NONE SEEN /hpf

## 2021-06-03 LAB — URINALYSIS, ROUTINE W REFLEX MICROSCOPIC
Bilirubin, UA: NEGATIVE
Glucose, UA: NEGATIVE
Ketones, UA: NEGATIVE
Leukocytes,UA: NEGATIVE
Nitrite, UA: NEGATIVE
Protein,UA: NEGATIVE
Specific Gravity, UA: 1.01 (ref 1.005–1.030)
Urobilinogen, Ur: 0.2 mg/dL (ref 0.2–1.0)
pH, UA: 6 (ref 5.0–7.5)

## 2021-06-03 NOTE — Progress Notes (Signed)
History of Present Illness: Alisha Reed is a 61 y.o. year old female here for follow-up of stress urinary incontinence.  First seen in September of last year for this.  She had 2-3 pads a day leakage.  She had a good stream and no problems with incomplete emptying.  Referred for physical therapy.  Went for at least 4 sessions of this and has seen a fair response.  At the present time she is fairly comfortable with her situation.  She does exercises on a regular basis.  Past Medical History:  Diagnosis Date   Arthritis    hands,    Carpal tunnel syndrome on both sides    both hands surgeries 01/2019   GERD (gastroesophageal reflux disease)    Hypercholesterolemia    Hypothyroidism    Osteopenia 11/08/2018   Thyroid disease     Past Surgical History:  Procedure Laterality Date   ABDOMINAL HYSTERECTOMY     BILATERAL CARPAL TUNNEL RELEASE Bilateral 01/18/2019   Procedure: BILATERAL CARPAL TUNNEL RELEASE;  Surgeon: Leandrew Koyanagi, MD;  Location: [facility];  Service: Orthopedics;  Laterality: Bilateral;   cholecystectomy     CHOLECYSTECTOMY     COLONOSCOPY  2011   FOOT SURGERY Bilateral    HYSTERECTOMY ABDOMINAL WITH SALPINGECTOMY     NASAL SINUS SURGERY      Home Medications:  (Not in a hospital admission)   Allergies: No Known Allergies  Family History  Problem Relation Age of Onset   Scleroderma Mother    Rheum arthritis Mother    Heart disease Mother    Heart disease Father    Glaucoma Father    Stomach cancer Maternal Grandmother    Heart attack Maternal Grandfather    Alzheimer's disease Paternal Grandmother    Throat cancer Paternal Grandfather    Colon cancer Neg Hx    Liver cancer Neg Hx    Colon polyps Neg Hx    Esophageal cancer Neg Hx    Rectal cancer Neg Hx     Social History:  reports that she has never smoked. She has never used smokeless tobacco. She reports current alcohol use. She reports that she does not use drugs.  ROS: A complete  review of systems was performed.  All systems are negative except for pertinent findings as noted.  Physical Exam:  Vital signs in last 24 hours: @VSRANGES @ General:  Alert and oriented, No acute distress HEENT: Normocephalic, atraumatic Neck: No JVD or lymphadenopathy Cardiovascular: Regular rate  Lungs: Normal inspiratory/expiratory excursion Neurologic: Grossly intact  I have reviewed prior pt notes  I have reviewed notes from physical therapist  I have reviewed urinalysis results   Impression/Assessment:  Stress urinary incontinence, female, improved with physical therapy  Plan:  I recommended she continue her regular exercises  I will leave it up to her to call us if she wants further management-I think she would be a good candidate for a mid urethral sling if necessary  [place] 06/03/2021, 10:24 AM  Lillette Boxer. Ellana Kawa MD

## 2021-06-03 NOTE — Progress Notes (Signed)
Urological Symptom Review  Patient is experiencing the following symptoms: Leakage of urine   Review of Systems  Gastrointestinal (upper)  : Indigestion/heartburn  Gastrointestinal (lower) : Constipation  Constitutional : Negative for symptoms  Skin: Negative for skin symptoms  Eyes: Negative for eye symptoms  Ear/Nose/Throat : Sinus problems  Hematologic/Lymphatic: Negative for Hematologic/Lymphatic symptoms  Cardiovascular : Negative for cardiovascular symptoms  Respiratory : Negative for respiratory symptoms  Endocrine: Negative for endocrine symptoms  Musculoskeletal: Joint pain  Neurological: Negative for neurological symptoms  Psychologic: Negative for psychiatric symptoms

## 2021-07-03 ENCOUNTER — Other Ambulatory Visit (HOSPITAL_COMMUNITY): Payer: Self-pay

## 2021-07-04 IMAGING — MR MR KNEE*R* W/O CM
4 of 6 series · 22 of 40 positions shown · non-contrast
Comparison: Radiographs from 12/29/2018

CLINICAL DATA: Intermittent right knee pain for 6 months

EXAM:
MRI OF THE RIGHT KNEE WITHOUT CONTRAST
TECHNIQUE: Multiplanar, multisequence MR imaging of the knee was performed. No
intravenous contrast was administered.

[Series 4: T1 · coronal · 4.0mm · 0.29mm/px · 3 of 24 slices shown]
[im 5/24]
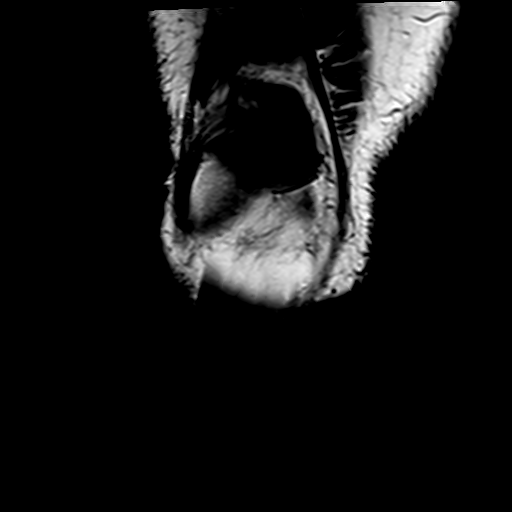
[im 14/24]
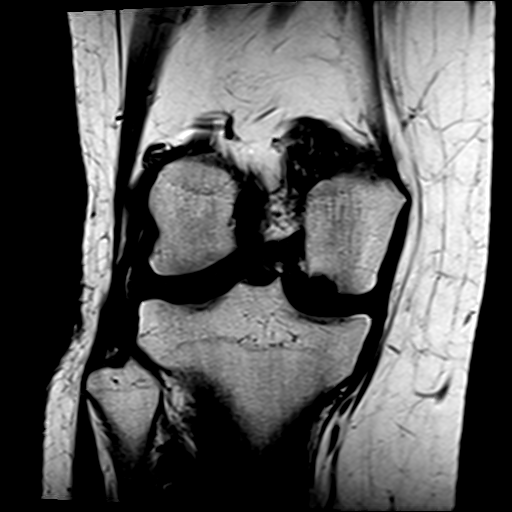
[im 24/24]
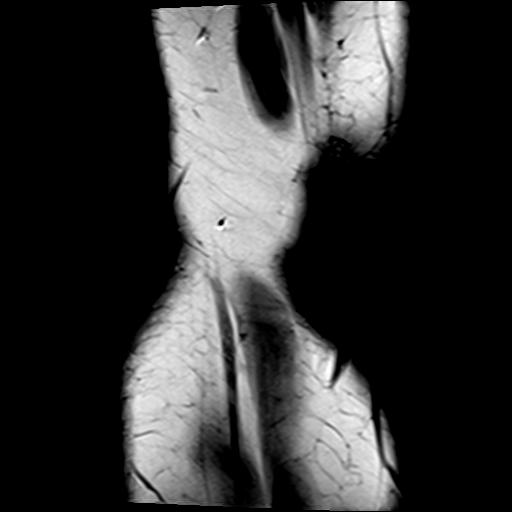

[Series 5: T2 fat-sat · coronal · 4.0mm · 0.59mm/px · 6 of 24 slices shown (1 of 2)]
[im 1/24]
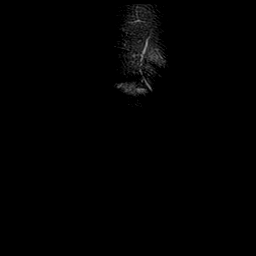
[im 5/24]
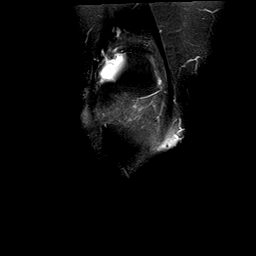
[im 10/24]
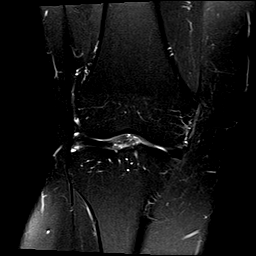
[im 14/24]
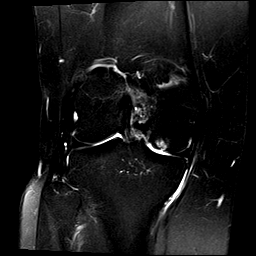
[im 19/24]
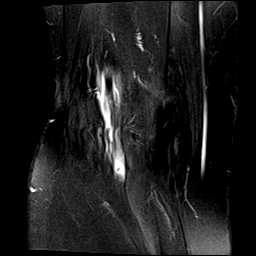
[im 24/24]
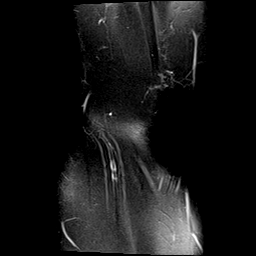

[Series 7: PD fat-sat · sagittal · 3.0mm · 0.29mm/px · 7 of 27 slices shown]
[im 1/27]
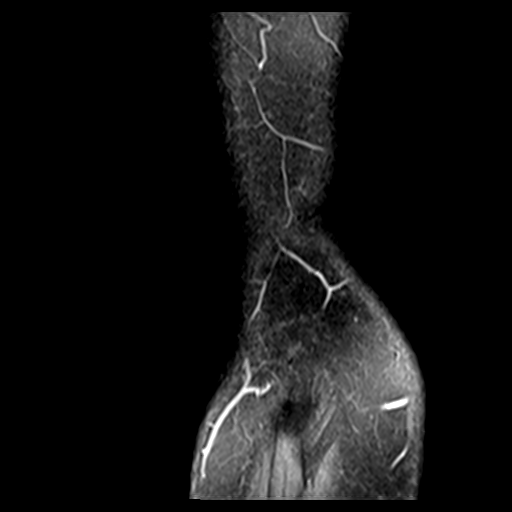
[im 5/27]
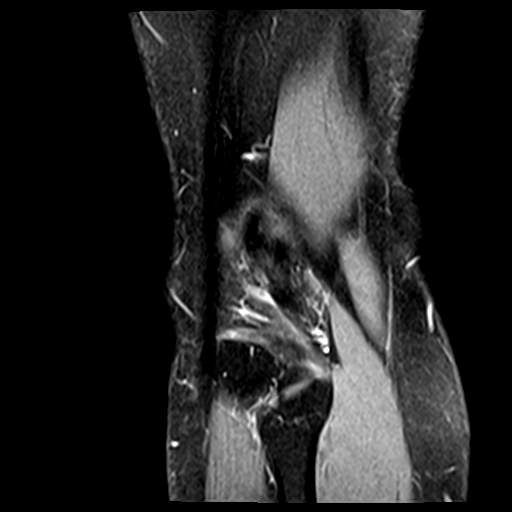
[im 9/27]
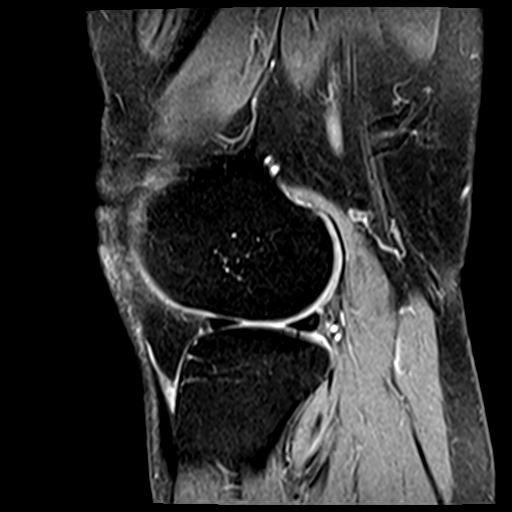
[im 14/27]
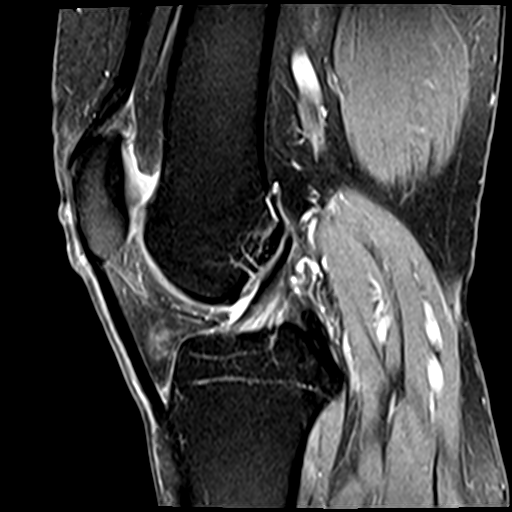
[im 18/27]
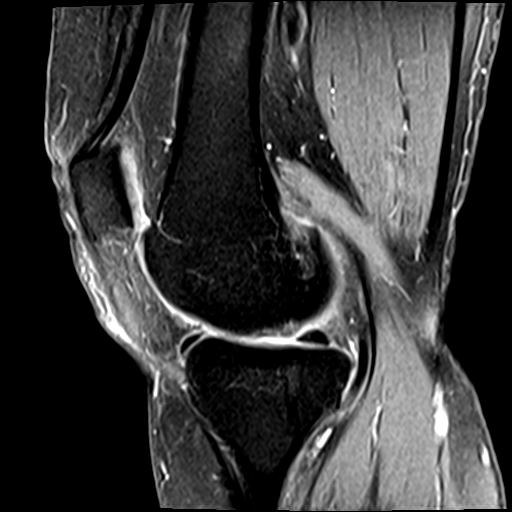
[im 22/27]
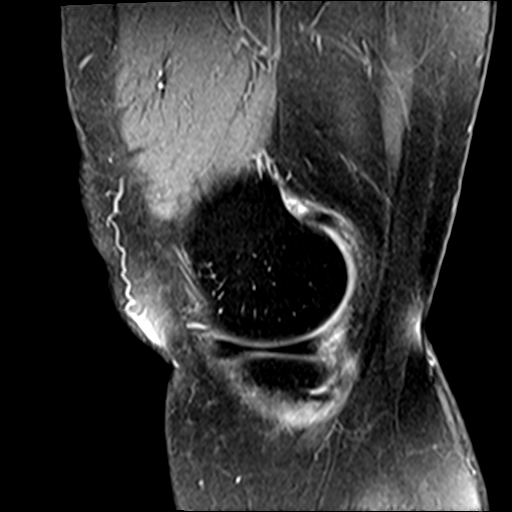
[im 27/27]
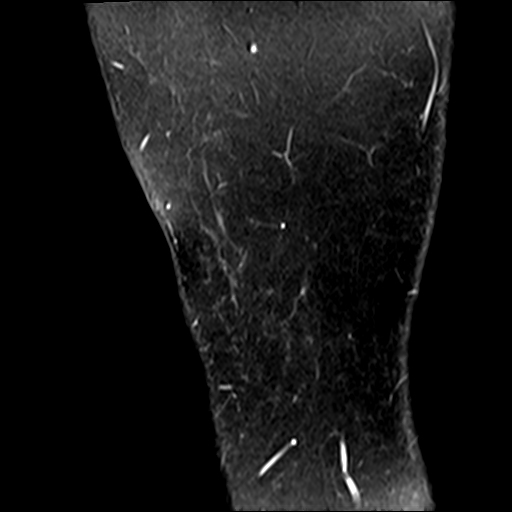

[Series 8: T2 fat-sat · sagittal · 3.0mm · 0.29mm/px · 6 of 27 slices shown (2 of 2)]
[im 1/27]
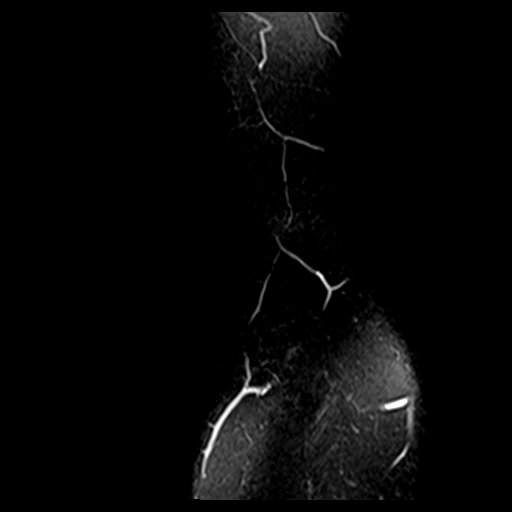
[im 5/27]
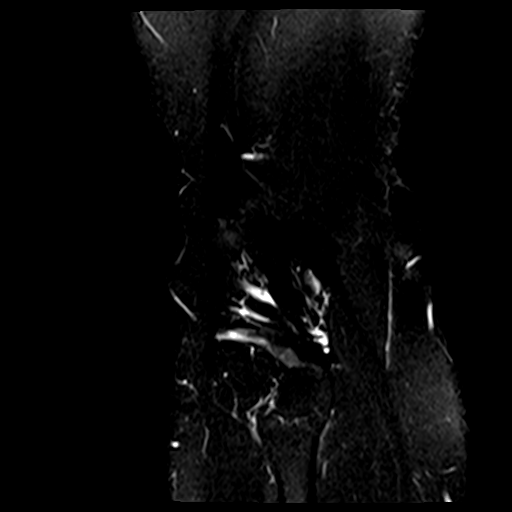
[im 9/27]
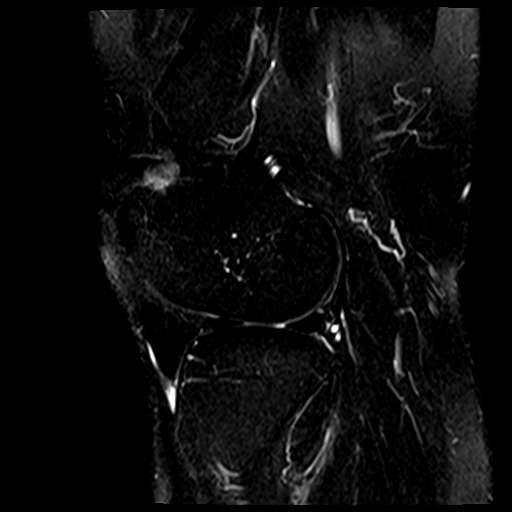
[im 14/27]
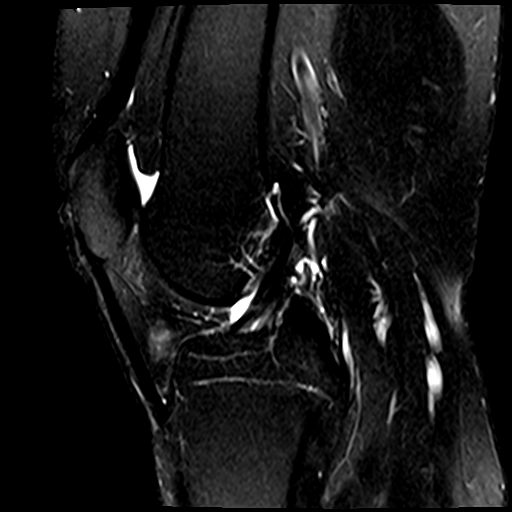
[im 18/27]
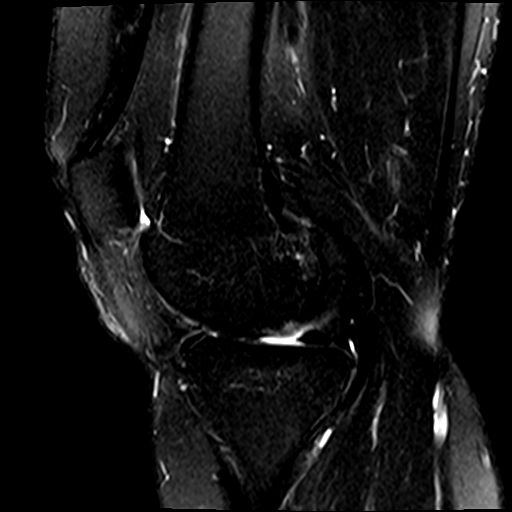
[im 22/27]
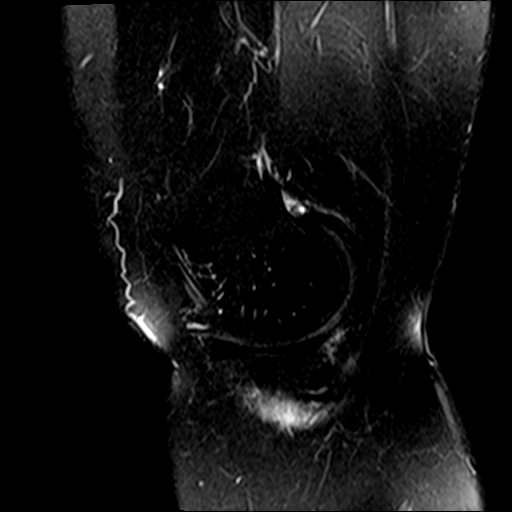

[22 of 40 positions shown; findings below may reference images not displayed]

FINDINGS: MENISCI

Medial meniscus:  Unremarkable

Lateral meniscus: Grade 1 signal in the posterior horn without
surface extension.

LIGAMENTS

Cruciates:  Unremarkable

Collaterals:  Unremarkable

CARTILAGE

Patellofemoral: Moderate chondral thinning in the femoral trochlear
groove.

Medial: Osteochondral lesion posteriorly along the medial femoral
condyle, with mostly partial thickness overlying chondral thinning
(questionable small focus of full-thickness chondral loss at 0.2 cm)
and underlying focus of subcortical marrow edema measuring 0.9 by
0.5 by 0.7 cm (image [DATE]). Otherwise mild to moderate degenerative
chondral thinning.

Lateral:  Mild degenerative chondral thinning.

Joint: In the inferomedial portion of Hoffa's fat pad, a 1.4 by
by 1.1 cm focus of accentuated T2 and reduced T1 signal is present
suggesting focal synovitis. This is a somewhat unusual location for
focal synovitis. A small vascular malformation is a differential
diagnostic consideration although I do not see any internal
phleboliths on the [facility] radiographs.

Popliteal Fossa: Pes anserine bursitis. Suspected partial tear of
the semimembranosus tendon with a elongated cystic lesion resembling
a ganglion cyst extending on images 1 through 20 of series 3.

Extensor Mechanism: Tibial tubercle-trochlear groove distance
cm.

Bones: No significant extra-articular osseous abnormalities
identified.

Other: No supplemental non-categorized findings.
IMPRESSION: 1. Focal synovitis in the inferomedial portion of Hoffa's fat pad.
2. Suspected partial longitudinal tear of the semimembranosus tendon
with associated elongated ganglion cyst.
3. Pes anserine bursitis.
4. Osteochondral lesion posteriorly along the medial femoral condyle
with underlying subcortical marrow edema.
5. Mild to moderate degenerative chondral thinning in the knee
compartments.

## 2021-07-21 ENCOUNTER — Other Ambulatory Visit (HOSPITAL_COMMUNITY): Payer: Self-pay

## 2021-07-22 ENCOUNTER — Encounter: Payer: Self-pay | Admitting: Orthopaedic Surgery

## 2021-07-23 ENCOUNTER — Other Ambulatory Visit: Payer: Self-pay | Admitting: Physician Assistant

## 2021-07-23 ENCOUNTER — Other Ambulatory Visit (HOSPITAL_COMMUNITY): Payer: Self-pay

## 2021-07-23 MED ORDER — TRAMADOL HCL 50 MG PO TABS
50.0000 mg | ORAL_TABLET | Freq: Three times a day (TID) | ORAL | 0 refills | Status: DC | PRN
Start: 2021-07-23 — End: 2021-12-03
  Filled 2021-07-23: qty 60, 20d supply, fill #0

## 2021-07-23 NOTE — Telephone Encounter (Signed)
[facility] to [facility]

## 2021-07-23 NOTE — Telephone Encounter (Signed)
I sent her a message to see where she wants it filled.  Can you try calling her and call to appropriate pharmacy if we have already left for surgery?

## 2021-07-28 ENCOUNTER — Other Ambulatory Visit (HOSPITAL_COMMUNITY): Payer: Self-pay

## 2021-08-11 ENCOUNTER — Other Ambulatory Visit (HOSPITAL_COMMUNITY): Payer: Self-pay

## 2021-08-12 ENCOUNTER — Other Ambulatory Visit (HOSPITAL_COMMUNITY): Payer: Self-pay

## 2021-08-12 ENCOUNTER — Ambulatory Visit: Payer: 59 | Admitting: Orthopaedic Surgery

## 2021-08-12 ENCOUNTER — Encounter: Payer: Self-pay | Admitting: Orthopaedic Surgery

## 2021-08-12 DIAGNOSIS — M25561 Pain in right knee: Secondary | ICD-10-CM

## 2021-08-12 DIAGNOSIS — G8929 Other chronic pain: Secondary | ICD-10-CM

## 2021-08-12 DIAGNOSIS — M1711 Unilateral primary osteoarthritis, right knee: Secondary | ICD-10-CM | POA: Diagnosis not present

## 2021-08-12 MED ORDER — DICLOFENAC SODIUM 75 MG PO TBEC
75.0000 mg | DELAYED_RELEASE_TABLET | Freq: Two times a day (BID) | ORAL | 2 refills | Status: DC
Start: 2021-08-12 — End: 2021-09-23
  Filled 2021-08-12: qty 30, 15d supply, fill #0
  Filled 2021-08-26: qty 30, 15d supply, fill #1
  Filled 2021-09-10: qty 30, 15d supply, fill #2

## 2021-08-12 MED ORDER — BUPIVACAINE HCL 0.5 % IJ SOLN
2.0000 mL | INTRAMUSCULAR | Status: AC | PRN
  Administered 2021-08-12: 2 mL via INTRA_ARTICULAR

## 2021-08-12 MED ORDER — METHYLPREDNISOLONE ACETATE 40 MG/ML IJ SUSP
40.0000 mg | INTRAMUSCULAR | Status: AC | PRN
  Administered 2021-08-12: 40 mg via INTRA_ARTICULAR

## 2021-08-12 MED ORDER — LIDOCAINE HCL 1 % IJ SOLN
2.0000 mL | INTRAMUSCULAR | Status: AC | PRN
  Administered 2021-08-12: 2 mL

## 2021-08-12 NOTE — Progress Notes (Addendum)
? ?Office Visit Note ?  ?Patient: Alisha Reed           ?Date of Birth: [birth year]           ?MRN: [id number] ?Visit Date: 08/12/2021 ?             ?Requested by: Biagio Borg, MD ?[place],  [place] [address] ?PCP: Biagio Borg, MD ? ? ?Assessment & Plan: ?Visit Diagnoses:  ?1. Chronic pain of right knee   ? ? ?Plan: Impression is chronic right knee pain.  I feel that the pain is caused by edema in the medial femoral condyle which may have been exacerbated recently by activity.  She is on her feet for work 8 hours a day at least.  Treatment options were discussed and we agreed to repeat cortisone injection.  We will try an OA reaction brace.  Follow-up as needed. ? ?Follow-Up Instructions: No follow-ups on file.  ? ?Orders:  ?Orders Placed This Encounter  ?Procedures  ?? Large Joint Inj: R knee  ? ?Meds ordered this encounter  ?Medications  ?? bupivacaine (MARCAINE) 0.5 % (with pres) injection 2 mL  ?? lidocaine (XYLOCAINE) 1 % (with pres) injection 2 mL  ?? methylPREDNISolone acetate (DEPO-MEDROL) injection 40 mg  ?? diclofenac (VOLTAREN) 75 MG EC tablet  ?  Sig: Take 1 tablet (75 mg total) by mouth 2 (two) times daily.  ?  Dispense:  30 tablet  ?  Refill:  2  ? ? ? ? Procedures: ?Large Joint Inj: R knee on 08/12/2021 9:10 AM ?Indications: pain ?Details: 22 G needle ? ?Arthrogram: No ? ?Medications: 40 mg methylPREDNISolone acetate 40 MG/ML; 2 mL lidocaine 1 %; 2 mL bupivacaine 0.5 % ?Consent was given by the patient. Patient was prepped and draped in the usual sterile fashion.  ? ? ? ? ?Clinical Data: ?No additional findings. ? ? ?Subjective: ?Chief Complaint  ?Patient presents with  ?? Right Knee - Pain  ? ? ?HPI ? ?Alisha Reed comes in today for ongoing right knee pain.  She continues to have medial sided knee pain without mechanical symptoms or effusion.  Denies any injuries.  She has difficulty staying asleep at night.  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  We did do a cortisone injection and Durolane injection about 3 months ago.  Durolane injection did not help but the cortisone did help some.  Continues to take Celebrex twice a day. ? ?Review of Systems ? ? ?Objective: ?Vital Signs: There were no vitals taken for this visit. ? ?Physical Exam ? ?Ortho Exam ? ?Examination of the right knee shows no joint effusion.  She is quite tender along the medial femoral condyle and medial aspect of the knee.  Slight tenderness along the joint line.  Collaterals and cruciates are stable.  Negative McMurray.  Normal range of motion. ? ?Specialty Comments:  ?No specialty comments available. ? ?Imaging: ?No results found. ? ? ?PMFS History: ?Patient Active Problem List  ? Diagnosis Date Noted  ?? Stress incontinence 11/19/2020  ?? S/p bilateral carpal tunnel release 03/28/2019  ?? Chronic pain of right knee 12/29/2018  ?? Right carpal tunnel syndrome 12/29/2018  ?? Left carpal tunnel syndrome 12/29/2018  ?? Osteopenia 11/08/2018  ?? Pre-diabetes 05/02/2018  ?? Acute pain of right shoulder 03/18/2018  ?? Hyperlipidemia 11/04/2017  ?? Vitamin D deficiency 11/04/2017  ?? S/P cholecystectomy  08/02/2017  ?? Skier's thumb, right, subsequent encounter 03/15/2017  ?? Chronic left shoulder pain 02/08/2017  ?? Nondisplaced fracture of greater tuberosity of left humerus, initial encounter for closed fracture 01/18/2017  ?? Encounter for well adult exam with abnormal findings 09/24/2016  ?? Hypothyroidism 09/24/2016  ? ?Past Medical History:  ?Diagnosis Date  ?? Arthritis   ? hands,   ?? Carpal tunnel syndrome on both sides   ? both hands surgeries 01/2019  ?? GERD (gastroesophageal reflux disease)   ?? Hypercholesterolemia   ?? Hypothyroidism   ?? Osteopenia 11/08/2018  ?? Thyroid disease   ?  ?Family History  ?Problem Relation Age of Onset  ?? Scleroderma Mother   ?? Rheum arthritis Mother   ?? Heart disease Mother   ?? Heart disease  Father   ?? Glaucoma Father   ?? Stomach cancer Maternal Grandmother   ?? Heart attack Maternal Grandfather   ?? Alzheimer's disease Paternal Grandmother   ?? Throat cancer Paternal Grandfather   ?? Colon cancer Neg Hx   ?? Liver cancer Neg Hx   ?? Colon polyps Neg Hx   ?? Esophageal cancer Neg Hx   ?? Rectal cancer Neg Hx   ?  ?Past Surgical History:  ?Procedure Laterality Date  ?? ABDOMINAL HYSTERECTOMY    ?? BILATERAL CARPAL TUNNEL RELEASE Bilateral 01/18/2019  ? Procedure: BILATERAL CARPAL TUNNEL RELEASE;  Surgeon: Leandrew Koyanagi, MD;  Location: [facility];  Service: Orthopedics;  Laterality: Bilateral;  ?? cholecystectomy    ?? CHOLECYSTECTOMY    ?? COLONOSCOPY  2011  ?? FOOT SURGERY Bilateral   ?? HYSTERECTOMY ABDOMINAL WITH SALPINGECTOMY    ?? NASAL SINUS SURGERY    ? ?Social History  ? ?Occupational History  ?? Occupation: Therapist, sports  ?Tobacco Use  ?? Smoking status: Never  ?? Smokeless tobacco: Never  ?Vaping Use  ?? Vaping Use: Never used  ?Substance and Sexual Activity  ?? Alcohol use: Yes  ?  Comment: social  ?? Drug use: No  ?? Sexual activity: Yes  ?  Birth control/protection: Surgical  ? ? ? ? ? ? ?

## 2021-08-12 NOTE — Addendum Note (Signed)
Addended by: Azucena Cecil on: 08/12/2021 09:25 AM ? ? Modules accepted: Orders ? ?

## 2021-08-26 ENCOUNTER — Encounter: Payer: Self-pay | Admitting: Orthopaedic Surgery

## 2021-08-26 ENCOUNTER — Other Ambulatory Visit: Payer: Self-pay | Admitting: Orthopaedic Surgery

## 2021-08-26 ENCOUNTER — Other Ambulatory Visit (HOSPITAL_COMMUNITY): Payer: Self-pay

## 2021-08-26 MED ORDER — ACETAMINOPHEN-CODEINE #3 300-30 MG PO TABS
1.0000 | ORAL_TABLET | Freq: Every day | ORAL | 0 refills | Status: AC | PRN
  Filled 2021-08-26: qty 14, 7d supply, fill #0

## 2021-08-27 ENCOUNTER — Other Ambulatory Visit (HOSPITAL_COMMUNITY): Payer: Self-pay

## 2021-09-02 ENCOUNTER — Other Ambulatory Visit: Payer: Self-pay | Admitting: Orthopaedic Surgery

## 2021-09-02 ENCOUNTER — Other Ambulatory Visit (HOSPITAL_COMMUNITY): Payer: Self-pay

## 2021-09-02 MED ORDER — AMITRIPTYLINE HCL 25 MG PO TABS
25.0000 mg | ORAL_TABLET | Freq: Every day | ORAL | 2 refills | Status: DC
  Filled 2021-09-02: qty 20, 20d supply, fill #0

## 2021-09-02 MED ORDER — TRAZODONE HCL 50 MG PO TABS
50.0000 mg | ORAL_TABLET | Freq: Every day | ORAL | 3 refills | Status: DC
  Filled 2021-09-02: qty 20, 20d supply, fill #0

## 2021-09-10 ENCOUNTER — Other Ambulatory Visit (HOSPITAL_COMMUNITY): Payer: Self-pay

## 2021-09-12 ENCOUNTER — Other Ambulatory Visit (HOSPITAL_COMMUNITY): Payer: Self-pay

## 2021-09-15 ENCOUNTER — Other Ambulatory Visit (HOSPITAL_COMMUNITY): Payer: Self-pay

## 2021-09-15 MED ORDER — ZOSTER VAC RECOMB ADJUVANTED 50 MCG/0.5ML IM SUSR
0.5000 mL | INTRAMUSCULAR | 1 refills | Status: DC
  Filled 2021-09-15: qty 0.5, 1d supply, fill #0
  Filled 2021-11-27 (×2): qty 0.5, 1d supply, fill #1

## 2021-09-16 ENCOUNTER — Other Ambulatory Visit (HOSPITAL_COMMUNITY): Payer: Self-pay

## 2021-09-22 ENCOUNTER — Other Ambulatory Visit (HOSPITAL_COMMUNITY): Payer: Self-pay

## 2021-09-23 ENCOUNTER — Other Ambulatory Visit: Payer: Self-pay | Admitting: Orthopaedic Surgery

## 2021-09-23 ENCOUNTER — Other Ambulatory Visit (HOSPITAL_COMMUNITY): Payer: Self-pay

## 2021-09-24 ENCOUNTER — Other Ambulatory Visit (HOSPITAL_COMMUNITY): Payer: Self-pay

## 2021-09-24 MED ORDER — DICLOFENAC SODIUM 75 MG PO TBEC
75.0000 mg | DELAYED_RELEASE_TABLET | Freq: Two times a day (BID) | ORAL | 2 refills | Status: DC
  Filled 2021-09-24: qty 30, 15d supply, fill #0
  Filled 2021-10-12: qty 30, 15d supply, fill #1
  Filled 2021-10-20: qty 30, 15d supply, fill #2

## 2021-09-26 ENCOUNTER — Other Ambulatory Visit (HOSPITAL_COMMUNITY): Payer: Self-pay

## 2021-10-12 ENCOUNTER — Other Ambulatory Visit: Payer: Self-pay | Admitting: Internal Medicine

## 2021-10-12 NOTE — Telephone Encounter (Signed)
Please refill as per office routine med refill policy (all routine meds to be refilled for 3 mo or monthly (per pt preference) up to one year from last visit, then month to month grace period for 3 mo, then further med refills will have to be denied) ? ?

## 2021-10-13 ENCOUNTER — Other Ambulatory Visit (HOSPITAL_COMMUNITY): Payer: Self-pay

## 2021-10-13 MED ORDER — LEVOTHYROXINE SODIUM 75 MCG PO TABS
75.0000 ug | ORAL_TABLET | Freq: Every day | ORAL | 0 refills | Status: DC
  Filled 2021-10-13 – 2021-10-20 (×2): qty 30, 30d supply, fill #0

## 2021-10-16 DIAGNOSIS — R070 Pain in throat: Secondary | ICD-10-CM | POA: Diagnosis not present

## 2021-10-16 DIAGNOSIS — R0981 Nasal congestion: Secondary | ICD-10-CM | POA: Diagnosis not present

## 2021-10-18 DIAGNOSIS — R059 Cough, unspecified: Secondary | ICD-10-CM | POA: Diagnosis not present

## 2021-10-18 DIAGNOSIS — J020 Streptococcal pharyngitis: Secondary | ICD-10-CM | POA: Diagnosis not present

## 2021-10-18 DIAGNOSIS — J029 Acute pharyngitis, unspecified: Secondary | ICD-10-CM | POA: Diagnosis not present

## 2021-10-20 ENCOUNTER — Other Ambulatory Visit (HOSPITAL_COMMUNITY): Payer: Self-pay

## 2021-10-23 ENCOUNTER — Other Ambulatory Visit (HOSPITAL_COMMUNITY): Payer: Self-pay

## 2021-10-27 ENCOUNTER — Other Ambulatory Visit (HOSPITAL_COMMUNITY): Payer: Self-pay

## 2021-11-02 ENCOUNTER — Other Ambulatory Visit: Payer: Self-pay | Admitting: Physician Assistant

## 2021-11-03 ENCOUNTER — Other Ambulatory Visit (HOSPITAL_COMMUNITY): Payer: Self-pay

## 2021-11-14 ENCOUNTER — Other Ambulatory Visit (HOSPITAL_COMMUNITY): Payer: Self-pay

## 2021-11-14 ENCOUNTER — Encounter: Payer: Self-pay | Admitting: Orthopaedic Surgery

## 2021-11-17 NOTE — Telephone Encounter (Signed)
I am happy to write any nsaid.  why is she unable to get the voltaren?

## 2021-11-18 ENCOUNTER — Other Ambulatory Visit (HOSPITAL_COMMUNITY): Payer: Self-pay

## 2021-11-18 ENCOUNTER — Other Ambulatory Visit: Payer: Self-pay | Admitting: Physician Assistant

## 2021-11-18 ENCOUNTER — Encounter: Payer: 59 | Admitting: Internal Medicine

## 2021-11-18 MED ORDER — DICLOFENAC SODIUM 75 MG PO TBEC
75.0000 mg | DELAYED_RELEASE_TABLET | Freq: Two times a day (BID) | ORAL | 2 refills | Status: DC
  Filled 2021-11-18: qty 30, 15d supply, fill #0
  Filled 2021-11-28 – 2021-12-03 (×2): qty 30, 15d supply, fill #1
  Filled 2021-12-14: qty 30, 15d supply, fill #2

## 2021-11-18 NOTE — Telephone Encounter (Signed)
Sent in

## 2021-11-27 ENCOUNTER — Other Ambulatory Visit (HOSPITAL_COMMUNITY): Payer: Self-pay

## 2021-11-28 ENCOUNTER — Other Ambulatory Visit (HOSPITAL_COMMUNITY): Payer: Self-pay

## 2021-12-02 ENCOUNTER — Other Ambulatory Visit (INDEPENDENT_AMBULATORY_CARE_PROVIDER_SITE_OTHER): Payer: 59

## 2021-12-02 DIAGNOSIS — E538 Deficiency of other specified B group vitamins: Secondary | ICD-10-CM

## 2021-12-02 DIAGNOSIS — E559 Vitamin D deficiency, unspecified: Secondary | ICD-10-CM

## 2021-12-02 DIAGNOSIS — E7800 Pure hypercholesterolemia, unspecified: Secondary | ICD-10-CM | POA: Diagnosis not present

## 2021-12-02 DIAGNOSIS — R7303 Prediabetes: Secondary | ICD-10-CM | POA: Diagnosis not present

## 2021-12-02 LAB — BASIC METABOLIC PANEL
BUN: 16 mg/dL (ref 6–23)
CO2: 29 mEq/L (ref 19–32)
Calcium: 10.6 mg/dL — ABNORMAL HIGH (ref 8.4–10.5)
Chloride: 99 mEq/L (ref 96–112)
Creatinine, Ser: 0.8 mg/dL (ref 0.40–1.20)
GFR: 78.85 mL/min (ref >60.00)
Glucose, Bld: 92 mg/dL (ref 70–99)
Potassium: 4.4 mEq/L (ref 3.5–5.1)
Sodium: 137 mEq/L (ref 135–145)

## 2021-12-02 LAB — LIPID PANEL
Cholesterol: 195 mg/dL (ref 0–200)
HDL: 70.2 mg/dL (ref >39.00)
LDL Cholesterol: 95 mg/dL (ref 0–99)
NonHDL: 124.51
Total CHOL/HDL Ratio: 3
Triglycerides: 148 mg/dL (ref 0.0–149.0)
VLDL: 29.6 mg/dL (ref 0.0–40.0)

## 2021-12-02 LAB — CBC WITH DIFFERENTIAL/PLATELET
Basophils Absolute: 0.1 10*3/uL (ref 0.0–0.1)
Basophils Relative: 0.9 % (ref 0.0–3.0)
Eosinophils Absolute: 0.4 10*3/uL (ref 0.0–0.7)
Eosinophils Relative: 4.4 % (ref 0.0–5.0)
HCT: 39.3 % (ref 36.0–46.0)
Hemoglobin: 13 g/dL (ref 12.0–15.0)
Lymphocytes Relative: 32 % (ref 12.0–46.0)
Lymphs Abs: 2.6 10*3/uL (ref 0.7–4.0)
MCHC: 33.1 g/dL (ref 30.0–36.0)
MCV: 93.1 fl (ref 78.0–100.0)
Monocytes Absolute: 0.6 10*3/uL (ref 0.1–1.0)
Monocytes Relative: 7.4 % (ref 3.0–12.0)
Neutro Abs: 4.5 10*3/uL (ref 1.4–7.7)
Neutrophils Relative %: 55.3 % (ref 43.0–77.0)
Platelets: 387 10*3/uL (ref 150.0–400.0)
RBC: 4.22 Mil/uL (ref 3.87–5.11)
RDW: 14.8 % (ref 11.5–15.5)
WBC: 8.2 10*3/uL (ref 4.0–10.5)

## 2021-12-02 LAB — HEMOGLOBIN A1C: Hgb A1c MFr Bld: 6.2 % (ref 4.6–6.5)

## 2021-12-02 LAB — TSH: TSH: 1.04 u[IU]/mL (ref 0.35–5.50)

## 2021-12-02 LAB — HEPATIC FUNCTION PANEL
ALT: 26 U/L (ref 0–35)
AST: 23 U/L (ref 0–37)
Albumin: 4.2 g/dL (ref 3.5–5.2)
Alkaline Phosphatase: 43 U/L (ref 39–117)
Bilirubin, Direct: 0.1 mg/dL (ref 0.0–0.3)
Total Bilirubin: 0.4 mg/dL (ref 0.2–1.2)
Total Protein: 7.2 g/dL (ref 6.0–8.3)

## 2021-12-02 LAB — VITAMIN B12: Vitamin B-12: 749 pg/mL (ref 211–911)

## 2021-12-02 LAB — VITAMIN D 25 HYDROXY (VIT D DEFICIENCY, FRACTURES): VITD: 56.54 ng/mL (ref 30.00–100.00)

## 2021-12-03 ENCOUNTER — Encounter: Payer: Self-pay | Admitting: Internal Medicine

## 2021-12-03 ENCOUNTER — Other Ambulatory Visit (HOSPITAL_COMMUNITY): Payer: Self-pay

## 2021-12-03 ENCOUNTER — Ambulatory Visit (INDEPENDENT_AMBULATORY_CARE_PROVIDER_SITE_OTHER): Payer: 59 | Admitting: Internal Medicine

## 2021-12-03 VITALS — BP 126/68 | HR 80 | Temp 98.0°F | Ht 62.0 in | Wt 159.0 lb

## 2021-12-03 DIAGNOSIS — E039 Hypothyroidism, unspecified: Secondary | ICD-10-CM

## 2021-12-03 DIAGNOSIS — R7303 Prediabetes: Secondary | ICD-10-CM

## 2021-12-03 DIAGNOSIS — E538 Deficiency of other specified B group vitamins: Secondary | ICD-10-CM

## 2021-12-03 DIAGNOSIS — N393 Stress incontinence (female) (male): Secondary | ICD-10-CM

## 2021-12-03 DIAGNOSIS — Z0001 Encounter for general adult medical examination with abnormal findings: Secondary | ICD-10-CM

## 2021-12-03 DIAGNOSIS — Z0000 Encounter for general adult medical examination without abnormal findings: Secondary | ICD-10-CM | POA: Diagnosis not present

## 2021-12-03 DIAGNOSIS — E559 Vitamin D deficiency, unspecified: Secondary | ICD-10-CM

## 2021-12-03 DIAGNOSIS — E7800 Pure hypercholesterolemia, unspecified: Secondary | ICD-10-CM | POA: Diagnosis not present

## 2021-12-03 DIAGNOSIS — M1990 Unspecified osteoarthritis, unspecified site: Secondary | ICD-10-CM | POA: Insufficient documentation

## 2021-12-03 MED ORDER — ESTRADIOL 1 MG PO TABS
1.0000 mg | ORAL_TABLET | Freq: Every day | ORAL | 3 refills | Status: DC
  Filled 2021-12-03 – 2022-01-18 (×2): qty 90, 90d supply, fill #0

## 2021-12-03 MED ORDER — LEVOTHYROXINE SODIUM 75 MCG PO TABS
75.0000 ug | ORAL_TABLET | Freq: Every day | ORAL | 3 refills | Status: DC
Start: 2021-12-03 — End: 2022-12-08
  Filled 2021-12-03: qty 90, 90d supply, fill #0
  Filled 2022-03-01: qty 90, 90d supply, fill #1
  Filled 2022-05-28: qty 90, 90d supply, fill #2
  Filled 2022-08-24: qty 90, 90d supply, fill #3

## 2021-12-03 MED ORDER — ROSUVASTATIN CALCIUM 40 MG PO TABS
40.0000 mg | ORAL_TABLET | Freq: Every day | ORAL | 3 refills | Status: DC
  Filled 2021-12-03 – 2022-01-18 (×2): qty 90, 90d supply, fill #0
  Filled 2022-04-22: qty 90, 90d supply, fill #1
  Filled 2022-07-19: qty 90, 90d supply, fill #2
  Filled 2022-10-17: qty 90, 90d supply, fill #3

## 2021-12-03 NOTE — Assessment & Plan Note (Signed)
Lab Results  Component Value Date   HGBA1C 6.2 12/02/2021   Stable, pt to continue current medical treatment  - diet, wt control, excercise

## 2021-12-03 NOTE — Assessment & Plan Note (Signed)
Has f/u with gyn-urology in Nov 2023

## 2021-12-03 NOTE — Patient Instructions (Signed)
Please continue all other medications as before, and refills have been done if requested.  Please have the pharmacy call with any other refills you may need.  Please continue your efforts at being more active, low cholesterol diet, and weight control.  You are otherwise up to date with prevention measures today.  Please keep your appointments with your specialists as you may have planned  Please make an Appointment to return for your 1 year visit, or sooner if needed, with Lab testing by Appointment as well, to be done about 3-5 days before at the FIRST FLOOR Lab (so this is for TWO appointments - please see the scheduling desk as you leave)    

## 2021-12-03 NOTE — Progress Notes (Signed)
Patient ID: Alisha Reed, female   DOB: [birth year], 61 y.o.   MRN: [id number]         Chief Complaint:: wellness exam       HPI:  Alisha Reed is a 61 y.o. female here for wellness exam; declines shingrix and covid booster -o/w up to date                        Also Pt denies chest pain, increased sob or doe, wheezing, orthopnea, PND, increased LE swelling, palpitations, dizziness or syncope.  Pt denies polydipsia, polyuria, or new focal neuro s/s.    Pt denies fever, wt loss, night sweats, loss of appetite, or other constitutional symptoms  Will need bilatera rot cuff shoulder surgury at some point, as well as right knee TKR but able to continue to work 2 days per wk for [employer] to keep her health insurance, so putting this off for now.   Wt Readings from Last 3 Encounters:  12/03/21 159 lb (72.1 kg)  06/03/21 160 lb (72.6 kg)  01/28/21 148 lb (67.1 kg)   BP Readings from Last 3 Encounters:  12/03/21 126/68  06/03/21 125/79  01/28/21 (!) 152/72   Immunization History  Administered Date(s) Administered   Influenza,inj,Quad PF,6+ Mos 01/14/2018   Influenza-Unspecified 02/13/2019   PFIZER(Purple Top)SARS-COV-2 Vaccination 05/03/2019, 05/24/2019, 02/28/2020, 08/28/2020, 02/24/2021   PNEUMOCOCCAL CONJUGATE-20 11/15/2020   Tdap 11/04/2017   Zoster Recombinat (Shingrix) 09/25/2021  There are no preventive care reminders to display for this patient.    Past Medical History:  Diagnosis Date   Arthritis    hands,    Carpal tunnel syndrome on both sides    both hands surgeries 01/2019   GERD (gastroesophageal reflux disease)    Hypercholesterolemia    Hypothyroidism    Osteopenia 11/08/2018   Thyroid disease    Past Surgical History:  Procedure Laterality Date   ABDOMINAL HYSTERECTOMY     BILATERAL CARPAL TUNNEL RELEASE Bilateral 01/18/2019   Procedure: BILATERAL CARPAL TUNNEL RELEASE;  Surgeon: Leandrew Koyanagi, MD;  Location: [facility];  Service: Orthopedics;   Laterality: Bilateral;   cholecystectomy     CHOLECYSTECTOMY     COLONOSCOPY  2011   FOOT SURGERY Bilateral    HYSTERECTOMY ABDOMINAL WITH SALPINGECTOMY     NASAL SINUS SURGERY      reports that she has never smoked. She has never used smokeless tobacco. She reports current alcohol use. She reports that she does not use drugs. family history includes Alzheimer's disease in her paternal grandmother; Glaucoma in her father; Heart attack in her maternal grandfather; Heart disease in her father and mother; Rheum arthritis in her mother; Scleroderma in her mother; Stomach cancer in her maternal grandmother; Throat cancer in her paternal grandfather. No Known Allergies Current Outpatient Medications on File Prior to Visit  Medication Sig Dispense Refill   Biotin 1000 MCG CHEW Chew by mouth.     calcium carbonate (OS-CAL - DOSED IN MG OF ELEMENTAL CALCIUM) 1250 (500 Ca) MG tablet Take 1 tablet by mouth.     Cholecalciferol (VITAMIN D) 50 MCG (2000 UT) CAPS Take by mouth.     diclofenac (VOLTAREN) 75 MG EC tablet Take 1 tablet (75 mg total) by mouth 2 (two) times daily. 30 tablet 2   fluticasone (FLONASE) 50 MCG/ACT nasal spray Place 2 sprays into both nostrils daily. 9.9 g 0   vitamin B-12 (CYANOCOBALAMIN) 100 MCG tablet Take 100 mcg by mouth  daily.     VITAMIN D PO Take by mouth.     Zoster Vaccine Adjuvanted [facility]) injection Inject 0.5 mLs into the muscle. 0.5 mL 1   No current facility-administered medications on file prior to visit.        ROS:  All others reviewed and negative.  Objective        PE:  BP 126/68 (BP Location: Right Arm, Patient Position: Sitting, Cuff Size: Normal)   Pulse 80   Temp 98 F (36.7 C) (Oral)   Ht '5\' 2"'$  (1.575 m)   Wt 159 lb (72.1 kg)   SpO2 98%   BMI 29.08 kg/m                 Constitutional: Pt appears in NAD               HENT: Head: NCAT.                Right Ear: External ear normal.                 Left Ear: External ear normal.                 Eyes: . Pupils are equal, round, and reactive to light. Conjunctivae and EOM are normal               Nose: without d/c or deformity               Neck: Neck supple. Gross normal ROM               Cardiovascular: Normal rate and regular rhythm.                 Pulmonary/Chest: Effort normal and breath sounds without rales or wheezing.                Abd:  Soft, NT, ND, + BS, no organomegaly               Neurological: Pt is alert. At baseline orientation, motor grossly intact               Skin: Skin is warm. No rashes, no other new lesions, LE edema - none               Psychiatric: Pt behavior is normal without agitation   Micro: none  Cardiac tracings I have personally interpreted today:  none  Pertinent Radiological findings (summarize): none   Lab Results  Component Value Date   WBC 8.2 12/02/2021   HGB 13.0 12/02/2021   HCT 39.3 12/02/2021   PLT 387.0 12/02/2021   GLUCOSE 92 12/02/2021   CHOL 195 12/02/2021   TRIG 148.0 12/02/2021   HDL 70.20 12/02/2021   LDLCALC 95 12/02/2021   ALT 26 12/02/2021   AST 23 12/02/2021   NA 137 12/02/2021   K 4.4 12/02/2021   CL 99 12/02/2021   CREATININE 0.80 12/02/2021   BUN 16 12/02/2021   CO2 29 12/02/2021   TSH 1.04 12/02/2021   HGBA1C 6.2 12/02/2021   Assessment/Plan:  Alisha Reed is a 61 y.o. White or Caucasian [1] female with  has a past medical history of Arthritis, Carpal tunnel syndrome on both sides, GERD (gastroesophageal reflux disease), Hypercholesterolemia, Hypothyroidism, Osteopenia (11/08/2018), and Thyroid disease.  Vitamin D deficiency Last vitamin D Lab Results  Component Value Date   VD25OH 56.54 12/02/2021   Stable, cont oral replacement   Stress incontinence Has f/u with gyn-urology  in Nov 2023  Pre-diabetes Lab Results  Component Value Date   HGBA1C 6.2 12/02/2021   Stable, pt to continue current medical treatment  - diet, wt control, excercise   Hypothyroidism Lab Results  Component Value  Date   TSH 1.04 12/02/2021   Stable, pt to continue levothyroxine 75 mcg qd   Hyperlipidemia Lab Results  Component Value Date   LDLCALC 95 12/02/2021   Stable, pt to continue current statin crestor 40 mg qd  Followup: Return in about 1 year (around 12/04/2022).  Cathlean Cower, MD 12/03/2021 2:26 PM [facility]

## 2021-12-03 NOTE — Assessment & Plan Note (Signed)
Lab Results  Component Value Date   LDLCALC 95 12/02/2021   Stable, pt to continue current statin crestor 40 mg qd

## 2021-12-03 NOTE — Assessment & Plan Note (Signed)
Lab Results  Component Value Date   TSH 1.04 12/02/2021   Stable, pt to continue levothyroxine 75 mcg qd

## 2021-12-03 NOTE — Assessment & Plan Note (Signed)
Last vitamin D Lab Results  Component Value Date   VD25OH 56.54 12/02/2021   Stable, cont oral replacement

## 2021-12-15 ENCOUNTER — Other Ambulatory Visit (HOSPITAL_COMMUNITY): Payer: Self-pay

## 2021-12-30 ENCOUNTER — Other Ambulatory Visit: Payer: Self-pay | Admitting: Physician Assistant

## 2022-01-06 ENCOUNTER — Encounter: Payer: Self-pay | Admitting: Orthopaedic Surgery

## 2022-01-07 ENCOUNTER — Other Ambulatory Visit (HOSPITAL_COMMUNITY): Payer: Self-pay

## 2022-01-07 ENCOUNTER — Other Ambulatory Visit: Payer: Self-pay | Admitting: Physician Assistant

## 2022-01-07 MED ORDER — DICLOFENAC SODIUM 75 MG PO TBEC
75.0000 mg | DELAYED_RELEASE_TABLET | Freq: Two times a day (BID) | ORAL | 2 refills | Status: DC
  Filled 2022-01-07: qty 30, 15d supply, fill #0
  Filled 2022-01-28: qty 30, 15d supply, fill #1
  Filled 2022-02-10: qty 30, 15d supply, fill #2

## 2022-01-07 NOTE — Telephone Encounter (Signed)
Just sent to [facility]

## 2022-01-08 ENCOUNTER — Other Ambulatory Visit (HOSPITAL_COMMUNITY): Payer: Self-pay

## 2022-01-16 ENCOUNTER — Other Ambulatory Visit (HOSPITAL_COMMUNITY): Payer: Self-pay

## 2022-01-19 ENCOUNTER — Other Ambulatory Visit (HOSPITAL_COMMUNITY): Payer: Self-pay

## 2022-01-28 ENCOUNTER — Other Ambulatory Visit (HOSPITAL_COMMUNITY): Payer: Self-pay

## 2022-02-02 ENCOUNTER — Telehealth: Payer: Self-pay | Admitting: Gastroenterology

## 2022-02-02 ENCOUNTER — Other Ambulatory Visit: Payer: Self-pay

## 2022-02-02 ENCOUNTER — Encounter: Payer: Self-pay | Admitting: Gastroenterology

## 2022-02-02 NOTE — Telephone Encounter (Signed)
DOD  [name] last seen in 2019. GERD, IBS  Patient reports she had extreme abdominal cramping yesterday with difficult passage of stool. Stool was hard. The cramps caused vomiting one time. She reports after passage of hard stool several times, she then began having normal stools and finally diarrhea stools. She started seeing blood when wiping the "7th, 8th, 9th and 10th time" she passed stool. Overnight, she had blood on an incontinence pad she was wearing. (Patient reports urinary incontinence) She could not eat yesterday because food worsened the cramps. She is better today. Tired, but no further blood with wiping. She reports she takes fiber supplements daily. Her water intake has lessened because of the urinary incontinence.

## 2022-02-02 NOTE — Telephone Encounter (Signed)
Inbound call from patient states she is having diarrhea and rectal bleeding, made an appointment with [facility] 10/26, she is requesting a call back.

## 2022-02-02 NOTE — Telephone Encounter (Signed)
Spoke with the patient. She agrees with this plan of care. She is resting with a heating pad to her abdomen. Fluids and rest for today. Not to return to work today or tomorrow if her symptoms persist. Agrees to keep [place] updated on her progress.

## 2022-02-03 NOTE — Telephone Encounter (Signed)
Spoke with the patient. She is eating a little today. No further diarrhea. No appetite, but tolerating what she does consume. Focused on hydration. She will call us if she acutely worsens or fails to continue to improve.

## 2022-02-10 ENCOUNTER — Other Ambulatory Visit (HOSPITAL_COMMUNITY): Payer: Self-pay

## 2022-02-13 ENCOUNTER — Other Ambulatory Visit (HOSPITAL_COMMUNITY): Payer: Self-pay

## 2022-03-01 ENCOUNTER — Other Ambulatory Visit: Payer: Self-pay | Admitting: Physician Assistant

## 2022-03-02 ENCOUNTER — Other Ambulatory Visit (HOSPITAL_COMMUNITY): Payer: Self-pay

## 2022-03-05 ENCOUNTER — Ambulatory Visit: Payer: 59 | Admitting: Gastroenterology

## 2022-03-05 ENCOUNTER — Encounter: Payer: Self-pay | Admitting: Gastroenterology

## 2022-03-05 VITALS — BP 130/66 | HR 78 | Ht 62.5 in | Wt 161.4 lb

## 2022-03-05 DIAGNOSIS — K5909 Other constipation: Secondary | ICD-10-CM

## 2022-03-05 DIAGNOSIS — K625 Hemorrhage of anus and rectum: Secondary | ICD-10-CM | POA: Diagnosis not present

## 2022-03-05 NOTE — Progress Notes (Signed)
03/05/2022 Alisha Reed [id number] [birth year]   HISTORY OF PRESENT ILLNESS: This is a 61 year old female who is a patient of Dr. Doyne Keel.  She has not been seen here since her colonoscopy in May 2021 as below.  She has past medical history of hypothyroidism, GERD, arthritis, and urinary incontinence issues.  She presents here today stating that 6 weeks ago she had an episode with sudden onset severe abdominal cramping with bloody diarrhea.  She says that she was passing clots every hour for several hours.  This then tapered off and resolved and has not recurred since that time.  Says that everything has been at her baseline and feels fine now.  She says that at baseline she tends to be constipated, but then will have episodes of diarrhea.  Does not take anything for her constipation regularly.  Colonoscopy 09/2019:  - The examined portion of the ileum was normal. - One 4 mm polyp in the sigmoid colon, removed with a cold snare. Resected and retrieved. - One 3 mm polyp in the rectum, removed with a cold snare. Resected and retrieved. - Diverticulosis in the sigmoid colon. - The examination was otherwise normal.   Past Medical History:  Diagnosis Date   Arthritis    hands,    Carpal tunnel syndrome on both sides    both hands surgeries 01/2019   GERD (gastroesophageal reflux disease)    Hypercholesterolemia    Hypothyroidism    Osteopenia 11/08/2018   Thyroid disease    Past Surgical History:  Procedure Laterality Date   ABDOMINAL HYSTERECTOMY     BILATERAL CARPAL TUNNEL RELEASE Bilateral 01/18/2019   Procedure: BILATERAL CARPAL TUNNEL RELEASE;  Surgeon: Leandrew Koyanagi, MD;  Location: [facility];  Service: Orthopedics;  Laterality: Bilateral;   cholecystectomy     CHOLECYSTECTOMY     COLONOSCOPY  2011   FOOT SURGERY Bilateral    HYSTERECTOMY ABDOMINAL WITH SALPINGECTOMY     NASAL SINUS SURGERY      reports that she has never smoked. She has never used  smokeless tobacco. She reports current alcohol use. She reports that she does not use drugs. family history includes Alzheimer's disease in her paternal grandmother; Glaucoma in her father; Heart attack in her maternal grandfather; Heart disease in her father and mother; Rheum arthritis in her mother; Scleroderma in her mother; Stomach cancer in her maternal grandmother; Throat cancer in her paternal grandfather. No Known Allergies    Outpatient Encounter Medications as of 03/05/2022  Medication Sig   Biotin 1000 MCG CHEW Chew by mouth.   calcium carbonate (OS-CAL - DOSED IN MG OF ELEMENTAL CALCIUM) 1250 (500 Ca) MG tablet Take 1 tablet by mouth.   Cholecalciferol (VITAMIN D) 50 MCG (2000 UT) CAPS Take by mouth.   diclofenac (VOLTAREN) 75 MG EC tablet Take 1 tablet (75 mg total) by mouth 2 (two) times daily.   estradiol (ESTRACE) 1 MG tablet Take 1 tablet by mouth daily   fluticasone (FLONASE) 50 MCG/ACT nasal spray Place 2 sprays into both nostrils daily.   levothyroxine (SYNTHROID) 75 MCG tablet Take 1 tablet (75 mcg total) by mouth daily before breakfast.   rosuvastatin (CRESTOR) 40 MG tablet Take 1 tablet (40 mg total) by mouth daily.   vitamin B-12 (CYANOCOBALAMIN) 100 MCG tablet Take 100 mcg by mouth daily.   VITAMIN D PO Take by mouth.   Zoster Vaccine Adjuvanted [facility]) injection Inject 0.5 mLs into the muscle.   No facility-administered encounter medications  on file as of 03/05/2022.     REVIEW OF SYSTEMS  : All other systems reviewed and negative except where noted in the History of Present Illness.   PHYSICAL EXAM: BP 130/66   Pulse 78   Ht 5' 2.5" (1.588 m)   Wt 161 lb 6 oz (73.2 kg)   BMI 29.05 kg/m  General: Well developed white female in no acute distress Head: Normocephalic and atraumatic Eyes:  Sclerae anicteric, conjunctiva pink. Ears: Normal auditory acuity Lungs: Clear throughout to auscultation; no W/R/R. Heart: Regular rate and rhythm; no  M/R/G. Abdomen: Soft, non-distended.  BS present.  Non-tender. Musculoskeletal: Symmetrical with no gross deformities  Skin: No lesions on visible extremities Extremities: No edema  Neurological: Alert oriented x 4, grossly non-focal Psychological:  Alert and cooperative. Normal mood and affect  ASSESSMENT AND PLAN: *Rectal bleeding: Had an episode 6 weeks ago where she had severe abdominal cramping with bloody diarrhea containing clots every hour for several hours.  This resolved and has not recurred since then.  Colonoscopy May 2021 with diverticular disease and just a couple of small polyps.  Suspect possibly diverticular bleed versus an episode of possibly ischemic colitis.  Observe for now. *Chronic constipation with episodes of diarrhea: This diarrhea is likely either overflow diarrhea or possibly episodes of self catharsis.  I have asked her to do a MiraLAX bowel purge and then begin taking MiraLAX daily.  **She will send [place] an update on her symptoms in the next few weeks.   CC:  Biagio Borg, MD

## 2022-03-05 NOTE — Patient Instructions (Signed)
Alonza Bogus, PA recommends that you complete a bowel purge (to clean out your bowels). Please do the following: Purchase a bottle of Miralax over the counter as well as a box of 5 mg dulcolax tablets. Take 4 dulcolax tablets. Wait 1 hour. You will then drink 6-8 capfuls of Miralax mixed in an adequate amount of water/juice/gatorade (you may choose which of these liquids to drink) over the next 2-3 hours. You should expect results within 1 to 6 hours after completing the bowel purge.  After you have finished the bowel purge, start Miralax daily and titrate dose depending on bowel movements.   The [facility] providers would like to encourage you to use [facility] to communicate with providers for non-urgent requests or questions.  Due to long hold times on the telephone, sending your provider a message by [facility] may be a faster and more efficient way to get a response.  Please allow 48 business hours for a response.  Please remember that this is for non-urgent requests.

## 2022-03-06 DIAGNOSIS — D2371 Other benign neoplasm of skin of right lower limb, including hip: Secondary | ICD-10-CM | POA: Diagnosis not present

## 2022-03-06 DIAGNOSIS — L821 Other seborrheic keratosis: Secondary | ICD-10-CM | POA: Diagnosis not present

## 2022-03-06 DIAGNOSIS — D225 Melanocytic nevi of trunk: Secondary | ICD-10-CM | POA: Diagnosis not present

## 2022-03-06 DIAGNOSIS — L578 Other skin changes due to chronic exposure to nonionizing radiation: Secondary | ICD-10-CM | POA: Diagnosis not present

## 2022-03-06 DIAGNOSIS — D2372 Other benign neoplasm of skin of left lower limb, including hip: Secondary | ICD-10-CM | POA: Diagnosis not present

## 2022-03-06 DIAGNOSIS — L570 Actinic keratosis: Secondary | ICD-10-CM | POA: Diagnosis not present

## 2022-03-06 DIAGNOSIS — Z85828 Personal history of other malignant neoplasm of skin: Secondary | ICD-10-CM | POA: Diagnosis not present

## 2022-03-10 ENCOUNTER — Other Ambulatory Visit: Payer: Self-pay | Admitting: Physician Assistant

## 2022-03-10 ENCOUNTER — Other Ambulatory Visit (HOSPITAL_COMMUNITY): Payer: Self-pay

## 2022-03-10 ENCOUNTER — Encounter: Payer: Self-pay | Admitting: Orthopaedic Surgery

## 2022-03-10 MED ORDER — DICLOFENAC SODIUM 75 MG PO TBEC
75.0000 mg | DELAYED_RELEASE_TABLET | Freq: Two times a day (BID) | ORAL | 2 refills | Status: DC
  Filled 2022-03-10: qty 60, 30d supply, fill #0
  Filled 2022-04-06: qty 60, 30d supply, fill #1
  Filled 2022-05-06: qty 60, 30d supply, fill #2

## 2022-03-10 NOTE — Telephone Encounter (Signed)
Sent in

## 2022-03-13 ENCOUNTER — Encounter: Payer: Self-pay | Admitting: Gastroenterology

## 2022-03-13 DIAGNOSIS — K5909 Other constipation: Secondary | ICD-10-CM | POA: Insufficient documentation

## 2022-03-13 DIAGNOSIS — K625 Hemorrhage of anus and rectum: Secondary | ICD-10-CM | POA: Insufficient documentation

## 2022-03-13 NOTE — Progress Notes (Signed)
Agree with assessment and plan as outlined.  

## 2022-03-17 ENCOUNTER — Encounter: Payer: Self-pay | Admitting: Obstetrics and Gynecology

## 2022-03-17 ENCOUNTER — Ambulatory Visit (INDEPENDENT_AMBULATORY_CARE_PROVIDER_SITE_OTHER): Payer: 59 | Admitting: Obstetrics and Gynecology

## 2022-03-17 ENCOUNTER — Other Ambulatory Visit (HOSPITAL_COMMUNITY): Payer: Self-pay

## 2022-03-17 VITALS — BP 135/70 | HR 77 | Ht 63.75 in | Wt 160.0 lb

## 2022-03-17 DIAGNOSIS — N3281 Overactive bladder: Secondary | ICD-10-CM

## 2022-03-17 DIAGNOSIS — R350 Frequency of micturition: Secondary | ICD-10-CM | POA: Diagnosis not present

## 2022-03-17 DIAGNOSIS — N393 Stress incontinence (female) (male): Secondary | ICD-10-CM | POA: Diagnosis not present

## 2022-03-17 LAB — POCT URINALYSIS DIPSTICK
Bilirubin, UA: NEGATIVE
Blood, UA: NEGATIVE
Glucose, UA: NEGATIVE
Ketones, UA: NEGATIVE
Nitrite, UA: NEGATIVE
Protein, UA: NEGATIVE
Spec Grav, UA: 1.01 (ref 1.010–1.025)
Urobilinogen, UA: 0.2 E.U./dL
pH, UA: 6 (ref 5.0–8.0)

## 2022-03-17 MED ORDER — TROSPIUM CHLORIDE ER 60 MG PO CP24
1.0000 | ORAL_CAPSULE | Freq: Every day | ORAL | 5 refills | Status: DC
  Filled 2022-03-17: qty 30, 30d supply, fill #0
  Filled 2022-04-12: qty 30, 30d supply, fill #1
  Filled 2022-05-21: qty 30, 30d supply, fill #2
  Filled 2022-06-18: qty 30, 30d supply, fill #3

## 2022-03-17 NOTE — Patient Instructions (Signed)

## 2022-03-17 NOTE — Addendum Note (Signed)
Addended by: Elita Quick on: 03/17/2022 12:25 PM   Modules accepted: Orders

## 2022-03-17 NOTE — Progress Notes (Signed)
[facility] New Patient Evaluation and Consultation  Referring Provider: Biagio Borg, MD PCP: Biagio Borg, MD Date of Service: 03/17/2022  SUBJECTIVE Chief Complaint: New Patient (Initial Visit) Alisha Reed is a 61 y.o. female here for a consult on incontinence.)  History of Present Illness: Alisha Reed is a 61 y.o. White or Caucasian female presenting evaluation of incontinence.     Urinary Symptoms: Leaks urine with cough/ sneeze, laughing, exercise, lifting, going from sitting to standing, during sex, with a full bladder, with urgency, and while asleep SUI = UUI symptoms.  Leaks 6-7 time(s) per day.  Pad use: 5 pads per day.   She is bothered by her UI symptoms. Has done 4 months of pelvic PT and did not see an improvement.  Lost 20 lbs initially but did not see improvement with the leakage.  Has not tried a medication.   Day time voids 4-5.  Nocturia: 0-1 times per night to void. Voiding dysfunction: she empties her bladder well.  does not use a catheter to empty bladder.  When urinating, she feels a weak stream and the need to urinate multiple times in a row Drinks: 2 cups coffee in AM, 3 glasses unsweet tea, very little water per day  UTIs:  0  UTI's in the last year.   Reports history of blood in urine- had seen [facility] a few years ago for this issue. She states she did not need a cystoscopy.   Pelvic Organ Prolapse Symptoms:                  She Denies a feeling of a bulge the vaginal area.   Bowel Symptom: Bowel movements: 2 time(s) per week Stool consistency: hard Straining: yes.  Splinting: no.  Incomplete evacuation: yes.  She Denies accidental bowel leakage / fecal incontinence Bowel regimen: miralax twice a day- just went to GI and started this regimen  Sexual Function Sexually active: yes.  Sexual orientation: Straight Pain with sex: No  Pelvic Pain Denies pelvic pain    Past Medical History:  Past Medical History:   Diagnosis Date   Arthritis    hands,    Carpal tunnel syndrome on both sides    both hands surgeries 01/2019   GERD (gastroesophageal reflux disease)    Hypercholesterolemia    Hypothyroidism    Osteopenia 11/08/2018   Thyroid disease      Past Surgical History:   Past Surgical History:  Procedure Laterality Date   BILATERAL CARPAL TUNNEL RELEASE Bilateral 01/18/2019   Procedure: BILATERAL CARPAL TUNNEL RELEASE;  Surgeon: Leandrew Koyanagi, MD;  Location: [facility];  Service: Orthopedics;  Laterality: Bilateral;   CHOLECYSTECTOMY     COLONOSCOPY  2011   FOOT SURGERY Bilateral    HYSTERECTOMY ABDOMINAL WITH SALPINGECTOMY     NASAL SINUS SURGERY       Past OB/GYN History: OB History  Gravida Para Term Preterm AB Living  '2 1 1     2  '$ SAB IAB Ectopic Multiple Live Births          2    # Outcome Date GA Lbr Len/2nd Weight Sex Delivery Anes PTL Lv  2 Gravida      Vag-Spont     1 Term      Vag-Spont      S/p hysterectomy   Medications: She has a current medication list which includes the following prescription(s): biotin, calcium carbonate, vitamin d, diclofenac, estradiol, fluticasone, levothyroxine, rosuvastatin,  trospium chloride, vitamin b-12, vitamin d, and zoster vaccine adjuvanted.   Allergies: Patient has No Known Allergies.   Social History:  Social History   Tobacco Use   Smoking status: Never   Smokeless tobacco: Never  Vaping Use   Vaping Use: Never used  Substance Use Topics   Alcohol use: Yes    Comment: social   Drug use: No    Relationship status: married She lives with husband.   She is employed as an Haematologist at [employer]. Regular exercise: No History of abuse: No  Family History:   Family History  Problem Relation Age of Onset   Scleroderma Mother    Rheum arthritis Mother    Heart disease Mother    Heart disease Father    Glaucoma Father    Stomach cancer Maternal Grandmother    Heart attack Maternal Grandfather     Alzheimer's disease Paternal Grandmother    Throat cancer Paternal Grandfather    Colon cancer Neg Hx    Liver cancer Neg Hx    Colon polyps Neg Hx    Esophageal cancer Neg Hx    Rectal cancer Neg Hx      Review of Systems: Review of Systems  Constitutional:  Positive for malaise/fatigue. Negative for fever and weight loss.       + weight gain  Respiratory:  Negative for cough, shortness of breath and wheezing.   Cardiovascular:  Negative for chest pain, palpitations and leg swelling.  Gastrointestinal:  Negative for abdominal pain and blood in stool.  Genitourinary:  Negative for dysuria.  Musculoskeletal:  Negative for myalgias.  Skin:  Negative for rash.  Neurological:  Negative for dizziness and headaches.  Endo/Heme/Allergies:  Does not bruise/bleed easily.  Psychiatric/Behavioral:  Negative for depression. The patient is not nervous/anxious.      OBJECTIVE Physical Exam: Vitals:   03/17/22 0832  BP: 135/70  Pulse: 77  Weight: 160 lb (72.6 kg)  Height: 5' 3.75" (1.619 m)    Physical Exam Constitutional:      General: She is not in acute distress. Pulmonary:     Effort: Pulmonary effort is normal.  Abdominal:     General: There is no distension.     Palpations: Abdomen is soft.     Tenderness: There is no abdominal tenderness. There is no rebound.  Musculoskeletal:        General: No swelling. Normal range of motion.  Skin:    General: Skin is warm and dry.     Findings: No rash.  Neurological:     Mental Status: She is alert and oriented to person, place, and time.  Psychiatric:        Mood and Affect: Mood normal.        Behavior: Behavior normal.      GU / Detailed Urogynecologic Evaluation:  Pelvic Exam: Normal external female genitalia; Bartholin's and Skene's glands normal in appearance; urethral meatus normal in appearance, no urethral masses or discharge.   CST: negative  s/p hysterectomy: Speculum exam reveals normal vaginal mucosa with   atrophy and normal vaginal cuff.  Adnexa no mass, fullness, tenderness.    Pelvic floor strength III/V  Pelvic floor musculature: Right levator non-tender, Right obturator non-tender, Left levator non-tender, Left obturator non-tender  POP-Q:   POP-Q  -2.5  Aa   -2.5                                           Ba  -7.5                                              C   3.5                                            Gh  3.5                                            Pb  8                                            tvl   -2.5                                            Ap  -2.5                                            Bp                                                 D      Rectal Exam:  Normal external rectum  Post-Void Residual (PVR): In order to evaluate bladder emptying, we discussed obtaining a postvoid residual and she agreed to this procedure.  Straight catheter was placed and PVR of 38m was obtained.   Laboratory Results: POC urine: negative   ASSESSMENT AND PLAN Alisha Reed a 61y.o. with:  1. Overactive bladder   2. SUI (stress urinary incontinence, female)    OAB - We discussed the symptoms of overactive bladder (OAB), which include urinary urgency, urinary frequency, nocturia, with or without urge incontinence.  While we do not know the exact etiology of OAB, several treatment options exist. We discussed management including behavioral therapy (decreasing bladder irritants, urge suppression strategies, timed voids, bladder retraining), physical therapy, medication; for refractory cases posterior tibial nerve stimulation, sacral neuromodulation, and intravesical botulinum toxin injection.  - Prescribed Trospium '60mg'$  ER daily. For anticholinergic medications, we discussed the potential side effects of anticholinergics including dry eyes, dry mouth, constipation, cognitive impairment and urinary retention. - She  will work on reducing coffee and tea intake and drinking more water.   2. SUI - For treatment of stress urinary incontinence,  non-surgical options include expectant management, weight loss, physical therapy, as well as a pessary.  Surgical options include a midurethral  sling, Burch urethropexy, and transurethral injection of a bulking agent. - Handouts provided on her options. She is interested in a procedure. She will return for simple CMG to demonstrate leakage and will finalize plan at that time.   Return for simple CMG   Jaquita Folds, MD

## 2022-03-18 ENCOUNTER — Other Ambulatory Visit (HOSPITAL_COMMUNITY): Payer: Self-pay

## 2022-03-19 ENCOUNTER — Other Ambulatory Visit (HOSPITAL_COMMUNITY): Payer: Self-pay

## 2022-04-06 ENCOUNTER — Other Ambulatory Visit (HOSPITAL_COMMUNITY): Payer: Self-pay

## 2022-04-14 ENCOUNTER — Other Ambulatory Visit (HOSPITAL_COMMUNITY): Payer: Self-pay

## 2022-04-15 ENCOUNTER — Ambulatory Visit (INDEPENDENT_AMBULATORY_CARE_PROVIDER_SITE_OTHER): Payer: 59 | Admitting: Obstetrics and Gynecology

## 2022-04-15 ENCOUNTER — Other Ambulatory Visit (HOSPITAL_COMMUNITY): Payer: Self-pay

## 2022-04-15 ENCOUNTER — Encounter: Payer: Self-pay | Admitting: Obstetrics and Gynecology

## 2022-04-15 VITALS — BP 133/83 | HR 75

## 2022-04-15 DIAGNOSIS — R350 Frequency of micturition: Secondary | ICD-10-CM

## 2022-04-15 DIAGNOSIS — N3281 Overactive bladder: Secondary | ICD-10-CM | POA: Diagnosis not present

## 2022-04-15 DIAGNOSIS — N393 Stress incontinence (female) (male): Secondary | ICD-10-CM

## 2022-04-15 MED ORDER — SULFAMETHOXAZOLE-TRIMETHOPRIM 800-160 MG PO TABS
1.0000 | ORAL_TABLET | Freq: Two times a day (BID) | ORAL | 0 refills | Status: AC
  Filled 2022-04-15: qty 2, 1d supply, fill #0

## 2022-04-15 NOTE — Progress Notes (Signed)
[facility] Return Visit  SUBJECTIVE  History of Present Illness: Alisha Reed is a 61 y.o. female seen in follow-up for mixed incontinence. Plan at last visit was to start trospium '60mg'$  ER daily for OAB.   She has been on the medication about two weeks, not sure if she has seen much improvement yet but she has noticed some dry mouth.   Past Medical History: Patient  has a past medical history of Arthritis, Carpal tunnel syndrome on both sides, GERD (gastroesophageal reflux disease), Hypercholesterolemia, Hypothyroidism, Osteopenia (11/08/2018), and Thyroid disease.   Past Surgical History: She  has a past surgical history that includes Hysterectomy abdominal with salpingectomy; Foot surgery (Bilateral); Nasal sinus surgery; Bilateral carpal tunnel release (Bilateral, 01/18/2019); Colonoscopy (2011); and Cholecystectomy.   Medications: She has a current medication list which includes the following prescription(s): biotin, calcium carbonate, vitamin d, diclofenac, estradiol, fluticasone, levothyroxine, rosuvastatin, sulfamethoxazole-trimethoprim, trospium chloride, vitamin b-12, vitamin d, and zoster vaccine adjuvanted.   Allergies: Patient has No Known Allergies.   Social History: Patient  reports that she has never smoked. She has never used smokeless tobacco. She reports current alcohol use. She reports that she does not use drugs.      OBJECTIVE     Physical Exam: Vitals:   04/15/22 0907  BP: 133/83  Pulse: 75   Gen: No apparent distress, A&O x 3.  Detailed Urogynecologic Evaluation:  Deferred.   Verbal consent was obtained to perform simple CMG procedure:   Urethra was prepped with betadine and a 32F catheter was placed and bladder was drained completely. The bladder was then backfilled with sterile water by gravity.  First sensation: 160m First Desire: 1617mStrong Desire: 25039mapacity: 350m58mugh stress test was positive. Valsalva stress test was  not done.  Catheter was replaced to drain bladder.   Interpretation: CMG showed normal sensation, and normal cystometric capacity. Findings positive for stress incontinence, negative for detrusor overactivity.       ASSESSMENT AND PLAN    Ms. SmitPrewa 60 y18. with:  1. SUI (stress urinary incontinence, female)   2. Urinary frequency   3. Overactive bladder    SUI - demonstrated leakage today.  - Discussed options of sling vs urethral bulking and she would like to try urethral bulking in office. Reviewed procedure with her today. Can expect about a 70-80% improvement and may need a second injection.  - Will send bactrim DS to take day of procedure. She does not feel like she needs valium.   2. OAB - Continue with trospium for now. If no improvement at next visit will consider other options.   Return for urethral bulking  MichJaquita Folds

## 2022-04-17 ENCOUNTER — Encounter: Payer: Self-pay | Admitting: Orthopaedic Surgery

## 2022-04-17 ENCOUNTER — Ambulatory Visit: Payer: 59 | Admitting: Orthopaedic Surgery

## 2022-04-17 DIAGNOSIS — M1711 Unilateral primary osteoarthritis, right knee: Secondary | ICD-10-CM | POA: Diagnosis not present

## 2022-04-17 MED ORDER — BUPIVACAINE HCL 0.5 % IJ SOLN
2.0000 mL | INTRAMUSCULAR | Status: AC | PRN
  Administered 2022-04-17: 2 mL via INTRA_ARTICULAR

## 2022-04-17 MED ORDER — METHYLPREDNISOLONE ACETATE 40 MG/ML IJ SUSP
40.0000 mg | INTRAMUSCULAR | Status: AC | PRN
  Administered 2022-04-17: 40 mg via INTRA_ARTICULAR

## 2022-04-17 MED ORDER — LIDOCAINE HCL 1 % IJ SOLN
2.0000 mL | INTRAMUSCULAR | Status: AC | PRN
  Administered 2022-04-17: 2 mL

## 2022-04-17 NOTE — Progress Notes (Signed)
Office Visit Note   Patient: Alisha Reed           Date of Birth: 03/16/1961           MRN: [id number] Visit Date: 04/17/2022              Requested by: Biagio Borg, MD [address],  [place] [id number] PCP: Biagio Borg, MD   Assessment & Plan: Visit Diagnoses:  1. Primary osteoarthritis of right knee     Plan: Impression is right knee osteoarthritis.  Cortisone reinjected today.  Patient tolerated well.  Will see her back as needed.  Follow-Up Instructions: No follow-ups on file.   Orders:  No orders of the defined types were placed in this encounter.  No orders of the defined types were placed in this encounter.     Procedures: Large Joint Inj: R knee on 04/17/2022 2:31 PM Indications: pain Details: 22 G needle  Arthrogram: No  Medications: 40 mg methylPREDNISolone acetate 40 MG/ML; 2 mL lidocaine 1 %; 2 mL bupivacaine 0.5 % Consent was given by the patient. Patient was prepped and draped in the usual sterile fashion.       Clinical Data: No additional findings.   Subjective: Chief Complaint  Patient presents with   Right Knee - Pain    HPI Alisha Reed returns today for recurrent right knee pain.  She has osteoarthritis.  Last cortisone injection was 8 months ago when it wore off about a month ago.  Requesting another cortisone injection. Review of Systems   Objective: Vital Signs: There were no vitals taken for this visit.  Physical Exam  Ortho Exam Stable right knee exam.  No joint effusion. Specialty Comments:  No specialty comments available.  Imaging: No results found.   PMFS History: Patient Active Problem List   Diagnosis Date Noted   Rectal bleeding 03/13/2022   Other constipation 03/13/2022   Arthritis 12/03/2021   Stress incontinence 11/19/2020   S/p bilateral carpal tunnel release 03/28/2019   Chronic pain of right knee 12/29/2018   Right carpal tunnel syndrome 12/29/2018   Left carpal tunnel syndrome 12/29/2018    Osteopenia 11/08/2018   Pre-diabetes 05/02/2018   Acute pain of right shoulder 03/18/2018   Hyperlipidemia 11/04/2017   Vitamin D deficiency 11/04/2017   S/P cholecystectomy 08/02/2017   Skier's thumb, right, subsequent encounter 03/15/2017   Chronic left shoulder pain 02/08/2017   Nondisplaced fracture of greater tuberosity of left humerus, initial encounter for closed fracture 01/18/2017   Encounter for well adult exam with abnormal findings 09/24/2016   Hypothyroidism 09/24/2016   Past Medical History:  Diagnosis Date   Arthritis    hands,    Carpal tunnel syndrome on both sides    both hands surgeries 01/2019   GERD (gastroesophageal reflux disease)    Hypercholesterolemia    Hypothyroidism    Osteopenia 11/08/2018   Thyroid disease     Family History  Problem Relation Age of Onset   Scleroderma Mother    Rheum arthritis Mother    Heart disease Mother    Heart disease Father    Glaucoma Father    Stomach cancer Maternal Grandmother    Heart attack Maternal Grandfather    Alzheimer's disease Paternal Grandmother    Throat cancer Paternal Grandfather    Colon cancer Neg Hx    Liver cancer Neg Hx    Colon polyps Neg Hx    Esophageal cancer Neg Hx    Rectal cancer Neg  Hx     Past Surgical History:  Procedure Laterality Date   BILATERAL CARPAL TUNNEL RELEASE Bilateral 01/18/2019   Procedure: BILATERAL CARPAL TUNNEL RELEASE;  Surgeon: Leandrew Koyanagi, MD;  Location: [facility];  Service: Orthopedics;  Laterality: Bilateral;   CHOLECYSTECTOMY     COLONOSCOPY  2011   FOOT SURGERY Bilateral    HYSTERECTOMY ABDOMINAL WITH SALPINGECTOMY     NASAL SINUS SURGERY     Social History   Occupational History   Occupation: Therapist, sports  Tobacco Use   Smoking status: Never   Smokeless tobacco: Never  Vaping Use   Vaping Use: Never used  Substance and Sexual Activity   Alcohol use: Yes    Comment: social   Drug use: No   Sexual activity: Yes    Birth  control/protection: Surgical

## 2022-04-20 ENCOUNTER — Other Ambulatory Visit (HOSPITAL_COMMUNITY): Payer: Self-pay

## 2022-04-20 DIAGNOSIS — Z1231 Encounter for screening mammogram for malignant neoplasm of breast: Secondary | ICD-10-CM | POA: Diagnosis not present

## 2022-04-20 DIAGNOSIS — N951 Menopausal and female climacteric states: Secondary | ICD-10-CM | POA: Diagnosis not present

## 2022-04-20 DIAGNOSIS — Z01419 Encounter for gynecological examination (general) (routine) without abnormal findings: Secondary | ICD-10-CM | POA: Diagnosis not present

## 2022-04-20 DIAGNOSIS — Z9229 Personal history of other drug therapy: Secondary | ICD-10-CM | POA: Diagnosis not present

## 2022-04-20 DIAGNOSIS — Z719 Counseling, unspecified: Secondary | ICD-10-CM | POA: Diagnosis not present

## 2022-04-20 MED ORDER — ESTRADIOL 1 MG PO TABS
1.0000 mg | ORAL_TABLET | Freq: Every day | ORAL | 4 refills | Status: DC
  Filled 2022-04-20: qty 90, 90d supply, fill #0
  Filled 2022-07-19: qty 90, 90d supply, fill #1
  Filled 2022-10-17: qty 90, 90d supply, fill #2
  Filled 2023-01-16: qty 90, 90d supply, fill #3

## 2022-04-21 ENCOUNTER — Other Ambulatory Visit: Payer: Self-pay | Admitting: Physician Assistant

## 2022-04-21 ENCOUNTER — Encounter: Payer: Self-pay | Admitting: Orthopaedic Surgery

## 2022-04-21 ENCOUNTER — Other Ambulatory Visit: Payer: Self-pay

## 2022-04-21 ENCOUNTER — Other Ambulatory Visit (HOSPITAL_COMMUNITY): Payer: Self-pay

## 2022-04-21 DIAGNOSIS — M8580 Other specified disorders of bone density and structure, unspecified site: Secondary | ICD-10-CM

## 2022-04-21 MED ORDER — TRAMADOL HCL 50 MG PO TABS
50.0000 mg | ORAL_TABLET | Freq: Two times a day (BID) | ORAL | 2 refills | Status: DC | PRN
  Filled 2022-04-21: qty 60, 30d supply, fill #0
  Filled 2022-07-19: qty 60, 30d supply, fill #1
  Filled 2022-10-06: qty 60, 30d supply, fill #2

## 2022-04-21 NOTE — Telephone Encounter (Signed)
Sent to pharmacy on file 

## 2022-04-22 ENCOUNTER — Telehealth: Payer: 59 | Admitting: Physician Assistant

## 2022-04-22 DIAGNOSIS — Z20818 Contact with and (suspected) exposure to other bacterial communicable diseases: Secondary | ICD-10-CM | POA: Diagnosis not present

## 2022-04-22 DIAGNOSIS — J029 Acute pharyngitis, unspecified: Secondary | ICD-10-CM | POA: Diagnosis not present

## 2022-04-22 MED ORDER — AMOXICILLIN 500 MG PO TABS: 500.0000 mg | ORAL_TABLET | Freq: Two times a day (BID) | ORAL | 0 refills | Status: AC

## 2022-04-22 NOTE — Progress Notes (Signed)

## 2022-04-22 NOTE — Progress Notes (Signed)
I have spent 5 minutes in review of e-visit questionnaire, review and updating patient chart, medical decision making and response to patient.   Ligia Duguay Cody Gelisa Tieken, PA-C    

## 2022-04-28 ENCOUNTER — Telehealth: Payer: 59 | Admitting: Physician Assistant

## 2022-04-28 DIAGNOSIS — H1033 Unspecified acute conjunctivitis, bilateral: Secondary | ICD-10-CM

## 2022-04-28 MED ORDER — POLYMYXIN B-TRIMETHOPRIM 10000-0.1 UNIT/ML-% OP SOLN: OPHTHALMIC | 0 refills | Status: DC

## 2022-04-28 NOTE — Progress Notes (Signed)
I have spent 5 minutes in review of e-visit questionnaire, review and updating patient chart, medical decision making and response to patient.   Jarelyn Bambach Cody Zelda Reames, PA-C    

## 2022-04-28 NOTE — Progress Notes (Signed)

## 2022-05-21 ENCOUNTER — Encounter: Payer: Self-pay | Admitting: Obstetrics and Gynecology

## 2022-05-28 ENCOUNTER — Ambulatory Visit: Payer: Commercial Managed Care - PPO | Admitting: Obstetrics and Gynecology

## 2022-05-28 ENCOUNTER — Encounter: Payer: Self-pay | Admitting: Obstetrics and Gynecology

## 2022-05-28 VITALS — BP 120/82 | HR 88

## 2022-05-28 DIAGNOSIS — N393 Stress incontinence (female) (male): Secondary | ICD-10-CM | POA: Diagnosis not present

## 2022-05-28 DIAGNOSIS — R350 Frequency of micturition: Secondary | ICD-10-CM | POA: Diagnosis not present

## 2022-05-28 LAB — POCT URINALYSIS DIPSTICK
Bilirubin, UA: NEGATIVE
Glucose, UA: NEGATIVE
Ketones, UA: NEGATIVE
Leukocytes, UA: NEGATIVE
Nitrite, UA: NEGATIVE
Protein, UA: NEGATIVE
Spec Grav, UA: 1.01 (ref 1.010–1.025)
Urobilinogen, UA: 0.2 E.U./dL
pH, UA: 7 (ref 5.0–8.0)

## 2022-05-28 MED ORDER — LIDOCAINE-EPINEPHRINE 1 %-1:100000 IJ SOLN
8.0000 mL | Freq: Once | INTRAMUSCULAR | Status: AC
  Administered 2022-05-28: 8 mL

## 2022-05-28 MED ORDER — LIDOCAINE HCL URETHRAL/MUCOSAL 2 % EX GEL
1.0000 | Freq: Once | CUTANEOUS | Status: AC
  Administered 2022-05-28: 1 via URETHRAL

## 2022-05-28 NOTE — Addendum Note (Signed)
Addended by: Elita Quick on: 05/28/2022 11:32 AM   Modules accepted: Orders

## 2022-05-28 NOTE — Addendum Note (Signed)
Addended by: Jaquita Folds on: 05/28/2022 09:59 AM   Modules accepted: Orders

## 2022-05-28 NOTE — Patient Instructions (Signed)
Taking Care of Yourself after Urodynamics, Cystoscopy, Bulkamid Injection, or Botox Injection   Drink plenty of water for a day or two following your procedure. Try to have about 8 ounces (one cup) at a time, and do this 6 times or more per day unless you have fluid restrictitons AVOID irritative beverages such as coffee, tea, soda, alcoholic or citrus drinks for a day or two, as this may cause burning with urination.  For the first 1-2 days after the procedure, your urine may be pink or red in color. You may have some blood in your urine as a normal side effect of the procedure. Large amounts of bleeding or difficulty urinating are NOT normal. Call the nurse line if this happens or go to the nearest Emergency Room if the bleeding is heavy or you cannot urinate at all and it is after hours. If you had a Bulkamid injection in the urethra and need to be catheterized, ask for a pediatric catheter to be used (size 10 or 12-French) so the material is not pushed out of place.   You may experience some discomfort or a burning sensation with urination after having this procedure. You can use over the counter Azo or pyridium to help with burning and follow the instructions on the packaging. If it does not improve within 1-2 days, or other symptoms appear (fever, chills, or difficulty urinating) call the office to speak to a nurse.  You may return to normal daily activities such as work, school, driving, exercising and housework on the day of the procedure. If your doctor gave you a prescription, take it as ordered.     

## 2022-05-28 NOTE — Progress Notes (Signed)
Bulkamid Injection  CC: 62 y.o. y.o. F with stress incontinence who presents for transurethral Bulkamid injection.  Patient signed her consent form.  She started antibiotic prophylaxis today.  Procedure: Time out was performed. The bladder was catheterized and 10 ml of 2% lidocaine jelly placed in the urethra. A urethral block was performed by injecting 60m of 1% lidocaine with epinephrine at 3 and 6 o'clock adjacent to the urethra.  The needle was primed.  The cystoscope was inserted to the level of the bladder neck.  The needle was inserted 2 cm and the scope was pulled back into the urethra 2 cm.  The needle was inserted bevel up at the 5 o'clock position and the Bulkamid was injected to obtain coaptation.  This was repeated at the 2 o'clock,  10 o'clock and 7 o'clock positions.   A total of 2- 163msyringes were used and good circumferential coaptation was noted.  The patient tolerated the procedure well. She was asked to void after the procedure.  Post-Void Residual (PVR) by Bladder Scan: In order to evaluate bladder emptying, we discussed obtaining a postvoid residual and she agreed to this procedure.  Procedure: The ultrasound unit was placed on the patient's abdomen in the suprapubic region after the patient had voided. A PVR of 26 ml was obtained by bladder scan.   ASSESSMENT: [age].o. y.o. s/p transurethral Bulkamid injection for stress incontinence.   PLAN: Patient will follow up in 4 weeks to reassess. Voiding and post-procedure precautions were given. She will return for heavy bleeding, fevers, dysuria lasting beyond today and incomplete emptying.  All questions were answered.  MiJaquita FoldsMD

## 2022-06-04 ENCOUNTER — Ambulatory Visit: Payer: Self-pay | Admitting: Obstetrics and Gynecology

## 2022-06-11 ENCOUNTER — Other Ambulatory Visit: Payer: Self-pay | Admitting: Physician Assistant

## 2022-06-11 ENCOUNTER — Ambulatory Visit: Payer: Self-pay | Admitting: Obstetrics and Gynecology

## 2022-06-11 ENCOUNTER — Encounter: Payer: Self-pay | Admitting: Orthopaedic Surgery

## 2022-06-12 ENCOUNTER — Other Ambulatory Visit (HOSPITAL_COMMUNITY): Payer: Self-pay

## 2022-06-12 ENCOUNTER — Other Ambulatory Visit: Payer: Self-pay | Admitting: Physician Assistant

## 2022-06-12 MED ORDER — DICLOFENAC SODIUM 75 MG PO TBEC
75.0000 mg | DELAYED_RELEASE_TABLET | Freq: Two times a day (BID) | ORAL | 2 refills | Status: DC
  Filled 2022-06-12: qty 60, 30d supply, fill #0
  Filled 2022-07-10: qty 60, 30d supply, fill #1
  Filled 2022-08-14 – 2022-08-24 (×2): qty 60, 30d supply, fill #2

## 2022-06-12 NOTE — Telephone Encounter (Signed)
sent

## 2022-06-18 ENCOUNTER — Other Ambulatory Visit: Payer: Self-pay

## 2022-06-19 ENCOUNTER — Other Ambulatory Visit: Payer: Self-pay

## 2022-06-19 ENCOUNTER — Other Ambulatory Visit (HOSPITAL_COMMUNITY): Payer: Self-pay

## 2022-06-26 ENCOUNTER — Ambulatory Visit: Payer: Commercial Managed Care - PPO | Admitting: Obstetrics and Gynecology

## 2022-06-26 ENCOUNTER — Encounter: Payer: Self-pay | Admitting: Obstetrics and Gynecology

## 2022-06-26 ENCOUNTER — Other Ambulatory Visit (HOSPITAL_COMMUNITY): Payer: Self-pay

## 2022-06-26 VITALS — BP 129/84 | HR 82

## 2022-06-26 DIAGNOSIS — N3281 Overactive bladder: Secondary | ICD-10-CM

## 2022-06-26 DIAGNOSIS — N393 Stress incontinence (female) (male): Secondary | ICD-10-CM | POA: Diagnosis not present

## 2022-06-26 MED ORDER — MIRABEGRON ER 25 MG PO TB24
25.0000 mg | ORAL_TABLET | Freq: Every day | ORAL | 5 refills | Status: DC
  Filled 2022-06-26 – 2022-07-10 (×2): qty 30, 30d supply, fill #0

## 2022-06-26 NOTE — Progress Notes (Signed)
 Urogynecology Return Visit  SUBJECTIVE  History of Present Illness: Alisha Reed is a 62 y.o. female seen in follow-up for SUI. She underwent urethral bulking on 05/28/22.   Since she went back to work, she has been about 50% better with the stress incontinence symptoms.   She is still taking the trospium. Does not have urgency really but still having "surprise leaks" several times per day. Still has to wear a moderate pad.  Past Medical History: Patient  has a past medical history of Arthritis, Carpal tunnel syndrome on both sides, GERD (gastroesophageal reflux disease), Hypercholesterolemia, Hypothyroidism, Osteopenia (11/08/2018), and Thyroid disease.   Past Surgical History: She  has a past surgical history that includes Hysterectomy abdominal with salpingectomy; Foot surgery (Bilateral); Nasal sinus surgery; Bilateral carpal tunnel release (Bilateral, 01/18/2019); Colonoscopy (2011); and Cholecystectomy.   Medications: She has a current medication list which includes the following prescription(s): calcium carbonate, vitamin d, diclofenac, estradiol, estradiol, fluticasone, levothyroxine, mirabegron er, rosuvastatin, tramadol, vitamin b-12, vitamin d, and zoster vaccine adjuvanted.   Allergies: Patient has No Known Allergies.   Social History: Patient  reports that she has never smoked. She has never used smokeless tobacco. She reports current alcohol use. She reports that she does not use drugs.      OBJECTIVE     Physical Exam: Vitals:   06/26/22 0912  BP: (!) 148/82  Pulse: 82   Gen: No apparent distress, A&O x 3.  Detailed Urogynecologic Evaluation:  Deferred.   ASSESSMENT AND PLAN    [name] is a 62 y.o. with:  1. Overactive bladder   2. SUI (stress urinary incontinence, female)    OAB - will try a different medication. Samples provided for Myrbetriq 20m.   2. SUI - She would like to see how the medication works because its hard for her to  know what her symptoms are from sometimes.  - She is considering repeat bulking vs sling for SUI symptoms.   Return 4 weeks  MJaquita Folds MD

## 2022-06-29 ENCOUNTER — Encounter: Payer: Self-pay | Admitting: *Deleted

## 2022-06-29 ENCOUNTER — Encounter: Payer: Self-pay | Admitting: Orthopaedic Surgery

## 2022-06-30 ENCOUNTER — Encounter: Payer: Self-pay | Admitting: Obstetrics and Gynecology

## 2022-07-12 ENCOUNTER — Other Ambulatory Visit (HOSPITAL_COMMUNITY): Payer: Self-pay

## 2022-07-13 ENCOUNTER — Other Ambulatory Visit (HOSPITAL_COMMUNITY): Payer: Self-pay

## 2022-07-15 ENCOUNTER — Encounter: Payer: Self-pay | Admitting: Orthopaedic Surgery

## 2022-07-15 ENCOUNTER — Ambulatory Visit (INDEPENDENT_AMBULATORY_CARE_PROVIDER_SITE_OTHER)
Admission: RE | Admit: 2022-07-15 | Discharge: 2022-07-15 | Disposition: A | Discharge status: Home / Self Care | Payer: Commercial Managed Care - PPO | Source: Ambulatory Visit | Attending: Orthopaedic Surgery | Admitting: Orthopaedic Surgery

## 2022-07-15 ENCOUNTER — Encounter: Payer: Self-pay | Admitting: Internal Medicine

## 2022-07-15 ENCOUNTER — Other Ambulatory Visit: Payer: Self-pay | Admitting: Internal Medicine

## 2022-07-15 ENCOUNTER — Other Ambulatory Visit (HOSPITAL_COMMUNITY): Payer: Self-pay

## 2022-07-15 DIAGNOSIS — E2839 Other primary ovarian failure: Secondary | ICD-10-CM

## 2022-07-15 DIAGNOSIS — M8580 Other specified disorders of bone density and structure, unspecified site: Secondary | ICD-10-CM

## 2022-07-15 MED ORDER — ALENDRONATE SODIUM 70 MG PO TABS
70.0000 mg | ORAL_TABLET | ORAL | 3 refills | Status: DC
  Filled 2022-07-15: qty 12, 84d supply, fill #0

## 2022-07-20 ENCOUNTER — Other Ambulatory Visit: Payer: Self-pay

## 2022-07-24 ENCOUNTER — Other Ambulatory Visit (HOSPITAL_COMMUNITY): Payer: Self-pay

## 2022-07-31 ENCOUNTER — Encounter: Payer: Self-pay | Admitting: Obstetrics and Gynecology

## 2022-07-31 ENCOUNTER — Ambulatory Visit (INDEPENDENT_AMBULATORY_CARE_PROVIDER_SITE_OTHER): Payer: Commercial Managed Care - PPO | Admitting: Obstetrics and Gynecology

## 2022-07-31 ENCOUNTER — Other Ambulatory Visit (HOSPITAL_COMMUNITY): Payer: Self-pay

## 2022-07-31 VITALS — BP 136/77 | HR 103 | Ht 62.95 in | Wt 168.6 lb

## 2022-07-31 DIAGNOSIS — N393 Stress incontinence (female) (male): Secondary | ICD-10-CM | POA: Diagnosis not present

## 2022-07-31 MED ORDER — ACETAMINOPHEN 500 MG PO TABS
500.0000 mg | ORAL_TABLET | Freq: Four times a day (QID) | ORAL | 0 refills | Status: DC | PRN
  Filled 2022-07-31: qty 30, 8d supply, fill #0

## 2022-07-31 MED ORDER — POLYETHYLENE GLYCOL 3350 17 GM/SCOOP PO POWD
17.0000 g | Freq: Every day | ORAL | 0 refills | Status: DC
  Filled 2022-07-31: qty 255, 15d supply, fill #0

## 2022-07-31 MED ORDER — OXYCODONE HCL 5 MG PO TABS
5.0000 mg | ORAL_TABLET | ORAL | 0 refills | Status: DC | PRN
  Filled 2022-07-31: qty 5, 1d supply, fill #0

## 2022-07-31 NOTE — Progress Notes (Signed)
Urogynecology Pre-Operative visit  Subjective Chief Complaint: Alisha Reed presents for a preoperative encounter.   History of Present Illness: Alisha Reed is a 62 y.o. female who presents for preoperative visit.  She is scheduled to undergo Exam under anesthesia, midurethral sling, cystoscopy on 08/10/22.  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 She underwent urethral bulking with only about 50% improvement and wanted more definitive treatment.    CMG showed normal sensation, and normal cystometric capacity. Findings positive for stress incontinence, negative for detrusor overactivity.   She has been taking the myrbetriq for about a month without any improvement.   Past Medical History:  Diagnosis Date   Arthritis    hands,    Carpal tunnel syndrome on both sides    both hands surgeries 01/2019   GERD (gastroesophageal reflux disease)    Hypercholesterolemia    Hypothyroidism    Osteopenia 11/08/2018   Thyroid disease      Past Surgical History:  Procedure Laterality Date   BILATERAL CARPAL TUNNEL RELEASE Bilateral 01/18/2019   Procedure: BILATERAL CARPAL TUNNEL RELEASE;  Surgeon: Leandrew Koyanagi, MD;  Location: [facility];  Service: Orthopedics;  Laterality: Bilateral;   CHOLECYSTECTOMY     COLONOSCOPY  2011   FOOT SURGERY Bilateral    HYSTERECTOMY ABDOMINAL WITH SALPINGECTOMY     NASAL SINUS SURGERY      has No Known Allergies.   Family History  Problem Relation Age of Onset   Scleroderma Mother    Rheum arthritis Mother    Heart disease Mother    Heart disease Father    Glaucoma Father    Stomach cancer Maternal Grandmother    Heart attack Maternal Grandfather    Alzheimer's disease Paternal Grandmother    Throat cancer Paternal Grandfather    Colon cancer Neg Hx    Liver cancer Neg Hx    Colon polyps Neg Hx    Esophageal cancer Neg Hx    Rectal cancer Neg Hx     Social  History   Tobacco Use   Smoking status: Never   Smokeless tobacco: Never  Vaping Use   Vaping Use: Never used  Substance Use Topics   Alcohol use: Yes    Comment: social   Drug use: No     Review of Systems was negative for a full 10 system review except as noted in the History of Present Illness.   Current Outpatient Medications:    calcium carbonate (OS-CAL - DOSED IN MG OF ELEMENTAL CALCIUM) 1250 (500 Ca) MG tablet, Take 1 tablet by mouth., Disp: , Rfl:    Cholecalciferol (VITAMIN D) 50 MCG (2000 UT) CAPS, Take by mouth., Disp: , Rfl:    diclofenac (VOLTAREN) 75 MG EC tablet, Take 1 tablet (75 mg total) by mouth 2 (two) times daily., Disp: 60 tablet, Rfl: 2   estradiol (ESTRACE) 1 MG tablet, 1 tab by mouth daily, Disp: 90 tablet, Rfl: 4   fluticasone (FLONASE) 50 MCG/ACT nasal spray, Place 2 sprays into both nostrils daily., Disp: 9.9 g, Rfl: 0   levothyroxine (SYNTHROID) 75 MCG tablet, Take 1 tablet (75 mcg total) by mouth daily before breakfast., Disp: 90 tablet, Rfl: 3   mirabegron ER (MYRBETRIQ) 25 MG TB24 tablet, Take 1 tablet (25 mg total) by mouth daily., Disp: 30 tablet, Rfl: 5   rosuvastatin (CRESTOR) 40 MG tablet, Take 1 tablet (40 mg total) by mouth daily., Disp: 90 tablet, Rfl:  3   traMADol (ULTRAM) 50 MG tablet, Take 1 tablet (50 mg total) by mouth every 12 (twelve) hours as needed., Disp: 60 tablet, Rfl: 2   vitamin B-12 (CYANOCOBALAMIN) 100 MCG tablet, Take 100 mcg by mouth daily., Disp: , Rfl:    VITAMIN D PO, Take by mouth., Disp: , Rfl:    alendronate (FOSAMAX) 70 MG tablet, Take 1 tablet (70 mg total) by mouth every 7 (seven) days. Take with a full glass of water on an empty stomach., Disp: 12 tablet, Rfl: 3   estradiol (ESTRACE) 1 MG tablet, Take 1 tablet by mouth daily, Disp: 90 tablet, Rfl: 3   Zoster Vaccine Adjuvanted (SHINGRIX) injection, Inject 0.5 mLs into the muscle., Disp: 0.5 mL, Rfl: 1   Objective Vitals:   07/31/22 1517  BP: 136/77  Pulse: (!)  103    Gen: NAD CV: S1 S2 RRR Lungs: Clear to auscultation bilaterally   Previous Pelvic Exam showed: POP-Q   -2.5                                            Aa   -2.5                                           Ba   -7.5                                              C    3.5                                            Gh   3.5                                            Pb   8                                            tvl    -2.5                                            Ap   -2.5                                            Bp                                                  D  Assessment/ Plan  Assessment: The patient is a 62 y.o. year old scheduled to undergo Exam under anesthesia, midurethral sling, cystoscopy. Verbal consent was obtained for these procedures.  Plan: General Surgical Consent: The patient has previously been counseled on alternative treatments, and the decision by the patient and provider was to proceed with the procedure listed above.  For all procedures, there are risks of bleeding, infection, damage to surrounding organs including but not limited to bowel, bladder, blood vessels, ureters and nerves, and need for further surgery if an injury were to occur. These risks are all low with minimally invasive surgery.   There are risks of numbness and weakness at any body site or buttock/rectal pain.  It is possible that baseline pain can be worsened by surgery, either with or without mesh. If surgery is vaginal, there is also a low risk of possible conversion to laparoscopy or open abdominal incision where indicated. Very rare risks include blood transfusion, blood clot, heart attack, pneumonia, or death.   There is also a risk of short-term postoperative urinary retention with need to use a catheter. About half of patients need to go home from surgery with a catheter, which is then later removed in the office. The risk of long-term need for a  catheter is very low. There is also a risk of worsening of overactive bladder.   Sling: The effectiveness of a midurethral vaginal mesh sling is approximately 85%, and thus, there will be times when you may leak urine after surgery, especially if your bladder is full or if you have a strong cough. There is a balance between making the sling tight enough to treat your leakage but not too tight so that you have long-term difficulty emptying your bladder. A mesh sling will not directly treat overactive bladder/urge incontinence and may worsen it.  There is an FDA safety notification on vaginal mesh procedures for prolapse but NOT mesh slings. We have extensive experience and training with mesh placement and we have close postoperative follow up to identify any potential complications from mesh. It is important to realize that this mesh is a permanent implant that cannot be easily removed. There are rare risks of mesh exposure (2-4%), pain with intercourse (0-7%), and infection (<1%). The risk of mesh exposure if more likely in a woman with risks for poor healing (prior radiation, poorly controlled diabetes, or immunocompromised). The risk of new or worsened chronic pain after mesh implant is more common in women with baseline chronic pain and/or poorly controlled anxiety or depression. Approximately 2-4% of patients will experience longer-term post-operative voiding dysfunction that may require surgical revision of the sling. We also reviewed that postoperatively, her stream may not be as strong as before surgery.    We discussed consent for blood products. Risks for blood transfusion include allergic reactions, other reactions that can affect different body organs and managed accordingly, transmission of infectious diseases such as HIV or Hepatitis. However, the blood is screened. Patient consents for blood products.  Pre-operative instructions:  She was instructed to not take Aspirin/NSAIDs x 7days prior to  surgery.  Antibiotic prophylaxis was ordered as indicated.  Catheter use: Patient will go home with foley if needed after post-operative voiding trial.  Post-operative instructions:  She was provided with specific post-operative instructions, including precautions and signs/symptoms for which we would recommend contacting us, in addition to daytime and after-hours contact phone numbers. This was provided on a handout.   Post-operative medications: Prescriptions for tylenol, miralax, and oxycodone were  sent to her pharmacy. She takes voltaren as well.   Laboratory testing:  no labs needed  Preoperative clearance:  She does not require surgical clearance.    Post-operative follow-up:  A post-operative appointment will be made for 6 weeks from the date of surgery. If she needs a post-operative nurse visit for a voiding trial, that will be set up after she leaves the hospital.    Patient will call the clinic or use MyChart should anything change or any new issues arise.   Jaquita Folds, MD   Time spent: I spent 20 minutes dedicated to the care of this patient on the date of this encounter to include pre-visit review of records, face-to-face time with the patient and post visit documentation.

## 2022-07-31 NOTE — H&P (Signed)
[facility] Pre-Operative H&P  Subjective Chief Complaint: Alisha Reed presents for a preoperative encounter.   History of Present Illness: Alisha Reed is a 62 y.o. female who presents for preoperative visit.  She is scheduled to undergo Exam under anesthesia, midurethral sling, cystoscopy on 08/10/22.  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 She underwent urethral bulking with only about 50% improvement and wanted more definitive treatment.    CMG showed normal sensation, and normal cystometric capacity. Findings positive for stress incontinence, negative for detrusor overactivity.   She has been taking the myrbetriq for about a month without any improvement.   Past Medical History:  Diagnosis Date   Arthritis    hands,    Carpal tunnel syndrome on both sides    both hands surgeries 01/2019   GERD (gastroesophageal reflux disease)    Hypercholesterolemia    Hypothyroidism    Osteopenia 11/08/2018   Thyroid disease      Past Surgical History:  Procedure Laterality Date   BILATERAL CARPAL TUNNEL RELEASE Bilateral 01/18/2019   Procedure: BILATERAL CARPAL TUNNEL RELEASE;  Surgeon: Alisha Koyanagi, MD;  Location: [facility];  Service: Orthopedics;  Laterality: Bilateral;   CHOLECYSTECTOMY     COLONOSCOPY  2011   FOOT SURGERY Bilateral    HYSTERECTOMY ABDOMINAL WITH SALPINGECTOMY     NASAL SINUS SURGERY      has No Known Allergies.   Family History  Problem Relation Age of Onset   Scleroderma Mother    Rheum arthritis Mother    Heart disease Mother    Heart disease Father    Glaucoma Father    Stomach cancer Maternal Grandmother    Heart attack Maternal Grandfather    Alzheimer's disease Paternal Grandmother    Throat cancer Paternal Grandfather    Colon cancer Neg Hx    Liver cancer Neg Hx    Colon polyps Neg Hx    Esophageal cancer Neg Hx    Rectal cancer Neg Hx     Social History    Tobacco Use   Smoking status: Never   Smokeless tobacco: Never  Vaping Use   Vaping Use: Never used  Substance Use Topics   Alcohol use: Yes    Comment: social   Drug use: No     Review of Systems was negative for a full 10 system review except as noted in the History of Present Illness.  No current facility-administered medications for this encounter.  Current Outpatient Medications:    acetaminophen (TYLENOL) 500 MG tablet, Take 1 tablet (500 mg total) by mouth every 6 (six) hours as needed (pain)., Disp: 30 tablet, Rfl: 0   alendronate (FOSAMAX) 70 MG tablet, Take 1 tablet (70 mg total) by mouth every 7 (seven) days. Take with a full glass of water on an empty stomach., Disp: 12 tablet, Rfl: 3   calcium carbonate (OS-CAL - DOSED IN MG OF ELEMENTAL CALCIUM) 1250 (500 Ca) MG tablet, Take 1 tablet by mouth., Disp: , Rfl:    Cholecalciferol (VITAMIN D) 50 MCG (2000 UT) CAPS, Take by mouth., Disp: , Rfl:    diclofenac (VOLTAREN) 75 MG EC tablet, Take 1 tablet (75 mg total) by mouth 2 (two) times daily., Disp: 60 tablet, Rfl: 2   estradiol (ESTRACE) 1 MG tablet, Take 1 tablet by mouth daily, Disp: 90 tablet, Rfl: 3   estradiol (ESTRACE) 1 MG tablet, 1 tab by mouth daily, Disp: 90 tablet, Rfl:  4   fluticasone (FLONASE) 50 MCG/ACT nasal spray, Place 2 sprays into both nostrils daily., Disp: 9.9 g, Rfl: 0   levothyroxine (SYNTHROID) 75 MCG tablet, Take 1 tablet (75 mcg total) by mouth daily before breakfast., Disp: 90 tablet, Rfl: 3   mirabegron ER (MYRBETRIQ) 25 MG TB24 tablet, Take 1 tablet (25 mg total) by mouth daily., Disp: 30 tablet, Rfl: 5   oxyCODONE (OXY IR/ROXICODONE) 5 MG immediate release tablet, Take 1 tablet (5 mg total) by mouth every 4 (four) hours as needed for severe pain., Disp: 5 tablet, Rfl: 0   polyethylene glycol powder (GLYCOLAX/MIRALAX) 17 GM/SCOOP powder, Take 17 g by mouth daily. Drink 17g (1 scoop) dissolved in water per day., Disp: 255 g, Rfl: 0   rosuvastatin  (CRESTOR) 40 MG tablet, Take 1 tablet (40 mg total) by mouth daily., Disp: 90 tablet, Rfl: 3   traMADol (ULTRAM) 50 MG tablet, Take 1 tablet (50 mg total) by mouth every 12 (twelve) hours as needed., Disp: 60 tablet, Rfl: 2   vitamin B-12 (CYANOCOBALAMIN) 100 MCG tablet, Take 100 mcg by mouth daily., Disp: , Rfl:    VITAMIN D PO, Take by mouth., Disp: , Rfl:    Zoster Vaccine Adjuvanted (SHINGRIX) injection, Inject 0.5 mLs into the muscle., Disp: 0.5 mL, Rfl: 1   Objective There were no vitals filed for this visit.   Gen: NAD CV: S1 S2 RRR Lungs: Clear to auscultation bilaterally   Previous Pelvic Exam showed: POP-Q   -2.5                                            Aa   -2.5                                           Ba   -7.5                                              C    3.5                                            Gh   3.5                                            Pb   8                                            tvl    -2.5                                            Ap   -2.5  Bp                                                  D       Assessment/ Plan  The patient is a 62 y.o. year old with mixed incontinence scheduled to undergo Exam under anesthesia, midurethral sling, cystoscopy.     Alisha Folds, MD

## 2022-08-04 ENCOUNTER — Other Ambulatory Visit (HOSPITAL_COMMUNITY): Payer: Self-pay

## 2022-08-04 ENCOUNTER — Encounter (HOSPITAL_BASED_OUTPATIENT_CLINIC_OR_DEPARTMENT_OTHER): Payer: Self-pay | Admitting: Obstetrics and Gynecology

## 2022-08-04 ENCOUNTER — Other Ambulatory Visit: Payer: Self-pay

## 2022-08-04 NOTE — Progress Notes (Signed)
Spoke w/ via phone for pre-op interview: patient Lab needs dos: NA Lab results: NA COVID test: patient states asymptomatic no test needed. Arrive at 1130 08/10/22. NPO after MN except clear liquids.Clear liquids from MN until 1030 AM. Med rec completed. Medications to take morning of surgery: Synthroid Diabetic medication: NA Patient instructed no nail polish to be worn day of surgery. Patient instructed to bring photo id and insurance card day of surgery. Patient aware to have Driver (ride ) / caregiver for 24 hours after surgery. (Husband to drive.) Patient Special Instructions: NA Pre-Op special Instructions: NA Patient verbalized understanding of instructions that were given at this phone interview. Patient denies shortness of breath, chest pain, fever, cough at this phone interview.

## 2022-08-06 NOTE — Progress Notes (Signed)
On 08/05/2022 Alisha Reed is a 62 y.o. female had her FMLA form faxed to the [employer] from [employer]   Pt notified there will be a 10-14 day turn around for forms to be ready.  Pt notified  she will be contacted as soon as the from are ready and have been faxed.  Pre-op date: 07/31/22 Surgery date: 08/10/2022 Post op date:09/07/2022  - Pt is requesting 2 weeks off them wants to return to work with restrictions until her post op visit 09/07/2022.

## 2022-08-10 ENCOUNTER — Ambulatory Visit (HOSPITAL_BASED_OUTPATIENT_CLINIC_OR_DEPARTMENT_OTHER): Payer: Commercial Managed Care - PPO | Admitting: Anesthesiology

## 2022-08-10 ENCOUNTER — Encounter (HOSPITAL_BASED_OUTPATIENT_CLINIC_OR_DEPARTMENT_OTHER)
Admission: RE | Disposition: A | Discharge status: Home / Self Care | Payer: Self-pay | Source: Home / Self Care | Attending: Obstetrics and Gynecology

## 2022-08-10 ENCOUNTER — Encounter (HOSPITAL_BASED_OUTPATIENT_CLINIC_OR_DEPARTMENT_OTHER): Payer: Self-pay | Admitting: Obstetrics and Gynecology

## 2022-08-10 ENCOUNTER — Telehealth: Payer: Self-pay | Admitting: Obstetrics and Gynecology

## 2022-08-10 ENCOUNTER — Ambulatory Visit (HOSPITAL_BASED_OUTPATIENT_CLINIC_OR_DEPARTMENT_OTHER)
Admission: RE | Admit: 2022-08-10 | Discharge: 2022-08-10 | Disposition: A | Discharge status: Home / Self Care | Payer: Commercial Managed Care - PPO | Attending: Obstetrics and Gynecology | Admitting: Obstetrics and Gynecology

## 2022-08-10 DIAGNOSIS — N393 Stress incontinence (female) (male): Secondary | ICD-10-CM | POA: Diagnosis not present

## 2022-08-10 DIAGNOSIS — Z79890 Hormone replacement therapy (postmenopausal): Secondary | ICD-10-CM | POA: Insufficient documentation

## 2022-08-10 DIAGNOSIS — M8580 Other specified disorders of bone density and structure, unspecified site: Secondary | ICD-10-CM | POA: Diagnosis not present

## 2022-08-10 DIAGNOSIS — E7800 Pure hypercholesterolemia, unspecified: Secondary | ICD-10-CM | POA: Diagnosis not present

## 2022-08-10 DIAGNOSIS — K5909 Other constipation: Secondary | ICD-10-CM

## 2022-08-10 DIAGNOSIS — Z01818 Encounter for other preprocedural examination: Secondary | ICD-10-CM

## 2022-08-10 DIAGNOSIS — E039 Hypothyroidism, unspecified: Secondary | ICD-10-CM | POA: Diagnosis not present

## 2022-08-10 DIAGNOSIS — Z791 Long term (current) use of non-steroidal anti-inflammatories (NSAID): Secondary | ICD-10-CM | POA: Insufficient documentation

## 2022-08-10 DIAGNOSIS — M19041 Primary osteoarthritis, right hand: Secondary | ICD-10-CM | POA: Insufficient documentation

## 2022-08-10 DIAGNOSIS — K219 Gastro-esophageal reflux disease without esophagitis: Secondary | ICD-10-CM | POA: Insufficient documentation

## 2022-08-10 DIAGNOSIS — Z79899 Other long term (current) drug therapy: Secondary | ICD-10-CM | POA: Diagnosis not present

## 2022-08-10 DIAGNOSIS — M19042 Primary osteoarthritis, left hand: Secondary | ICD-10-CM | POA: Insufficient documentation

## 2022-08-10 HISTORY — PX: CYSTOSCOPY: SHX5120

## 2022-08-10 HISTORY — PX: BLADDER SUSPENSION: SHX72

## 2022-08-10 SURGERY — URETHROPEXY, USING TRANSVAGINAL TAPE
Anesthesia: General

## 2022-08-10 MED ORDER — PROPOFOL 10 MG/ML IV BOLUS
INTRAVENOUS | Status: AC
  Filled 2022-08-10: qty 20

## 2022-08-10 MED ORDER — ACETAMINOPHEN 500 MG PO TABS
1000.0000 mg | ORAL_TABLET | ORAL | Status: AC
  Administered 2022-08-10: 1000 mg via ORAL

## 2022-08-10 MED ORDER — PROPOFOL 1000 MG/100ML IV EMUL
INTRAVENOUS | Status: AC
  Filled 2022-08-10: qty 100

## 2022-08-10 MED ORDER — SODIUM CHLORIDE 0.9 % IR SOLN
Status: DC | PRN
  Administered 2022-08-10: 1000 mL via INTRAVESICAL
  Administered 2022-08-10: 500 mL

## 2022-08-10 MED ORDER — MIDAZOLAM HCL 2 MG/2ML IJ SOLN
INTRAMUSCULAR | Status: AC
  Filled 2022-08-10: qty 2

## 2022-08-10 MED ORDER — LIDOCAINE HCL (PF) 2 % IJ SOLN
INTRAMUSCULAR | Status: AC
  Filled 2022-08-10: qty 5

## 2022-08-10 MED ORDER — SCOPOLAMINE 1 MG/3DAYS TD PT72
1.0000 | MEDICATED_PATCH | TRANSDERMAL | Status: DC
  Administered 2022-08-10: 1.5 mg via TRANSDERMAL

## 2022-08-10 MED ORDER — OXYCODONE HCL 5 MG PO TABS
5.0000 mg | ORAL_TABLET | Freq: Once | ORAL | Status: AC | PRN
  Administered 2022-08-10: 5 mg via ORAL

## 2022-08-10 MED ORDER — MIDAZOLAM HCL 5 MG/5ML IJ SOLN
INTRAMUSCULAR | Status: DC | PRN
  Administered 2022-08-10: 2 mg via INTRAVENOUS

## 2022-08-10 MED ORDER — ONDANSETRON HCL 4 MG/2ML IJ SOLN
INTRAMUSCULAR | Status: DC | PRN
  Administered 2022-08-10: 4 mg via INTRAVENOUS

## 2022-08-10 MED ORDER — OXYCODONE HCL 5 MG PO TABS
ORAL_TABLET | ORAL | Status: AC
  Filled 2022-08-10: qty 1

## 2022-08-10 MED ORDER — KETOROLAC TROMETHAMINE 30 MG/ML IJ SOLN
INTRAMUSCULAR | Status: DC | PRN
  Administered 2022-08-10: 30 mg via INTRAVENOUS

## 2022-08-10 MED ORDER — KETOROLAC TROMETHAMINE 30 MG/ML IJ SOLN
INTRAMUSCULAR | Status: AC
  Filled 2022-08-10: qty 2

## 2022-08-10 MED ORDER — MIDAZOLAM HCL 2 MG/2ML IJ SOLN: 0.5000 mg | Freq: Once | INTRAMUSCULAR | Status: DC | PRN

## 2022-08-10 MED ORDER — PHENAZOPYRIDINE HCL 100 MG PO TABS
ORAL_TABLET | ORAL | Status: AC
  Filled 2022-08-10: qty 2

## 2022-08-10 MED ORDER — FENTANYL CITRATE (PF) 100 MCG/2ML IJ SOLN
INTRAMUSCULAR | Status: DC | PRN
  Administered 2022-08-10: 100 ug via INTRAVENOUS

## 2022-08-10 MED ORDER — DEXAMETHASONE SODIUM PHOSPHATE 10 MG/ML IJ SOLN
INTRAMUSCULAR | Status: DC | PRN
  Administered 2022-08-10: 10 mg via INTRAVENOUS

## 2022-08-10 MED ORDER — PHENAZOPYRIDINE HCL 100 MG PO TABS
200.0000 mg | ORAL_TABLET | ORAL | Status: AC
  Administered 2022-08-10: 200 mg via ORAL

## 2022-08-10 MED ORDER — PROPOFOL 500 MG/50ML IV EMUL
INTRAVENOUS | Status: AC
  Filled 2022-08-10: qty 50

## 2022-08-10 MED ORDER — LIDOCAINE-EPINEPHRINE 1 %-1:100000 IJ SOLN
INTRAMUSCULAR | Status: DC | PRN
  Administered 2022-08-10: 10 mL

## 2022-08-10 MED ORDER — FENTANYL CITRATE (PF) 100 MCG/2ML IJ SOLN: 25.0000 ug | INTRAMUSCULAR | Status: DC | PRN

## 2022-08-10 MED ORDER — ACETAMINOPHEN 500 MG PO TABS
ORAL_TABLET | ORAL | Status: AC
  Filled 2022-08-10: qty 2

## 2022-08-10 MED ORDER — PHENYLEPHRINE 80 MCG/ML (10ML) SYRINGE FOR IV PUSH (FOR BLOOD PRESSURE SUPPORT)
PREFILLED_SYRINGE | INTRAVENOUS | Status: AC
  Filled 2022-08-10: qty 10

## 2022-08-10 MED ORDER — PHENYLEPHRINE 80 MCG/ML (10ML) SYRINGE FOR IV PUSH (FOR BLOOD PRESSURE SUPPORT)
PREFILLED_SYRINGE | INTRAVENOUS | Status: DC | PRN
  Administered 2022-08-10 (×4): 80 ug via INTRAVENOUS
  Administered 2022-08-10: 160 ug via INTRAVENOUS

## 2022-08-10 MED ORDER — PROPOFOL 10 MG/ML IV BOLUS
INTRAVENOUS | Status: DC | PRN
  Administered 2022-08-10: 200 mg via INTRAVENOUS

## 2022-08-10 MED ORDER — POVIDONE-IODINE 10 % EX SWAB: 2.0000 | Freq: Once | CUTANEOUS | Status: DC

## 2022-08-10 MED ORDER — DEXAMETHASONE SODIUM PHOSPHATE 10 MG/ML IJ SOLN
INTRAMUSCULAR | Status: AC
  Filled 2022-08-10: qty 1

## 2022-08-10 MED ORDER — PROMETHAZINE HCL 25 MG/ML IJ SOLN: 6.2500 mg | INTRAMUSCULAR | Status: DC | PRN

## 2022-08-10 MED ORDER — FENTANYL CITRATE (PF) 100 MCG/2ML IJ SOLN
INTRAMUSCULAR | Status: AC
  Filled 2022-08-10: qty 2

## 2022-08-10 MED ORDER — ONDANSETRON HCL 4 MG/2ML IJ SOLN
INTRAMUSCULAR | Status: AC
  Filled 2022-08-10: qty 2

## 2022-08-10 MED ORDER — DIPHENHYDRAMINE HCL 50 MG/ML IJ SOLN
INTRAMUSCULAR | Status: DC | PRN
  Administered 2022-08-10: 12.5 mg via INTRAVENOUS

## 2022-08-10 MED ORDER — CEFAZOLIN SODIUM-DEXTROSE 2-4 GM/100ML-% IV SOLN
2.0000 g | INTRAVENOUS | Status: AC
  Administered 2022-08-10: 2 g via INTRAVENOUS

## 2022-08-10 MED ORDER — LACTATED RINGERS IV SOLN: INTRAVENOUS | Status: DC

## 2022-08-10 MED ORDER — CEFAZOLIN SODIUM-DEXTROSE 2-4 GM/100ML-% IV SOLN
INTRAVENOUS | Status: AC
  Filled 2022-08-10: qty 100

## 2022-08-10 MED ORDER — MEPERIDINE HCL 25 MG/ML IJ SOLN: 6.2500 mg | INTRAMUSCULAR | Status: DC | PRN

## 2022-08-10 MED ORDER — OXYCODONE HCL 5 MG/5ML PO SOLN: 5.0000 mg | Freq: Once | ORAL | Status: AC | PRN

## 2022-08-10 MED ORDER — LIDOCAINE 2% (20 MG/ML) 5 ML SYRINGE
INTRAMUSCULAR | Status: DC | PRN
  Administered 2022-08-10: 20 mg via INTRAVENOUS

## 2022-08-10 MED ORDER — DIPHENHYDRAMINE HCL 50 MG/ML IJ SOLN
INTRAMUSCULAR | Status: AC
  Filled 2022-08-10: qty 1

## 2022-08-10 MED ORDER — SCOPOLAMINE 1 MG/3DAYS TD PT72
MEDICATED_PATCH | TRANSDERMAL | Status: AC
  Filled 2022-08-10: qty 1

## 2022-08-10 SURGICAL SUPPLY — 36 items
ADH SKN CLS APL DERMABOND .7 (GAUZE/BANDAGES/DRESSINGS) ×1
AGENT HMST KT MTR STRL THRMB (HEMOSTASIS)
BLADE CLIPPER SENSICLIP SURGIC (BLADE) ×1 IMPLANT
BLADE SURG 15 STRL LF DISP TIS (BLADE) ×1 IMPLANT
BLADE SURG 15 STRL SS (BLADE) ×1
DERMABOND ADVANCED .7 DNX12 (GAUZE/BANDAGES/DRESSINGS) ×1 IMPLANT
ELECT REM PT RETURN 9FT ADLT (ELECTROSURGICAL)
ELECTRODE REM PT RTRN 9FT ADLT (ELECTROSURGICAL) IMPLANT
GAUZE 4X4 16PLY ~~LOC~~+RFID DBL (SPONGE) IMPLANT
GLOVE BIOGEL PI IND STRL 6.5 (GLOVE) ×1 IMPLANT
GLOVE ECLIPSE 6.0 STRL STRAW (GLOVE) ×1 IMPLANT
GOWN STRL REUS W/TWL LRG LVL3 (GOWN DISPOSABLE) ×1 IMPLANT
HIBICLENS CHG 4% 4OZ BTL (MISCELLANEOUS) ×1 IMPLANT
HOLDER FOLEY CATH W/STRAP (MISCELLANEOUS) ×1 IMPLANT
KIT TURNOVER CYSTO (KITS) ×1 IMPLANT
MANIFOLD NEPTUNE II (INSTRUMENTS) ×1 IMPLANT
NDL HYPO 22X1.5 SAFETY MO (MISCELLANEOUS) ×1 IMPLANT
NEEDLE HYPO 22X1.5 SAFETY MO (MISCELLANEOUS) ×1 IMPLANT
NS IRRIG 1000ML POUR BTL (IV SOLUTION) ×1 IMPLANT
PACK CYSTO (CUSTOM PROCEDURE TRAY) ×1 IMPLANT
PACK VAGINAL WOMENS (CUSTOM PROCEDURE TRAY) ×1 IMPLANT
RETRACTOR LONE STAR DISPOSABLE (INSTRUMENTS) ×1 IMPLANT
RETRACTOR STAY HOOK 5MM (MISCELLANEOUS) ×1 IMPLANT
SET IRRIG Y TYPE TUR BLADDER L (SET/KITS/TRAYS/PACK) ×1 IMPLANT
SLEEVE SCD COMPRESS KNEE MED (STOCKING) ×1 IMPLANT
SPIKE FLUID TRANSFER (MISCELLANEOUS) ×1 IMPLANT
SUCTION FRAZIER HANDLE 10FR (MISCELLANEOUS) ×1
SUCTION TUBE FRAZIER 10FR DISP (MISCELLANEOUS) ×1 IMPLANT
SURGIFLO W/THROMBIN 8M KIT (HEMOSTASIS) IMPLANT
SUT ABS MONO DBL WITH NDL 48IN (SUTURE) IMPLANT
SUT VIC AB 2-0 SH 27 (SUTURE) ×1
SUT VIC AB 2-0 SH 27XBRD (SUTURE) ×1 IMPLANT
SYR BULB EAR ULCER 3OZ GRN STR (SYRINGE) ×1 IMPLANT
SYS SLING ADV FIT BLUE TRNSVAG (Sling) IMPLANT
TOWEL OR 17X24 6PK STRL BLUE (TOWEL DISPOSABLE) ×1 IMPLANT
TRAY FOLEY W/BAG SLVR 14FR LF (SET/KITS/TRAYS/PACK) ×1 IMPLANT

## 2022-08-10 NOTE — OR Nursing (Signed)
Approx. 1430- 300cc sterite water instilled into bladder via cathetor, balloon deflated and foley cathetor removed. Patient assisted to restroom,voided 275cc yelloow urine with small blood. Assisted back to stretcher. Bladder scanned for 67cc, rescanned foe 58cc.  Dr. Wannetta Sender notified and instructed patient can go home without a cathetor. Darin Engels Rn

## 2022-08-10 NOTE — Anesthesia Procedure Notes (Signed)
Procedure Name: LMA Insertion Date/Time: 08/10/2022 12:44 PM  Performed by: Rogers Blocker, CRNAPre-anesthesia Checklist: Patient identified, Emergency Drugs available, Suction available and Patient being monitored Patient Re-evaluated:Patient Re-evaluated prior to induction Oxygen Delivery Method: Circle System Utilized Preoxygenation: Pre-oxygenation with 100% oxygen Induction Type: IV induction Ventilation: Mask ventilation without difficulty LMA: LMA inserted LMA Size: 4.0 Number of attempts: 1 Airway Equipment and Method: Bite block Placement Confirmation: positive ETCO2 Tube secured with: Tape Dental Injury: Teeth and Oropharynx as per pre-operative assessment

## 2022-08-10 NOTE — Discharge Instructions (Addendum)
POST OPERATIVE INSTRUCTIONS  General Instructions Recovery (not bed rest) will last approximately 6 weeks Walking is encouraged, but refrain from strenuous exercise/ housework/ heavy lifting for 2 weeks. No lifting >10lbs  Nothing in the vagina- NO intercourse, tampons or douching for 6 weeks Bathing:  Do not submerge in water (NO swimming, bath, hot tub, etc) until after your postop visit. You can shower starting the day after surgery.  No driving until you are not taking narcotic pain medicine and until your pain is well enough controlled that you can slam on the breaks or make sudden movements if needed.   Taking your medications Please take your acetaminophen and ibuprofen on a schedule for the first 48 hours. Take '600mg'$  ibuprofen, then take '500mg'$  acetaminophen 3 hours later, then continue to alternate ibuprofen and acetaminophen. That way you are taking each type of medication every 6 hours. Take the prescribed narcotic (oxycodone, tramadol, etc) as needed, with a maximum being every 4 hours.  Take a stool softener daily to keep your stools soft and preventing you from straining. If you have diarrhea, you decrease your stool softener. This is explained more below. We have prescribed you Miralax.  Reasons to Call the Nurse (see last page for phone numbers) Heavy Bleeding (changing your pad every 1-2 hours) Persistent nausea/vomiting Fever (100.4 degrees or more) Incision problems (pus or other fluid coming out, redness, warmth, increased pain)  Things to Expect After Surgery Mild to Moderate pain is normal during the first day or two after surgery. If prescribed, take Ibuprofen or Tylenol first and use the stronger medicine for "break-through" pain. You can overlap these medicines because they work differently.   Constipation   To Prevent Constipation:  Eat a well-balanced diet including protein, grains, fresh fruit and vegetables.  Drink plenty of fluids. Walk regularly.  Depending on  specific instructions from your physician: take Miralax daily and additionally you can add a stool softener (colace/ docusate) and fiber supplement. Continue as long as you're on pain medications.   To Treat Constipation:  If you do not have a bowel movement in 2 days after surgery, you can take 2 Tbs of Milk of Magnesia 1-2 times a day until you have a bowel movement. If diarrhea occurs, decrease the amount or stop the laxative. If no results with Milk of Magnesia, you can drink a bottle of magnesium citrate which you can purchase over the counter.  Fatigue:  This is a normal response to surgery and will improve with time.  Plan frequent rest periods throughout the day.  Gas Pain:  This is very common but can also be very painful! Drink warm liquids such as herbal teas, bouillon or soup. Walking will help you pass more gas.  Mylicon or Gas-X can be taken over the counter.  Leaking Urine:  Varying amounts of leakage may occur after surgery.  This should improve with time. Your bladder needs at least 3 months to recover from surgery. If you leak after surgery, be sure to mention this to your doctor at your post-op visit. If you were taking medications for overactive bladder prior to surgery, be sure to restart the medications immediately after surgery.  Incisions: If you have incisions on your abdomen, the skin glue will dissolve on its own over time. It is ok to gently rinse with soap and water over these incisions but do not scrub.  Catheter Approximately 50% of patients are unable to urinate after surgery and need to go home with a  catheter. This allows your bladder to rest so it can return to full function. If you go home with a catheter, the office will call to set up a voiding trial a few days after surgery. For most patients, by this visit, they are able to urinate on their own. Long term catheter use is rare.   Return to Work  As work demands and recovery times vary widely, it is hard to  predict when you will want to return to work. If you have a desk job with no strenuous physical activity, and if you would like to return sooner than generally recommended, discuss this with your provider or call our office.   Post op concerns  For non-emergent issues, please call the Urogynecology Nurse. Please leave a message and someone will contact you within one business day.  You can also send a message through [place].   AFTER HOURS (After 5:00 PM and on weekends):  For urgent matters that cannot wait until the next business day. Call our office [phone] and connect to the doctor on call.  Please reserve this for important issues.   **FOR ANY TRUE EMERGENCY ISSUES CALL 911 OR GO TO THE NEAREST EMERGENCY ROOM.** Please inform our office or the doctor on call of any emergency.     APPOINTMENTS: Call [phone]  Post Anesthesia Home Care Instructions  Activity: Get plenty of rest for the remainder of the day. A responsible adult should stay with you for 24 hours following the procedure.  For the next 24 hours, DO NOT: -Drive a car -Paediatric nurse -Drink alcoholic beverages -Take any medication unless instructed by your physician -Make any legal decisions or sign important papers.  Meals: Start with liquid foods such as gelatin or soup. Progress to regular foods as tolerated. Avoid greasy, spicy, heavy foods. If nausea and/or vomiting occur, drink only clear liquids until the nausea and/or vomiting subsides. Call your physician if vomiting continues.  Special Instructions/Symptoms: Your throat may feel dry or sore from the anesthesia or the breathing tube placed in your throat during surgery. If this causes discomfort, gargle with warm salt water. The discomfort should disappear within 24 hours.  If you had a scopolamine patch placed behind your ear for the management of post- operative nausea and/or vomiting:  1. The medication in the patch is effective for 72 hours,  after which it should be removed.  Wrap patch in a tissue and discard in the trash. Wash hands thoroughly with soap and water. 2. You may remove the patch earlier than 72 hours if you experience unpleasant side effects which may include dry mouth, dizziness or visual disturbances. 3. Avoid touching the patch. Wash your hands with soap and water after contact with the patch.

## 2022-08-10 NOTE — Op Note (Signed)
Operative Note  Preoperative Diagnosis: stress urinary incontinence  Postoperative Diagnosis: same  Procedures performed:  Midurethral sling (Advantage Fit), cystoscopy  Implants:  Implant Name Type Inv. Item Serial No. Manufacturer Lot No. LRB No. Used Action  SYS SLING ADV FIT BLUE TRNSVAG - [id number] Sling SYS SLING ADV FIT [id number] N/A 1 Implanted    Attending Surgeon: Sherlene Shams, MD  Assistant Surgeon: Tommie Sams, MD PGY4  Anesthesia: General LMA  Findings: On cystoscopy, normal bladder and urethra without injury, lesion or foreign body. Brisk bilateral ureteral efflux noted.   Specimens: none  Estimated blood loss: 25 mL  IV fluids: 800 mL  Urine output: see flowsheet  Complications: none  Procedure in Detail:  After informed consent was obtained, the patient was taken to the operating room where she was placed under anesthesia.  She was then placed in the dorsal lithotomy position with allen stirrups,  and prepped and draped in the usual sterile fashion.  A strap was placed across her upper abdomen on top of her gown so it was not in direct contact with her skin.  Care was taken to avoid hyperflexion or hyperextension of her upper and lower extremities. A foley catheter was placed.  A lonestar self-retraining retractor was placed with 4 stay hooks. The mid urethral area was located on the anterior vaginal wall.  Two Allis clamps were placed at the level of the midurethra. 1% lidocaine with epinephrine was injected into the vaginal mucosa. A vertical incision was made between the two clamps using a 15-blade scalpel.  Using sharp dissection, Metzenbaum scissors were used to make a periurethral tunnel from the vaginal incision towards the pubic rami bilaterally for the future sling tracts. The bladder was ensured to be empty. The trocar and attached sling were introduced into the right side of the periurethral vaginal incision, just  inferior to the pubic symphysis on the right side. The trocar was guided through the endopelvic fascia and directly vertically.  While hugging the cephalad surface of the pubic bone, the trocar was guided out through the abdomen 2 fingerbreadths lateral to midline at the level of the pubic symphysis on the ipsilateral side. The trocar was placed on the left side in a similar fashion.  A 70-degree cystoscope was introduced, and 360-degree inspection revealed no trauma or trocars in the bladder, with brisk bilateral ureteral efflux.  The bladder was drained and the cystoscope was removed.  The Foley catheter was reinserted.  The sling was brought to lie beneath the mid-urethra.  A needle driver was placed behind the sling to ensure no tension.   The plastic sheath was removed from the sling and the distal ends of the sling were trimmed just below the level of the skin incisions.  Tension-free positioning of the sling was confirmed. Vaginal inspection revealed no vaginotomy or sling perforations of the mucosa.  The vaginal mucosal edges were reapproximated using 2-0 Vicryl in a running fashion.  The vagina was copiously irrigated.  Hemostasis was again noted. Vaginal packing not placed. The suprapubic sling incisions were closed with Dermabond. The patient tolerated the procedure well.  She was awakened from anesthesia and transferred to the recovery room in stable condition. All needle and sponge counts were correct x 2.    Jaquita Folds, MD

## 2022-08-10 NOTE — Anesthesia Postprocedure Evaluation (Signed)
Anesthesia Post Note  Patient: Alisha Reed  Procedure(s) Performed: TRANSVAGINAL TAPE (TVT) PROCEDURE CYSTOSCOPY     Patient location during evaluation: Phase II Anesthesia Type: General Level of consciousness: awake and alert, patient cooperative and oriented Pain management: pain level controlled Vital Signs Assessment: post-procedure vital signs reviewed and stable Respiratory status: spontaneous breathing, nonlabored ventilation and respiratory function stable Cardiovascular status: blood pressure returned to baseline and stable Postop Assessment: no apparent nausea or vomiting and able to ambulate Anesthetic complications: no   No notable events documented.  Last Vitals:  Vitals:   08/10/22 1330 08/10/22 1355  BP: 134/70   Pulse: 91 82  Resp: 14 16  Temp:  36.4 C  SpO2: 93% 100%    Last Pain:  Vitals:   08/10/22 1127  TempSrc: Oral  PainSc: 0-No pain                 [name],[name]

## 2022-08-10 NOTE — Interval H&P Note (Signed)
History and Physical Interval Note:  08/10/2022 12:07 PM  Alisha Reed  has presented today for surgery, with the diagnosis of stress urinary incontinence.  The various methods of treatment have been discussed with the patient and family. After consideration of risks, benefits and other options for treatment, the patient has consented to  Procedure(s) with comments: TRANSVAGINAL TAPE (TVT) PROCEDURE (N/A) CYSTOSCOPY (N/A) as a surgical intervention.  The patient's history has been reviewed, patient examined, no change in status, stable for surgery.  I have reviewed the patient's chart and labs.  Questions were answered to the patient's satisfaction.     Jaquita Folds

## 2022-08-10 NOTE — Anesthesia Preprocedure Evaluation (Addendum)
Anesthesia Evaluation  Patient identified by MRN, date of birth, ID band Patient awake    Reviewed: Allergy & Precautions, NPO status , Patient's Chart, lab work & pertinent test results  History of Anesthesia Complications Negative for: history of anesthetic complications  Airway Mallampati: I  TM Distance: >3 FB Neck ROM: Full    Dental  (+) Dental Advisory Given   Pulmonary neg pulmonary ROS   breath sounds clear to auscultation       Cardiovascular negative cardio ROS  Rhythm:Regular Rate:Normal     Neuro/Psych negative neurological ROS     GI/Hepatic Neg liver ROS,GERD  Controlled,,  Endo/Other  Hypothyroidism  BMI 29.8  Renal/GU negative Renal ROS     Musculoskeletal  (+) Arthritis ,    Abdominal   Peds  Hematology negative hematology ROS (+)   Anesthesia Other Findings   Reproductive/Obstetrics                             Anesthesia Physical Anesthesia Plan  ASA: 2  Anesthesia Plan: General   Post-op Pain Management: Tylenol PO (pre-op)*   Induction: Intravenous  PONV Risk Score and Plan: 3 and Ondansetron, Dexamethasone and Scopolamine patch - Pre-op  Airway Management Planned: LMA  Additional Equipment: None  Intra-op Plan:   Post-operative Plan:   Informed Consent: I have reviewed the patients History and Physical, chart, labs and discussed the procedure including the risks, benefits and alternatives for the proposed anesthesia with the patient or authorized representative who has indicated his/her understanding and acceptance.     Dental advisory given  Plan Discussed with: CRNA and Surgeon  Anesthesia Plan Comments:         Anesthesia Quick Evaluation

## 2022-08-10 NOTE — Telephone Encounter (Signed)
Alisha Reed underwent midurethral sling, cystoscopy on 08/10/22.   She passed her voiding trial.  369ml was backfilled into the bladder Voided 253ml  PVR by bladder scan was 66ml.   She was discharged without a catheter. Please call her for a routine post op check. Thanks!  Alisha Folds, MD

## 2022-08-10 NOTE — Transfer of Care (Signed)
Immediate Anesthesia Transfer of Care Note  Patient: Alisha Reed  Procedure(s) Performed: TRANSVAGINAL TAPE (TVT) PROCEDURE CYSTOSCOPY  Patient Location: PACU  Anesthesia Type:General  Level of Consciousness: drowsy, patient cooperative, and responds to stimulation  Airway & Oxygen Therapy: Patient Spontanous Breathing  Post-op Assessment: Report given to RN and Post -op Vital signs reviewed and stable  Post vital signs: Reviewed and stable  Last Vitals:  Vitals Value Taken Time  BP    Temp    Pulse 91 08/10/22 1330  Resp 14 08/10/22 1330  SpO2 93 % 08/10/22 1330  Vitals shown include unvalidated device data.  Last Pain:  Vitals:   08/10/22 1127  TempSrc: Oral  PainSc: 0-No pain      Patients Stated Pain Goal: 5 ([id number])  Complications: No notable events documented.

## 2022-08-11 ENCOUNTER — Encounter (HOSPITAL_BASED_OUTPATIENT_CLINIC_OR_DEPARTMENT_OTHER): Payer: Self-pay | Admitting: Obstetrics and Gynecology

## 2022-08-11 NOTE — Telephone Encounter (Signed)
Called and spoke to patient:   Pain is moderate and has been taking tylenol. Reports soreness and has been walking.  Reports no bleeding Reports she has not yet had a bowel movement but has taken her Miralax.  Reports she has been able to pee normally and has not had any leakage.   Patient reports she will call if she has any concerns. States she may want to go back to work next week and will call or send MyChart message to let us know if she needs a work note for return to work.

## 2022-08-14 ENCOUNTER — Encounter: Payer: Self-pay | Admitting: Obstetrics and Gynecology

## 2022-08-17 ENCOUNTER — Encounter: Payer: Self-pay | Admitting: Obstetrics and Gynecology

## 2022-08-18 ENCOUNTER — Encounter: Payer: Self-pay | Admitting: Obstetrics and Gynecology

## 2022-08-18 NOTE — Telephone Encounter (Signed)
Message sent to Dr Florian Buff for review

## 2022-08-19 ENCOUNTER — Encounter (HOSPITAL_COMMUNITY): Payer: Self-pay | Admitting: Emergency Medicine

## 2022-08-19 ENCOUNTER — Emergency Department (HOSPITAL_COMMUNITY)
Admission: EM | Admit: 2022-08-19 | Discharge: 2022-08-19 | Disposition: A | Discharge status: Home / Self Care | Payer: Commercial Managed Care - PPO | Attending: Emergency Medicine | Admitting: Emergency Medicine

## 2022-08-19 ENCOUNTER — Emergency Department (HOSPITAL_COMMUNITY): Payer: Commercial Managed Care - PPO

## 2022-08-19 DIAGNOSIS — R0789 Other chest pain: Secondary | ICD-10-CM | POA: Diagnosis not present

## 2022-08-19 DIAGNOSIS — R079 Chest pain, unspecified: Secondary | ICD-10-CM | POA: Diagnosis not present

## 2022-08-19 LAB — CBC
HCT: 33.3 % — ABNORMAL LOW (ref 36.0–46.0)
Hemoglobin: 10.9 g/dL — ABNORMAL LOW (ref 12.0–15.0)
MCH: 30.9 pg (ref 26.0–34.0)
MCHC: 32.7 g/dL (ref 30.0–36.0)
MCV: 94.3 fL (ref 80.0–100.0)
Platelets: 352 10*3/uL (ref 150–400)
RBC: 3.53 MIL/uL — ABNORMAL LOW (ref 3.87–5.11)
RDW: 13.6 % (ref 11.5–15.5)
WBC: 8.8 10*3/uL (ref 4.0–10.5)
nRBC: 0 % (ref 0.0–0.2)

## 2022-08-19 LAB — BASIC METABOLIC PANEL
Anion gap: 10 (ref 5–15)
BUN: 18 mg/dL (ref 8–23)
CO2: 24 mmol/L (ref 22–32)
Calcium: 9 mg/dL (ref 8.9–10.3)
Chloride: 101 mmol/L (ref 98–111)
Creatinine, Ser: 0.84 mg/dL (ref 0.44–1.00)
GFR, Estimated: >60 mL/min (ref >60)
Glucose, Bld: 96 mg/dL (ref 70–99)
Potassium: 4 mmol/L (ref 3.5–5.1)
Sodium: 135 mmol/L (ref 135–145)

## 2022-08-19 LAB — TROPONIN I (HIGH SENSITIVITY)
Troponin I (High Sensitivity): <2 ng/L (ref <18)
Troponin I (High Sensitivity): <2 ng/L (ref <18)

## 2022-08-19 LAB — CBG MONITORING, ED: Glucose-Capillary: 95 mg/dL (ref 70–99)

## 2022-08-19 LAB — D-DIMER, QUANTITATIVE: D-Dimer, Quant: 0.35 ug{FEU}/mL (ref 0.00–0.50)

## 2022-08-19 MED ORDER — ASPIRIN 81 MG PO CHEW
324.0000 mg | CHEWABLE_TABLET | Freq: Once | ORAL | Status: AC
  Administered 2022-08-19: 324 mg via ORAL
  Filled 2022-08-19: qty 4

## 2022-08-19 MED ORDER — KETOROLAC TROMETHAMINE 30 MG/ML IJ SOLN
15.0000 mg | Freq: Once | INTRAMUSCULAR | Status: AC
  Administered 2022-08-19: 15 mg via INTRAVENOUS
  Filled 2022-08-19: qty 1

## 2022-08-19 NOTE — ED Provider Notes (Signed)
[place] EMERGENCY DEPARTMENT AT [facility] Provider Note   CSN: [id number] Arrival date & time: 08/19/22  0715     History  Chief Complaint  Patient presents with   Chest Pain    Alisha Reed is a 62 y.o. female.  HPI Patient with history of transvaginal tape procedure earlier this month presents with chest pain.  Pain began about 10 hours ago when she was getting ready for bed.  Since onset pain has been left upper chest, nonradiating, sore.  Minimal associated dyspnea, fever, nausea, vomiting.  She notes that her surgery was uncomplicated, she has been recovering well until the onset of symptoms. No cardiac history, no pulmonary history.    Home Medications Prior to Admission medications   Medication Sig Start Date End Date Taking? Authorizing Provider  acetaminophen (TYLENOL) 500 MG tablet Take 1 tablet (500 mg total) by mouth every 6 (six) hours as needed (pain). 07/31/22   Marguerita Beards, MD  calcium carbonate (OS-CAL - DOSED IN MG OF ELEMENTAL CALCIUM) 1250 (500 Ca) MG tablet Take 1 tablet by mouth 2 (two) times daily with a meal.    [provider]  Cholecalciferol (VITAMIN D) 50 MCG (2000 UT) CAPS Take by mouth.    [provider]  diclofenac (VOLTAREN) 75 MG EC tablet Take 1 tablet (75 mg total) by mouth 2 (two) times daily. 06/12/22   Cristie Hem, PA-C  estradiol (ESTRACE) 1 MG tablet Take 1 tablet by mouth daily 12/03/21   Corwin Levins, MD  estradiol (ESTRACE) 1 MG tablet 1 tab by mouth daily 04/20/22     fluticasone (FLONASE) 50 MCG/ACT nasal spray Place 2 sprays into both nostrils daily. 05/10/20   Muthersbaugh, Dahlia Client, PA-C  levothyroxine (SYNTHROID) 75 MCG tablet Take 1 tablet (75 mcg total) by mouth daily before breakfast. 12/03/21   Corwin Levins, MD  oxyCODONE (OXY IR/ROXICODONE) 5 MG immediate release tablet Take 1 tablet (5 mg total) by mouth every 4 (four) hours as needed for severe pain. 07/31/22   Marguerita Beards,  MD  polyethylene glycol powder (GLYCOLAX/MIRALAX) 17 GM/SCOOP powder Take 17 g by mouth daily. Drink 17g (1 scoop) dissolved in water per day. 07/31/22   Marguerita Beards, MD  rosuvastatin (CRESTOR) 40 MG tablet Take 1 tablet (40 mg total) by mouth daily. 12/03/21   Corwin Levins, MD  traMADol (ULTRAM) 50 MG tablet Take 1 tablet (50 mg total) by mouth every 12 (twelve) hours as needed. 04/21/22   Cristie Hem, PA-C  vitamin B-12 (CYANOCOBALAMIN) 100 MCG tablet Take 100 mcg by mouth daily.    [provider]      Allergies    Patient has no known allergies.    Review of Systems   Review of Systems  All other systems reviewed and are negative.   Physical Exam Updated Vital Signs BP 125/60   Pulse 72   Temp 98.2 F (36.8 C)   Resp 15   SpO2 99%  Physical Exam Vitals and nursing note reviewed.  Constitutional:      General: She is not in acute distress.    Appearance: She is well-developed.  HENT:     Head: Normocephalic and atraumatic.  Eyes:     Conjunctiva/sclera: Conjunctivae normal.  Cardiovascular:     Rate and Rhythm: Normal rate and regular rhythm.  Pulmonary:     Effort: Pulmonary effort is normal. No respiratory distress.     Breath sounds: Normal breath  sounds. No stridor.  Chest:    Abdominal:     General: There is no distension.  Skin:    General: Skin is warm and dry.  Neurological:     Mental Status: She is alert and oriented to person, place, and time.     Cranial Nerves: No cranial nerve deficit.  Psychiatric:        Mood and Affect: Mood normal.     ED Results / Procedures / Treatments   Labs (all labs ordered are listed, but only abnormal results are displayed) Labs Reviewed  CBC - Abnormal; Notable for the following components:      Result Value   RBC 3.53 (*)    Hemoglobin 10.9 (*)    HCT 33.3 (*)    All other components within normal limits  BASIC METABOLIC PANEL  D-DIMER, QUANTITATIVE  CBG MONITORING, ED  TROPONIN I  (HIGH SENSITIVITY)  TROPONIN I (HIGH SENSITIVITY)    EKG EKG Interpretation  Date/Time:  Wednesday August 19 2022 07:34:37 EDT Ventricular Rate:  74 PR Interval:  145 QRS Duration: 97 QT Interval:  375 QTC Calculation: 416 R Axis:   63 Text Interpretation: Sinus rhythm Low voltage, precordial leads Baseline wander in lead(s) V3 Confirmed by Gerhard Munch [phone]) on 08/19/2022 7:55:02 AM  Radiology DG Chest Portable 1 View  Result Date: 08/19/2022 CLINICAL DATA:  Chest pain that started last night EXAM: PORTABLE CHEST 1 VIEW COMPARISON:  07/21/2018 FINDINGS: Artifact from EKG leads. Normal heart size and mediastinal contours. No acute infiltrate or edema. No effusion or pneumothorax. No acute osseous findings. IMPRESSION: Negative portable chest. Electronically Signed   By: Tiburcio Pea M.D.   On: 08/19/2022 07:51    Procedures Procedures    Medications Ordered in ED Medications  aspirin chewable tablet 324 mg (324 mg Oral Given 08/19/22 0750)  ketorolac (TORADOL) 30 MG/ML injection 15 mg (15 mg Intravenous Given 08/19/22 0750)    ED Course/ Medical Decision Making/ A&P                             Medical Decision Making Patient with history of recent surgery presents with chest pain.  Patient is awake, alert, speaking clearly.  Vitals unremarkable, with pulse ox 100% room air, cardiac 80 sinus normal Differential includes ACS, pneumonia, pneumothorax, musculoskeletal.  PE a consideration given recent surgical procedure, the vital signs do not suggest this.   Amount and/or Complexity of Data Reviewed Independent Historian: spouse External Data Reviewed: notes.    Details: Surgical notes from last week reviewed Labs: ordered. Decision-making details documented in ED Course. Radiology: ordered and independent interpretation performed. Decision-making details documented in ED Course. ECG/medicine tests: ordered and independent interpretation performed. Decision-making  details documented in ED Course.  Risk OTC drugs. Prescription drug management. Decision regarding hospitalization.   10:23 AM Symptoms resolved with Toradol.  Troponin normal x 2, EKG nonischemic, D-dimer negative, chest x-ray unremarkable.  Given these reassuring findings, little evidence for atypical ACS, PE, pneumothorax or other acute phenomenon.  Suspicion for musculoskeletal etiology.         Final Clinical Impression(s) / ED Diagnoses Final diagnoses:  Atypical chest pain    Rx / DC Orders ED Discharge Orders     None         Gerhard Munch, MD 08/19/22 1023

## 2022-08-19 NOTE — Discharge Instructions (Signed)
As discussed, your evaluation today has been largely reassuring.  But, it is important that you monitor your condition carefully, and do not hesitate to return to the ED if you develop new, or concerning changes in your condition.  Anti-inflammatory medication such as ibuprofen, 400 mg, taken 3 times daily will be beneficial for your pain.

## 2022-08-19 NOTE — ED Triage Notes (Signed)
Pt here from home with c/o chest pain that started last night radiates down left arm , no n/v or sob

## 2022-08-20 ENCOUNTER — Encounter: Payer: Self-pay | Admitting: Obstetrics and Gynecology

## 2022-08-20 ENCOUNTER — Encounter: Payer: Self-pay | Admitting: Internal Medicine

## 2022-08-20 ENCOUNTER — Telehealth: Payer: Self-pay

## 2022-08-20 ENCOUNTER — Other Ambulatory Visit: Payer: Self-pay | Admitting: Physician Assistant

## 2022-08-20 MED ORDER — METHOCARBAMOL 750 MG PO TABS: 750.0000 mg | ORAL_TABLET | Freq: Three times a day (TID) | ORAL | 2 refills | Status: DC | PRN

## 2022-08-20 NOTE — Transitions of Care (Post Inpatient/ED Visit) (Signed)
   08/20/2022  Name: Alisha Reed MRN: [id number] DOB: [birth year]  Today's TOC FU Call Status: Today's TOC FU Call Status:: Successful TOC FU Call Competed TOC FU Call Complete Date: 08/20/22  Transition Care Management Follow-up Telephone Call Date of Discharge: 08/19/22 [facility] Type of Discharge: Emergency Department Reason for ED Visit: Other: (other chest pain) How have you been since you were released from the hospital?: Better Any questions or concerns?: No  Items Reviewed: Did you receive and understand the discharge instructions provided?: Yes Medications obtained and verified?: Yes (Medications Reviewed) Any new allergies since your discharge?: No Dietary orders reviewed?: Yes Do you have support at home?: Yes People in Home: spouse  Home Care and Equipment/Supplies: Were Home Health Services Ordered?: NA Any new equipment or medical supplies ordered?: NA  Functional Questionnaire: Do you need assistance with bathing/showering or dressing?: No Do you need assistance with meal preparation?: No Do you need assistance with eating?: No Do you have difficulty maintaining continence: No Do you need assistance with getting out of bed/getting out of a chair/moving?: No Do you have difficulty managing or taking your medications?: No  Follow up appointments reviewed: PCP Follow-up appointment confirmed?: NA Specialist Hospital Follow-up appointment confirmed?: NA Do you need transportation to your follow-up appointment?: No Do you understand care options if your condition(s) worsen?: Yes-patient verbalized understanding    SIGNATURE Karena Addison, LPN [facility] Nurse Health Advisor Direct Dial [phone]

## 2022-08-21 ENCOUNTER — Other Ambulatory Visit (HOSPITAL_COMMUNITY): Payer: Self-pay

## 2022-08-23 ENCOUNTER — Encounter: Payer: Self-pay | Admitting: Obstetrics and Gynecology

## 2022-08-24 ENCOUNTER — Other Ambulatory Visit (HOSPITAL_COMMUNITY): Payer: Self-pay

## 2022-08-24 ENCOUNTER — Ambulatory Visit (INDEPENDENT_AMBULATORY_CARE_PROVIDER_SITE_OTHER): Payer: Commercial Managed Care - PPO

## 2022-08-24 ENCOUNTER — Other Ambulatory Visit (HOSPITAL_COMMUNITY)
Admission: RE | Admit: 2022-08-24 | Discharge: 2022-08-24 | Disposition: A | Discharge status: Home / Self Care | Payer: Commercial Managed Care - PPO | Source: Ambulatory Visit | Attending: Obstetrics and Gynecology | Admitting: Obstetrics and Gynecology

## 2022-08-24 DIAGNOSIS — R350 Frequency of micturition: Secondary | ICD-10-CM | POA: Diagnosis not present

## 2022-08-24 DIAGNOSIS — R82998 Other abnormal findings in urine: Secondary | ICD-10-CM | POA: Insufficient documentation

## 2022-08-24 LAB — POCT URINALYSIS DIPSTICK
Bilirubin, UA: NEGATIVE
Glucose, UA: NEGATIVE
Ketones, UA: NEGATIVE
Nitrite, UA: POSITIVE
Protein, UA: POSITIVE — AB
Spec Grav, UA: >=1.03 — AB (ref 1.010–1.025)
Urobilinogen, UA: 0.2 E.U./dL
pH, UA: 7 (ref 5.0–8.0)

## 2022-08-24 MED ORDER — PHENAZOPYRIDINE HCL 200 MG PO TABS
200.0000 mg | ORAL_TABLET | Freq: Three times a day (TID) | ORAL | 0 refills | Status: DC | PRN
  Filled 2022-08-24: qty 10, 4d supply, fill #0

## 2022-08-24 MED ORDER — NITROFURANTOIN MONOHYD MACRO 100 MG PO CAPS: 100.0000 mg | ORAL_CAPSULE | Freq: Two times a day (BID) | ORAL | 0 refills | Status: DC

## 2022-08-24 MED ORDER — PHENAZOPYRIDINE HCL 200 MG PO TABS: 200.0000 mg | ORAL_TABLET | Freq: Three times a day (TID) | ORAL | 0 refills | Status: DC | PRN

## 2022-08-24 MED ORDER — NITROFURANTOIN MONOHYD MACRO 100 MG PO CAPS
100.0000 mg | ORAL_CAPSULE | Freq: Two times a day (BID) | ORAL | 0 refills | Status: DC
  Filled 2022-08-24: qty 10, 5d supply, fill #0

## 2022-08-24 NOTE — Progress Notes (Unsigned)
Zenith Diss is a 62 y.o. female arrived today with UTI sx.  Per Dr. Jari Favre protocol: A urine specimen was collected and POCT Urine was done and urine culture sent to the lab. POCT Urine was POSITIVE.  Pt was notified and prescription sent to the preferred pharmacy.

## 2022-08-24 NOTE — Patient Instructions (Addendum)
Your Urine dip that was done in office was . I am sending the urine off for culture and you can take AZO over the counter for your discomfort.  We have ordered Pyridium and Macrobid for you to take while we wait for your culture results, hopefully this gives you some relief. We will contact you when the results are back between 3-5 days. If a different antibiotic is needed we will sent the order to the pharmacy and you will be notified. If you have any questions or concerns please feel free to call us at [phone]

## 2022-08-25 LAB — URINE CULTURE: Culture: >=100000 — AB

## 2022-08-26 ENCOUNTER — Other Ambulatory Visit (HOSPITAL_COMMUNITY): Payer: Self-pay

## 2022-08-26 ENCOUNTER — Other Ambulatory Visit: Payer: Self-pay | Admitting: Obstetrics and Gynecology

## 2022-08-26 DIAGNOSIS — R82998 Other abnormal findings in urine: Secondary | ICD-10-CM

## 2022-08-26 LAB — URINE CULTURE

## 2022-08-26 MED ORDER — SULFAMETHOXAZOLE-TRIMETHOPRIM 800-160 MG PO TABS
1.0000 | ORAL_TABLET | Freq: Two times a day (BID) | ORAL | 0 refills | Status: AC
Start: 2022-08-26 — End: 2022-08-30
  Filled 2022-08-26: qty 6, 3d supply, fill #0

## 2022-08-26 NOTE — Progress Notes (Signed)
Pt has been notified.

## 2022-08-26 NOTE — Progress Notes (Signed)
Patient has been notified

## 2022-09-07 ENCOUNTER — Ambulatory Visit (INDEPENDENT_AMBULATORY_CARE_PROVIDER_SITE_OTHER): Payer: Commercial Managed Care - PPO | Admitting: Obstetrics and Gynecology

## 2022-09-07 ENCOUNTER — Encounter: Payer: Self-pay | Admitting: Obstetrics and Gynecology

## 2022-09-07 VITALS — BP 136/82 | HR 71

## 2022-09-07 DIAGNOSIS — Z9889 Other specified postprocedural states: Secondary | ICD-10-CM

## 2022-09-07 DIAGNOSIS — N393 Stress incontinence (female) (male): Secondary | ICD-10-CM

## 2022-09-07 NOTE — Progress Notes (Signed)
 Urogynecology  Date of Visit: 09/07/2022  History of Present Illness: Alisha Reed is a 62 y.o. female scheduled today for a post-operative visit.   Surgery: s/p Midurethral sling (Advantage Fit), cystoscopy on 08/10/22  She passed her postoperative void trial.   Postoperative course has been uncomplicated.   Today she reports sometimes she feels a lot better, good success with the sling. She has occasional leakage, maybe a drop with activity, but nothing like she used to have. Stopped taking the myrbetriq, was not helping and does not feel like she needs it.   UTI in the last 6 weeks? Yes - treated on 4/15, no further symptoms  Pain? No  She has returned to her normal activity (except for postop restrictions) Stress incontinence:  very smalll amount Urgency/frequency: No  Urge incontinence: No  Voiding dysfunction: No  Bowel issues: No    Medications: She has a current medication list which includes the following prescription(s): calcium carbonate, vitamin d, diclofenac, estradiol, fluticasone, levothyroxine, methocarbamol, nitrofurantoin (macrocrystal-monohydrate), phenazopyridine, polyethylene glycol powder, rosuvastatin, tramadol, and vitamin b-12.   Allergies: Patient has No Known Allergies.   Physical Exam: BP 136/82   Pulse 71   Abdomen: soft, non-tender, without masses or organomegaly Suprapubic Incisions: healing well.  Pelvic Examination: Vagina: Incisions healing well. Sutures are present at incision line and there is not granulation tissue. No tenderness. No pelvic masses. No visible or palpable mesh.  ---------------------------------------------------------  Assessment and Plan:  1. Post-operative state   2. SUI (stress urinary incontinence, female)     - Well healed, minimal leakage- good outcome overall - Can resume regular activity including exercise. Wait an additional two weeks for intercourse.   Follow up as needed  Marguerita Beards,  MD

## 2022-09-27 ENCOUNTER — Other Ambulatory Visit: Payer: Self-pay | Admitting: Physician Assistant

## 2022-10-06 ENCOUNTER — Other Ambulatory Visit: Payer: Self-pay

## 2022-10-06 ENCOUNTER — Other Ambulatory Visit (HOSPITAL_COMMUNITY): Payer: Self-pay

## 2022-10-06 ENCOUNTER — Other Ambulatory Visit: Payer: Self-pay | Admitting: Physician Assistant

## 2022-10-06 MED ORDER — DICLOFENAC SODIUM 75 MG PO TBEC
75.0000 mg | DELAYED_RELEASE_TABLET | Freq: Two times a day (BID) | ORAL | 3 refills | Status: DC | PRN
  Filled 2022-10-06: qty 60, 30d supply, fill #0
  Filled 2022-11-05: qty 60, 30d supply, fill #1
  Filled 2022-12-08: qty 60, 30d supply, fill #2
  Filled 2023-01-12: qty 60, 30d supply, fill #3

## 2022-10-07 ENCOUNTER — Encounter: Payer: Self-pay | Admitting: Orthopaedic Surgery

## 2022-10-07 ENCOUNTER — Ambulatory Visit: Payer: Commercial Managed Care - PPO | Admitting: Orthopaedic Surgery

## 2022-10-07 DIAGNOSIS — M1711 Unilateral primary osteoarthritis, right knee: Secondary | ICD-10-CM

## 2022-10-07 MED ORDER — METHYLPREDNISOLONE ACETATE 40 MG/ML IJ SUSP
40.0000 mg | INTRAMUSCULAR | Status: AC | PRN
Start: 2022-10-07 — End: 2022-10-07
  Administered 2022-10-07: 40 mg via INTRA_ARTICULAR

## 2022-10-07 MED ORDER — BUPIVACAINE HCL 0.5 % IJ SOLN
2.0000 mL | INTRAMUSCULAR | Status: AC | PRN
Start: 2022-10-07 — End: 2022-10-07
  Administered 2022-10-07: 2 mL via INTRA_ARTICULAR

## 2022-10-07 MED ORDER — LIDOCAINE HCL 1 % IJ SOLN
2.0000 mL | INTRAMUSCULAR | Status: AC | PRN
Start: 2022-10-07 — End: 2022-10-07
  Administered 2022-10-07: 2 mL

## 2022-10-07 NOTE — Progress Notes (Signed)
Office Visit Note   Patient: Alisha Reed           Date of Birth: Nov 27, 1960           MRN: [id number] Visit Date: 10/07/2022              Requested by: Tarry Kos, MD [address] [place],  [place] [address] PCP: Corwin Levins, MD   Assessment & Plan: Visit Diagnoses:  1. Primary osteoarthritis of right knee     Plan: Impression is right knee osteoarthritis.  Cortisone injection injected today.  Patient tolerated this well.  Will see her back as needed.  Follow-Up Instructions: No follow-ups on file.   Orders:  No orders of the defined types were placed in this encounter.  No orders of the defined types were placed in this encounter.     Procedures: Large Joint Inj: R knee on 10/07/2022 8:11 AM Indications: pain Details: 22 G needle  Arthrogram: No  Medications: 40 mg methylPREDNISolone acetate 40 MG/ML; 2 mL lidocaine 1 %; 2 mL bupivacaine 0.5 % Consent was given by the patient. Patient was prepped and draped in the usual sterile fashion.       Clinical Data: No additional findings.   Subjective: Chief Complaint  Patient presents with   Right Knee - Pain    HPI Alisha Reed comes in today for follow-up on right knee osteoarthritis.  Her pain has gotten worse especially at night.  Requesting cortisone injection. Review of Systems   Objective: Vital Signs: There were no vitals taken for this visit.  Physical Exam  Ortho Exam Examination of the right knee shows no joint effusion.  No significant joint line tenderness.  She has deep-seated global pain. Specialty Comments:  No specialty comments available.  Imaging: No results found.   PMFS History: Patient Active Problem List   Diagnosis Date Noted   Rectal bleeding 03/13/2022   Other constipation 03/13/2022   Arthritis 12/03/2021   Stress incontinence 11/19/2020   S/p bilateral carpal tunnel release 03/28/2019   Chronic pain of right knee 12/29/2018   Right carpal tunnel syndrome  12/29/2018   Left carpal tunnel syndrome 12/29/2018   Osteopenia 11/08/2018   Pre-diabetes 05/02/2018   Acute pain of right shoulder 03/18/2018   Hyperlipidemia 11/04/2017   Vitamin D deficiency 11/04/2017   S/P cholecystectomy 08/02/2017   Skier's thumb, right, subsequent encounter 03/15/2017   Chronic left shoulder pain 02/08/2017   Nondisplaced fracture of greater tuberosity of left humerus, initial encounter for closed fracture 01/18/2017   Encounter for well adult exam with abnormal findings 09/24/2016   Hypothyroidism 09/24/2016   Past Medical History:  Diagnosis Date   Arthritis    hands,    Carpal tunnel syndrome on both sides    both hands surgeries 01/2019   GERD (gastroesophageal reflux disease)    Hypercholesterolemia    Hypothyroidism    Osteopenia 11/08/2018   Thyroid disease     Family History  Problem Relation Age of Onset   Scleroderma Mother    Rheum arthritis Mother    Heart disease Mother    Heart disease Father    Glaucoma Father    Stomach cancer Maternal Grandmother    Heart attack Maternal Grandfather    Alzheimer's disease Paternal Grandmother    Throat cancer Paternal Grandfather    Colon cancer Neg Hx    Liver cancer Neg Hx    Colon polyps Neg Hx    Esophageal cancer Neg Hx  Rectal cancer Neg Hx     Past Surgical History:  Procedure Laterality Date   BILATERAL CARPAL TUNNEL RELEASE Bilateral 01/18/2019   Procedure: BILATERAL CARPAL TUNNEL RELEASE;  Surgeon: Tarry Kos, MD;  Location: [facility];  Service: Orthopedics;  Laterality: Bilateral;   BLADDER SUSPENSION N/A 08/10/2022   Procedure: TRANSVAGINAL TAPE (TVT) PROCEDURE;  Surgeon: Marguerita Beards, MD;  Location: [facility];  Service: Gynecology;  Laterality: N/A;  Total time requested is 1 hour.   CHOLECYSTECTOMY     COLONOSCOPY  2011   CYSTOSCOPY N/A 08/10/2022   Procedure: CYSTOSCOPY;  Surgeon: Marguerita Beards, MD;  Location: [facility];  Service: Gynecology;  Laterality: N/A;   FOOT SURGERY Bilateral    HYSTERECTOMY ABDOMINAL WITH SALPINGECTOMY     NASAL SINUS SURGERY     Social History   Occupational History   Occupation: Charity fundraiser  Tobacco Use   Smoking status: Never   Smokeless tobacco: Never  Vaping Use   Vaping Use: Never used  Substance and Sexual Activity   Alcohol use: Yes    Comment: social   Drug use: No   Sexual activity: Yes    Birth control/protection: Surgical

## 2022-11-04 ENCOUNTER — Other Ambulatory Visit (HOSPITAL_COMMUNITY): Payer: Self-pay

## 2022-11-04 ENCOUNTER — Encounter: Payer: Self-pay | Admitting: Internal Medicine

## 2022-11-04 ENCOUNTER — Other Ambulatory Visit: Payer: Self-pay | Admitting: Physician Assistant

## 2022-11-04 MED ORDER — ALENDRONATE SODIUM 70 MG PO TABS
70.0000 mg | ORAL_TABLET | ORAL | 3 refills | Status: DC
  Filled 2022-11-04: qty 12, 84d supply, fill #0
  Filled 2023-02-14: qty 12, 84d supply, fill #1
  Filled 2023-05-06: qty 12, 84d supply, fill #2
  Filled 2023-08-01: qty 12, 84d supply, fill #3

## 2022-11-06 ENCOUNTER — Other Ambulatory Visit: Payer: Self-pay

## 2022-11-11 ENCOUNTER — Ambulatory Visit: Payer: Commercial Managed Care - PPO | Admitting: Orthopaedic Surgery

## 2022-12-02 ENCOUNTER — Encounter: Payer: Self-pay | Admitting: Internal Medicine

## 2022-12-02 ENCOUNTER — Other Ambulatory Visit (INDEPENDENT_AMBULATORY_CARE_PROVIDER_SITE_OTHER): Payer: Commercial Managed Care - PPO

## 2022-12-02 DIAGNOSIS — E538 Deficiency of other specified B group vitamins: Secondary | ICD-10-CM

## 2022-12-02 DIAGNOSIS — E7800 Pure hypercholesterolemia, unspecified: Secondary | ICD-10-CM

## 2022-12-02 DIAGNOSIS — R7303 Prediabetes: Secondary | ICD-10-CM | POA: Diagnosis not present

## 2022-12-02 DIAGNOSIS — E559 Vitamin D deficiency, unspecified: Secondary | ICD-10-CM | POA: Diagnosis not present

## 2022-12-02 LAB — URINALYSIS, ROUTINE W REFLEX MICROSCOPIC
Bilirubin Urine: NEGATIVE
Leukocytes,Ua: NEGATIVE
Nitrite: POSITIVE — AB
Specific Gravity, Urine: 1.02 (ref 1.000–1.030)
Total Protein, Urine: NEGATIVE
Urine Glucose: NEGATIVE
Urobilinogen, UA: 0.2 (ref 0.0–1.0)
pH: 6 (ref 5.0–8.0)

## 2022-12-02 LAB — CBC WITH DIFFERENTIAL/PLATELET
Basophils Absolute: 0.1 10*3/uL (ref 0.0–0.1)
Basophils Relative: 0.7 % (ref 0.0–3.0)
Eosinophils Absolute: 0.3 10*3/uL (ref 0.0–0.7)
Eosinophils Relative: 4.3 % (ref 0.0–5.0)
HCT: 40.3 % (ref 36.0–46.0)
Hemoglobin: 12.9 g/dL (ref 12.0–15.0)
Lymphocytes Relative: 29.9 % (ref 12.0–46.0)
Lymphs Abs: 2 10*3/uL (ref 0.7–4.0)
MCHC: 32 g/dL (ref 30.0–36.0)
MCV: 93.1 fl (ref 78.0–100.0)
Monocytes Absolute: 0.5 10*3/uL (ref 0.1–1.0)
Monocytes Relative: 7.2 % (ref 3.0–12.0)
Neutro Abs: 3.9 10*3/uL (ref 1.4–7.7)
Neutrophils Relative %: 57.9 % (ref 43.0–77.0)
Platelets: 414 10*3/uL — ABNORMAL HIGH (ref 150.0–400.0)
RBC: 4.33 Mil/uL (ref 3.87–5.11)
RDW: 14.5 % (ref 11.5–15.5)
WBC: 6.8 10*3/uL (ref 4.0–10.5)

## 2022-12-02 LAB — LIPID PANEL
Cholesterol: 227 mg/dL — ABNORMAL HIGH (ref 0–200)
HDL: 66.7 mg/dL (ref >39.00)
LDL Cholesterol: 124 mg/dL — ABNORMAL HIGH (ref 0–99)
NonHDL: 160.19
Total CHOL/HDL Ratio: 3
Triglycerides: 182 mg/dL — ABNORMAL HIGH (ref 0.0–149.0)
VLDL: 36.4 mg/dL (ref 0.0–40.0)

## 2022-12-02 LAB — HEPATIC FUNCTION PANEL
ALT: 11 U/L (ref 0–35)
AST: 13 U/L (ref 0–37)
Albumin: 4.1 g/dL (ref 3.5–5.2)
Alkaline Phosphatase: 54 U/L (ref 39–117)
Bilirubin, Direct: 0.1 mg/dL (ref 0.0–0.3)
Total Bilirubin: 0.4 mg/dL (ref 0.2–1.2)
Total Protein: 7.1 g/dL (ref 6.0–8.3)

## 2022-12-02 LAB — BASIC METABOLIC PANEL
BUN: 20 mg/dL (ref 6–23)
CO2: 27 mEq/L (ref 19–32)
Calcium: 9.8 mg/dL (ref 8.4–10.5)
Chloride: 101 mEq/L (ref 96–112)
Creatinine, Ser: 0.82 mg/dL (ref 0.40–1.20)
GFR: 76.01 mL/min (ref >60.00)
Glucose, Bld: 94 mg/dL (ref 70–99)
Potassium: 4.6 mEq/L (ref 3.5–5.1)
Sodium: 136 mEq/L (ref 135–145)

## 2022-12-02 LAB — TSH: TSH: 0.73 u[IU]/mL (ref 0.35–5.50)

## 2022-12-02 LAB — VITAMIN D 25 HYDROXY (VIT D DEFICIENCY, FRACTURES): VITD: 75.41 ng/mL (ref 30.00–100.00)

## 2022-12-02 LAB — HEMOGLOBIN A1C: Hgb A1c MFr Bld: 6.1 % (ref 4.6–6.5)

## 2022-12-02 LAB — VITAMIN B12: Vitamin B-12: 1059 pg/mL — ABNORMAL HIGH (ref 211–911)

## 2022-12-08 ENCOUNTER — Other Ambulatory Visit: Payer: Self-pay

## 2022-12-08 ENCOUNTER — Other Ambulatory Visit: Payer: Self-pay | Admitting: Internal Medicine

## 2022-12-08 ENCOUNTER — Other Ambulatory Visit (HOSPITAL_COMMUNITY): Payer: Self-pay

## 2022-12-08 MED ORDER — LEVOTHYROXINE SODIUM 75 MCG PO TABS
75.0000 ug | ORAL_TABLET | Freq: Every day | ORAL | 3 refills | Status: DC
  Filled 2022-12-08: qty 90, 90d supply, fill #0

## 2022-12-09 ENCOUNTER — Other Ambulatory Visit (HOSPITAL_COMMUNITY): Payer: Self-pay

## 2022-12-09 ENCOUNTER — Ambulatory Visit (INDEPENDENT_AMBULATORY_CARE_PROVIDER_SITE_OTHER): Payer: Commercial Managed Care - PPO | Admitting: Internal Medicine

## 2022-12-09 VITALS — BP 130/80 | HR 71 | Temp 98.7°F | Ht 63.0 in | Wt 169.0 lb

## 2022-12-09 DIAGNOSIS — R3129 Other microscopic hematuria: Secondary | ICD-10-CM | POA: Diagnosis not present

## 2022-12-09 DIAGNOSIS — Z0001 Encounter for general adult medical examination with abnormal findings: Secondary | ICD-10-CM | POA: Diagnosis not present

## 2022-12-09 DIAGNOSIS — M8580 Other specified disorders of bone density and structure, unspecified site: Secondary | ICD-10-CM

## 2022-12-09 DIAGNOSIS — E538 Deficiency of other specified B group vitamins: Secondary | ICD-10-CM

## 2022-12-09 DIAGNOSIS — E559 Vitamin D deficiency, unspecified: Secondary | ICD-10-CM

## 2022-12-09 DIAGNOSIS — R9431 Abnormal electrocardiogram [ECG] [EKG]: Secondary | ICD-10-CM | POA: Diagnosis not present

## 2022-12-09 DIAGNOSIS — J309 Allergic rhinitis, unspecified: Secondary | ICD-10-CM | POA: Diagnosis not present

## 2022-12-09 DIAGNOSIS — R7303 Prediabetes: Secondary | ICD-10-CM | POA: Diagnosis not present

## 2022-12-09 DIAGNOSIS — E7800 Pure hypercholesterolemia, unspecified: Secondary | ICD-10-CM | POA: Diagnosis not present

## 2022-12-09 DIAGNOSIS — E039 Hypothyroidism, unspecified: Secondary | ICD-10-CM

## 2022-12-09 MED ORDER — METHYLPREDNISOLONE ACETATE 80 MG/ML IJ SUSP
80.0000 mg | Freq: Once | INTRAMUSCULAR | Status: AC
Start: 2022-12-09 — End: 2022-12-09
  Administered 2022-12-09: 80 mg via INTRAMUSCULAR

## 2022-12-09 MED ORDER — ROSUVASTATIN CALCIUM 40 MG PO TABS
40.0000 mg | ORAL_TABLET | Freq: Every day | ORAL | 3 refills | Status: DC
  Filled 2022-12-09 – 2023-01-16 (×2): qty 90, 90d supply, fill #0
  Filled 2023-04-21: qty 90, 90d supply, fill #1
  Filled 2023-08-01: qty 90, 90d supply, fill #2
  Filled 2023-11-04: qty 90, 90d supply, fill #3

## 2022-12-09 MED ORDER — LEVOTHYROXINE SODIUM 75 MCG PO TABS
75.0000 ug | ORAL_TABLET | Freq: Every day | ORAL | 3 refills | Status: DC
  Filled 2022-12-09 – 2023-03-12 (×2): qty 90, 90d supply, fill #0
  Filled 2023-06-17: qty 90, 90d supply, fill #1
  Filled 2023-08-23 – 2023-08-30 (×3): qty 90, 90d supply, fill #2
  Filled 2023-11-04 – 2023-11-29 (×2): qty 90, 90d supply, fill #3

## 2022-12-09 MED ORDER — REPATHA SURECLICK 140 MG/ML ~~LOC~~ SOAJ
140.0000 mg | SUBCUTANEOUS | 3 refills | Status: DC
  Filled 2022-12-09: qty 6, 84d supply, fill #0
  Filled 2023-02-14: qty 6, 84d supply, fill #1

## 2022-12-09 NOTE — Progress Notes (Signed)
Patient ID: Alisha Reed, female   DOB: [birth year], 62 y.o.   MRN: [id number]         Chief Complaint:: wellness exam and Annual Exam (Pt is experiencing really bad allergies )  , allergies, microhematuria, low thyroid, hld, low vit d, preDM, osteopenia       HPI:  Alisha Reed is a 62 y.o. female here for wellness exam; decliens covid booster, o/w up to date                        Also plans to retire next yr. Does have several wks ongoing nasal allergy symptoms with clearish congestion, itch and sneezing, without fever, pain, ST, cough, swelling or wheezing, despite antihistamine and nasacort.   Pt denies chest pain, increased sob or doe, wheezing, orthopnea, PND, increased LE swelling, palpitations, dizziness or syncope.   Pt denies polydipsia, polyuria, or new focal neuro s/s.    Pt denies fever, wt loss, night sweats, loss of appetite, or other constitutional symptoms  Denies urinary symptoms such as dysuria, frequency, urgency, flank pain, hematuria or n/v, fever, chills.     Wt Readings from Last 3 Encounters:  12/09/22 169 lb (76.7 kg)  08/10/22 164 lb 8 oz (74.6 kg)  07/31/22 168 lb 9.6 oz (76.5 kg)   BP Readings from Last 3 Encounters:  12/09/22 130/80  09/07/22 136/82  08/19/22 125/60   Immunization History  Administered Date(s) Administered   COVID-19, mRNA, vaccine(Comirnaty)12 years and older 03/11/2022   Influenza,inj,Quad PF,6+ Mos 01/14/2018   Influenza-Unspecified 02/13/2019   PFIZER(Purple Top)SARS-COV-2 Vaccination 05/03/2019, 05/24/2019, 02/28/2020, 08/28/2020, 02/24/2021   PNEUMOCOCCAL CONJUGATE-20 11/15/2020   Tdap 11/04/2017   Zoster Recombinant(Shingrix) 09/25/2021, 12/03/2021   Health Maintenance Due  Topic Date Due   COVID-19 Vaccine (7 - 2023-24 season) 05/06/2022   INFLUENZA VACCINE  12/10/2022      Past Medical History:  Diagnosis Date   Arthritis    hands,    Carpal tunnel syndrome on both sides    both hands surgeries 01/2019   GERD  (gastroesophageal reflux disease)    Hypercholesterolemia    Hypothyroidism    Osteopenia 11/08/2018   Thyroid disease    Past Surgical History:  Procedure Laterality Date   BILATERAL CARPAL TUNNEL RELEASE Bilateral 01/18/2019   Procedure: BILATERAL CARPAL TUNNEL RELEASE;  Surgeon: Tarry Kos, MD;  Location: [facility];  Service: Orthopedics;  Laterality: Bilateral;   BLADDER SUSPENSION N/A 08/10/2022   Procedure: TRANSVAGINAL TAPE (TVT) PROCEDURE;  Surgeon: Marguerita Beards, MD;  Location: [facility];  Service: Gynecology;  Laterality: N/A;  Total time requested is 1 hour.   CHOLECYSTECTOMY     COLONOSCOPY  2011   CYSTOSCOPY N/A 08/10/2022   Procedure: CYSTOSCOPY;  Surgeon: Marguerita Beards, MD;  Location: [facility];  Service: Gynecology;  Laterality: N/A;   FOOT SURGERY Bilateral    HYSTERECTOMY ABDOMINAL WITH SALPINGECTOMY     NASAL SINUS SURGERY      reports that she has never smoked. She has never used smokeless tobacco. She reports current alcohol use. She reports that she does not use drugs. family history includes Alzheimer's disease in her paternal grandmother; Glaucoma in her father; Heart attack in her maternal grandfather; Heart disease in her father and mother; Rheum arthritis in her mother; Scleroderma in her mother; Stomach cancer in her maternal grandmother; Throat cancer in her paternal grandfather. No Known Allergies Current Outpatient Medications on File Prior  to Visit  Medication Sig Dispense Refill   alendronate (FOSAMAX) 70 MG tablet Take 1 tablet (70 mg total) by mouth every 7 (seven) days. Take with a full glass of water on an empty stomach. (Patient not taking: Reported on 12/09/2022) 12 tablet 3   calcium carbonate (OS-CAL - DOSED IN MG OF ELEMENTAL CALCIUM) 1250 (500 Ca) MG tablet Take 1 tablet by mouth 2 (two) times daily with a meal.     Cholecalciferol (VITAMIN D) 50 MCG (2000 UT) CAPS Take by mouth.      diclofenac (VOLTAREN) 75 MG EC tablet Take 1 tablet (75 mg total) by mouth 2 (two) times daily as needed. 60 tablet 3   estradiol (ESTRACE) 1 MG tablet 1 tab by mouth daily 90 tablet 4   fluticasone (FLONASE) 50 MCG/ACT nasal spray Place 2 sprays into both nostrils daily. 9.9 g 0   methocarbamol (ROBAXIN-750) 750 MG tablet Take 1 tablet (750 mg total) by mouth 3 (three) times daily as needed for muscle spasms. 20 tablet 2   traMADol (ULTRAM) 50 MG tablet Take 1 tablet (50 mg total) by mouth every 12 (twelve) hours as needed. 60 tablet 2   vitamin B-12 (CYANOCOBALAMIN) 100 MCG tablet Take 100 mcg by mouth daily.     No current facility-administered medications on file prior to visit.        ROS:  All others reviewed and negative.  Objective        PE:  BP 130/80 (BP Location: Right Arm, Patient Position: Sitting, Cuff Size: Normal)   Pulse 71   Temp 98.7 F (37.1 C) (Oral)   Ht 5\' 3"  (1.6 m)   Wt 169 lb (76.7 kg)   SpO2 98%   BMI 29.94 kg/m                 Constitutional: Pt appears in NAD               HENT: Head: NCAT.                Right Ear: External ear normal.                 Left Ear: External ear normal.                Eyes: . Pupils are equal, round, and reactive to light. Conjunctivae and EOM are normal               Nose: without d/c or deformity               Neck: Neck supple. Gross normal ROM               Cardiovascular: Normal rate and regular rhythm.                 Pulmonary/Chest: Effort normal and breath sounds without rales or wheezing.                Abd:  Soft, NT, ND, + BS, no organomegaly               Neurological: Pt is alert. At baseline orientation, motor grossly intact               Skin: Skin is warm. No rashes, no other new lesions, LE edema - none               Psychiatric: Pt behavior is normal without agitation   Micro: none  Cardiac tracings I  have personally interpreted today:  none  Pertinent Radiological findings (summarize): none    Lab Results  Component Value Date   WBC 6.8 12/02/2022   HGB 12.9 12/02/2022   HCT 40.3 12/02/2022   PLT 414.0 (H) 12/02/2022   GLUCOSE 94 12/02/2022   CHOL 227 (H) 12/02/2022   TRIG 182.0 (H) 12/02/2022   HDL 66.70 12/02/2022   LDLCALC 124 (H) 12/02/2022   ALT 11 12/02/2022   AST 13 12/02/2022   NA 136 12/02/2022   K 4.6 12/02/2022   CL 101 12/02/2022   CREATININE 0.82 12/02/2022   BUN 20 12/02/2022   CO2 27 12/02/2022   TSH 0.73 12/02/2022   HGBA1C 6.1 12/02/2022   Assessment/Plan:  Alisha Reed is a 62 y.o. White or Caucasian [1] female with  has a past medical history of Arthritis, Carpal tunnel syndrome on both sides, GERD (gastroesophageal reflux disease), Hypercholesterolemia, Hypothyroidism, Osteopenia (11/08/2018), and Thyroid disease.  Osteopenia Now on fosamax, advised pt to take for total 5 yrs  Encounter for well adult exam with abnormal findings Age and sex appropriate education and counseling updated with regular exercise and diet Referrals for preventative services - none needed Immunizations addressed - declines covid booster Smoking counseling  - none needed Evidence for depression or other mood disorder - none significant Most recent labs reviewed. I have personally reviewed and have noted: 1) the patient's medical and social history 2) The patient's current medications and supplements 3) The patient's height, weight, and BMI have been recorded in the chart   Hypothyroidism Lab Results  Component Value Date   TSH 0.73 12/02/2022   Stable, pt to continue levothyroxine 75 mcg qd   Hyperlipidemia Lab Results  Component Value Date   LDLCALC 124 (H) 12/02/2022   Uncontrolled, add repatha 140 q 2 wks, pt to continue low chol diet, also for card CT score   Vitamin D deficiency Last vitamin D Lab Results  Component Value Date   VD25OH 75.41 12/02/2022   Stable, cont oral replacement   Pre-diabetes Lab Results  Component Value Date    HGBA1C 6.1 12/02/2022   Stable, pt to continue current medical treatment  - diet, wt control   Allergic rhinitis Mild to mod, for depomedrol 80 mg IM,,  to f/u any worsening symptoms or concerns  Microhematuria Asympt, for CT abd /pelvis, f/u urology as planned  Followup: Return in about 1 year (around 12/09/2023).  Oliver Barre, MD 12/12/2022 9:24 PM [facility] Internal Medicine

## 2022-12-09 NOTE — Assessment & Plan Note (Signed)
Now on fosamax, advised pt to take for total 5 yrs

## 2022-12-09 NOTE — Patient Instructions (Addendum)
You had the steroid shot today  Please take all new medication as prescribed - the repatha shot for cholesterol  Please continue all other medications as before, and refills have been done if requested.  Please have the pharmacy call with any other refills you may need.  Please continue your efforts at being more active, low cholesterol diet, and weight control.  You are otherwise up to date with prevention measures today.  Please keep your appointments with your specialists as you may have planned  You will be contacted regarding the referral for: CT scan abd /pelvis for the blood in the urine, also the Cardiac CT Score testing for heart arteries  Please make an Appointment to return for your 1 year visit, or sooner if needed, with Lab testing by Appointment as well, to be done about 3-5 days before at the FIRST FLOOR Lab (so this is for TWO appointments - please see the scheduling desk as you leave)

## 2022-12-12 ENCOUNTER — Encounter: Payer: Self-pay | Admitting: Internal Medicine

## 2022-12-12 NOTE — Assessment & Plan Note (Signed)
Asympt, for CT abd /pelvis, f/u urology as planned

## 2022-12-12 NOTE — Assessment & Plan Note (Signed)
Lab Results  Component Value Date   TSH 0.73 12/02/2022   Stable, pt to continue levothyroxine 75 mcg qd

## 2022-12-12 NOTE — Assessment & Plan Note (Addendum)
Lab Results  Component Value Date   LDLCALC 124 (H) 12/02/2022   Uncontrolled, add repatha 140 q 2 wks, pt to continue low chol diet, also for card CT score

## 2022-12-12 NOTE — Assessment & Plan Note (Signed)
Mild to mod, for depomedrol 80 mg IM,,  to f/u any worsening symptoms or concerns

## 2022-12-12 NOTE — Assessment & Plan Note (Signed)
Last vitamin D Lab Results  Component Value Date   VD25OH 75.41 12/02/2022   Stable, cont oral replacement

## 2022-12-12 NOTE — Assessment & Plan Note (Signed)
Lab Results  Component Value Date   HGBA1C 6.1 12/02/2022   Stable, pt to continue current medical treatment  - diet, wt control

## 2022-12-12 NOTE — Assessment & Plan Note (Signed)

## 2022-12-15 ENCOUNTER — Encounter: Payer: Self-pay | Admitting: Internal Medicine

## 2022-12-15 ENCOUNTER — Other Ambulatory Visit (HOSPITAL_COMMUNITY): Payer: Self-pay

## 2022-12-15 DIAGNOSIS — R059 Cough, unspecified: Secondary | ICD-10-CM

## 2022-12-15 MED ORDER — PANTOPRAZOLE SODIUM 40 MG PO TBEC
40.0000 mg | DELAYED_RELEASE_TABLET | Freq: Every day | ORAL | 3 refills | Status: DC
  Filled 2022-12-15: qty 90, 90d supply, fill #0
  Filled 2023-03-14: qty 90, 90d supply, fill #1
  Filled 2023-06-17: qty 90, 90d supply, fill #2
  Filled 2023-09-11: qty 90, 90d supply, fill #3

## 2022-12-21 ENCOUNTER — Encounter: Payer: Self-pay | Admitting: Internal Medicine

## 2022-12-21 DIAGNOSIS — R059 Cough, unspecified: Secondary | ICD-10-CM

## 2022-12-24 ENCOUNTER — Ambulatory Visit (INDEPENDENT_AMBULATORY_CARE_PROVIDER_SITE_OTHER): Payer: Commercial Managed Care - PPO

## 2022-12-24 DIAGNOSIS — R059 Cough, unspecified: Secondary | ICD-10-CM | POA: Diagnosis not present

## 2022-12-24 DIAGNOSIS — R053 Chronic cough: Secondary | ICD-10-CM | POA: Diagnosis not present

## 2022-12-30 ENCOUNTER — Other Ambulatory Visit (HOSPITAL_COMMUNITY): Payer: Self-pay

## 2022-12-30 ENCOUNTER — Ambulatory Visit (INDEPENDENT_AMBULATORY_CARE_PROVIDER_SITE_OTHER)
Admission: RE | Admit: 2022-12-30 | Discharge: 2022-12-30 | Disposition: A | Discharge status: Home / Self Care | Payer: Commercial Managed Care - PPO | Source: Ambulatory Visit | Attending: Internal Medicine | Admitting: Internal Medicine

## 2022-12-30 ENCOUNTER — Ambulatory Visit (HOSPITAL_BASED_OUTPATIENT_CLINIC_OR_DEPARTMENT_OTHER)
Admission: RE | Admit: 2022-12-30 | Discharge: 2022-12-30 | Disposition: A | Discharge status: Home / Self Care | Payer: Commercial Managed Care - PPO | Source: Ambulatory Visit | Attending: Internal Medicine | Admitting: Internal Medicine

## 2022-12-30 DIAGNOSIS — E7800 Pure hypercholesterolemia, unspecified: Secondary | ICD-10-CM | POA: Insufficient documentation

## 2022-12-30 DIAGNOSIS — R7303 Prediabetes: Secondary | ICD-10-CM | POA: Diagnosis not present

## 2022-12-30 DIAGNOSIS — R9431 Abnormal electrocardiogram [ECG] [EKG]: Secondary | ICD-10-CM | POA: Insufficient documentation

## 2022-12-30 DIAGNOSIS — K5730 Diverticulosis of large intestine without perforation or abscess without bleeding: Secondary | ICD-10-CM | POA: Diagnosis not present

## 2022-12-30 DIAGNOSIS — R3129 Other microscopic hematuria: Secondary | ICD-10-CM | POA: Insufficient documentation

## 2022-12-30 DIAGNOSIS — K7689 Other specified diseases of liver: Secondary | ICD-10-CM | POA: Diagnosis not present

## 2022-12-30 MED ORDER — IOHEXOL 300 MG/ML  SOLN
100.0000 mL | Freq: Once | INTRAMUSCULAR | Status: AC | PRN
  Administered 2022-12-30: 85 mL via INTRAVENOUS

## 2023-01-05 ENCOUNTER — Other Ambulatory Visit (HOSPITAL_COMMUNITY): Payer: Self-pay

## 2023-01-09 ENCOUNTER — Encounter: Payer: Self-pay | Admitting: Internal Medicine

## 2023-01-09 ENCOUNTER — Other Ambulatory Visit: Payer: Self-pay | Admitting: Internal Medicine

## 2023-01-09 DIAGNOSIS — I2510 Atherosclerotic heart disease of native coronary artery without angina pectoris: Secondary | ICD-10-CM | POA: Insufficient documentation

## 2023-01-09 MED ORDER — ASPIRIN 81 MG PO TBEC: 81.0000 mg | DELAYED_RELEASE_TABLET | Freq: Every day | ORAL | 11 refills | Status: DC

## 2023-01-13 ENCOUNTER — Encounter: Payer: Self-pay | Admitting: Internal Medicine

## 2023-01-16 ENCOUNTER — Other Ambulatory Visit (HOSPITAL_COMMUNITY): Payer: Self-pay

## 2023-01-18 ENCOUNTER — Other Ambulatory Visit: Payer: Self-pay

## 2023-01-26 ENCOUNTER — Ambulatory Visit: Payer: Commercial Managed Care - PPO | Admitting: Orthopaedic Surgery

## 2023-01-26 ENCOUNTER — Other Ambulatory Visit (INDEPENDENT_AMBULATORY_CARE_PROVIDER_SITE_OTHER): Payer: Commercial Managed Care - PPO

## 2023-01-26 DIAGNOSIS — M25511 Pain in right shoulder: Secondary | ICD-10-CM

## 2023-01-26 DIAGNOSIS — M25521 Pain in right elbow: Secondary | ICD-10-CM | POA: Insufficient documentation

## 2023-01-26 NOTE — Progress Notes (Signed)
Office Visit Note   Patient: Alisha Reed           Date of Birth: 09/16/1960           MRN: [id number] Visit Date: 01/26/2023              Requested by: Corwin Levins, MD [address] [place],  [place] [address] PCP: Corwin Levins, MD   Assessment & Plan: Visit Diagnoses:  1. Acute pain of right shoulder   2. Pain in right elbow     Plan: Alisha Reed is a 62 year old female with right shoulder and right elbow pain.  Impression of the shoulder pain is trapezius strain and overuse.  Recommend soft tissue mobilization and over-the-counter muscle rubs and activity modification.  I think this could be related to compensation for lateral epicondylitis.  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  Follow-Up Instructions: No follow-ups on file.   Orders:  Orders Placed This Encounter  Procedures   XR Shoulder Right   No orders of the defined types were placed in this encounter.     Procedures: No procedures performed   Clinical Data: No additional findings.   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  It is worse when she is lifting the hands and positioning patients.  She feels like she has had to compensate with her shoulder because of the elbow pain.  Has had more pain when she lifts overhead.  Denies any numbness and tingling.  Feels pain in the right trapezius region as well as the lateral elbow. Review of Systems   Objective: Vital Signs: There were no vitals taken for this visit.  Physical Exam  Ortho Exam Examination of the right shoulder girdle is normal.  She has tenderness to the trapezius muscle on the right side. Exam of the right elbow shows slight tenderness to the lateral epicondyle. Specialty Comments:  No specialty comments available.  Imaging: No  results found.   PMFS History: Patient Active Problem List   Diagnosis Date Noted   Pain in right elbow 01/26/2023   Coronary artery calcification seen on CT scan 01/09/2023   Allergic rhinitis 12/09/2022   Microhematuria 12/09/2022   Rectal bleeding 03/13/2022   Other constipation 03/13/2022   Arthritis 12/03/2021   Stress incontinence 11/19/2020   S/p bilateral carpal tunnel release 03/28/2019   Chronic pain of right knee 12/29/2018   Right carpal tunnel syndrome 12/29/2018   Left carpal tunnel syndrome 12/29/2018   Osteopenia 11/08/2018   Pre-diabetes 05/02/2018   Acute pain of right shoulder 03/18/2018   Hyperlipidemia 11/04/2017   Vitamin D deficiency 11/04/2017   S/P cholecystectomy 08/02/2017   Skier's thumb, right, subsequent encounter 03/15/2017   Chronic left shoulder pain 02/08/2017   Nondisplaced fracture of greater tuberosity of left humerus, initial encounter for closed fracture 01/18/2017   Encounter for well adult exam with abnormal findings 09/24/2016   Hypothyroidism 09/24/2016   Past Medical History:  Diagnosis Date   Arthritis    hands,    Carpal tunnel syndrome on both sides    both hands surgeries 01/2019   GERD (gastroesophageal reflux disease)    Hypercholesterolemia    Hypothyroidism    Osteopenia 11/08/2018   Thyroid disease     Family History  Problem  Relation Age of Onset   Scleroderma Mother    Rheum arthritis Mother    Heart disease Mother    Heart disease Father    Glaucoma Father    Stomach cancer Maternal Grandmother    Heart attack Maternal Grandfather    Alzheimer's disease Paternal Grandmother    Throat cancer Paternal Grandfather    Colon cancer Neg Hx    Liver cancer Neg Hx    Colon polyps Neg Hx    Esophageal cancer Neg Hx    Rectal cancer Neg Hx     Past Surgical History:  Procedure Laterality Date   BILATERAL CARPAL TUNNEL RELEASE Bilateral 01/18/2019   Procedure: BILATERAL CARPAL TUNNEL RELEASE;  Surgeon: Tarry Kos, MD;  Location: [facility];  Service: Orthopedics;  Laterality: Bilateral;   BLADDER SUSPENSION N/A 08/10/2022   Procedure: TRANSVAGINAL TAPE (TVT) PROCEDURE;  Surgeon: Marguerita Beards, MD;  Location: [facility];  Service: Gynecology;  Laterality: N/A;  Total time requested is 1 hour.   CHOLECYSTECTOMY     COLONOSCOPY  2011   CYSTOSCOPY N/A 08/10/2022   Procedure: CYSTOSCOPY;  Surgeon: Marguerita Beards, MD;  Location: [facility];  Service: Gynecology;  Laterality: N/A;   FOOT SURGERY Bilateral    HYSTERECTOMY ABDOMINAL WITH SALPINGECTOMY     NASAL SINUS SURGERY     Social History   Occupational History   Occupation: [employer] fundraiser  Tobacco Use   Smoking status: Never   Smokeless tobacco: Never  Vaping Use   Vaping status: Never Used  Substance and Sexual Activity   Alcohol use: Yes    Comment: social   Drug use: No   Sexual activity: Yes    Birth control/protection: Surgical

## 2023-02-14 ENCOUNTER — Other Ambulatory Visit: Payer: Self-pay | Admitting: Physician Assistant

## 2023-02-15 ENCOUNTER — Other Ambulatory Visit (HOSPITAL_COMMUNITY): Payer: Self-pay

## 2023-02-23 ENCOUNTER — Ambulatory Visit: Payer: Commercial Managed Care - PPO | Admitting: Orthopaedic Surgery

## 2023-02-23 ENCOUNTER — Encounter: Payer: Self-pay | Admitting: Orthopaedic Surgery

## 2023-02-23 DIAGNOSIS — M25561 Pain in right knee: Secondary | ICD-10-CM | POA: Diagnosis not present

## 2023-02-23 MED ORDER — METHYLPREDNISOLONE ACETATE 40 MG/ML IJ SUSP
40.0000 mg | INTRAMUSCULAR | Status: AC | PRN
Start: 2023-02-23 — End: 2023-02-23
  Administered 2023-02-23: 40 mg via INTRA_ARTICULAR

## 2023-02-23 MED ORDER — LIDOCAINE HCL 1 % IJ SOLN
2.0000 mL | INTRAMUSCULAR | Status: AC | PRN
Start: 2023-02-23 — End: 2023-02-23
  Administered 2023-02-23: 2 mL

## 2023-02-23 MED ORDER — BUPIVACAINE HCL 0.5 % IJ SOLN
2.0000 mL | INTRAMUSCULAR | Status: AC | PRN
Start: 2023-02-23 — End: 2023-02-23
  Administered 2023-02-23: 2 mL via INTRA_ARTICULAR

## 2023-02-23 NOTE — Progress Notes (Signed)
Office Visit Note   Patient: Alisha Reed           Date of Birth: 1960/09/15           MRN: [id number] Visit Date: 02/23/2023              Requested by: Corwin Levins, MD [address] [place],  [place] [address] PCP: Corwin Levins, MD   Assessment & Plan: Visit Diagnoses:  1. Acute pain of right knee     Plan: [name] is a 62 year old female with right knee pain since injury from 3 weeks ago.  Not reporting any swelling or mechanical symptoms.  Mainly hurts medially at nighttime.  Could be a medial meniscus tear or aggravation of the cartilage defect that is known from a prior MRI in 2021.  Based on options we agreed to try cortisone injection and 6 weeks of relative rest and activity modification as needed.  Could consider knee brace during work.  If no improvement will need MRI.  Follow-Up Instructions: No follow-ups on file.   Orders:  Orders Placed This Encounter  Procedures   Large Joint Inj   No orders of the defined types were placed in this encounter.     Procedures: Large Joint Inj: R knee on 02/23/2023 11:25 AM Indications: pain Details: 22 G needle  Arthrogram: No  Medications: 40 mg methylPREDNISolone acetate 40 MG/ML; 2 mL lidocaine 1 %; 2 mL bupivacaine 0.5 % Consent was given by the patient. Patient was prepped and draped in the usual sterile fashion.       Clinical Data: No additional findings.   Subjective: Chief Complaint  Patient presents with   Right Knee - Pain    HPI [name] present today for evaluation of right knee pain for 3 weeks.  She was walking when she felt a pop on the medial side of her knee.  She has had stabbing nerve pain that is worse at night.  Reporting no mechanical symptoms.  Review of Systems   Objective: Vital Signs: There were no vitals taken for this visit.  Physical Exam  Ortho Exam Exam of the right knee shows no joint effusion.  Slight medial joint line tenderness.  Negative McMurray.  Collaterals and  cruciates are stable.  Normal range of motion. Specialty Comments:  No specialty comments available.  Imaging: No results found.   PMFS History: Patient Active Problem List   Diagnosis Date Noted   Pain in right elbow 01/26/2023   Coronary artery calcification seen on CT scan 01/09/2023   Allergic rhinitis 12/09/2022   Microhematuria 12/09/2022   Rectal bleeding 03/13/2022   Other constipation 03/13/2022   Arthritis 12/03/2021   Stress incontinence 11/19/2020   S/p bilateral carpal tunnel release 03/28/2019   Chronic pain of right knee 12/29/2018   Right carpal tunnel syndrome 12/29/2018   Left carpal tunnel syndrome 12/29/2018   Osteopenia 11/08/2018   Pre-diabetes 05/02/2018   Acute pain of right shoulder 03/18/2018   Hyperlipidemia 11/04/2017   Vitamin D deficiency 11/04/2017   S/P cholecystectomy 08/02/2017   Skier's thumb, right, subsequent encounter 03/15/2017   Chronic left shoulder pain 02/08/2017   Nondisplaced fracture of greater tuberosity of left humerus, initial encounter for closed fracture 01/18/2017   Encounter for well adult exam with abnormal findings 09/24/2016   Hypothyroidism 09/24/2016   Past Medical History:  Diagnosis Date   Arthritis    hands,    Carpal tunnel syndrome on both sides    both  hands surgeries 01/2019   GERD (gastroesophageal reflux disease)    Hypercholesterolemia    Hypothyroidism    Osteopenia 11/08/2018   Thyroid disease     Family History  Problem Relation Age of Onset   Scleroderma Mother    Rheum arthritis Mother    Heart disease Mother    Heart disease Father    Glaucoma Father    Stomach cancer Maternal Grandmother    Heart attack Maternal Grandfather    Alzheimer's disease Paternal Grandmother    Throat cancer Paternal Grandfather    Colon cancer Neg Hx    Liver cancer Neg Hx    Colon polyps Neg Hx    Esophageal cancer Neg Hx    Rectal cancer Neg Hx     Past Surgical History:  Procedure Laterality Date    BILATERAL CARPAL TUNNEL RELEASE Bilateral 01/18/2019   Procedure: BILATERAL CARPAL TUNNEL RELEASE;  Surgeon: Tarry Kos, MD;  Location: [facility];  Service: Orthopedics;  Laterality: Bilateral;   BLADDER SUSPENSION N/A 08/10/2022   Procedure: TRANSVAGINAL TAPE (TVT) PROCEDURE;  Surgeon: Marguerita Beards, MD;  Location: [facility];  Service: Gynecology;  Laterality: N/A;  Total time requested is 1 hour.   CHOLECYSTECTOMY     COLONOSCOPY  2011   CYSTOSCOPY N/A 08/10/2022   Procedure: CYSTOSCOPY;  Surgeon: Marguerita Beards, MD;  Location: [facility];  Service: Gynecology;  Laterality: N/A;   FOOT SURGERY Bilateral    HYSTERECTOMY ABDOMINAL WITH SALPINGECTOMY     NASAL SINUS SURGERY     Social History   Occupational History   Occupation: [employer] fundraiser  Tobacco Use   Smoking status: Never   Smokeless tobacco: Never  Vaping Use   Vaping status: Never Used  Substance and Sexual Activity   Alcohol use: Yes    Comment: social   Drug use: No   Sexual activity: Yes    Birth control/protection: Surgical

## 2023-02-26 ENCOUNTER — Other Ambulatory Visit (HOSPITAL_COMMUNITY): Payer: Self-pay

## 2023-02-28 ENCOUNTER — Encounter: Payer: Self-pay | Admitting: Orthopaedic Surgery

## 2023-03-01 ENCOUNTER — Other Ambulatory Visit: Payer: Self-pay | Admitting: Physician Assistant

## 2023-03-01 ENCOUNTER — Other Ambulatory Visit (HOSPITAL_COMMUNITY): Payer: Self-pay

## 2023-03-01 MED ORDER — DICLOFENAC SODIUM 75 MG PO TBEC
75.0000 mg | DELAYED_RELEASE_TABLET | Freq: Two times a day (BID) | ORAL | 3 refills | Status: DC | PRN
  Filled 2023-03-01: qty 60, 30d supply, fill #0
  Filled 2023-03-28: qty 60, 30d supply, fill #1
  Filled 2023-05-03: qty 60, 30d supply, fill #2
  Filled 2023-06-17: qty 60, 30d supply, fill #3

## 2023-03-01 NOTE — Telephone Encounter (Signed)
Sent in voltaren.  Not sure why we didn't get message from pharmacy

## 2023-03-11 DIAGNOSIS — D2371 Other benign neoplasm of skin of right lower limb, including hip: Secondary | ICD-10-CM | POA: Diagnosis not present

## 2023-03-11 DIAGNOSIS — L578 Other skin changes due to chronic exposure to nonionizing radiation: Secondary | ICD-10-CM | POA: Diagnosis not present

## 2023-03-11 DIAGNOSIS — D2372 Other benign neoplasm of skin of left lower limb, including hip: Secondary | ICD-10-CM | POA: Diagnosis not present

## 2023-03-11 DIAGNOSIS — Z85828 Personal history of other malignant neoplasm of skin: Secondary | ICD-10-CM | POA: Diagnosis not present

## 2023-03-11 DIAGNOSIS — D225 Melanocytic nevi of trunk: Secondary | ICD-10-CM | POA: Diagnosis not present

## 2023-03-11 DIAGNOSIS — L821 Other seborrheic keratosis: Secondary | ICD-10-CM | POA: Diagnosis not present

## 2023-03-12 ENCOUNTER — Other Ambulatory Visit (HOSPITAL_COMMUNITY): Payer: Self-pay

## 2023-03-23 ENCOUNTER — Other Ambulatory Visit: Payer: Self-pay | Admitting: Physician Assistant

## 2023-03-23 ENCOUNTER — Other Ambulatory Visit (HOSPITAL_COMMUNITY): Payer: Self-pay

## 2023-03-23 MED ORDER — PREDNISONE 10 MG (21) PO TBPK
ORAL_TABLET | ORAL | 0 refills | Status: DC
  Filled 2023-03-23: qty 21, 6d supply, fill #0

## 2023-03-23 MED ORDER — METHOCARBAMOL 750 MG PO TABS
750.0000 mg | ORAL_TABLET | Freq: Two times a day (BID) | ORAL | 1 refills | Status: DC | PRN
  Filled 2023-03-23: qty 20, 10d supply, fill #0
  Filled 2023-04-21: qty 20, 10d supply, fill #1

## 2023-04-21 ENCOUNTER — Other Ambulatory Visit (HOSPITAL_COMMUNITY): Payer: Self-pay

## 2023-04-22 ENCOUNTER — Other Ambulatory Visit: Payer: Self-pay

## 2023-04-26 ENCOUNTER — Other Ambulatory Visit (HOSPITAL_COMMUNITY): Payer: Self-pay

## 2023-04-26 MED ORDER — ESTRADIOL 1 MG PO TABS
1.0000 mg | ORAL_TABLET | Freq: Every day | ORAL | 0 refills | Status: DC
  Filled 2023-04-26: qty 90, 90d supply, fill #0

## 2023-04-28 ENCOUNTER — Encounter: Payer: Self-pay | Admitting: Orthopaedic Surgery

## 2023-05-06 ENCOUNTER — Telehealth: Payer: Commercial Managed Care - PPO | Admitting: Physician Assistant

## 2023-05-06 ENCOUNTER — Encounter: Payer: Self-pay | Admitting: Internal Medicine

## 2023-05-06 DIAGNOSIS — J019 Acute sinusitis, unspecified: Secondary | ICD-10-CM | POA: Diagnosis not present

## 2023-05-06 DIAGNOSIS — B9689 Other specified bacterial agents as the cause of diseases classified elsewhere: Secondary | ICD-10-CM | POA: Diagnosis not present

## 2023-05-06 MED ORDER — AMOXICILLIN-POT CLAVULANATE 875-125 MG PO TABS
1.0000 | ORAL_TABLET | Freq: Two times a day (BID) | ORAL | 0 refills | Status: DC
Start: 2023-05-06 — End: 2023-09-16

## 2023-05-06 NOTE — Progress Notes (Signed)

## 2023-05-07 ENCOUNTER — Other Ambulatory Visit (HOSPITAL_COMMUNITY): Payer: Self-pay

## 2023-05-07 MED ORDER — REPATHA SURECLICK 140 MG/ML ~~LOC~~ SOAJ
140.0000 mg | SUBCUTANEOUS | 3 refills | Status: AC
Start: 2023-05-07 — End: ?
  Filled 2023-05-07: qty 6, 84d supply, fill #0
  Filled 2023-09-21: qty 6, 84d supply, fill #1
  Filled 2024-01-07: qty 6, 84d supply, fill #2

## 2023-05-11 ENCOUNTER — Other Ambulatory Visit (HOSPITAL_COMMUNITY): Payer: Self-pay

## 2023-05-20 ENCOUNTER — Ambulatory Visit: Payer: Commercial Managed Care - PPO | Admitting: Orthopaedic Surgery

## 2023-05-20 ENCOUNTER — Encounter: Payer: Self-pay | Admitting: Orthopaedic Surgery

## 2023-05-20 ENCOUNTER — Other Ambulatory Visit (INDEPENDENT_AMBULATORY_CARE_PROVIDER_SITE_OTHER): Payer: Self-pay

## 2023-05-20 DIAGNOSIS — M25561 Pain in right knee: Secondary | ICD-10-CM

## 2023-05-20 DIAGNOSIS — M25511 Pain in right shoulder: Secondary | ICD-10-CM

## 2023-05-20 NOTE — Progress Notes (Signed)
 Office Visit Note   Patient: Alisha Reed           Date of Birth: [birth year]           MRN: [id number] Visit Date: 05/20/2023              Requested by: Norleen Lynwood ORN, MD [address] [place],  [place] [address] PCP: Norleen Lynwood ORN, MD   Assessment & Plan: Visit Diagnoses:  1. Acute pain of right knee   2. Acute pain of right shoulder     Plan: Alisha Reed is a 63 year old female with right knee and right shoulder contusion status post mechanical fall.  X-rays are unremarkable.  Recommend RICE.  Work note provided for today.  Symptomatic treatment recommended.  Follow-up as needed.  Follow-Up Instructions: No follow-ups on file.   Orders:  Orders Placed This Encounter  Procedures   XR KNEE 3 VIEW RIGHT   XR Shoulder Right   No orders of the defined types were placed in this encounter.     Procedures: No procedures performed   Clinical Data: No additional findings.   Subjective: Chief Complaint  Patient presents with   Right Knee - Pain    HPI Alisha Reed comes in today for evaluation of right knee pain status post ground-level fall onto the right knee 2 days ago.  Landed directly on the patella.  Reports she has trouble sleeping.  Also has right shoulder pain from the fall. Review of Systems  Constitutional: Negative.   HENT: Negative.    Eyes: Negative.   Respiratory: Negative.    Cardiovascular: Negative.   Endocrine: Negative.   Musculoskeletal: Negative.   Neurological: Negative.   Hematological: Negative.   Psychiatric/Behavioral: Negative.    All other systems reviewed and are negative.    Objective: Vital Signs: There were no vitals taken for this visit.  Physical Exam Vitals and nursing note reviewed.  Constitutional:      Appearance: She is well-developed.  HENT:     Head: Normocephalic and atraumatic.  Pulmonary:     Effort: Pulmonary effort is normal.  Abdominal:     Palpations: Abdomen is soft.  Musculoskeletal:     Cervical back: Neck  supple.  Skin:    General: Skin is warm.     Capillary Refill: Capillary refill takes less than 2 seconds.  Neurological:     Mental Status: She is alert and oriented to person, place, and time.  Psychiatric:        Behavior: Behavior normal.        Thought Content: Thought content normal.        Judgment: Judgment normal.     Ortho Exam Exam of the right knee shows mild bruising.  She has tenderness to the anterior aspect of the patella.  Minimal fluctuance to the prepatellar bursa.  Function of the knee is intact.  Exam of the right shoulder shows normal function and good strength to manual muscle testing the rotator cuff. Specialty Comments:  No specialty comments available.  Imaging: XR KNEE 3 VIEW RIGHT Result Date: 05/20/2023 X-rays of the right knee show no acute or structural abnormalities  XR Shoulder Right Result Date: 05/20/2023 X-rays of the right shoulder show no acute or structural abnormalities.    PMFS History: Patient Active Problem List   Diagnosis Date Noted   Pain in right elbow 01/26/2023   Coronary artery calcification seen on CT scan 01/09/2023   Allergic rhinitis 12/09/2022   Microhematuria 12/09/2022  Rectal bleeding 03/13/2022   Other constipation 03/13/2022   Arthritis 12/03/2021   Stress incontinence 11/19/2020   S/p bilateral carpal tunnel release 03/28/2019   Chronic pain of right knee 12/29/2018   Right carpal tunnel syndrome 12/29/2018   Left carpal tunnel syndrome 12/29/2018   Osteopenia 11/08/2018   Pre-diabetes 05/02/2018   Acute pain of right shoulder 03/18/2018   Hyperlipidemia 11/04/2017   Vitamin D  deficiency 11/04/2017   S/P cholecystectomy 08/02/2017   Skier's thumb, right, subsequent encounter 03/15/2017   Chronic left shoulder pain 02/08/2017   Nondisplaced fracture of greater tuberosity of left humerus, initial encounter for closed fracture 01/18/2017   Encounter for well adult exam with abnormal findings 09/24/2016    Hypothyroidism 09/24/2016   Past Medical History:  Diagnosis Date   Arthritis    hands,    Carpal tunnel syndrome on both sides    both hands surgeries 01/2019   GERD (gastroesophageal reflux disease)    Hypercholesterolemia    Hypothyroidism    Osteopenia 11/08/2018   Thyroid  disease     Family History  Problem Relation Age of Onset   Scleroderma Mother    Rheum arthritis Mother    Heart disease Mother    Heart disease Father    Glaucoma Father    Stomach cancer Maternal Grandmother    Heart attack Maternal Grandfather    Alzheimer's disease Paternal Grandmother    Throat cancer Paternal Grandfather    Colon cancer Neg Hx    Liver cancer Neg Hx    Colon polyps Neg Hx    Esophageal cancer Neg Hx    Rectal cancer Neg Hx     Past Surgical History:  Procedure Laterality Date   BILATERAL CARPAL TUNNEL RELEASE Bilateral 01/18/2019   Procedure: BILATERAL CARPAL TUNNEL RELEASE;  Surgeon: Jerri Kay HERO, MD;  Location: [facility];  Service: Orthopedics;  Laterality: Bilateral;   BLADDER SUSPENSION N/A 08/10/2022   Procedure: TRANSVAGINAL TAPE (TVT) PROCEDURE;  Surgeon: Marilynne Rosaline SAILOR, MD;  Location: [facility];  Service: Gynecology;  Laterality: N/A;  Total time requested is 1 hour.   CHOLECYSTECTOMY     COLONOSCOPY  2011   CYSTOSCOPY N/A 08/10/2022   Procedure: CYSTOSCOPY;  Surgeon: Marilynne Rosaline SAILOR, MD;  Location: [facility];  Service: Gynecology;  Laterality: N/A;   FOOT SURGERY Bilateral    HYSTERECTOMY ABDOMINAL WITH SALPINGECTOMY     NASAL SINUS SURGERY     Social History   Occupational History   Occupation: [employer]  Tobacco Use   Smoking status: Never   Smokeless tobacco: Never  Vaping Use   Vaping status: Never Used  Substance and Sexual Activity   Alcohol use: Yes    Comment: social   Drug use: No   Sexual activity: Yes    Birth control/protection: Surgical

## 2023-05-26 ENCOUNTER — Ambulatory Visit: Payer: Commercial Managed Care - PPO | Admitting: Orthopaedic Surgery

## 2023-05-26 NOTE — Telephone Encounter (Signed)
 Please order MRI right knee.  Thanks.

## 2023-05-27 ENCOUNTER — Other Ambulatory Visit: Payer: Self-pay

## 2023-05-27 DIAGNOSIS — M25561 Pain in right knee: Secondary | ICD-10-CM

## 2023-06-12 ENCOUNTER — Ambulatory Visit
Admission: RE | Admit: 2023-06-12 | Discharge: 2023-06-12 | Disposition: A | Discharge status: Home / Self Care | Payer: Commercial Managed Care - PPO | Source: Ambulatory Visit | Attending: Orthopaedic Surgery | Admitting: Orthopaedic Surgery

## 2023-06-12 DIAGNOSIS — M25561 Pain in right knee: Secondary | ICD-10-CM | POA: Diagnosis not present

## 2023-06-24 ENCOUNTER — Encounter: Payer: Self-pay | Admitting: Internal Medicine

## 2023-06-29 ENCOUNTER — Ambulatory Visit: Payer: Commercial Managed Care - PPO | Admitting: Orthopaedic Surgery

## 2023-06-29 DIAGNOSIS — M1711 Unilateral primary osteoarthritis, right knee: Secondary | ICD-10-CM | POA: Insufficient documentation

## 2023-06-29 DIAGNOSIS — M7711 Lateral epicondylitis, right elbow: Secondary | ICD-10-CM | POA: Insufficient documentation

## 2023-06-29 MED ORDER — METHYLPREDNISOLONE ACETATE 40 MG/ML IJ SUSP
40.0000 mg | INTRAMUSCULAR | Status: AC | PRN
  Administered 2023-06-29: 40 mg via INTRA_ARTICULAR

## 2023-06-29 MED ORDER — LIDOCAINE HCL 1 % IJ SOLN
1.0000 mL | INTRAMUSCULAR | Status: AC | PRN
  Administered 2023-06-29: 1 mL

## 2023-06-29 MED ORDER — BUPIVACAINE HCL 0.5 % IJ SOLN
1.0000 mL | INTRAMUSCULAR | Status: AC | PRN
  Administered 2023-06-29: 1 mL via INTRA_ARTICULAR

## 2023-06-29 NOTE — Progress Notes (Signed)
Office Visit Note   Patient: Alisha Reed           Date of Birth: 07-02-1960           MRN: [id number] Visit Date: 06/29/2023              Requested by: Corwin Levins, MD [address] [place],  [place] [address] PCP: Corwin Levins, MD   Assessment & Plan: Visit Diagnoses:  1. Primary osteoarthritis of right knee   2. 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  Treatment options were discussed for each condition.  We did a cortisone injection for the lateral epicondylitis.  I will place her on lifting restrictions at work for 6 weeks.  Home exercise provided.  For the knee treatment options discussed.  She will try to continue to manage this until retirement next year.  Gel and cortisone injections and unloader braces have not been that effective.  Follow-Up Instructions: No follow-ups on file.   Orders:  Orders Placed This Encounter  Procedures   Medium Joint Inj: R lateral epicondyle   No orders of the defined types were placed in this encounter.     Procedures: Medium Joint Inj: R lateral epicondyle on 06/29/2023 8:10 AM Indications: pain Details: 25 G needle Medications: 1 mL lidocaine 1 %; 1 mL bupivacaine 0.5 %; 40 mg methylPREDNISolone acetate 40 MG/ML Outcome: tolerated well, no immediate complications Patient was prepped and draped in the usual sterile fashion.       Clinical Data: No additional findings.   Subjective: Chief Complaint  Patient presents with   Right Elbow - Pain    HPI Alisha Reed comes in today for follow-up evaluation of right elbow pain.  This has been going on for about 6 months.  Denies any radicular symptoms or numbness and tingling.  She has noticed decreased grip strength with the right arm.  She has used a counterforce brace which does not help.  The pain wakes her up at night. Review of Systems   Constitutional: Negative.   HENT: Negative.    Eyes: Negative.   Respiratory: Negative.    Cardiovascular: Negative.   Endocrine: Negative.   Musculoskeletal: Negative.   Neurological: Negative.   Hematological: Negative.   Psychiatric/Behavioral: Negative.    All other systems reviewed and are negative.    Objective: Vital Signs: There were no vitals taken for this visit.  Physical Exam Vitals and nursing note reviewed.  Constitutional:      Appearance: She is well-developed.  HENT:     Head: Normocephalic and atraumatic.  Pulmonary:     Effort: Pulmonary effort is normal.  Abdominal:     Palpations: Abdomen is soft.  Musculoskeletal:     Cervical back: Neck supple.  Skin:    General: Skin is warm.     Capillary Refill: Capillary refill takes less than 2 seconds.  Neurological:     Mental Status: She is alert and oriented to person, place, and time.  Psychiatric:        Behavior: Behavior normal.        Thought Content: Thought content normal.        Judgment: Judgment normal.     Ortho Exam Examination of the right elbow shows tenderness to the lateral epicondyle and pain with resisted wrist extension and long finger extension.  Radial tunnel is nontender.  Examination of the right knee is unchanged from prior visit. Specialty Comments:  No specialty comments available.  Imaging: No results found.   PMFS History: Patient Active Problem List   Diagnosis Date Noted   Primary osteoarthritis of right knee 06/29/2023   Right tennis elbow 06/29/2023   Pain in right elbow 01/26/2023   Coronary artery calcification seen on CT scan 01/09/2023   Allergic rhinitis 12/09/2022   Microhematuria 12/09/2022   Rectal bleeding 03/13/2022   Other constipation 03/13/2022   Arthritis 12/03/2021   Stress incontinence 11/19/2020   S/p bilateral carpal tunnel release 03/28/2019   Chronic pain of right knee 12/29/2018   Right carpal tunnel syndrome 12/29/2018   Left  carpal tunnel syndrome 12/29/2018   Osteopenia 11/08/2018   Pre-diabetes 05/02/2018   Acute pain of right shoulder 03/18/2018   Hyperlipidemia 11/04/2017   Vitamin D deficiency 11/04/2017   S/P cholecystectomy 08/02/2017   Skier's thumb, right, subsequent encounter 03/15/2017   Chronic left shoulder pain 02/08/2017   Nondisplaced fracture of greater tuberosity of left humerus, initial encounter for closed fracture 01/18/2017   Encounter for well adult exam with abnormal findings 09/24/2016   Hypothyroidism 09/24/2016   Past Medical History:  Diagnosis Date   Arthritis    hands,    Carpal tunnel syndrome on both sides    both hands surgeries 01/2019   GERD (gastroesophageal reflux disease)    Hypercholesterolemia    Hypothyroidism    Osteopenia 11/08/2018   Thyroid disease     Family History  Problem Relation Age of Onset   Scleroderma Mother    Rheum arthritis Mother    Heart disease Mother    Heart disease Father    Glaucoma Father    Stomach cancer Maternal Grandmother    Heart attack Maternal Grandfather    Alzheimer's disease Paternal Grandmother    Throat cancer Paternal Grandfather    Colon cancer Neg Hx    Liver cancer Neg Hx    Colon polyps Neg Hx    Esophageal cancer Neg Hx    Rectal cancer Neg Hx     Past Surgical History:  Procedure Laterality Date   BILATERAL CARPAL TUNNEL RELEASE Bilateral 01/18/2019   Procedure: BILATERAL CARPAL TUNNEL RELEASE;  Surgeon: Tarry Kos, MD;  Location: [facility];  Service: Orthopedics;  Laterality: Bilateral;   BLADDER SUSPENSION N/A 08/10/2022   Procedure: TRANSVAGINAL TAPE (TVT) PROCEDURE;  Surgeon: Marguerita Beards, MD;  Location: [facility];  Service: Gynecology;  Laterality: N/A;  Total time requested is 1 hour.   CHOLECYSTECTOMY     COLONOSCOPY  2011   CYSTOSCOPY N/A 08/10/2022   Procedure: CYSTOSCOPY;  Surgeon: Marguerita Beards, MD;  Location: [facility];   Service: Gynecology;  Laterality: N/A;   FOOT SURGERY Bilateral    HYSTERECTOMY ABDOMINAL WITH SALPINGECTOMY     NASAL SINUS SURGERY     Social History   Occupational History   Occupation: [employer]  Tobacco Use   Smoking status: Never   Smokeless tobacco: Never  Vaping Use   Vaping status: Never Used  Substance and Sexual Activity   Alcohol use: Yes    Comment: social   Drug use: No   Sexual activity: Yes    Birth control/protection: Surgical

## 2023-06-30 ENCOUNTER — Other Ambulatory Visit (HOSPITAL_COMMUNITY): Payer: Self-pay

## 2023-06-30 DIAGNOSIS — Z1231 Encounter for screening mammogram for malignant neoplasm of breast: Secondary | ICD-10-CM | POA: Diagnosis not present

## 2023-06-30 DIAGNOSIS — Z719 Counseling, unspecified: Secondary | ICD-10-CM | POA: Diagnosis not present

## 2023-06-30 DIAGNOSIS — Z2821 Immunization not carried out because of patient refusal: Secondary | ICD-10-CM | POA: Diagnosis not present

## 2023-06-30 DIAGNOSIS — Z01419 Encounter for gynecological examination (general) (routine) without abnormal findings: Secondary | ICD-10-CM | POA: Diagnosis not present

## 2023-06-30 DIAGNOSIS — N951 Menopausal and female climacteric states: Secondary | ICD-10-CM | POA: Diagnosis not present

## 2023-06-30 MED ORDER — ESTRADIOL 1 MG PO TABS
1.5000 mg | ORAL_TABLET | Freq: Every day | ORAL | 4 refills | Status: AC
  Filled 2023-07-03 – 2023-07-17 (×2): qty 135, 90d supply, fill #0
  Filled 2023-11-04: qty 135, 90d supply, fill #1
  Filled 2024-02-04: qty 135, 90d supply, fill #2

## 2023-07-03 ENCOUNTER — Other Ambulatory Visit (HOSPITAL_COMMUNITY): Payer: Self-pay

## 2023-07-17 ENCOUNTER — Other Ambulatory Visit: Payer: Self-pay | Admitting: Physician Assistant

## 2023-07-17 ENCOUNTER — Other Ambulatory Visit: Payer: Self-pay

## 2023-07-19 ENCOUNTER — Other Ambulatory Visit (HOSPITAL_COMMUNITY): Payer: Self-pay

## 2023-07-19 ENCOUNTER — Other Ambulatory Visit: Payer: Self-pay

## 2023-07-19 MED ORDER — DICLOFENAC SODIUM 75 MG PO TBEC
75.0000 mg | DELAYED_RELEASE_TABLET | Freq: Two times a day (BID) | ORAL | 3 refills | Status: DC | PRN
  Filled 2023-07-19: qty 60, 30d supply, fill #0
  Filled 2023-08-16: qty 60, 30d supply, fill #1
  Filled 2023-09-11: qty 60, 30d supply, fill #2

## 2023-08-05 ENCOUNTER — Encounter: Payer: Self-pay | Admitting: Orthopaedic Surgery

## 2023-08-11 DIAGNOSIS — H524 Presbyopia: Secondary | ICD-10-CM | POA: Diagnosis not present

## 2023-08-11 DIAGNOSIS — H43812 Vitreous degeneration, left eye: Secondary | ICD-10-CM | POA: Diagnosis not present

## 2023-08-11 DIAGNOSIS — D3121 Benign neoplasm of right retina: Secondary | ICD-10-CM | POA: Diagnosis not present

## 2023-08-11 DIAGNOSIS — H25813 Combined forms of age-related cataract, bilateral: Secondary | ICD-10-CM | POA: Diagnosis not present

## 2023-08-23 ENCOUNTER — Other Ambulatory Visit (HOSPITAL_COMMUNITY): Payer: Self-pay

## 2023-08-23 ENCOUNTER — Encounter: Payer: Self-pay | Admitting: Internal Medicine

## 2023-08-24 ENCOUNTER — Other Ambulatory Visit (HOSPITAL_COMMUNITY): Payer: Self-pay

## 2023-08-25 ENCOUNTER — Other Ambulatory Visit (HOSPITAL_COMMUNITY): Payer: Self-pay

## 2023-09-01 ENCOUNTER — Encounter: Payer: Self-pay | Admitting: Orthopaedic Surgery

## 2023-09-01 ENCOUNTER — Ambulatory Visit: Admitting: Orthopaedic Surgery

## 2023-09-01 DIAGNOSIS — M1711 Unilateral primary osteoarthritis, right knee: Secondary | ICD-10-CM

## 2023-09-01 NOTE — Progress Notes (Signed)
 Office Visit Note   Patient: Alisha Reed           Date of Birth: Dec 09, 1960           MRN: [id number] Visit Date: 09/01/2023              Requested by: Roslyn Coombe, MD [address] [place],  [place] [address] PCP: Roslyn Coombe, MD   Assessment & Plan: Visit Diagnoses:  1. Primary osteoarthritis of right knee     Plan: Impression is severe right knee medial compartment disease secondary to Osteoarthritis.  Grade 4 chondromalacia seen on MRI in the medial compartment with corresponding pain.  At this point, conservative treatments fail to provide any significant relief and the pain is severely affecting ADLs and quality of life.  Based on treatment options, the patient has elected to move forward with a partial knee replacement.  We have discussed the surgical risks that include but are not limited to infection, DVT, leg length discrepancy, stiffness, numbness, tingling, incomplete relief of pain.  Recovery and prognosis were also reviewed.    Anticoagulants: No antithrombotic Postop anticoagulation: Eliquis Diabetic: No  Nickel allergy: No Prior DVT/PE: No Tobacco use: No Clearances needed for surgery: None Anticipated discharge dispo: Outpatient surgery   Follow-Up Instructions: No follow-ups on file.   Orders:  No orders of the defined types were placed in this encounter.  No orders of the defined types were placed in this encounter.     Procedures: No procedures performed   Clinical Data: No additional findings.   Subjective: Chief Complaint  Patient presents with   Right Knee - Pain    HPI [name] returns today for follow up evaluation for chronic right knee pain.  Reports medial knee pain.  Does not improve with injections, po meds, activity modifications.  Can't sleep at night. Review of Systems   Objective: Vital Signs: There were no vitals taken for this visit.  Physical Exam  Ortho Exam Exam of right knee shows no effusion, medial joint  line tenderness.  Normal ROM. Specialty Comments:  No specialty comments available.  Imaging: No results found.   PMFS History: Patient Active Problem List   Diagnosis Date Noted   Primary osteoarthritis of right knee 06/29/2023   Right tennis elbow 06/29/2023   Pain in right elbow 01/26/2023   Coronary artery calcification seen on CT scan 01/09/2023   Allergic rhinitis 12/09/2022   Microhematuria 12/09/2022   Rectal bleeding 03/13/2022   Other constipation 03/13/2022   Arthritis 12/03/2021   Stress incontinence 11/19/2020   S/p bilateral carpal tunnel release 03/28/2019   Chronic pain of right knee 12/29/2018   Right carpal tunnel syndrome 12/29/2018   Left carpal tunnel syndrome 12/29/2018   Osteopenia 11/08/2018   Pre-diabetes 05/02/2018   Acute pain of right shoulder 03/18/2018   Hyperlipidemia 11/04/2017   Vitamin D  deficiency 11/04/2017   S/P cholecystectomy 08/02/2017   Skier's thumb, right, subsequent encounter 03/15/2017   Chronic left shoulder pain 02/08/2017   Nondisplaced fracture of greater tuberosity of left humerus, initial encounter for closed fracture 01/18/2017   Encounter for well adult exam with abnormal findings 09/24/2016   Hypothyroidism 09/24/2016   Past Medical History:  Diagnosis Date   Arthritis    hands,    Carpal tunnel syndrome on both sides    both hands surgeries 01/2019   GERD (gastroesophageal reflux disease)    Hypercholesterolemia    Hypothyroidism    Osteopenia 11/08/2018   Thyroid   disease     Family History  Problem Relation Age of Onset   Scleroderma Mother    Rheum arthritis Mother    Heart disease Mother    Heart disease Father    Glaucoma Father    Stomach cancer Maternal Grandmother    Heart attack Maternal Grandfather    Alzheimer's disease Paternal Grandmother    Throat cancer Paternal Grandfather    Colon cancer Neg Hx    Liver cancer Neg Hx    Colon polyps Neg Hx    Esophageal cancer Neg Hx    Rectal cancer  Neg Hx     Past Surgical History:  Procedure Laterality Date   BILATERAL CARPAL TUNNEL RELEASE Bilateral 01/18/2019   Procedure: BILATERAL CARPAL TUNNEL RELEASE;  Surgeon: Wes Hamman, MD;  Location: [facility];  Service: Orthopedics;  Laterality: Bilateral;   BLADDER SUSPENSION N/A 08/10/2022   Procedure: TRANSVAGINAL TAPE (TVT) PROCEDURE;  Surgeon: Arma Lamp, MD;  Location: [facility];  Service: Gynecology;  Laterality: N/A;  Total time requested is 1 hour.   CHOLECYSTECTOMY     COLONOSCOPY  2011   CYSTOSCOPY N/A 08/10/2022   Procedure: CYSTOSCOPY;  Surgeon: Arma Lamp, MD;  Location: [facility];  Service: Gynecology;  Laterality: N/A;   FOOT SURGERY Bilateral    HYSTERECTOMY ABDOMINAL WITH SALPINGECTOMY     NASAL SINUS SURGERY     Social History   Occupational History   Occupation: Charity fundraiser  Tobacco Use   Smoking status: Never   Smokeless tobacco: Never  Vaping Use   Vaping status: Never Used  Substance and Sexual Activity   Alcohol use: Yes    Comment: social   Drug use: No   Sexual activity: Yes    Birth control/protection: Surgical

## 2023-09-11 ENCOUNTER — Encounter: Payer: Self-pay | Admitting: Orthopaedic Surgery

## 2023-09-13 ENCOUNTER — Other Ambulatory Visit: Payer: Self-pay | Admitting: Physician Assistant

## 2023-09-13 ENCOUNTER — Other Ambulatory Visit (HOSPITAL_COMMUNITY): Payer: Self-pay

## 2023-09-13 MED ORDER — TRAMADOL HCL 50 MG PO TABS
50.0000 mg | ORAL_TABLET | Freq: Two times a day (BID) | ORAL | 2 refills | Status: DC | PRN
  Filled 2023-09-13: qty 60, 30d supply, fill #0

## 2023-09-13 NOTE — Telephone Encounter (Signed)
 sent

## 2023-09-24 ENCOUNTER — Other Ambulatory Visit (HOSPITAL_COMMUNITY): Payer: Self-pay

## 2023-09-24 NOTE — Pre-Procedure Instructions (Addendum)
 Surgical Instructions   Your procedure is scheduled on Monday, June 2nd. Report to [facility] Main Entrance "A" at 05:30 A.M., then check in with the Admitting office. Any questions or running late day of surgery: call [phone]  Questions prior to your surgery date: call [phone], Monday-Friday, 8am-4pm. If you experience any cold or flu symptoms such as cough, fever, chills, shortness of breath, etc. between now and your scheduled surgery, please notify us  at the above number.     Remember:  Do not eat after midnight the night before your surgery  You may drink clear liquids until 04:15 AM the morning of your surgery.   Clear liquids allowed are: Water, Non-Citrus Juices (without pulp), Carbonated Beverages, Clear Tea (no milk, honey, etc.), Black Coffee Only (NO MILK, CREAM OR POWDERED CREAMER of any kind), and Gatorade.  Patient Instructions  The night before surgery:  No food after midnight. ONLY clear liquids after midnight  The day of surgery (if you do NOT have diabetes):  Drink ONE (1) Pre-Surgery Clear Ensure by 04:15 AM the morning of surgery. Drink in one sitting. Do not sip.  This drink was given to you during your hospital  pre-op appointment visit.  Nothing else to drink after completing the  Pre-Surgery Clear Ensure.         If you have questions, please contact your surgeon's office.    Take these medicines the morning of surgery with A SIP OF WATER  cetirizine (ZYRTEC)  estradiol  (ESTRACE )  levothyroxine  (SYNTHROID )  pantoprazole  (PROTONIX )  rosuvastatin  (CRESTOR )   May take these medicines IF NEEDED: traMADol  (ULTRAM )   Follow your surgeon's instructions on when to stop Aspirin .  If no instructions were given by your surgeon then you will need to call the office to get those instructions.    One week prior to surgery, STOP taking any Aleve, Naproxen, Ibuprofen, Motrin, Advil, Goody's, BC's, all herbal medications, fish oil, and non-prescription  vitamins. This includes diclofenac  (VOLTAREN ).                     Do NOT Smoke (Tobacco/Vaping) for 24 hours prior to your procedure.  If you use a CPAP at night, you may bring your mask/headgear for your overnight stay.   You will be asked to remove any contacts, glasses, piercing's, hearing aid's, dentures/partials prior to surgery. Please bring cases for these items if needed.    Patients discharged the day of surgery will not be allowed to drive home, and someone needs to stay with them for 24 hours.  SURGICAL WAITING ROOM VISITATION Patients may have no more than 2 support people in the waiting area - these visitors may rotate.   Pre-op nurse will coordinate an appropriate time for 1 ADULT support person, who may not rotate, to accompany patient in pre-op.  Children under the age of 35 must have an adult with them who is not the patient and must remain in the main waiting area with an adult.  If the patient needs to stay at the hospital during part of their recovery, the visitor guidelines for inpatient rooms apply.  Please refer to the [facility] website for the visitor guidelines for any additional information.   If you received a COVID test during your pre-op visit  it is requested that you wear a mask when out in public, stay away from anyone that may not be feeling well and notify your surgeon if you develop symptoms. If you have been in  contact with anyone that has tested positive in the last 10 days please notify you surgeon.      Pre-operative 5 CHG Bathing Instructions   You can play a key role in reducing the risk of infection after surgery. Your skin needs to be as free of germs as possible. You can reduce the number of germs on your skin by washing with CHG (chlorhexidine  gluconate) soap before surgery. CHG is an antiseptic soap that kills germs and continues to kill germs even after washing.   DO NOT use if you have an allergy to chlorhexidine /CHG or antibacterial  soaps. If your skin becomes reddened or irritated, stop using the CHG and notify one of our RNs at [phone].   Please shower with the CHG soap starting 4 days before surgery using the following schedule:     Please keep in mind the following:  DO NOT shave, including legs and underarms, starting the day of your first shower.   You may shave your face at any point before/day of surgery.  Place clean sheets on your bed the day you start using CHG soap. Use a clean washcloth (not used since being washed) for each shower. DO NOT sleep with pets once you start using the CHG.   CHG Shower Instructions:  Wash your face and private area with normal soap. If you choose to wash your hair, wash first with your normal shampoo.  After you use shampoo/soap, rinse your hair and body thoroughly to remove shampoo/soap residue.  Turn the water OFF and apply about 3 tablespoons (45 ml) of CHG soap to a CLEAN washcloth.  Apply CHG soap ONLY FROM YOUR NECK DOWN TO YOUR TOES (washing for 3-5 minutes)  DO NOT use CHG soap on face, private areas, open wounds, or sores.  Pay special attention to the area where your surgery is being performed.  If you are having back surgery, having someone wash your back for you may be helpful. Wait 2 minutes after CHG soap is applied, then you may rinse off the CHG soap.  Pat dry with a clean towel  Put on clean clothes/pajamas   If you choose to wear lotion, please use ONLY the CHG-compatible lotions that are listed below.  Additional instructions for the day of surgery: DO NOT APPLY any lotions, deodorants, cologne, or perfumes.   Do not bring valuables to the hospital. [facility] is not responsible for any belongings/valuables. Do not wear nail polish, gel polish, artificial nails, or any other type of covering on natural nails (fingers and toes) Do not wear jewelry or makeup Put on clean/comfortable clothes.  Please brush your teeth.  Ask your nurse before applying  any prescription medications to the skin.     CHG Compatible Lotions   Aveeno Moisturizing lotion  Cetaphil Moisturizing Cream  Cetaphil Moisturizing Lotion  Clairol Herbal Essence Moisturizing Lotion, Dry Skin  Clairol Herbal Essence Moisturizing Lotion, Extra Dry Skin  Clairol Herbal Essence Moisturizing Lotion, Normal Skin  Curel Age Defying Therapeutic Moisturizing Lotion with Alpha Hydroxy  Curel Extreme Care Body Lotion  Curel Soothing Hands Moisturizing Hand Lotion  Curel Therapeutic Moisturizing Cream, Fragrance-Free  Curel Therapeutic Moisturizing Lotion, Fragrance-Free  Curel Therapeutic Moisturizing Lotion, Original Formula  Eucerin Daily Replenishing Lotion  Eucerin Dry Skin Therapy Plus Alpha Hydroxy Crme  Eucerin Dry Skin Therapy Plus Alpha Hydroxy Lotion  Eucerin Original Crme  Eucerin Original Lotion  Eucerin Plus Crme Eucerin Plus Lotion  Eucerin TriLipid Replenishing Lotion  Keri Anti-Bacterial Hand  Lotion  Keri Deep Conditioning Original Lotion Dry Skin Formula Softly Scented  Keri Deep Conditioning Original Lotion, Fragrance Free Sensitive Skin Formula  Keri Lotion Fast Absorbing Fragrance Free Sensitive Skin Formula  Keri Lotion Fast Absorbing Softly Scented Dry Skin Formula  Keri Original Lotion  Keri Skin Renewal Lotion Keri Silky Smooth Lotion  Keri Silky Smooth Sensitive Skin Lotion  Nivea Body Creamy Conditioning Oil  Nivea Body Extra Enriched Lotion  Nivea Body Original Lotion  Nivea Body Sheer Moisturizing Lotion Nivea Crme  Nivea Skin Firming Lotion  NutraDerm 30 Skin Lotion  NutraDerm Skin Lotion  NutraDerm Therapeutic Skin Cream  NutraDerm Therapeutic Skin Lotion  ProShield Protective Hand Cream  Provon moisturizing lotion  Please read over the following fact sheets that you were given.

## 2023-09-27 ENCOUNTER — Encounter (HOSPITAL_COMMUNITY)
Admission: RE | Admit: 2023-09-27 | Discharge: 2023-09-27 | Disposition: A | Discharge status: Home / Self Care | Source: Ambulatory Visit | Attending: Orthopaedic Surgery | Admitting: Orthopaedic Surgery

## 2023-09-27 ENCOUNTER — Encounter (HOSPITAL_COMMUNITY): Payer: Self-pay

## 2023-09-27 ENCOUNTER — Other Ambulatory Visit: Payer: Self-pay

## 2023-09-27 VITALS — BP 127/85 | HR 75 | Temp 98.1°F | Resp 18 | Ht 62.0 in | Wt 169.7 lb

## 2023-09-27 DIAGNOSIS — Z01812 Encounter for preprocedural laboratory examination: Secondary | ICD-10-CM | POA: Insufficient documentation

## 2023-09-27 DIAGNOSIS — M1711 Unilateral primary osteoarthritis, right knee: Secondary | ICD-10-CM | POA: Insufficient documentation

## 2023-09-27 DIAGNOSIS — Z01818 Encounter for other preprocedural examination: Secondary | ICD-10-CM

## 2023-09-27 LAB — BASIC METABOLIC PANEL WITH GFR
Anion gap: 7 (ref 5–15)
BUN: 15 mg/dL (ref 8–23)
CO2: 26 mmol/L (ref 22–32)
Calcium: 9.4 mg/dL (ref 8.9–10.3)
Chloride: 104 mmol/L (ref 98–111)
Creatinine, Ser: 0.85 mg/dL (ref 0.44–1.00)
GFR, Estimated: >60 mL/min (ref >60)
Glucose, Bld: 93 mg/dL (ref 70–99)
Potassium: 4.3 mmol/L (ref 3.5–5.1)
Sodium: 137 mmol/L (ref 135–145)

## 2023-09-27 LAB — CBC
HCT: 36.3 % (ref 36.0–46.0)
Hemoglobin: 11.8 g/dL — ABNORMAL LOW (ref 12.0–15.0)
MCH: 30.4 pg (ref 26.0–34.0)
MCHC: 32.5 g/dL (ref 30.0–36.0)
MCV: 93.6 fL (ref 80.0–100.0)
Platelets: 355 10*3/uL (ref 150–400)
RBC: 3.88 MIL/uL (ref 3.87–5.11)
RDW: 14.2 % (ref 11.5–15.5)
WBC: 8.1 10*3/uL (ref 4.0–10.5)
nRBC: 0 % (ref 0.0–0.2)

## 2023-09-27 LAB — SURGICAL PCR SCREEN
MRSA, PCR: NEGATIVE
Staphylococcus aureus: NEGATIVE

## 2023-09-27 NOTE — Progress Notes (Signed)
 PCP - Dr. Rosalia Colonel Cardiologist - denies  PPM/ICD - denies   Chest x-ray - 12/24/22 EKG - 08/19/22 (n/a) Stress Test - denies ECHO - denies Cardiac Cath - denies  Sleep Study - denies   DM- denies  Last dose of GLP1 agonist-  n/a   Blood Thinner Instructions: n/a Aspirin  Instructions: f/u with surgeon  ERAS Protcol - clears until 0415 PRE-SURGERY Ensure given  COVID TEST- n/a   Anesthesia review: no  Patient denies shortness of breath, fever, cough and chest pain at PAT appointment   All instructions explained to the patient, with a verbal understanding of the material. Patient agrees to go over the instructions while at home for a better understanding.  The opportunity to ask questions was provided.

## 2023-09-28 ENCOUNTER — Other Ambulatory Visit: Payer: Self-pay | Admitting: Physician Assistant

## 2023-09-28 ENCOUNTER — Other Ambulatory Visit (HOSPITAL_COMMUNITY): Payer: Self-pay

## 2023-09-28 MED ORDER — METHOCARBAMOL 750 MG PO TABS
750.0000 mg | ORAL_TABLET | Freq: Three times a day (TID) | ORAL | 2 refills | Status: DC | PRN
  Filled 2023-09-28: qty 30, 10d supply, fill #0

## 2023-09-28 MED ORDER — ONDANSETRON HCL 4 MG PO TABS
4.0000 mg | ORAL_TABLET | Freq: Three times a day (TID) | ORAL | 0 refills | Status: AC | PRN
  Filled 2023-09-28: qty 40, 14d supply, fill #0

## 2023-09-28 MED ORDER — OXYCODONE-ACETAMINOPHEN 5-325 MG PO TABS
1.0000 | ORAL_TABLET | Freq: Four times a day (QID) | ORAL | 0 refills | Status: DC | PRN
  Filled 2023-09-28: qty 27, 3d supply, fill #0
  Filled 2023-09-28: qty 13, 2d supply, fill #0

## 2023-09-28 MED ORDER — DOCUSATE SODIUM 100 MG PO CAPS
100.0000 mg | ORAL_CAPSULE | Freq: Every day | ORAL | 2 refills | Status: DC | PRN
  Filled 2023-09-28: qty 30, 30d supply, fill #0

## 2023-09-29 ENCOUNTER — Other Ambulatory Visit (HOSPITAL_COMMUNITY): Payer: Self-pay

## 2023-10-10 NOTE — Anesthesia Preprocedure Evaluation (Addendum)
 Anesthesia Evaluation  Patient identified by MRN, date of birth, ID band Patient awake    Reviewed: Allergy & Precautions, NPO status , Patient's Chart, lab work & pertinent test results  Airway Mallampati: II  TM Distance: >3 FB Neck ROM: Full    Dental no notable dental hx. (+) Teeth Intact, Dental Advisory Given   Pulmonary    Pulmonary exam normal breath sounds clear to auscultation       Cardiovascular (-) angina + CAD  (-) Past MI Normal cardiovascular exam Rhythm:Regular Rate:Normal     Neuro/Psych  Neuromuscular disease    GI/Hepatic ,GERD  Medicated and Controlled,,  Endo/Other  Hypothyroidism    Renal/GU Lab Results      Component                Value               Date                               K                        4.3                 09/27/2023                CO2                      26                  09/27/2023                BUN                      15                  09/27/2023                CREATININE               0.85                09/27/2023                GFRNONAA                 >60                 09/27/2023                    Musculoskeletal  (+) Arthritis , Osteoarthritis,    Abdominal   Peds  Hematology Lab Results      Component                Value               Date                      WBC                      8.1                 09/27/2023                HGB  11.8 (L)            09/27/2023                HCT                      36.3                09/27/2023                MCV                      93.6                09/27/2023                PLT                      355                 09/27/2023              Anesthesia Other Findings [id number]  Reproductive/Obstetrics                             Anesthesia Physical Anesthesia Plan  ASA: 2  Anesthesia Plan: Epidural and Regional   Post-op Pain Management: Regional block*  and Minimal or no pain anticipated   Induction:   PONV Risk Score and Plan: Propofol  infusion, Treatment may vary due to age or medical condition, Midazolam  and Ondansetron   Airway Management Planned: Natural Airway and Nasal Cannula  Additional Equipment: None  Intra-op Plan:   Post-operative Plan:   Informed Consent: I have reviewed the patients History and Physical, chart, labs and discussed the procedure including the risks, benefits and alternatives for the proposed anesthesia with the patient or authorized representative who has indicated his/her understanding and acceptance.     Dental advisory given  Plan Discussed with: CRNA and Surgeon  Anesthesia Plan Comments: (Spinal w R adductor canal block)       Anesthesia Quick Evaluation

## 2023-10-11 ENCOUNTER — Ambulatory Visit (HOSPITAL_COMMUNITY): Payer: Self-pay | Admitting: Anesthesiology

## 2023-10-11 ENCOUNTER — Ambulatory Visit (HOSPITAL_COMMUNITY)

## 2023-10-11 ENCOUNTER — Observation Stay (HOSPITAL_COMMUNITY)
Admission: RE | Admit: 2023-10-11 | Discharge: 2023-10-11 | Disposition: A | Discharge status: Home / Self Care | Source: Ambulatory Visit | Attending: Orthopaedic Surgery | Admitting: Orthopaedic Surgery

## 2023-10-11 ENCOUNTER — Other Ambulatory Visit: Payer: Self-pay

## 2023-10-11 ENCOUNTER — Encounter (HOSPITAL_COMMUNITY)
Admission: RE | Disposition: A | Discharge status: Home / Self Care | Payer: Self-pay | Source: Ambulatory Visit | Attending: Orthopaedic Surgery

## 2023-10-11 ENCOUNTER — Other Ambulatory Visit (HOSPITAL_COMMUNITY): Payer: Self-pay

## 2023-10-11 ENCOUNTER — Other Ambulatory Visit: Payer: Self-pay | Admitting: Physician Assistant

## 2023-10-11 ENCOUNTER — Encounter (HOSPITAL_COMMUNITY): Payer: Self-pay | Admitting: Orthopaedic Surgery

## 2023-10-11 DIAGNOSIS — M25561 Pain in right knee: Secondary | ICD-10-CM | POA: Diagnosis not present

## 2023-10-11 DIAGNOSIS — M1711 Unilateral primary osteoarthritis, right knee: Secondary | ICD-10-CM

## 2023-10-11 DIAGNOSIS — E039 Hypothyroidism, unspecified: Secondary | ICD-10-CM | POA: Diagnosis not present

## 2023-10-11 DIAGNOSIS — R609 Edema, unspecified: Secondary | ICD-10-CM | POA: Diagnosis not present

## 2023-10-11 DIAGNOSIS — M1611 Unilateral primary osteoarthritis, right hip: Secondary | ICD-10-CM | POA: Diagnosis not present

## 2023-10-11 DIAGNOSIS — Z7982 Long term (current) use of aspirin: Secondary | ICD-10-CM | POA: Insufficient documentation

## 2023-10-11 DIAGNOSIS — Z96651 Presence of right artificial knee joint: Secondary | ICD-10-CM

## 2023-10-11 DIAGNOSIS — F1090 Alcohol use, unspecified, uncomplicated: Secondary | ICD-10-CM | POA: Diagnosis not present

## 2023-10-11 DIAGNOSIS — G8918 Other acute postprocedural pain: Secondary | ICD-10-CM | POA: Diagnosis not present

## 2023-10-11 HISTORY — PX: PARTIAL KNEE ARTHROPLASTY: SHX2174

## 2023-10-11 SURGERY — ARTHROPLASTY, KNEE, UNICOMPARTMENTAL
Anesthesia: Regional | Site: Knee | Laterality: Right

## 2023-10-11 MED ORDER — METHOCARBAMOL 1000 MG/10ML IJ SOLN: 500.0000 mg | Freq: Four times a day (QID) | INTRAMUSCULAR | Status: DC | PRN

## 2023-10-11 MED ORDER — ASPIRIN 81 MG PO CHEW: 81.0000 mg | CHEWABLE_TABLET | Freq: Two times a day (BID) | ORAL | Status: DC

## 2023-10-11 MED ORDER — FENTANYL CITRATE (PF) 250 MCG/5ML IJ SOLN
INTRAMUSCULAR | Status: DC | PRN
  Administered 2023-10-11 (×2): 50 ug via INTRAVENOUS

## 2023-10-11 MED ORDER — BUPIVACAINE-MELOXICAM ER 400-12 MG/14ML IJ SOLN
INTRAMUSCULAR | Status: AC
  Filled 2023-10-11: qty 1

## 2023-10-11 MED ORDER — MIDAZOLAM HCL 2 MG/2ML IJ SOLN
INTRAMUSCULAR | Status: DC | PRN
  Administered 2023-10-11 (×2): 1 mg via INTRAVENOUS

## 2023-10-11 MED ORDER — POVIDONE-IODINE 10 % EX SWAB
2.0000 | Freq: Once | CUTANEOUS | Status: AC
  Administered 2023-10-11: 2 via TOPICAL

## 2023-10-11 MED ORDER — DOCUSATE SODIUM 100 MG PO CAPS
100.0000 mg | ORAL_CAPSULE | Freq: Two times a day (BID) | ORAL | Status: DC
  Administered 2023-10-11: 100 mg via ORAL
  Filled 2023-10-11: qty 1

## 2023-10-11 MED ORDER — PHENYLEPHRINE HCL-NACL 20-0.9 MG/250ML-% IV SOLN
INTRAVENOUS | Status: DC | PRN
  Administered 2023-10-11: 25 ug/min via INTRAVENOUS

## 2023-10-11 MED ORDER — BUPIVACAINE-MELOXICAM ER 400-12 MG/14ML IJ SOLN
INTRAMUSCULAR | Status: DC | PRN
  Administered 2023-10-11: 400 mg

## 2023-10-11 MED ORDER — TRANEXAMIC ACID-NACL 1000-0.7 MG/100ML-% IV SOLN
1000.0000 mg | Freq: Once | INTRAVENOUS | Status: AC
  Administered 2023-10-11: 1000 mg via INTRAVENOUS
  Filled 2023-10-11: qty 100

## 2023-10-11 MED ORDER — ROPIVACAINE HCL 5 MG/ML IJ SOLN
INTRAMUSCULAR | Status: DC | PRN
  Administered 2023-10-11: 30 mL via PERINEURAL

## 2023-10-11 MED ORDER — OXYCODONE HCL 5 MG/5ML PO SOLN: 5.0000 mg | Freq: Once | ORAL | Status: DC | PRN

## 2023-10-11 MED ORDER — CEFAZOLIN SODIUM-DEXTROSE 2-4 GM/100ML-% IV SOLN
2.0000 g | Freq: Four times a day (QID) | INTRAVENOUS | Status: DC
  Administered 2023-10-11: 2 g via INTRAVENOUS
  Filled 2023-10-11: qty 100

## 2023-10-11 MED ORDER — ACETAMINOPHEN 10 MG/ML IV SOLN: 1000.0000 mg | Freq: Once | INTRAVENOUS | Status: DC | PRN

## 2023-10-11 MED ORDER — ACETAMINOPHEN 500 MG PO TABS
1000.0000 mg | ORAL_TABLET | Freq: Four times a day (QID) | ORAL | Status: DC
  Administered 2023-10-11: 1000 mg via ORAL
  Filled 2023-10-11: qty 2

## 2023-10-11 MED ORDER — CHLORHEXIDINE GLUCONATE 0.12 % MT SOLN
15.0000 mL | Freq: Once | OROMUCOSAL | Status: AC
  Administered 2023-10-11: 15 mL via OROMUCOSAL
  Filled 2023-10-11: qty 15

## 2023-10-11 MED ORDER — APIXABAN 2.5 MG PO TABS
ORAL_TABLET | ORAL | 0 refills | Status: DC
  Filled 2023-10-11: qty 60, fill #0

## 2023-10-11 MED ORDER — BUPIVACAINE IN DEXTROSE 0.75-8.25 % IT SOLN
INTRATHECAL | Status: DC | PRN
  Administered 2023-10-11: 12 mg via INTRATHECAL

## 2023-10-11 MED ORDER — ACETAMINOPHEN 325 MG PO TABS: 325.0000 mg | ORAL_TABLET | Freq: Four times a day (QID) | ORAL | Status: DC | PRN

## 2023-10-11 MED ORDER — METOCLOPRAMIDE HCL 5 MG PO TABS: 5.0000 mg | ORAL_TABLET | Freq: Three times a day (TID) | ORAL | Status: DC | PRN

## 2023-10-11 MED ORDER — TRANEXAMIC ACID 1000 MG/10ML IV SOLN
2000.0000 mg | INTRAVENOUS | Status: DC
  Filled 2023-10-11 (×2): qty 20

## 2023-10-11 MED ORDER — OXYCODONE HCL ER 10 MG PO T12A
10.0000 mg | EXTENDED_RELEASE_TABLET | Freq: Two times a day (BID) | ORAL | Status: DC
  Administered 2023-10-11: 10 mg via ORAL
  Filled 2023-10-11: qty 1

## 2023-10-11 MED ORDER — LACTATED RINGERS IV BOLUS: 250.0000 mL | Freq: Once | INTRAVENOUS | Status: AC

## 2023-10-11 MED ORDER — OXYCODONE HCL 5 MG PO TABS
10.0000 mg | ORAL_TABLET | ORAL | Status: DC | PRN
  Administered 2023-10-11: 10 mg via ORAL
  Filled 2023-10-11: qty 2

## 2023-10-11 MED ORDER — IRRISEPT - 450ML BOTTLE WITH 0.05% CHG IN STERILE WATER, USP 99.95% OPTIME
TOPICAL | Status: DC | PRN
  Administered 2023-10-11: 450 mL

## 2023-10-11 MED ORDER — ONDANSETRON HCL 4 MG PO TABS: 4.0000 mg | ORAL_TABLET | Freq: Four times a day (QID) | ORAL | Status: DC | PRN

## 2023-10-11 MED ORDER — SODIUM CHLORIDE 0.9 % IR SOLN
Status: DC | PRN
  Administered 2023-10-11: 1000 mL

## 2023-10-11 MED ORDER — PROPOFOL 500 MG/50ML IV EMUL
INTRAVENOUS | Status: DC | PRN
  Administered 2023-10-11: 100 ug/kg/min via INTRAVENOUS

## 2023-10-11 MED ORDER — FENTANYL CITRATE (PF) 250 MCG/5ML IJ SOLN
INTRAMUSCULAR | Status: AC
Start: 2023-10-11 — End: ?
  Filled 2023-10-11: qty 5

## 2023-10-11 MED ORDER — CEFAZOLIN SODIUM-DEXTROSE 2-4 GM/100ML-% IV SOLN
2.0000 g | INTRAVENOUS | Status: AC
  Administered 2023-10-11: 2 g via INTRAVENOUS
  Filled 2023-10-11: qty 100

## 2023-10-11 MED ORDER — DEXAMETHASONE SODIUM PHOSPHATE 10 MG/ML IJ SOLN: 10.0000 mg | Freq: Once | INTRAMUSCULAR | Status: DC

## 2023-10-11 MED ORDER — OXYCODONE HCL 5 MG PO TABS: 5.0000 mg | ORAL_TABLET | Freq: Once | ORAL | Status: DC | PRN

## 2023-10-11 MED ORDER — APIXABAN 2.5 MG PO TABS
2.5000 mg | ORAL_TABLET | Freq: Two times a day (BID) | ORAL | 0 refills | Status: DC
  Filled 2023-10-11: qty 60, 30d supply, fill #0

## 2023-10-11 MED ORDER — KETOROLAC TROMETHAMINE 15 MG/ML IJ SOLN
7.5000 mg | Freq: Four times a day (QID) | INTRAMUSCULAR | Status: DC
  Administered 2023-10-11: 7.5 mg via INTRAVENOUS
  Filled 2023-10-11: qty 1

## 2023-10-11 MED ORDER — VANCOMYCIN HCL 1000 MG IV SOLR
INTRAVENOUS | Status: AC
Start: 2023-10-11 — End: ?
  Filled 2023-10-11: qty 20

## 2023-10-11 MED ORDER — ORAL CARE MOUTH RINSE: 15.0000 mL | Freq: Once | OROMUCOSAL | Status: AC

## 2023-10-11 MED ORDER — HYDROMORPHONE HCL 1 MG/ML IJ SOLN: 0.5000 mg | INTRAMUSCULAR | Status: DC | PRN

## 2023-10-11 MED ORDER — LEVOTHYROXINE SODIUM 75 MCG PO TABS: 75.0000 ug | ORAL_TABLET | Freq: Every day | ORAL | Status: DC

## 2023-10-11 MED ORDER — METOCLOPRAMIDE HCL 5 MG/ML IJ SOLN: 5.0000 mg | Freq: Three times a day (TID) | INTRAMUSCULAR | Status: DC | PRN

## 2023-10-11 MED ORDER — TRANEXAMIC ACID 1000 MG/10ML IV SOLN
INTRAVENOUS | Status: DC | PRN
  Administered 2023-10-11: 2000 mg via TOPICAL

## 2023-10-11 MED ORDER — PROPOFOL 10 MG/ML IV BOLUS
INTRAVENOUS | Status: AC
  Filled 2023-10-11: qty 20

## 2023-10-11 MED ORDER — MIDAZOLAM HCL 2 MG/2ML IJ SOLN
INTRAMUSCULAR | Status: AC
Start: 2023-10-11 — End: ?
  Filled 2023-10-11: qty 2

## 2023-10-11 MED ORDER — OXYCODONE HCL 5 MG PO TABS: 5.0000 mg | ORAL_TABLET | ORAL | Status: DC | PRN

## 2023-10-11 MED ORDER — PHENOL 1.4 % MT LIQD
1.0000 | OROMUCOSAL | Status: DC | PRN
Start: 2023-10-11 — End: 2023-10-11

## 2023-10-11 MED ORDER — ONDANSETRON HCL 4 MG/2ML IJ SOLN: 4.0000 mg | Freq: Four times a day (QID) | INTRAMUSCULAR | Status: DC | PRN

## 2023-10-11 MED ORDER — 0.9 % SODIUM CHLORIDE (POUR BTL) OPTIME
TOPICAL | Status: DC | PRN
  Administered 2023-10-11: 1000 mL

## 2023-10-11 MED ORDER — HYDROMORPHONE HCL 1 MG/ML IJ SOLN: 0.2500 mg | INTRAMUSCULAR | Status: DC | PRN

## 2023-10-11 MED ORDER — MENTHOL 3 MG MT LOZG: 1.0000 | LOZENGE | OROMUCOSAL | Status: DC | PRN

## 2023-10-11 MED ORDER — TRANEXAMIC ACID-NACL 1000-0.7 MG/100ML-% IV SOLN
1000.0000 mg | INTRAVENOUS | Status: AC
  Administered 2023-10-11: 1000 mg via INTRAVENOUS
  Filled 2023-10-11: qty 100

## 2023-10-11 MED ORDER — ONDANSETRON HCL 4 MG/2ML IJ SOLN: 4.0000 mg | Freq: Once | INTRAMUSCULAR | Status: DC | PRN

## 2023-10-11 MED ORDER — LACTATED RINGERS IV SOLN: INTRAVENOUS | Status: DC

## 2023-10-11 MED ORDER — CLONIDINE HCL (ANALGESIA) 100 MCG/ML EP SOLN
EPIDURAL | Status: DC | PRN
  Administered 2023-10-11: 100 ug

## 2023-10-11 MED ORDER — LACTATED RINGERS IV BOLUS: 500.0000 mL | Freq: Once | INTRAVENOUS | Status: AC

## 2023-10-11 MED ORDER — METHOCARBAMOL 500 MG PO TABS
500.0000 mg | ORAL_TABLET | Freq: Four times a day (QID) | ORAL | Status: DC | PRN
Start: 2023-10-11 — End: 2023-10-11
  Administered 2023-10-11: 500 mg via ORAL
  Filled 2023-10-11: qty 1

## 2023-10-11 MED ORDER — VANCOMYCIN HCL 1000 MG IV SOLR
INTRAVENOUS | Status: DC | PRN
Start: 2023-10-11 — End: 2023-10-11
  Administered 2023-10-11: 1000 mg

## 2023-10-11 SURGICAL SUPPLY — 79 items
ALCOHOL 70% 16 OZ (MISCELLANEOUS) ×1 IMPLANT
BAG COUNTER SPONGE SURGICOUNT (BAG) ×1 IMPLANT
BAG DECANTER FOR FLEXI CONT (MISCELLANEOUS) ×1 IMPLANT
BANDAGE ESMARK 6X9 LF (GAUZE/BANDAGES/DRESSINGS) IMPLANT
BLADE SAG 18X100X1.27 (BLADE) ×1 IMPLANT
BLADE SAW RECIP 77.5X11 (BLADE) IMPLANT
BLADE SAW SGTL 73X25 THK (BLADE) ×1 IMPLANT
BOWL SMART MIX CTS (DISPOSABLE) ×1 IMPLANT
CEMENT BONE REFOBACIN R1X40 US (Cement) IMPLANT
CLSR STERI-STRIP ANTIMIC 1/2X4 (GAUZE/BANDAGES/DRESSINGS) ×2 IMPLANT
COMP FEM PRSN CEM SZ 3 RT (Orthopedic Implant) IMPLANT
COMPONENT PARTIAL KNEE PS E RM (Joint) IMPLANT
COOLER ICEMAN CLASSIC (MISCELLANEOUS) ×1 IMPLANT
COVER SURGICAL LIGHT HANDLE (MISCELLANEOUS) ×1 IMPLANT
CUFF TOURN SGL QUICK 42 (TOURNIQUET CUFF) IMPLANT
CUFF TRNQT CYL 34X4.125X (TOURNIQUET CUFF) ×1 IMPLANT
DERMABOND ADVANCED .7 DNX12 (GAUZE/BANDAGES/DRESSINGS) ×1 IMPLANT
DRAPE EXTREMITY T 121X128X90 (DISPOSABLE) ×1 IMPLANT
DRAPE HALF SHEET 40X57 (DRAPES) ×1 IMPLANT
DRAPE INCISE IOBAN 66X45 STRL (DRAPES) ×1 IMPLANT
DRAPE POUCH INSTRU U-SHP 10X18 (DRAPES) ×1 IMPLANT
DRAPE SURG ORHT 6 SPLT 77X108 (DRAPES) ×2 IMPLANT
DRAPE U-SHAPE 47X51 STRL (DRAPES) ×2 IMPLANT
DRSG AQUACEL AG ADV 3.5X 6 (GAUZE/BANDAGES/DRESSINGS) IMPLANT
DRSG AQUACEL AG ADV 3.5X10 (GAUZE/BANDAGES/DRESSINGS) ×1 IMPLANT
DURAPREP 26ML APPLICATOR (WOUND CARE) ×3 IMPLANT
ELECT CAUTERY BLADE 6.4 (BLADE) ×1 IMPLANT
ELECT PENCIL ROCKER SW 15FT (MISCELLANEOUS) ×1 IMPLANT
ELECTRODE REM PT RTRN 9FT ADLT (ELECTROSURGICAL) ×1 IMPLANT
GLOVE BIOGEL PI IND STRL 7.0 (GLOVE) ×2 IMPLANT
GLOVE BIOGEL PI IND STRL 7.5 (GLOVE) ×5 IMPLANT
GLOVE ECLIPSE 7.0 STRL STRAW (GLOVE) ×3 IMPLANT
GLOVE INDICATOR 7.0 STRL GRN (GLOVE) ×1 IMPLANT
GLOVE INDICATOR 7.5 STRL GRN (GLOVE) ×1 IMPLANT
GLOVE SURG SYN 7.5 E (GLOVE) ×2 IMPLANT
GLOVE SURG SYN 7.5 PF PI (GLOVE) ×2 IMPLANT
GLOVE SURG UNDER LTX SZ7.5 (GLOVE) ×2 IMPLANT
GLOVE SURG UNDER POLY LF SZ7 (GLOVE) ×19 IMPLANT
GLOVE SURG UNDER POLY LF SZ7.5 (GLOVE) ×9 IMPLANT
GOWN STRL REUS W/ TWL LRG LVL3 (GOWN DISPOSABLE) ×1 IMPLANT
GOWN STRL SURGICAL XL XLNG (GOWN DISPOSABLE) ×2 IMPLANT
GOWN TOGA ZIPPER T7+ PEEL AWAY (MISCELLANEOUS) ×2 IMPLANT
HOOD PEEL AWAY T7 (MISCELLANEOUS) ×2 IMPLANT
INSERT TIB BEARING PS E 9 RT (Insert) IMPLANT
INSERTER TIP PARTIAL KNEE (MISCELLANEOUS) IMPLANT
KIT BASIN OR (CUSTOM PROCEDURE TRAY) ×1 IMPLANT
KIT TURNOVER KIT B (KITS) ×1 IMPLANT
LAVAGE JET IRRISEPT WOUND (IRRIGATION / IRRIGATOR) ×1 IMPLANT
MANIFOLD NEPTUNE II (INSTRUMENTS) ×1 IMPLANT
MARKER SKIN DUAL TIP RULER LAB (MISCELLANEOUS) ×2 IMPLANT
NDL SPNL 18GX3.5 QUINCKE PK (NEEDLE) ×1 IMPLANT
NEEDLE SPNL 18GX3.5 QUINCKE PK (NEEDLE) ×1 IMPLANT
NS IRRIG 1000ML POUR BTL (IV SOLUTION) ×1 IMPLANT
PACK TOTAL JOINT (CUSTOM PROCEDURE TRAY) ×1 IMPLANT
PAD ARMBOARD POSITIONER FOAM (MISCELLANEOUS) ×2 IMPLANT
PAD COLD SHLDR WRAP-ON (PAD) ×1 IMPLANT
PIN DRILL HDLS TROCAR 75 4PK (PIN) IMPLANT
SCREW HEADED 33MM KNEE (MISCELLANEOUS) IMPLANT
SCREW HEADED 48MM KNEE (MISCELLANEOUS) IMPLANT
SET HNDPC FAN SPRY TIP SCT (DISPOSABLE) ×1 IMPLANT
SOLUTION PRONTOSAN WOUND 350ML (IRRIGATION / IRRIGATOR) ×1 IMPLANT
STAPLER SKIN PROX 35W (STAPLE) ×1 IMPLANT
SUCTION TUBE FRAZIER 10FR DISP (SUCTIONS) ×1 IMPLANT
SUT ETHILON 2 0 FS 18 (SUTURE) IMPLANT
SUT MNCRL AB 3-0 PS2 27 (SUTURE) IMPLANT
SUT MNCRL AB 4-0 PS2 18 (SUTURE) IMPLANT
SUT STRATAFIX 1PDS 45CM VIOLET (SUTURE) IMPLANT
SUT VIC AB 0 CT1 27XBRD ANBCTR (SUTURE) ×2 IMPLANT
SUT VIC AB 1 CTX 27 (SUTURE) ×3 IMPLANT
SUT VIC AB 2-0 CT1 TAPERPNT 27 (SUTURE) ×4 IMPLANT
SYR 50ML LL SCALE MARK (SYRINGE) ×2 IMPLANT
TOWEL GREEN STERILE (TOWEL DISPOSABLE) ×1 IMPLANT
TOWEL GREEN STERILE FF (TOWEL DISPOSABLE) ×1 IMPLANT
TRAY CATH INTERMITTENT SS 16FR (CATHETERS) IMPLANT
TRAY FOLEY MTR SLVR 16FR STAT (SET/KITS/TRAYS/PACK) ×1 IMPLANT
TUBE SUCT ARGYLE STRL (TUBING) ×1 IMPLANT
UNDERPAD 30X36 HEAVY ABSORB (UNDERPADS AND DIAPERS) ×1 IMPLANT
WRAP KNEE MAXI GEL POST OP (GAUZE/BANDAGES/DRESSINGS) ×1 IMPLANT
YANKAUER SUCT BULB TIP NO VENT (SUCTIONS) ×2 IMPLANT

## 2023-10-11 NOTE — Anesthesia Procedure Notes (Signed)
 Procedure Name: MAC Date/Time: 10/11/2023 7:21 AM  Performed by: Colen Daunt, CRNAPre-anesthesia Checklist: Patient identified, Emergency Drugs available, Suction available and Patient being monitored Patient Re-evaluated:Patient Re-evaluated prior to induction Oxygen Delivery Method: Simple face mask Induction Type: IV induction Placement Confirmation: positive ETCO2 Dental Injury: Teeth and Oropharynx as per pre-operative assessment

## 2023-10-11 NOTE — Anesthesia Procedure Notes (Signed)
 Anesthesia Regional Block: Adductor canal block   Pre-Anesthetic Checklist: , timeout performed,  Correct Patient, Correct Site, Correct Laterality,  Correct Procedure, Correct Position, site marked,  Risks and benefits discussed,  Surgical consent,  Pre-op evaluation,  At surgeon's request and post-op pain management  Laterality: Lower and Right  Prep: chloraprep       Needles:  Injection technique: Single-shot  Needle Type: Echogenic Needle     Needle Length: 9cm  Needle Gauge: 22     Additional Needles:   Procedures:,,,, ultrasound used (permanent image in chart),,    Narrative:  Start time: 10/11/2023 6:55 AM End time: 10/11/2023 7:02 AM Injection made incrementally with aspirations every 5 mL.  Performed by: Personally  Anesthesiologist: Rosalita Combe, MD  Additional Notes: Block assessed prior to surgery. Pt tolerated procedure well.

## 2023-10-11 NOTE — Progress Notes (Signed)
 Orthopedic Tech Progress Note Patient Details:  Alisha Reed 03-13-1961 [id number]  Ortho Devices Type of Ortho Device: Bone foam zero knee Ortho Device/Splint Location: RLE Ortho Device/Splint Interventions: Ordered, Other (comment)   Post Interventions Patient Tolerated: Well Instructions Provided: Care of device  [name] 10/11/2023, 11:35 AM

## 2023-10-11 NOTE — Anesthesia Procedure Notes (Addendum)
 Spinal  Patient location during procedure: OR Start time: 10/11/2023 7:17 AM End time: 10/11/2023 7:20 AM Reason for block: surgical anesthesia Staffing Performed: anesthesiologist  Anesthesiologist: Rosalita Combe, MD Performed by: Rosalita Combe, MD Authorized by: Rosalita Combe, MD   Preanesthetic Checklist Completed: patient identified, IV checked, risks and benefits discussed, surgical consent, monitors and equipment checked, pre-op evaluation and timeout performed Spinal Block Patient position: sitting Prep: DuraPrep and site prepped and draped Patient monitoring: heart rate, cardiac monitor, continuous pulse ox and blood pressure Approach: midline Location: L3-4 Injection technique: single-shot Needle Needle type: Pencan  Needle gauge: 24 G Needle length: 10 cm Needle insertion depth: 4 cm Assessment Sensory level: T4 Events: CSF return Additional Notes  1 Attempt (s). Pt tolerated procedure well.

## 2023-10-11 NOTE — H&P (Signed)
 PREOPERATIVE H&P  Chief Complaint: right knee osteoarthritis  HPI: Alisha Reed is a 63 y.o. female who presents for surgical treatment of right knee osteoarthritis.  She denies any changes in medical history.  Past Surgical History:  Procedure Laterality Date   BILATERAL CARPAL TUNNEL RELEASE Bilateral 01/18/2019   Procedure: BILATERAL CARPAL TUNNEL RELEASE;  Surgeon: Wes Hamman, MD;  Location: [facility];  Service: Orthopedics;  Laterality: Bilateral;   BLADDER SUSPENSION N/A 08/10/2022   Procedure: TRANSVAGINAL TAPE (TVT) PROCEDURE;  Surgeon: Arma Lamp, MD;  Location: [facility];  Service: Gynecology;  Laterality: N/A;  Total time requested is 1 hour.   CHOLECYSTECTOMY     COLONOSCOPY  2011   CYSTOSCOPY N/A 08/10/2022   Procedure: CYSTOSCOPY;  Surgeon: Arma Lamp, MD;  Location: [facility];  Service: Gynecology;  Laterality: N/A;   FOOT SURGERY Bilateral    HYSTERECTOMY ABDOMINAL WITH SALPINGECTOMY     NASAL SINUS SURGERY     Social History   Socioeconomic History   Marital status: Married    Spouse name: Not on file   Number of children: 2   Years of education: 16   Highest education level: Not on file  Occupational History   Occupation: RN  Tobacco Use   Smoking status: Never   Smokeless tobacco: Never  Vaping Use   Vaping status: Never Used  Substance and Sexual Activity   Alcohol use: Yes    Comment: social- maybe once a month   Drug use: No   Sexual activity: Yes    Birth control/protection: Surgical  Other Topics Concern   Not on file  Social History Narrative   Fun/Hobby - Concerts, camping, grandchildren   Social Drivers of Corporate investment banker Strain: Not on file  Food Insecurity: Not on file  Transportation Needs: Not on file  Physical Activity: Not on file  Stress: Not on file  Social Connections: Not on file   Family History  Problem Relation Age of Onset    Scleroderma Mother    Rheum arthritis Mother    Heart disease Mother    Heart disease Father    Glaucoma Father    Stomach cancer Maternal Grandmother    Heart attack Maternal Grandfather    Alzheimer's disease Paternal Grandmother    Throat cancer Paternal Grandfather    Colon cancer Neg Hx    Liver cancer Neg Hx    Colon polyps Neg Hx    Esophageal cancer Neg Hx    Rectal cancer Neg Hx    No Known Allergies Prior to Admission medications   Medication Sig Start Date End Date Taking? Authorizing Provider  alendronate  (FOSAMAX ) 70 MG tablet Take 1 tablet (70 mg total) by mouth every 7 (seven) days. Take with a full glass of water on an empty stomach. 11/04/22  Yes Roslyn Coombe, MD  calcium  carbonate (OS-CAL - DOSED IN MG OF ELEMENTAL CALCIUM ) 1250 (500 Ca) MG tablet Take 1 tablet by mouth 2 (two) times daily with a meal.   Yes [provider]  cetirizine (ZYRTEC) 10 MG tablet Take 10 mg by mouth daily.   Yes [provider]  Cholecalciferol (VITAMIN D ) 50 MCG (2000 UT) CAPS Take 1 capsule by mouth daily.   Yes [provider]  diclofenac  (VOLTAREN ) 75 MG EC tablet Take 1 tablet (75 mg total) by mouth 2 (two) times daily as needed. Patient taking differently: Take 75 mg by  mouth 2 (two) times daily. 07/19/23  Yes Sandie Cross, PA-C  estradiol  (ESTRACE ) 1 MG tablet Take 1.5 tablets (1.5 mg total) by mouth daily. 06/30/23  Yes   Evolocumab  (REPATHA  SURECLICK) 140 MG/ML SOAJ Inject 140 mg into the skin every 14 (fourteen) days. 05/07/23  Yes Roslyn Coombe, MD  levothyroxine  (SYNTHROID ) 75 MCG tablet Take 1 tablet (75 mcg total) by mouth daily before breakfast. 12/09/22  Yes Roslyn Coombe, MD  pantoprazole  (PROTONIX ) 40 MG tablet Take 1 tablet (40 mg total) by mouth daily. 12/15/22  Yes Roslyn Coombe, MD  rosuvastatin  (CRESTOR ) 40 MG tablet Take 1 tablet (40 mg total) by mouth daily. 12/09/22  Yes Roslyn Coombe, MD  traMADol  (ULTRAM ) 50 MG tablet Take 1 tablet (50 mg  total) by mouth every 12 (twelve) hours as needed. 09/13/23  Yes Sandie Cross, PA-C  vitamin B-12 (CYANOCOBALAMIN ) 100 MCG tablet Take 100 mcg by mouth daily.   Yes [provider]  aspirin  EC 81 MG tablet Take 81 mg by mouth daily. Swallow whole.    [provider]  docusate sodium  (COLACE) 100 MG capsule Take 1 capsule (100 mg total) by mouth daily as needed. 09/28/23 09/27/24  Sandie Cross, PA-C  methocarbamol  (ROBAXIN ) 750 MG tablet Take 1 tablet (750 mg total) by mouth every 8 (eight) hours as needed for muscle spasms. 09/28/23   Sandie Cross, PA-C  ondansetron  (ZOFRAN ) 4 MG tablet Take 1 tablet (4 mg total) by mouth every 8 (eight) hours as needed. 09/28/23   Sandie Cross, PA-C  oxyCODONE -acetaminophen  (PERCOCET) 5-325 MG tablet Take 1-2 tablets by mouth every 6 (six) hours as needed. 09/28/23   Sandie Cross, PA-C     Positive ROS: All other systems have been reviewed and were otherwise negative with the exception of those mentioned in the HPI and as above.  Physical Exam: General: Alert, no acute distress Cardiovascular: No pedal edema Respiratory: No cyanosis, no use of accessory musculature GI: abdomen soft Skin: No lesions in the area of chief complaint Neurologic: Sensation intact distally Psychiatric: Patient is competent for consent with normal mood and affect Lymphatic: no lymphedema  MUSCULOSKELETAL: exam stable  Assessment: right knee osteoarthritis  Plan: Plan for Procedure(s): ARTHROPLASTY, KNEE, UNICOMPARTMENTAL ARTHROPLASTY, KNEE, TOTAL  The risks benefits and alternatives were discussed with the patient including but not limited to the risks of nonoperative treatment, versus surgical intervention including infection, bleeding, nerve injury,  blood clots, cardiopulmonary complications, morbidity, mortality, among others, and they were willing to proceed.   Claria Crofts, MD 10/11/2023 5:59 AM

## 2023-10-11 NOTE — Op Note (Addendum)
 Unicompartmental Knee Arthroplasty Procedure Note Alisha Reed [id number] 10/11/2023  Preoperative diagnosis: Right knee localized medial osteoarthritis  Postoperative diagnosis:same  Operative procedure: Right uncompartmental knee arthroplasty. CPT [phone]  Surgeon: N. Claria Crofts, MD  Assistants: Trellis Fries, PA-C necessary for opening, closing, retracting, limb positioning and facilitating the safe completion of the procedure  Anesthesia: Spinal, regional  Tourniquet time: 53 minutes  Implants used: Zimmer persona Femur: 3 Tibia:E Polyethylene: 9 mm  Indication: Alisha Reed is a 63 y.o. year old female with a history of knee pain. Having failed conservative management, the patient elected to proceed with a unicompartmental knee arthroplasty.  We have reviewed the risk and benefits of the surgery and they elected to proceed after voicing understanding.  Procedure:  After informed consent was obtained and understanding of the risk were voiced including but not limited to bleeding, infection, damage to surrounding structures including nerves and vessels, blood clots, leg length inequality and the failure to achieve desired results, the operative extremity was marked with verbal confirmation of the patient in the holding area.   The patient was then brought to the operating room and transported to the operating room table in the supine position.  A tourniquet was applied to the operative extremity around the upper thigh. The operative limb was then prepped and draped in the usual sterile fashion and preoperative antibiotics were administered.  A time out was performed prior to the start of surgery confirming the correct extremity, preoperative antibiotic administration, as well as team members, implants and instruments available for the case. Correct surgical site was also confirmed with preoperative radiographs. The limb was then elevated for exsanguination and the tourniquet was  inflated.  An incision was made from the tibial tubercle to the medial aspect of the patella.  Blunt dissection was performed down to the medial retinaculum and the patellar tendon. A limited medial parapatellar arthrotomy was performed.  A limited medial peel was performed to gain exposure. The patellofemoral compartment and the lateral compartments were inspected and there were no evidence of disease. The medial compartment showed grade 4 changes.  The decision was made to proceed with the unicompartmental arthroplasty. We placed the tibial cutting guide at the appropriate depth and slope and alignment. An angel wing was used to check the level of the resection. The tibia was resected using the guide taking 4 mm off. We then balanced the flexion and extension gaps.  We then placed the knee in extension in order to perform the distal femoral cut. The guide was pinned to the distal femur. The distal femur cut was performed. We then found the appropriate size of the femur to be a size 3. The chamfer cuts were then made. The lug holes were then drilled. We then turned our attention to sizing the tibia. Using the guide we found that the appropriate size to be E. The tibia was then prepared. We then removed the medial meniscus. Trial components were then placed for ligamentous testing. The knee was balanced in the full arc of motion. We then took out the trial components and thoroughly irrigated the wound. Sclerotic bone was drilled.  The bony surfaces were then dried. The components were cemented. A final 9 mm polyethylene liner was placed into the tibia. The final components were then again trialed and showed stability in the entire arc of motion.  The wound was then again thoroughly lavaged.  Zynrelief and vancomycin powder was placed in the joint.  The arthrotomy was closed with interrupted #  1 Vicryl and running #1 stratafix. Subcutaneous fat was closed with 0 Vicryl. Subcutaneous tissue was closed with 2-0 Vicryl.  The skin was closed with running 3-0 Monocryl and steri strips. Sterile dressings were applied. Patient tolerated the procedure well and no immediate complications.  Position: supine  Complications: none.  Time Out: performed   Drains/Packing: none  Estimated blood loss: 50 cc  Returned to Recovery Room: in good condition.   Antibiotics: yes   Mechanical VTE (DVT) Prophylaxis: sequential compression devices, TED thigh-high  Chemical VTE (DVT) Prophylaxis: aspirin   Fluid Replacement  Crystalloid: see anesthesia record Blood: none  FFP: none   Specimens Removed: 1 to pathology   Sponge and Instrument Count Correct? yes   PACU: portable radiograph - knee AP and Lateral   Admission: observation status, start PT & OT POD#1  Plan/RTC: Return in 2 weeks for wound check.   Weight Bearing/Load Lower Extremity: full   N. Claria Crofts, MD [facility] 8:50 AM

## 2023-10-11 NOTE — Transfer of Care (Signed)
 Immediate Anesthesia Transfer of Care Note  Patient: Alisha Reed  Procedure(s) Performed: ARTHROPLASTY, KNEE, UNICOMPARTMENTAL (Right: Knee)  Patient Location: PACU  Anesthesia Type:Spinal and MAC combined with regional for post-op pain  Level of Consciousness: awake, alert , oriented, and patient cooperative  Airway & Oxygen Therapy: Patient Spontanous Breathing  Post-op Assessment: Report given to RN and Post -op Vital signs reviewed and stable  Post vital signs: Reviewed and stable  Last Vitals:  Vitals Value Taken Time  BP 113/64 10/11/23 0924  Temp    Pulse 71 10/11/23 0926  Resp 17 10/11/23 0926  SpO2 98 % 10/11/23 0926  Vitals shown include unfiled device data.  Last Pain:  Vitals:   10/11/23 0608  TempSrc:   PainSc: 0-No pain         Complications: No notable events documented.

## 2023-10-11 NOTE — Evaluation (Signed)
 Physical Therapy Evaluation Patient Details Name: Alisha Reed MRN: [id number] DOB: [birth year] Today's Date: 10/11/2023  History of Present Illness  63 y.o. female presents to [facility] hospital on 10/11/2023 for elective R unicompartmental knee arthroplasty. PMH includes hypothyroidism, HLD, osteopenia.  Clinical Impression  Pt presents to PT with deficits in functional mobility, gait, balance, strength, ROM. Pt is able to ambulate for household distances with support of the RW. Pt also negotiates 3 steps to simulate entrance into her home. PT provides education on the TKR exercise packet and encourages frequent mobilization with staff assistance. PT will follow up tomorrow for a progression of gait and to initiate stair training if the pt remains admitted.        If plan is discharge home, recommend the following: Assistance with cooking/housework;Assist for transportation;Help with stairs or ramp for entrance   Can travel by private vehicle        Equipment Recommendations None recommended by PT  Recommendations for Other Services       Functional Status Assessment Patient has had a recent decline in their functional status and demonstrates the ability to make significant improvements in function in a reasonable and predictable amount of time.     Precautions / Restrictions Precautions Precautions: Fall Recall of Precautions/Restrictions: Intact Restrictions Weight Bearing Restrictions Per Provider Order: Yes RLE Weight Bearing Per Provider Order: Weight bearing as tolerated      Mobility  Bed Mobility Overal bed mobility: Modified Independent                  Transfers Overall transfer level: Needs assistance Equipment used: Rolling walker (2 wheels) Transfers: Sit to/from Stand Sit to Stand: Contact guard assist, Supervision           General transfer comment: CGA for initial transfer, supervision for following transfer with further cues for hand placement     Ambulation/Gait Ambulation/Gait assistance: Supervision Gait Distance (Feet): 150 Feet Assistive device: Rolling walker (2 wheels) Gait Pattern/deviations: Step-through pattern Gait velocity: reduced Gait velocity interpretation: <1.8 ft/sec, indicate of risk for recurrent falls   General Gait Details: slowed step-to gait initially progressing to a more continuous step-through pattern  Stairs Stairs: Yes Stairs assistance: Contact guard assist Stair Management: One rail Right, Sideways Number of Stairs: 3    Wheelchair Mobility     Tilt Bed    Modified Rankin (Stroke Patients Only)       Balance Overall balance assessment: Needs assistance Sitting-balance support: No upper extremity supported, Feet supported Sitting balance-Leahy Scale: Good     Standing balance support: Single extremity supported, Reliant on assistive device for balance Standing balance-Leahy Scale: Poor                               Pertinent Vitals/Pain Pain Assessment Pain Assessment: Faces Faces Pain Scale: Hurts even more Pain Location: R knee Pain Descriptors / Indicators: Sore Pain Intervention(s): Monitored during session    Home Living Family/patient expects to be discharged to:: Private residence Living Arrangements: Spouse/significant other Available Help at Discharge: Family;Available 24 hours/day Type of Home: House Home Access: Stairs to enter Entrance Stairs-Rails: Right;Left Entrance Stairs-Number of Steps: 2 Alternate Level Stairs-Number of Steps: 14 Home Layout: Two level Home Equipment: Agricultural consultant (2 wheels);Cane - single point;BSC/3in1;Shower seat;Toilet riser      Prior Function Prior Level of Function : Independent/Modified Independent;Working/employed;Driving             Mobility Comments:  working as a Engineer, civil (consulting) in the [employer] [place]       Extremity/Trunk Assessment   Upper Extremity Assessment Upper Extremity Assessment: Overall WFL for tasks  assessed    Lower Extremity Assessment Lower Extremity Assessment: RLE deficits/detail RLE Deficits / Details: generalized post-op weakness and ROM deficits as anticipated on POD 0 RLE Sensation: decreased light touch    Cervical / Trunk Assessment Cervical / Trunk Assessment: Normal  Communication   Communication Communication: No apparent difficulties    Cognition Arousal: Alert Behavior During Therapy: WFL for tasks assessed/performed   PT - Cognitive impairments: No apparent impairments                         Following commands: Intact       Cueing Cueing Techniques: Verbal cues     General Comments General comments (skin integrity, edema, etc.): VSS on RA    Exercises Other Exercises Other Exercises: PT provides education on TKR exercise packet   Assessment/Plan    PT Assessment Patient needs continued PT services  PT Problem List Decreased strength;Decreased range of motion;Decreased activity tolerance;Decreased balance;Decreased mobility;Decreased knowledge of use of DME;Pain;Impaired sensation       PT Treatment Interventions DME instruction;Gait training;Stair training;Functional mobility training;Therapeutic activities;Therapeutic exercise;Neuromuscular re-education;Balance training;Patient/family education    PT Goals (Current goals can be found in the Care Plan section)  Acute Rehab PT Goals Patient Stated Goal: to go home PT Goal Formulation: With patient Time For Goal Achievement: 10/15/23 Potential to Achieve Goals: Good    Frequency 7X/week     Co-evaluation               AM-PAC PT "6 Clicks" Mobility  Outcome Measure Help needed turning from your back to your side while in a flat bed without using bedrails?: None Help needed moving from lying on your back to sitting on the side of a flat bed without using bedrails?: None Help needed moving to and from a bed to a chair (including a wheelchair)?: A Little Help needed  standing up from a chair using your arms (e.g., wheelchair or bedside chair)?: A Little Help needed to walk in hospital room?: A Little Help needed climbing 3-5 steps with a railing? : A Little 6 Click Score: 20    End of Session Equipment Utilized During Treatment: Gait belt Activity Tolerance: Patient tolerated treatment well Patient left: in bed;with call bell/phone within reach Nurse Communication: Mobility status PT Visit Diagnosis: Other abnormalities of gait and mobility (R26.89);Muscle weakness (generalized) (M62.81)    Time: 1610-9604 PT Time Calculation (min) (ACUTE ONLY): 22 min   Charges:   PT Evaluation $PT Eval Low Complexity: 1 Low   PT General Charges $$ ACUTE PT VISIT: 1 Visit         Rexie Catena, PT, DPT Acute Rehabilitation Office [phone]   Rexie Catena 10/11/2023, 5:14 PM

## 2023-10-11 NOTE — Discharge Instructions (Addendum)

## 2023-10-11 NOTE — Progress Notes (Signed)
 CM has called all the home health agencies in [place] and none of them accept her insurance. CM has updated the bedside RN who will update the MD.  TOC following.

## 2023-10-11 NOTE — Plan of Care (Signed)
  Problem: Education: Goal: Knowledge of General Education information will improve Description: Including pain rating scale, medication(s)/side effects and non-pharmacologic comfort measures Outcome: Completed/Met   Problem: Health Behavior/Discharge Planning: Goal: Ability to manage health-related needs will improve Outcome: Completed/Met   Problem: Clinical Measurements: Goal: Ability to maintain clinical measurements within normal limits will improve Outcome: Completed/Met Goal: Will remain free from infection Outcome: Completed/Met Goal: Diagnostic test results will improve Outcome: Completed/Met Goal: Respiratory complications will improve Outcome: Completed/Met Goal: Cardiovascular complication will be avoided Outcome: Completed/Met   Problem: Activity: Goal: Risk for activity intolerance will decrease Outcome: Completed/Met   Problem: Nutrition: Goal: Adequate nutrition will be maintained Outcome: Completed/Met   Problem: Coping: Goal: Level of anxiety will decrease Outcome: Completed/Met   Problem: Elimination: Goal: Will not experience complications related to bowel motility Outcome: Completed/Met Goal: Will not experience complications related to urinary retention Outcome: Completed/Met   Problem: Pain Managment: Goal: General experience of comfort will improve and/or be controlled Outcome: Completed/Met   Problem: Safety: Goal: Ability to remain free from injury will improve Outcome: Completed/Met   Problem: Skin Integrity: Goal: Risk for impaired skin integrity will decrease Outcome: Completed/Met   Problem: Education: Goal: Knowledge of the prescribed therapeutic regimen will improve Outcome: Completed/Met Goal: Individualized Educational Video(s) Outcome: Completed/Met   Problem: Activity: Goal: Ability to avoid complications of mobility impairment will improve Outcome: Completed/Met Goal: Range of joint motion will improve Outcome:  Completed/Met   Problem: Clinical Measurements: Goal: Postoperative complications will be avoided or minimized Outcome: Completed/Met   Problem: Pain Management: Goal: Pain level will decrease with appropriate interventions Outcome: Completed/Met   Problem: Skin Integrity: Goal: Will show signs of wound healing Outcome: Completed/Met

## 2023-10-11 NOTE — Progress Notes (Signed)
 Patient alert and oriented, mae's well, voiding adequate amount of urine, swallowing without difficulty, no c/o pain at time of discharge. Patient discharged home with family. Script and discharged instructions given to patient. Patient and family stated understanding of instructions given. Room was checked and accounted for all patient's belongings; discharge instructions concerning his medications, incision care, follow up appointment and when to call the doctor as needed were all discussed with patient by RN and she expressed understanding on the instructions given

## 2023-10-11 NOTE — Anesthesia Postprocedure Evaluation (Signed)
 Anesthesia Post Note  Patient: Alisha Reed  Procedure(s) Performed: ARTHROPLASTY, KNEE, UNICOMPARTMENTAL (Right: Knee)     Patient location during evaluation: Nursing Unit Anesthesia Type: Regional and Spinal Level of consciousness: oriented and awake and alert Pain management: pain level controlled Vital Signs Assessment: post-procedure vital signs reviewed and stable Respiratory status: spontaneous breathing and respiratory function stable Cardiovascular status: blood pressure returned to baseline and stable Postop Assessment: no headache, no backache, no apparent nausea or vomiting and able to ambulate Anesthetic complications: no  No notable events documented.  Last Vitals:  Vitals:   10/11/23 1045 10/11/23 1113  BP: 124/74 133/63  Pulse: 65 70  Resp: 14 18  Temp: 36.5 C   SpO2: 98% 100%    Last Pain:  Vitals:   10/11/23 1045  TempSrc:   PainSc: 0-No pain                 Rosalita Combe

## 2023-10-12 ENCOUNTER — Other Ambulatory Visit: Payer: Self-pay | Admitting: Orthopaedic Surgery

## 2023-10-12 DIAGNOSIS — M1711 Unilateral primary osteoarthritis, right knee: Secondary | ICD-10-CM | POA: Diagnosis not present

## 2023-10-12 DIAGNOSIS — Z96651 Presence of right artificial knee joint: Secondary | ICD-10-CM

## 2023-10-13 ENCOUNTER — Encounter (HOSPITAL_COMMUNITY): Payer: Self-pay | Admitting: Orthopaedic Surgery

## 2023-10-13 ENCOUNTER — Other Ambulatory Visit: Payer: Self-pay | Admitting: Physician Assistant

## 2023-10-13 MED ORDER — HYDROMORPHONE HCL 2 MG PO TABS: 2.0000 mg | ORAL_TABLET | Freq: Three times a day (TID) | ORAL | 0 refills | Status: DC | PRN

## 2023-10-13 MED ORDER — OXYCODONE-ACETAMINOPHEN 5-325 MG PO TABS: 1.0000 | ORAL_TABLET | Freq: Four times a day (QID) | ORAL | 0 refills | Status: DC | PRN

## 2023-10-14 ENCOUNTER — Other Ambulatory Visit: Payer: Self-pay

## 2023-10-14 ENCOUNTER — Ambulatory Visit (HOSPITAL_COMMUNITY): Attending: Orthopaedic Surgery

## 2023-10-14 ENCOUNTER — Other Ambulatory Visit: Payer: Self-pay | Admitting: Physician Assistant

## 2023-10-14 DIAGNOSIS — M25561 Pain in right knee: Secondary | ICD-10-CM | POA: Diagnosis not present

## 2023-10-14 DIAGNOSIS — Z96651 Presence of right artificial knee joint: Secondary | ICD-10-CM | POA: Insufficient documentation

## 2023-10-14 DIAGNOSIS — M1711 Unilateral primary osteoarthritis, right knee: Secondary | ICD-10-CM | POA: Diagnosis not present

## 2023-10-14 DIAGNOSIS — M25661 Stiffness of right knee, not elsewhere classified: Secondary | ICD-10-CM

## 2023-10-14 DIAGNOSIS — R262 Difficulty in walking, not elsewhere classified: Secondary | ICD-10-CM | POA: Diagnosis not present

## 2023-10-14 MED ORDER — HYDROMORPHONE HCL 2 MG PO TABS: 2.0000 mg | ORAL_TABLET | Freq: Three times a day (TID) | ORAL | 0 refills | Status: DC | PRN

## 2023-10-14 NOTE — Therapy (Signed)
 OUTPATIENT PHYSICAL THERAPY LOWER EXTREMITY EVALUATION   Patient Name: Alisha Reed MRN: [id number] DOB:30-May-1960, 63 y.o., female Today's Date: 10/15/2023  END OF SESSION:  PT End of Session - 10/14/23 1300     Visit Number 1    Number of Visits 12    Date for PT Re-Evaluation 11/26/23    Authorization Type [place] [employer]    PT Start Time 1301    PT Stop Time 1341    PT Time Calculation (min) 40 min    Activity Tolerance Patient tolerated treatment well    Behavior During Therapy [facility] for tasks assessed/performed             Past Medical History:  Diagnosis Date   Arthritis    hands,    Carpal tunnel syndrome on both sides    both hands surgeries 01/2019   GERD (gastroesophageal reflux disease)    Hypercholesterolemia    Hypothyroidism    Osteopenia 11/08/2018   Thyroid  disease    Past Surgical History:  Procedure Laterality Date   BILATERAL CARPAL TUNNEL RELEASE Bilateral 01/18/2019   Procedure: BILATERAL CARPAL TUNNEL RELEASE;  Surgeon: Wes Hamman, MD;  Location: [facility];  Service: Orthopedics;  Laterality: Bilateral;   BLADDER SUSPENSION N/A 08/10/2022   Procedure: TRANSVAGINAL TAPE (TVT) PROCEDURE;  Surgeon: Arma Lamp, MD;  Location: [facility];  Service: Gynecology;  Laterality: N/A;  Total time requested is 1 hour.   CHOLECYSTECTOMY     COLONOSCOPY  2011   CYSTOSCOPY N/A 08/10/2022   Procedure: CYSTOSCOPY;  Surgeon: Arma Lamp, MD;  Location: [facility];  Service: Gynecology;  Laterality: N/A;   FOOT SURGERY Bilateral    HYSTERECTOMY ABDOMINAL WITH SALPINGECTOMY     NASAL SINUS SURGERY     PARTIAL KNEE ARTHROPLASTY Right 10/11/2023   Procedure: ARTHROPLASTY, KNEE, UNICOMPARTMENTAL;  Surgeon: Wes Hamman, MD;  Location: [facility];  Service: Orthopedics;  Laterality: Right;  RIGHT KNEE MEDIAL UKA vs TKA   Patient Active Problem List   Diagnosis Date Noted   Status post  right partial knee replacement 10/11/2023   Primary osteoarthritis of right knee 06/29/2023   Right tennis elbow 06/29/2023   Pain in right elbow 01/26/2023   Coronary artery calcification seen on CT scan 01/09/2023   Allergic rhinitis 12/09/2022   Microhematuria 12/09/2022   Rectal bleeding 03/13/2022   Other constipation 03/13/2022   Arthritis 12/03/2021   Stress incontinence 11/19/2020   S/p bilateral carpal tunnel release 03/28/2019   Chronic pain of right knee 12/29/2018   Right carpal tunnel syndrome 12/29/2018   Left carpal tunnel syndrome 12/29/2018   Osteopenia 11/08/2018   Pre-diabetes 05/02/2018   Acute pain of right shoulder 03/18/2018   Hyperlipidemia 11/04/2017   Vitamin D  deficiency 11/04/2017   S/P cholecystectomy 08/02/2017   Skier's thumb, right, subsequent encounter 03/15/2017   Chronic left shoulder pain 02/08/2017   Encounter for well adult exam with abnormal findings 09/24/2016   Hypothyroidism 09/24/2016    PCP: Rosalia Colonel, MD  REFERRING PROVIDER: Wes Hamman, MD  REFERRING DIAG: [phone] (ICD-10-CM) - Status post right partial knee replacement  THERAPY DIAG:  Acute pain of right knee - Plan: PT plan of care cert/re-cert  Stiffness of right knee, not elsewhere classified - Plan: PT plan of care cert/re-cert  Difficulty in walking, not elsewhere classified - Plan: PT plan of care cert/re-cert  Primary osteoarthritis of right knee - Plan: PT plan of care  cert/re-cert  Rationale for Evaluation and Treatment: Rehabilitation  ONSET DATE: 10/11/23  SUBJECTIVE:   SUBJECTIVE STATEMENT: Goes by [name]"; knee pain for a while; had injections for several years but then stopped helping her.  She saw Dr. Christiane Cowing; had surgery 10/11/23; has CPM machine 6 hours a day.    PERTINENT HISTORY: OR nurse PAIN:  Are you having pain? Yes: NPRS scale: 8/10 Pain location: right knee; and down back of calf Pain description: sore, tight, stabbing and aching Aggravating  factors: movement Relieving factors: oxycodone  with acetaminophen , "ice man"  PRECAUTIONS: None  RED FLAGS: None   WEIGHT BEARING RESTRICTIONS: No  FALLS:  Has patient fallen in last 6 months? No  LIVING ENVIRONMENT: Lives with: lives with their spouse Lives in: House/apartment Stairs: Yes: Internal: 14 steps; on left going up and External: 2 steps; on right going up, on left going up, and bilateral but cannot reach both Has following equipment at home: Otho Blitz - 2 wheeled  OCCUPATION: OR nurse  PLOF: Independent  PATIENT GOALS: go back to work; going to [employer] in 5 weeks[place]  NEXT MD VISIT: 10/26/23  OBJECTIVE:  Note: Objective measures were completed at Evaluation unless otherwise noted.  DIAGNOSTIC FINDINGS:   PATIENT SURVEYS:  LEFS 13/80 Extreme difficulty/unable (0), Quite a bit of difficulty (1), Moderate difficulty (2), Little difficulty (3), No difficulty (4) Survey date:    Any of your usual work, housework or school activities   2. Usual hobbies, recreational or sporting activities   3. Getting into/out of the bath   4. Walking between rooms   5. Putting on socks/shoes   6. Squatting    7. Lifting an object, like a bag of groceries from the floor   8. Performing light activities around your home   9. Performing heavy activities around your home   10. Getting into/out of a car   11. Walking 2 blocks   12. Walking 1 mile   13. Going up/down 10 stairs (1 flight)   14. Standing for 1 hour   15.  sitting for 1 hour   16. Running on even ground   17. Running on uneven ground   18. Making sharp turns while running fast   19. Hopping    20. Rolling over in bed   Score total:  13/80     COGNITION: Overall cognitive status: Within functional limits for tasks assessed     SENSATION: Some numbness outside of lower leg  EDEMA:  Normal for this time s/p partial TKA; bandage in place   POSTURE: rounded shoulders and forward  head  PALPATION: General soreness and redness  LOWER EXTREMITY ROM:  Active ROM Right eval Left eval  Hip flexion    Hip extension    Hip abduction    Hip adduction    Hip internal rotation    Hip external rotation    Knee flexion 76 AAROM   Knee extension -8   Ankle dorsiflexion    Ankle plantarflexion    Ankle inversion    Ankle eversion     (Blank rows = not tested)  LOWER EXTREMITY MMT:  MMT Right eval Left eval  Hip flexion    Hip extension    Hip abduction    Hip adduction    Hip internal rotation    Hip external rotation    Knee flexion    Knee extension Poor quad set   Ankle dorsiflexion    Ankle plantarflexion    Ankle  inversion    Ankle eversion     (Blank rows = not tested)   FUNCTIONAL TESTS:  5 times sit to stand: 41.01 2 minute walk test: 75 ft with RW  GAIT: Distance walked: 75 ft in clinic Assistive device utilized: Walker - 2 wheeled Level of assistance: SBA Comments: antalgic gait                                                                                                                                TREATMENT DATE: 10/14/23 physical therapy evaluation and HEP instruction    PATIENT EDUCATION:  Education details: Patient educated on exam findings, POC, scope of PT, HEP, and what to expect in the next few visits. Person educated: Patient Education method: Explanation, Demonstration, and Handouts Education comprehension: verbalized understanding, returned demonstration, verbal cues required, and tactile cues required  HOME EXERCISE PROGRAM: Access Code: [email or web address] URL: [email or web address] Date: 10/14/2023 Prepared by: [facility] - Rehab  Exercises - Supine Ankle Pumps  - 1 x daily - 7 x weekly - 1 sets - 15 reps - Supine Quadricep Sets  - 1 x daily - 7 x weekly - 1 sets - 15 reps - Supine Short Arc Quad  - 1 x daily - 7 x weekly - 1 sets - 15 reps - Supine Heel Slides  - 1 x daily - 7 x weekly - 1 sets - 15 reps -  Seated Long Arc Quad  - 1 x daily - 7 x weekly - 1 sets - 15 reps - Seated Knee Flexion Stretch  - 1 x daily - 7 x weekly - 1 sets - 15 reps - Seated Heel Toe Raises  - 1 x daily - 7 x weekly - 1 sets - 15 reps - Supine Heel Slide with Strap  - 1 x daily - 7 x weekly - 1 sets - 10 reps - Long Sitting Calf Stretch with Strap  - 1 x daily - 7 x weekly - 1 sets - 5 reps - 20 sec hold  ASSESSMENT:  CLINICAL IMPRESSION: Patient is a 63 y.o. female who was seen today for physical therapy evaluation and treatment for Z96.651 (ICD-10-CM) - Status post right partial knee replacement. Patient demonstrates muscle weakness, reduced ROM, and fascial restrictions which are likely contributing to symptoms of pain and are negatively impacting patient ability to perform ADLs and functional mobility tasks. Patient will benefit from skilled physical therapy services to address these deficits to reduce pain and improve level of function with ADLs and functional mobility tasks.   OBJECTIVE IMPAIRMENTS: Abnormal gait, decreased activity tolerance, decreased endurance, decreased mobility, difficulty walking, decreased ROM, decreased strength, increased fascial restrictions, impaired perceived functional ability, and pain.   ACTIVITY LIMITATIONS: carrying, lifting, bending, sitting, standing, squatting, sleeping, stairs, transfers, bed mobility, bathing, toileting, dressing, locomotion level, and caring for others  PARTICIPATION LIMITATIONS: meal prep, cleaning, laundry, driving, shopping, community activity, and occupation    REHAB  POTENTIAL: Good  CLINICAL DECISION MAKING: Evolving/moderate complexity  EVALUATION COMPLEXITY: Moderate   GOALS: Goals reviewed with patient? No  SHORT TERM GOALS: Target date: 11/04/2023 patient will be independent with initial HEP  Baseline: Goal status: INITIAL  2.  Patient will report 30% improvement overall  Baseline:  Goal status: INITIAL  3.  Patient will  increase right knee mobility to -5 to 90 to promote normal navigation of steps; step over step pattern  Baseline: see above Goal status: INITIAL   LONG TERM GOALS: Target date: 12/06/2023  Patient will be independent in self management strategies to improve quality of life and functional outcomes.  Baseline:  Goal status: INITIAL  2.  Patient will report 50% improvement overall  Baseline:  Goal status: INITIAL  3.  Patient will increase right knee mobility to -2 to 120 to promote normal navigation of steps; step over step pattern  Baseline:  Goal status: INITIAL  4.  Patient will improve LEFS score by 27 points to demonstrate improved perceived function  Baseline: 13/80 Goal status: INITIAL  5.  Patient will increase distance on 2 MWT by 100 ft with LRAD to demonstrate improved functional gait in community  Baseline: 75 ft with RW Goal status: INITIAL  6.   Patient will increase right leg MMT's to 5/5 to allow navigation of steps without gait deviation or loss of balance  Baseline:  Goal status: INITIAL   PLAN:  PT FREQUENCY: 2x/week  PT DURATION: 6 weeks  PLANNED INTERVENTIONS: 97164- PT Re-evaluation, 97110-Therapeutic exercises, 97530- Therapeutic activity, 97112- Neuromuscular re-education, 97535- Self Care, 13086- Manual therapy, [id number]- Gait training, [phone]- Orthotic Fit/training, [phone]- Canalith repositioning, [id number]- Aquatic Therapy, 97760- Splinting, [phone]- Wound care (first 20 sq cm), 97598- Wound care (each additional 20 sq cm)Patient/Family education, Balance training, Stair training, Taping, Dry Needling, Joint mobilization, Joint manipulation, Spinal manipulation, Spinal mobilization, Scar mobilization, and DME instructions.   PLAN FOR NEXT SESSION: Review HEP and goals; right knee mobility and strength; gait training; manual when appropriate  10:00 AM, 10/15/23 Giordan Fordham Small Monserat Prestigiacomo MPT [facility] [place] [phone] [phone]

## 2023-10-18 ENCOUNTER — Other Ambulatory Visit: Payer: Self-pay | Admitting: Physician Assistant

## 2023-10-18 MED ORDER — METHOCARBAMOL 750 MG PO TABS: 750.0000 mg | ORAL_TABLET | Freq: Three times a day (TID) | ORAL | 2 refills | Status: DC | PRN

## 2023-10-18 MED ORDER — OXYCODONE-ACETAMINOPHEN 5-325 MG PO TABS: 1.0000 | ORAL_TABLET | Freq: Four times a day (QID) | ORAL | 0 refills | Status: DC | PRN

## 2023-10-19 ENCOUNTER — Ambulatory Visit (HOSPITAL_COMMUNITY): Admitting: Physical Therapy

## 2023-10-19 DIAGNOSIS — M25561 Pain in right knee: Secondary | ICD-10-CM

## 2023-10-19 DIAGNOSIS — R262 Difficulty in walking, not elsewhere classified: Secondary | ICD-10-CM

## 2023-10-19 DIAGNOSIS — M25661 Stiffness of right knee, not elsewhere classified: Secondary | ICD-10-CM

## 2023-10-19 DIAGNOSIS — M1711 Unilateral primary osteoarthritis, right knee: Secondary | ICD-10-CM

## 2023-10-19 DIAGNOSIS — Z96651 Presence of right artificial knee joint: Secondary | ICD-10-CM | POA: Diagnosis not present

## 2023-10-19 NOTE — Therapy (Signed)
 OUTPATIENT PHYSICAL THERAPY LOWER EXTREMITY TREATMENT Patient Name: Alisha Reed MRN: [id number] DOB:1960/09/07, 63 y.o., female Today's Date: 10/19/2023  END OF SESSION:  PT End of Session - 10/19/23 1209     Visit Number 2    Number of Visits 12    Date for PT Re-Evaluation 11/26/23    Authorization Type [place] Employee [employer]    PT Start Time [phone]    PT Stop Time 0930    PT Time Calculation (min) 42 min    Activity Tolerance Patient tolerated treatment well    Behavior During Therapy [facility] for tasks assessed/performed              Past Medical History:  Diagnosis Date   Arthritis    hands,    Carpal tunnel syndrome on both sides    both hands surgeries 01/2019   GERD (gastroesophageal reflux disease)    Hypercholesterolemia    Hypothyroidism    Osteopenia 11/08/2018   Thyroid  disease    Past Surgical History:  Procedure Laterality Date   BILATERAL CARPAL TUNNEL RELEASE Bilateral 01/18/2019   Procedure: BILATERAL CARPAL TUNNEL RELEASE;  Surgeon: Wes Hamman, MD;  Location: [facility];  Service: Orthopedics;  Laterality: Bilateral;   BLADDER SUSPENSION N/A 08/10/2022   Procedure: TRANSVAGINAL TAPE (TVT) PROCEDURE;  Surgeon: Arma Lamp, MD;  Location: [facility];  Service: Gynecology;  Laterality: N/A;  Total time requested is 1 hour.   CHOLECYSTECTOMY     COLONOSCOPY  2011   CYSTOSCOPY N/A 08/10/2022   Procedure: CYSTOSCOPY;  Surgeon: Arma Lamp, MD;  Location: [facility];  Service: Gynecology;  Laterality: N/A;   FOOT SURGERY Bilateral    HYSTERECTOMY ABDOMINAL WITH SALPINGECTOMY     NASAL SINUS SURGERY     PARTIAL KNEE ARTHROPLASTY Right 10/11/2023   Procedure: ARTHROPLASTY, KNEE, UNICOMPARTMENTAL;  Surgeon: Wes Hamman, MD;  Location: [facility];  Service: Orthopedics;  Laterality: Right;  RIGHT KNEE MEDIAL UKA vs TKA   Patient Active Problem List   Diagnosis Date Noted   Status post  right partial knee replacement 10/11/2023   Primary osteoarthritis of right knee 06/29/2023   Right tennis elbow 06/29/2023   Pain in right elbow 01/26/2023   Coronary artery calcification seen on CT scan 01/09/2023   Allergic rhinitis 12/09/2022   Microhematuria 12/09/2022   Rectal bleeding 03/13/2022   Other constipation 03/13/2022   Arthritis 12/03/2021   Stress incontinence 11/19/2020   S/p bilateral carpal tunnel release 03/28/2019   Chronic pain of right knee 12/29/2018   Right carpal tunnel syndrome 12/29/2018   Left carpal tunnel syndrome 12/29/2018   Osteopenia 11/08/2018   Pre-diabetes 05/02/2018   Acute pain of right shoulder 03/18/2018   Hyperlipidemia 11/04/2017   Vitamin D  deficiency 11/04/2017   S/P cholecystectomy 08/02/2017   Skier's thumb, right, subsequent encounter 03/15/2017   Chronic left shoulder pain 02/08/2017   Encounter for well adult exam with abnormal findings 09/24/2016   Hypothyroidism 09/24/2016    PCP: Rosalia Colonel, MD  REFERRING PROVIDER: Wes Hamman, MD  REFERRING DIAG: [phone] (ICD-10-CM) - Status post right partial knee replacement  THERAPY DIAG:  No diagnosis found.  Rationale for Evaluation and Treatment: Rehabilitation  ONSET DATE: 10/11/23  SUBJECTIVE:   SUBJECTIVE STATEMENT: Pt states she is doing well and wants to use the cane so she can walk upstairs to the bathtub.  8/10 pain reported today in her Rt knee.  Evaluation:  Goes  by [name]"; knee pain for a while; had injections for several years but then stopped helping her.  She saw Dr. Christiane Cowing; had surgery 10/11/23; has CPM machine 6 hours a day.    PERTINENT HISTORY: OR nurse PAIN:  Are you having pain? Yes: NPRS scale: 8/10 Pain location: right knee; and down back of calf Pain description: sore, tight, stabbing and aching Aggravating factors: movement Relieving factors: oxycodone  with acetaminophen , "ice man"  PRECAUTIONS: None  RED FLAGS: None   WEIGHT BEARING  RESTRICTIONS: No  FALLS:  Has patient fallen in last 6 months? No  LIVING ENVIRONMENT: Lives with: lives with their spouse Lives in: House/apartment Stairs: Yes: Internal: 14 steps; on left going up and External: 2 steps; on right going up, on left going up, and bilateral but cannot reach both Has following equipment at home: Otho Blitz - 2 wheeled  OCCUPATION: OR nurse  PLOF: Independent  PATIENT GOALS: go back to work; going to [employer] in 5 weeks; [place]  NEXT MD VISIT: 10/26/23  OBJECTIVE:  Note: Objective measures were completed at Evaluation unless otherwise noted.  DIAGNOSTIC FINDINGS:   PATIENT SURVEYS:  LEFS 13/80 Extreme difficulty/unable (0), Quite a bit of difficulty (1), Moderate difficulty (2), Little difficulty (3), No difficulty (4) Survey date:    Any of your usual work, housework or school activities   2. Usual hobbies, recreational or sporting activities   3. Getting into/out of the bath   4. Walking between rooms   5. Putting on socks/shoes   6. Squatting    7. Lifting an object, like a bag of groceries from the floor   8. Performing light activities around your home   9. Performing heavy activities around your home   10. Getting into/out of a car   11. Walking 2 blocks   12. Walking 1 mile   13. Going up/down 10 stairs (1 flight)   14. Standing for 1 hour   15.  sitting for 1 hour   16. Running on even ground   17. Running on uneven ground   18. Making sharp turns while running fast   19. Hopping    20. Rolling over in bed   Score total:  13/80     COGNITION: Overall cognitive status: Within functional limits for tasks assessed     SENSATION: Some numbness outside of lower leg  EDEMA:  Normal for this time s/p partial TKA; bandage in place   POSTURE: rounded shoulders and forward head  PALPATION: General soreness and redness  LOWER EXTREMITY ROM:  Active ROM Right eval Left eval  Hip flexion    Hip extension    Hip abduction     Hip adduction    Hip internal rotation    Hip external rotation    Knee flexion 76 AAROM   Knee extension -8   Ankle dorsiflexion    Ankle plantarflexion    Ankle inversion    Ankle eversion     (Blank rows = not tested)  LOWER EXTREMITY MMT:  MMT Right eval Left eval  Hip flexion    Hip extension    Hip abduction    Hip adduction    Hip internal rotation    Hip external rotation    Knee flexion    Knee extension Poor quad set   Ankle dorsiflexion    Ankle plantarflexion    Ankle inversion    Ankle eversion     (Blank rows = not tested)   FUNCTIONAL TESTS:  5 times sit to stand: 41.01 2 minute walk test: 75 ft with RW  GAIT: Distance walked: 75 ft in clinic Assistive device utilized: Walker - 2 wheeled Level of assistance: SBA Comments: antalgic gait                                                                                                                                TREATMENT DATE:  10/19/23 Bike seat 6  rocking 5 minutes Standing: knee flexion stretch 8" step 10X10"  Knee flexion 10X Gait with QC 100 feet Stair negotiation 7" with Lt HR and WBQC 2RT   10/14/23 physical therapy evaluation and HEP instruction    PATIENT EDUCATION:  Education details: Patient educated on exam findings, POC, scope of PT, HEP, and what to expect in the next few visits. Person educated: Patient Education method: Explanation, Demonstration, and Handouts Education comprehension: verbalized understanding, returned demonstration, verbal cues required, and tactile cues required  HOME EXERCISE PROGRAM: Access Code: [email or web address] URL: [email or web address] Date: 10/14/2023 Prepared by: [facility] - Rehab  Exercises - Supine Ankle Pumps  - 1 x daily - 7 x weekly - 1 sets - 15 reps - Supine Quadricep Sets  - 1 x daily - 7 x weekly - 1 sets - 15 reps - Supine Short Arc Quad  - 1 x daily - 7 x weekly - 1 sets - 15 reps - Supine Heel Slides  - 1 x daily - 7 x weekly - 1  sets - 15 reps - Seated Long Arc Quad  - 1 x daily - 7 x weekly - 1 sets - 15 reps - Seated Knee Flexion Stretch  - 1 x daily - 7 x weekly - 1 sets - 15 reps - Seated Heel Toe Raises  - 1 x daily - 7 x weekly - 1 sets - 15 reps - Supine Heel Slide with Strap  - 1 x daily - 7 x weekly - 1 sets - 10 reps - Long Sitting Calf Stretch with Strap  - 1 x daily - 7 x weekly - 1 sets - 5 reps - 20 sec hold  ASSESSMENT:  CLINICAL IMPRESSION: Goals reviewed. Began on bike for AROM and proceeded with standing stretches to improve flexion/extension.  Pt entered with RW, however interest in [facility] as she has this at home.  Pt gait trained with this and able to complete SBA with step through gait.  Completed 7" stairs using LT rail and QC (as at home) with ability to also complete this activity with SBA.  PT did complete in step-to gait pattern.  Pt with only mild discomfort during session today and no increased pain or issues with weight bearing activities. Therapist did not take ROM measurement this session.  Will continue to benefit from skilled therapy.    Evaluation: Patient is a 63 y.o. female who was seen today for physical therapy evaluation and treatment for Z96.651 (ICD-10-CM) - Status post  right partial knee replacement. Patient demonstrates muscle weakness, reduced ROM, and fascial restrictions which are likely contributing to symptoms of pain and are negatively impacting patient ability to perform ADLs and functional mobility tasks. Patient will benefit from skilled physical therapy services to address these deficits to reduce pain and improve level of function with ADLs and functional mobility tasks.   OBJECTIVE IMPAIRMENTS: Abnormal gait, decreased activity tolerance, decreased endurance, decreased mobility, difficulty walking, decreased ROM, decreased strength, increased fascial restrictions, impaired perceived functional ability, and pain.   ACTIVITY LIMITATIONS: carrying, lifting, bending, sitting,  standing, squatting, sleeping, stairs, transfers, bed mobility, bathing, toileting, dressing, locomotion level, and caring for others  PARTICIPATION LIMITATIONS: meal prep, cleaning, laundry, driving, shopping, community activity, and occupation    REHAB POTENTIAL: Good  CLINICAL DECISION MAKING: Evolving/moderate complexity  EVALUATION COMPLEXITY: Moderate   GOALS: Goals reviewed with patient? Yes  SHORT TERM GOALS: Target date: 11/04/2023 patient will be independent with initial HEP  Baseline: Goal status: INITIAL  2.  Patient will report 30% improvement overall  Baseline:  Goal status: INITIAL  3.  Patient will increase right knee mobility to -5 to 90 to promote normal navigation of steps; step over step pattern  Baseline: see above Goal status: INITIAL   LONG TERM GOALS: Target date: 12/06/2023  Patient will be independent in self management strategies to improve quality of life and functional outcomes.  Baseline:  Goal status: INITIAL  2.  Patient will report 50% improvement overall  Baseline:  Goal status: INITIAL  3.  Patient will increase right knee mobility to -2 to 120 to promote normal navigation of steps; step over step pattern  Baseline:  Goal status: INITIAL  4.  Patient will improve LEFS score by 27 points to demonstrate improved perceived function  Baseline: 13/80 Goal status: INITIAL  5.  Patient will increase distance on 2 MWT by 100 ft with LRAD to demonstrate improved functional gait in community  Baseline: 75 ft with RW Goal status: INITIAL  6.   Patient will increase right leg MMT's to 5/5 to allow navigation of steps without gait deviation or loss of balance  Baseline:  Goal status: INITIAL   PLAN:  PT FREQUENCY: 2x/week  PT DURATION: 6 weeks  PLANNED INTERVENTIONS: 97164- PT Re-evaluation, 97110-Therapeutic exercises, 97530- Therapeutic activity, 97112- Neuromuscular re-education, 97535- Self Care, 01027- Manual therapy,  [id number]- Gait training, [phone]- Orthotic Fit/training, [phone]- Canalith repositioning, [id number]- Aquatic Therapy, 97760- Splinting, [phone]- Wound care (first 20 sq cm), 97598- Wound care (each additional 20 sq cm)Patient/Family education, Balance training, Stair training, Taping, Dry Needling, Joint mobilization, Joint manipulation, Spinal manipulation, Spinal mobilization, Scar mobilization, and DME instructions.   PLAN FOR NEXT SESSION: continue to progress ROM, functional strength and gait with LRAD.  12:09 PM, 10/19/23 Lorenso Romance, PTA/CLT [facility] Ph: [phone]

## 2023-10-21 ENCOUNTER — Encounter (HOSPITAL_COMMUNITY): Payer: Self-pay

## 2023-10-21 ENCOUNTER — Ambulatory Visit (HOSPITAL_COMMUNITY)

## 2023-10-21 DIAGNOSIS — M25661 Stiffness of right knee, not elsewhere classified: Secondary | ICD-10-CM | POA: Diagnosis not present

## 2023-10-21 DIAGNOSIS — R262 Difficulty in walking, not elsewhere classified: Secondary | ICD-10-CM | POA: Diagnosis not present

## 2023-10-21 DIAGNOSIS — M25561 Pain in right knee: Secondary | ICD-10-CM | POA: Diagnosis not present

## 2023-10-21 DIAGNOSIS — M1711 Unilateral primary osteoarthritis, right knee: Secondary | ICD-10-CM | POA: Diagnosis not present

## 2023-10-21 DIAGNOSIS — Z96651 Presence of right artificial knee joint: Secondary | ICD-10-CM | POA: Diagnosis not present

## 2023-10-21 NOTE — Therapy (Signed)
 OUTPATIENT PHYSICAL THERAPY LOWER EXTREMITY TREATMENT Patient Name: Alisha Reed MRN: [id number] DOB:29-Jul-1960, 63 y.o., female Today's Date: 10/21/2023  END OF SESSION:  PT End of Session - 10/21/23 1148     Visit Number 3    Number of Visits 12    Date for PT Re-Evaluation 11/26/23    Authorization Type [place] [employer]    PT Start Time 1149    PT Stop Time 1238    PT Time Calculation (min) 49 min    Activity Tolerance Patient tolerated treatment well    Behavior During Therapy [facility] for tasks assessed/performed           Past Medical History:  Diagnosis Date   Arthritis    hands,    Carpal tunnel syndrome on both sides    both hands surgeries 01/2019   GERD (gastroesophageal reflux disease)    Hypercholesterolemia    Hypothyroidism    Osteopenia 11/08/2018   Thyroid  disease    Past Surgical History:  Procedure Laterality Date   BILATERAL CARPAL TUNNEL RELEASE Bilateral 01/18/2019   Procedure: BILATERAL CARPAL TUNNEL RELEASE;  Surgeon: Wes Hamman, MD;  Location:  SURGERY C[facility];  Service: Orthopedics;  Laterality: Bilateral;   BLADDER SUSPENSION N/A 08/10/2022   Procedure: TRANSVAGINAL TAPE (TVT) PROCEDURE;  Surgeon: Arma Lamp, MD;  Location: [facility];  Service: Gynecology;  Laterality: N/A;  Total time requested is 1 hour.   CHOLECYSTECTOMY     COLONOSCOPY  2011   CYSTOSCOPY N/A 08/10/2022   Procedure: CYSTOSCOPY;  Surgeon: Arma Lamp, MD;  Location: [facility];  Service: Gynecology;  Laterality: N/A;   FOOT SURGERY Bilateral    HYSTERECTOMY ABDOMINAL WITH SALPINGECTOMY     NASAL SINUS SURGERY     PARTIAL KNEE ARTHROPLASTY Right 10/11/2023   Procedure: ARTHROPLASTY, KNEE, UNICOMPARTMENTAL;  Surgeon: Wes Hamman, MD;  Location: [facility] OR;  Service: Orthopedics;  Laterality: Right;  RIGHT KNEE MEDIAL UKA vs TKA   Patient Active Problem List   Diagnosis Date Noted   Status post  right partial knee replacement 10/11/2023   Primary osteoarthritis of right knee 06/29/2023   Right tennis elbow 06/29/2023   Pain in right elbow 01/26/2023   Coronary artery calcification seen on CT scan 01/09/2023   Allergic rhinitis 12/09/2022   Microhematuria 12/09/2022   Rectal bleeding 03/13/2022   Other constipation 03/13/2022   Arthritis 12/03/2021   Stress incontinence 11/19/2020   S/p bilateral carpal tunnel release 03/28/2019   Chronic pain of right knee 12/29/2018   Right carpal tunnel syndrome 12/29/2018   Left carpal tunnel syndrome 12/29/2018   Osteopenia 11/08/2018   Pre-diabetes 05/02/2018   Acute pain of right shoulder 03/18/2018   Hyperlipidemia 11/04/2017   Vitamin D  deficiency 11/04/2017   S/P cholecystectomy 08/02/2017   Skier's thumb, right, subsequent encounter 03/15/2017   Chronic left shoulder pain 02/08/2017   Encounter for well adult exam with abnormal findings 09/24/2016   Hypothyroidism 09/24/2016    PCP: Rosalia Colonel, MD  REFERRING PROVIDER: Wes Hamman, MD  REFERRING DIAG: [phone] (ICD-10-CM) - Status post right partial knee replacement  THERAPY DIAG:  Acute pain of right knee  Stiffness of right knee, not elsewhere classified  Difficulty in walking, not elsewhere classified  Primary osteoarthritis of right knee  Rationale for Evaluation and Treatment: Rehabilitation  ONSET DATE: 10/11/23  SUBJECTIVE:   SUBJECTIVE STATEMENT: Pt stated she has high pain scale 8-9/10.  Arrived ambulating with [facility].  Has taken muscle relaxor and pain medication prior to session.  Reports she has been riding CPM for 4 hours, 6 hours is too much.  Reports she walked up 14 steps yesterday with use of handrail and took a shower in seated position.  Evaluation:  Goes by Alisha Reed; knee pain for a while; had injections for several years but then stopped helping her.  She saw Dr. Christiane Cowing; had surgery 10/11/23; has CPM machine 6 hours a day.    PERTINENT HISTORY: OR  nurse PAIN:  Are you having pain? Yes: NPRS scale: 8/10 Pain location: right knee; and down back of calf Pain description: sore, tight, stabbing and aching Aggravating factors: movement Relieving factors: oxycodone  with acetaminophen , ice man  PRECAUTIONS: None  RED FLAGS: None   WEIGHT BEARING RESTRICTIONS: No  FALLS:  Has patient fallen in last 6 months? No  LIVING ENVIRONMENT: Lives with: lives with their spouse Lives in: House/apartment Stairs: Yes: Internal: 14 steps; on left going up and External: 2 steps; on right going up, on left going up, and bilateral but cannot reach both Has following equipment at home: Otho Blitz - 2 wheeled  OCCUPATION: OR nurse  PLOF: Independent  PATIENT GOALS: go back to work; going to [employer] in 5 weeks[place]  NEXT MD VISIT: 10/26/23  OBJECTIVE:  Note: Objective measures were completed at Evaluation unless otherwise noted.  DIAGNOSTIC FINDINGS:   PATIENT SURVEYS:  LEFS 13/80 Extreme difficulty/unable (0), Quite a bit of difficulty (1), Moderate difficulty (2), Little difficulty (3), No difficulty (4) Survey date:    Any of your usual work, housework or school activities   2. Usual hobbies, recreational or sporting activities   3. Getting into/out of the bath   4. Walking between rooms   5. Putting on socks/shoes   6. Squatting    7. Lifting an object, like a bag of groceries from the floor   8. Performing light activities around your home   9. Performing heavy activities around your home   10. Getting into/out of a car   11. Walking 2 blocks   12. Walking 1 mile   13. Going up/down 10 stairs (1 flight)   14. Standing for 1 hour   15.  sitting for 1 hour   16. Running on even ground   17. Running on uneven ground   18. Making sharp turns while running fast   19. Hopping    20. Rolling over in bed   Score total:  13/80     COGNITION: Overall cognitive status: Within functional limits for tasks  assessed     SENSATION: Some numbness outside of lower leg  EDEMA:  Normal for this time s/p partial TKA; bandage in place   POSTURE: rounded shoulders and forward head  PALPATION: General soreness and redness  LOWER EXTREMITY ROM:  Active ROM Right eval Left eval Right 10/21/23  Hip flexion     Hip extension     Hip abduction     Hip adduction     Hip internal rotation     Hip external rotation     Knee flexion 76 AAROM  88 AAROM with rope  Knee extension -8  5 lacking from neutral  Ankle dorsiflexion     Ankle plantarflexion     Ankle inversion     Ankle eversion      (Blank rows = not tested)  LOWER EXTREMITY MMT:  MMT Right eval Left eval  Hip flexion    Hip  extension    Hip abduction    Hip adduction    Hip internal rotation    Hip external rotation    Knee flexion    Knee extension Poor quad set   Ankle dorsiflexion    Ankle plantarflexion    Ankle inversion    Ankle eversion     (Blank rows = not tested)   FUNCTIONAL TESTS:  5 times sit to stand: 41.01 2 minute walk test: 75 ft with RW  GAIT: Distance walked: 75 ft in clinic Assistive device utilized: Walker - 2 wheeled Level of assistance: SBA Comments: antalgic gait                                                                                                                                TREATMENT DATE:  10/21/23: Therapist adjusted LBQC, cueing for heel strike and toe push off   Supine: Quad set 10x SAQ 5x 5 Heel slide with rope AAROM AROM 5-88 degrees Seated:  LAQ 5x  Heel slides 10x Heel and toe raises Long sit calf strech 2x 30 Bike rocking seat 9 x 5'  10/19/23 Bike seat 6  rocking 5 minutes Standing: knee flexion stretch 8 step 10X10  Knee flexion 10X Gait with QC 100 feet Stair negotiation 7 with Lt HR and WBQC 2RT   10/14/23 physical therapy evaluation and HEP instruction    PATIENT EDUCATION:  Education details: Patient educated on exam findings, POC,  scope of PT, HEP, and what to expect in the next few visits. Person educated: Patient Education method: Explanation, Demonstration, and Handouts Education comprehension: verbalized understanding, returned demonstration, verbal cues required, and tactile cues required  HOME EXERCISE PROGRAM: Access Code: [email or web address] URL: [email or web address] Date: 10/14/2023 Prepared by: [facility] - Rehab  Exercises - Supine Ankle Pumps  - 1 x daily - 7 x weekly - 1 sets - 15 reps - Supine Quadricep Sets  - 1 x daily - 7 x weekly - 1 sets - 15 reps - Supine Short Arc Quad  - 1 x daily - 7 x weekly - 1 sets - 15 reps - Supine Heel Slides  - 1 x daily - 7 x weekly - 1 sets - 15 reps - Seated Long Arc Quad  - 1 x daily - 7 x weekly - 1 sets - 15 reps - Seated Knee Flexion Stretch  - 1 x daily - 7 x weekly - 1 sets - 15 reps - Seated Heel Toe Raises  - 1 x daily - 7 x weekly - 1 sets - 15 reps - Supine Heel Slide with Strap  - 1 x daily - 7 x weekly - 1 sets - 10 reps - Long Sitting Calf Stretch with Strap  - 1 x daily - 7 x weekly - 1 sets - 5 reps - 20 sec hold  ASSESSMENT:  CLINICAL IMPRESSION: Pt 9 days post-op TKR.  Pt stated she has mainly been  focusing on CPM for 4 hours a day and has been ambulating with [facility] around the house.  Educated pt importance of HEP compliance for maximal benefits, reviewed current exercise program with AAROM with use of rope to assist with knee flexion based exercises.  AROM 5-88 degrees.  EOS on bike rocking, pt reports pain reduced with therex and bike.  Reviewed RICE techniques for healing and pain with verbalized understanding.    Evaluation: Patient is a 63 y.o. female who was seen today for physical therapy evaluation and treatment for Z96.651 (ICD-10-CM) - Status post right partial knee replacement. Patient demonstrates muscle weakness, reduced ROM, and fascial restrictions which are likely contributing to symptoms of pain and are negatively impacting patient  ability to perform ADLs and functional mobility tasks. Patient will benefit from skilled physical therapy services to address these deficits to reduce pain and improve level of function with ADLs and functional mobility tasks.   OBJECTIVE IMPAIRMENTS: Abnormal gait, decreased activity tolerance, decreased endurance, decreased mobility, difficulty walking, decreased ROM, decreased strength, increased fascial restrictions, impaired perceived functional ability, and pain.   ACTIVITY LIMITATIONS: carrying, lifting, bending, sitting, standing, squatting, sleeping, stairs, transfers, bed mobility, bathing, toileting, dressing, locomotion level, and caring for others  PARTICIPATION LIMITATIONS: meal prep, cleaning, laundry, driving, shopping, community activity, and occupation    REHAB POTENTIAL: Good  CLINICAL DECISION MAKING: Evolving/moderate complexity  EVALUATION COMPLEXITY: Moderate   GOALS: Goals reviewed with patient? Yes  SHORT TERM GOALS: Target date: 11/04/2023 patient will be independent with initial HEP  Baseline: Goal status: INITIAL  2.  Patient will report 30% improvement overall  Baseline:  Goal status: INITIAL  3.  Patient will increase right knee mobility to -5 to 90 to promote normal navigation of steps; step over step pattern  Baseline: see above Goal status: INITIAL   LONG TERM GOALS: Target date: 12/06/2023  Patient will be independent in self management strategies to improve quality of life and functional outcomes.  Baseline:  Goal status: INITIAL  2.  Patient will report 50% improvement overall  Baseline:  Goal status: INITIAL  3.  Patient will increase right knee mobility to -2 to 120 to promote normal navigation of steps; step over step pattern  Baseline:  Goal status: INITIAL  4.  Patient will improve LEFS score by 27 points to demonstrate improved perceived function  Baseline: 13/80 Goal status: INITIAL  5.  Patient will increase  distance on 2 MWT by 100 ft with LRAD to demonstrate improved functional gait in community  Baseline: 75 ft with RW Goal status: INITIAL  6.   Patient will increase right leg MMT's to 5/5 to allow navigation of steps without gait deviation or loss of balance  Baseline:  Goal status: INITIAL   PLAN:  PT FREQUENCY: 2x/week  PT DURATION: 6 weeks  PLANNED INTERVENTIONS: 97164- PT Re-evaluation, 97110-Therapeutic exercises, 97530- Therapeutic activity, 97112- Neuromuscular re-education, 97535- Self Care, 40981- Manual therapy, [id number]- Gait training, [phone]- Orthotic Fit/training, [phone]- Canalith repositioning, [id number]- Aquatic Therapy, 97760- Splinting, [phone]- Wound care (first 20 sq cm), 97598- Wound care (each additional 20 sq cm)Patient/Family education, Balance training, Stair training, Taping, Dry Needling, Joint mobilization, Joint manipulation, Spinal manipulation, Spinal mobilization, Scar mobilization, and DME instructions.   PLAN FOR NEXT SESSION: continue to progress ROM, functional strength and gait with LRAD.  Minor Amble, LPTA/CLT; CBIS [phone]  12:43 PM, 10/21/23

## 2023-10-23 ENCOUNTER — Other Ambulatory Visit: Payer: Self-pay | Admitting: Physician Assistant

## 2023-10-23 MED ORDER — OXYCODONE-ACETAMINOPHEN 5-325 MG PO TABS: 1.0000 | ORAL_TABLET | Freq: Four times a day (QID) | ORAL | 0 refills | Status: DC | PRN

## 2023-10-25 ENCOUNTER — Ambulatory Visit (HOSPITAL_COMMUNITY)

## 2023-10-25 ENCOUNTER — Encounter (HOSPITAL_COMMUNITY): Payer: Self-pay

## 2023-10-25 DIAGNOSIS — R262 Difficulty in walking, not elsewhere classified: Secondary | ICD-10-CM | POA: Diagnosis not present

## 2023-10-25 DIAGNOSIS — M1711 Unilateral primary osteoarthritis, right knee: Secondary | ICD-10-CM | POA: Diagnosis not present

## 2023-10-25 DIAGNOSIS — M25561 Pain in right knee: Secondary | ICD-10-CM

## 2023-10-25 DIAGNOSIS — Z96651 Presence of right artificial knee joint: Secondary | ICD-10-CM | POA: Diagnosis not present

## 2023-10-25 DIAGNOSIS — M25661 Stiffness of right knee, not elsewhere classified: Secondary | ICD-10-CM | POA: Diagnosis not present

## 2023-10-25 NOTE — Therapy (Signed)
 OUTPATIENT PHYSICAL THERAPY LOWER EXTREMITY TREATMENT Patient Name: Alisha Reed MRN: [id number] DOB:05/18/1960, 64 y.o., female Today's Date: 10/25/2023  END OF SESSION:  PT End of Session - 10/25/23 1018     Visit Number 4    Number of Visits 12    Date for PT Re-Evaluation 11/26/23    Authorization Type [employer]    PT Start Time 1020    PT Stop Time 1100    PT Time Calculation (min) 40 min    Activity Tolerance Patient tolerated treatment well    Behavior During Therapy [facility] for tasks assessed/performed           Past Medical History:  Diagnosis Date   Arthritis    hands,    Carpal tunnel syndrome on both sides    both hands surgeries 01/2019   GERD (gastroesophageal reflux disease)    Hypercholesterolemia    Hypothyroidism    Osteopenia 11/08/2018   Thyroid  disease    Past Surgical History:  Procedure Laterality Date   BILATERAL CARPAL TUNNEL RELEASE Bilateral 01/18/2019   Procedure: BILATERAL CARPAL TUNNEL RELEASE;  Surgeon: Wes Hamman, MD;  Location: [facility];  Service: Orthopedics;  Laterality: Bilateral;   BLADDER SUSPENSION N/A 08/10/2022   Procedure: TRANSVAGINAL TAPE (TVT) PROCEDURE;  Surgeon: Arma Lamp, MD;  Location: [facility];  Service: Gynecology;  Laterality: N/A;  Total time requested is 1 hour.   CHOLECYSTECTOMY     COLONOSCOPY  2011   CYSTOSCOPY N/A 08/10/2022   Procedure: CYSTOSCOPY;  Surgeon: Arma Lamp, MD;  Location: [facility];  Service: Gynecology;  Laterality: N/A;   FOOT SURGERY Bilateral    HYSTERECTOMY ABDOMINAL WITH SALPINGECTOMY     NASAL SINUS SURGERY     PARTIAL KNEE ARTHROPLASTY Right 10/11/2023   Procedure: ARTHROPLASTY, KNEE, UNICOMPARTMENTAL;  Surgeon: Wes Hamman, MD;  Location: [facility];  Service: Orthopedics;  Laterality: Right;  RIGHT KNEE MEDIAL UKA vs TKA   Patient Active Problem List   Diagnosis Date Noted   Status post  right partial knee replacement 10/11/2023   Primary osteoarthritis of right knee 06/29/2023   Right tennis elbow 06/29/2023   Pain in right elbow 01/26/2023   Coronary artery calcification seen on CT scan 01/09/2023   Allergic rhinitis 12/09/2022   Microhematuria 12/09/2022   Rectal bleeding 03/13/2022   Other constipation 03/13/2022   Arthritis 12/03/2021   Stress incontinence 11/19/2020   S/p bilateral carpal tunnel release 03/28/2019   Chronic pain of right knee 12/29/2018   Right carpal tunnel syndrome 12/29/2018   Left carpal tunnel syndrome 12/29/2018   Osteopenia 11/08/2018   Pre-diabetes 05/02/2018   Acute pain of right shoulder 03/18/2018   Hyperlipidemia 11/04/2017   Vitamin D  deficiency 11/04/2017   S/P cholecystectomy 08/02/2017   Skier's thumb, right, subsequent encounter 03/15/2017   Chronic left shoulder pain 02/08/2017   Encounter for well adult exam with abnormal findings 09/24/2016   Hypothyroidism 09/24/2016    PCP: Rosalia Colonel, MD  REFERRING PROVIDER: Wes Hamman, MD  REFERRING DIAG: [phone] (ICD-10-CM) - Status post right partial knee replacement  THERAPY DIAG:  Acute pain of right knee  Stiffness of right knee, not elsewhere classified  Difficulty in walking, not elsewhere classified  Primary osteoarthritis of right knee  Rationale for Evaluation and Treatment: Rehabilitation  ONSET DATE: 10/11/23  SUBJECTIVE:   SUBJECTIVE STATEMENT: Pt reports 6/10 pain today. Cut back her pain medications today. Feels she  sees more benefit from muscle relaxers than pain meds. Reports HEP been going well. Doing better with things around the house. Stairs going well. Tried some walking around without AD.   Evaluation:  Goes by Alisha Reed; knee pain for a while; had injections for several years but then stopped helping her.  She saw Dr. Christiane Cowing; had surgery 10/11/23; has CPM machine 6 hours a day.    PERTINENT HISTORY: OR nurse PAIN:  Are you having pain? Yes: NPRS  scale: 8/10 Pain location: right knee; and down back of calf Pain description: sore, tight, stabbing and aching Aggravating factors: movement Relieving factors: oxycodone  with acetaminophen , ice man  PRECAUTIONS: None  RED FLAGS: None   WEIGHT BEARING RESTRICTIONS: No  FALLS:  Has patient fallen in last 6 months? No  LIVING ENVIRONMENT: Lives with: lives with their spouse Lives in: House/apartment Stairs: Yes: Internal: 14 steps; on left going up and External: 2 steps; on right going up, on left going up, and bilateral but cannot reach both Has following equipment at home: Otho Blitz - 2 wheeled  OCCUPATION: OR nurse  PLOF: Independent  PATIENT GOALS: go back to work; going to [employer] in 5 weeks[place]  NEXT MD VISIT: 10/26/23  OBJECTIVE:  Note: Objective measures were completed at Evaluation unless otherwise noted.  DIAGNOSTIC FINDINGS:   PATIENT SURVEYS:  LEFS 13/80 Extreme difficulty/unable (0), Quite a bit of difficulty (1), Moderate difficulty (2), Little difficulty (3), No difficulty (4) Survey date:    Any of your usual work, housework or school activities   2. Usual hobbies, recreational or sporting activities   3. Getting into/out of the bath   4. Walking between rooms   5. Putting on socks/shoes   6. Squatting    7. Lifting an object, like a bag of groceries from the floor   8. Performing light activities around your home   9. Performing heavy activities around your home   10. Getting into/out of a car   11. Walking 2 blocks   12. Walking 1 mile   13. Going up/down 10 stairs (1 flight)   14. Standing for 1 hour   15.  sitting for 1 hour   16. Running on even ground   17. Running on uneven ground   18. Making sharp turns while running fast   19. Hopping    20. Rolling over in bed   Score total:  13/80     COGNITION: Overall cognitive status: Within functional limits for tasks assessed     SENSATION: Some numbness outside of lower leg  EDEMA:   Normal for this time s/p partial TKA; bandage in place   POSTURE: rounded shoulders and forward head  PALPATION: General soreness and redness  LOWER EXTREMITY ROM:  Active ROM Right eval Left eval Right 10/21/23 Right  10/25/23  Hip flexion      Hip extension      Hip abduction      Hip adduction      Hip internal rotation      Hip external rotation      Knee flexion 76 AAROM  88 AAROM with rope 92  Knee extension -8  5 lacking from neutral 3  Ankle dorsiflexion      Ankle plantarflexion      Ankle inversion      Ankle eversion       (Blank rows = not tested)  LOWER EXTREMITY MMT:  MMT Right eval Left eval  Hip flexion  Hip extension    Hip abduction    Hip adduction    Hip internal rotation    Hip external rotation    Knee flexion    Knee extension Poor quad set   Ankle dorsiflexion    Ankle plantarflexion    Ankle inversion    Ankle eversion     (Blank rows = not tested)   FUNCTIONAL TESTS:  5 times sit to stand: 41.01 2 minute walk test: 75 ft with RW  GAIT: Distance walked: 75 ft in clinic Assistive device utilized: Walker - 2 wheeled Level of assistance: SBA Comments: antalgic gait                                                                                                                                TREATMENT DATE:  10/25/23: Recumbent Bike, seat 9', 5', half revolutions  ROM measurement STM to R quad and calf SAQ, 2x10,  SLR, two attempts Seated Heel slides, 2x10 LAQ, 2x10 STS, 10x, tactile feedback at L hip    10/21/23: Therapist adjusted LBQC, cueing for heel strike and toe push off   Supine: Quad set 10x SAQ 5x 5 Heel slide with rope AAROM AROM 5-88 degrees Seated:  LAQ 5x  Heel slides 10x Heel and toe raises Long sit calf strech 2x 30 Bike rocking seat 9 x 5'  10/19/23 Bike seat 6  rocking 5 minutes Standing: knee flexion stretch 8 step 10X10  Knee flexion 10X Gait with QC 100 feet Stair negotiation 7  with Lt HR and WBQC 2RT   10/14/23 physical therapy evaluation and HEP instruction    PATIENT EDUCATION:  Education details: Patient educated on exam findings, POC, scope of PT, HEP, and what to expect in the next few visits. Person educated: Patient Education method: Explanation, Demonstration, and Handouts Education comprehension: verbalized understanding, returned demonstration, verbal cues required, and tactile cues required  HOME EXERCISE PROGRAM: Access Code: [email or web address] URL: [email or web address] Date: 10/14/2023 Prepared by: [facility] - Rehab  Exercises - Supine Ankle Pumps  - 1 x daily - 7 x weekly - 1 sets - 15 reps - Supine Quadricep Sets  - 1 x daily - 7 x weekly - 1 sets - 15 reps - Supine Short Arc Quad  - 1 x daily - 7 x weekly - 1 sets - 15 reps - Supine Heel Slides  - 1 x daily - 7 x weekly - 1 sets - 15 reps - Seated Long Arc Quad  - 1 x daily - 7 x weekly - 1 sets - 15 reps - Seated Knee Flexion Stretch  - 1 x daily - 7 x weekly - 1 sets - 15 reps - Seated Heel Toe Raises  - 1 x daily - 7 x weekly - 1 sets - 15 reps - Supine Heel Slide with Strap  - 1 x daily - 7 x weekly - 1 sets - 10 reps - Long Sitting  Calf Stretch with Strap  - 1 x daily - 7 x weekly - 1 sets - 5 reps - 20 sec hold  ASSESSMENT:  CLINICAL IMPRESSION: Patient exactly 2 weeks post-op this date. Began on recumbent bike for general warm up. Patient able to achieve 3/4 revolutions. ROM measurements this date are above in chart. Attempts at [facility], pt requiring assist due to continued poor quad engagement. Followed with mobility and strengthening in seated and standing positions. Patient demonstrate preference to L side during STS. Tactile cueing/feedback given at hip to promote equal weight shift throughout with improvements noted. Patient will benefit from continued skilled physical therapy in order to address ROM, strength, balance, and endurance to return to PLOF and  independence.     Evaluation: Patient is a 63 y.o. female who was seen today for physical therapy evaluation and treatment for Z96.651 (ICD-10-CM) - Status post right partial knee replacement. Patient demonstrates muscle weakness, reduced ROM, and fascial restrictions which are likely contributing to symptoms of pain and are negatively impacting patient ability to perform ADLs and functional mobility tasks. Patient will benefit from skilled physical therapy services to address these deficits to reduce pain and improve level of function with ADLs and functional mobility tasks.   OBJECTIVE IMPAIRMENTS: Abnormal gait, decreased activity tolerance, decreased endurance, decreased mobility, difficulty walking, decreased ROM, decreased strength, increased fascial restrictions, impaired perceived functional ability, and pain.   ACTIVITY LIMITATIONS: carrying, lifting, bending, sitting, standing, squatting, sleeping, stairs, transfers, bed mobility, bathing, toileting, dressing, locomotion level, and caring for others  PARTICIPATION LIMITATIONS: meal prep, cleaning, laundry, driving, shopping, community activity, and occupation    REHAB POTENTIAL: Good  CLINICAL DECISION MAKING: Evolving/moderate complexity  EVALUATION COMPLEXITY: Moderate   GOALS: Goals reviewed with patient? Yes  SHORT TERM GOALS: Target date: 11/04/2023 patient will be independent with initial HEP  Baseline: Goal status: INITIAL  2.  Patient will report 30% improvement overall  Baseline:  Goal status: INITIAL  3.  Patient will increase right knee mobility to -5 to 90 to promote normal navigation of steps; step over step pattern  Baseline: see above Goal status: INITIAL   LONG TERM GOALS: Target date: 12/06/2023  Patient will be independent in self management strategies to improve quality of life and functional outcomes.  Baseline:  Goal status: INITIAL  2.  Patient will report 50% improvement  overall  Baseline:  Goal status: INITIAL  3.  Patient will increase right knee mobility to -2 to 120 to promote normal navigation of steps; step over step pattern  Baseline:  Goal status: INITIAL  4.  Patient will improve LEFS score by 27 points to demonstrate improved perceived function  Baseline: 13/80 Goal status: INITIAL  5.  Patient will increase distance on 2 MWT by 100 ft with LRAD to demonstrate improved functional gait in community  Baseline: 75 ft with RW Goal status: INITIAL  6.   Patient will increase right leg MMT's to 5/5 to allow navigation of steps without gait deviation or loss of balance  Baseline:  Goal status: INITIAL   PLAN:  PT FREQUENCY: 2x/week  PT DURATION: 6 weeks  PLANNED INTERVENTIONS: 97164- PT Re-evaluation, 97110-Therapeutic exercises, 97530- Therapeutic activity, 97112- Neuromuscular re-education, 97535- Self Care, 75643- Manual therapy, [id number]- Gait training, [phone]- Orthotic Fit/training, [phone]- Canalith repositioning, [id number]- Aquatic Therapy, [id number]- Splinting, [id number]- Wound care (first 20 sq cm), 97598- Wound care (each additional 20 sq cm)Patient/Family education, Balance training, Stair training, Taping, Dry Needling, Joint mobilization, Joint manipulation, Spinal manipulation, Spinal  mobilization, Scar mobilization, and DME instructions.   PLAN FOR NEXT SESSION: continue to progress ROM, functional strength and gait with LRAD.  12:03 PM, 10/25/23 Marysue Sola, PT, DPT [facility] [place]

## 2023-10-26 ENCOUNTER — Ambulatory Visit (INDEPENDENT_AMBULATORY_CARE_PROVIDER_SITE_OTHER): Admitting: Orthopaedic Surgery

## 2023-10-26 DIAGNOSIS — Z96651 Presence of right artificial knee joint: Secondary | ICD-10-CM

## 2023-10-26 NOTE — Progress Notes (Signed)
 Post-Op Visit Note   Patient: Alisha Reed           Date of Birth: February 25, 1961           MRN: [id number] Visit Date: 10/26/2023 PCP: Roslyn Coombe, MD   Assessment & Plan:  Chief Complaint:  Chief Complaint  Patient presents with   Right Knee - Routine Post Op    10/11/23 RT Knee Arthroplasty    Visit Diagnoses:  1. Status post right partial knee replacement     Plan: History of Present Illness Alisha Reed is a 63 year old female who presents for follow-up after knee surgery. She is accompanied by her grandson and husband.  She experiences significant postoperative pain following her partial knee replacement, inadequately controlled by oxycodone , which also causes sedation. She manages bowel movements with Colace and Miralax .  She actively participates in physical therapy but has difficulty with exercises, achieving ninety-three degrees of flexion. She uses a four-prong cane and considers transitioning to a one-prong cane. Significant pain after quad exercises affects her sleep.  She is on Eliquis  for anticoagulation due to increased risk of blood clots from estrogen use and reduced mobility. She inquires about the timeline for returning to swimming, currently restricted until further incision healing.  Assessment and Plan Status post right uni - Continue physical therapy regimen. - Evaluate incision site before permitting swimming pool activities in one month. - Schedule follow-up appointment in four weeks with x-rays.  Postoperative pain Pain not well controlled with current regimen. Frequent oxycodone  use. Tramadol  considered for daytime use without contraindications with Eliquis . - Allow use of tramadol  during the day for pain management. - Continue current pain management regimen with oxycodone  as needed.  Use of anticoagulant therapy On Eliquis  due to increased clot risk post-surgery and estrogen therapy. Discussed potential changes to allow  anti-inflammatory use. Prefers current regimen due to mood concerns. - Continue Eliquis  as current anticoagulation therapy.  Follow-Up Instructions: Return in about 4 weeks (around 11/23/2023).   Orders:  No orders of the defined types were placed in this encounter.  No orders of the defined types were placed in this encounter.   Imaging: No results found.  PMFS History: Patient Active Problem List   Diagnosis Date Noted   Status post right partial knee replacement 10/11/2023   Primary osteoarthritis of right knee 06/29/2023   Right tennis elbow 06/29/2023   Pain in right elbow 01/26/2023   Coronary artery calcification seen on CT scan 01/09/2023   Allergic rhinitis 12/09/2022   Microhematuria 12/09/2022   Rectal bleeding 03/13/2022   Other constipation 03/13/2022   Arthritis 12/03/2021   Stress incontinence 11/19/2020   S/p bilateral carpal tunnel release 03/28/2019   Chronic pain of right knee 12/29/2018   Right carpal tunnel syndrome 12/29/2018   Left carpal tunnel syndrome 12/29/2018   Osteopenia 11/08/2018   Pre-diabetes 05/02/2018   Acute pain of right shoulder 03/18/2018   Hyperlipidemia 11/04/2017   Vitamin D  deficiency 11/04/2017   S/P cholecystectomy 08/02/2017   Skier's thumb, right, subsequent encounter 03/15/2017   Chronic left shoulder pain 02/08/2017   Encounter for well adult exam with abnormal findings 09/24/2016   Hypothyroidism 09/24/2016   Past Medical History:  Diagnosis Date   Arthritis    hands,    Carpal tunnel syndrome on both sides    both hands surgeries 01/2019   GERD (gastroesophageal reflux disease)    Hypercholesterolemia    Hypothyroidism    Osteopenia 11/08/2018  Thyroid  disease     Family History  Problem Relation Age of Onset   Scleroderma Mother    Rheum arthritis Mother    Heart disease Mother    Heart disease Father    Glaucoma Father    Stomach cancer Maternal Grandmother    Heart attack Maternal Grandfather     Alzheimer's disease Paternal Grandmother    Throat cancer Paternal Grandfather    Colon cancer Neg Hx    Liver cancer Neg Hx    Colon polyps Neg Hx    Esophageal cancer Neg Hx    Rectal cancer Neg Hx     Past Surgical History:  Procedure Laterality Date   BILATERAL CARPAL TUNNEL RELEASE Bilateral 01/18/2019   Procedure: BILATERAL CARPAL TUNNEL RELEASE;  Surgeon: Wes Hamman, MD;  Location: [facility];  Service: Orthopedics;  Laterality: Bilateral;   BLADDER SUSPENSION N/A 08/10/2022   Procedure: TRANSVAGINAL TAPE (TVT) PROCEDURE;  Surgeon: Arma Lamp, MD;  Location: [facility];  Service: Gynecology;  Laterality: N/A;  Total time requested is 1 hour.   CHOLECYSTECTOMY     COLONOSCOPY  2011   CYSTOSCOPY N/A 08/10/2022   Procedure: CYSTOSCOPY;  Surgeon: Arma Lamp, MD;  Location: [facility];  Service: Gynecology;  Laterality: N/A;   FOOT SURGERY Bilateral    HYSTERECTOMY ABDOMINAL WITH SALPINGECTOMY     NASAL SINUS SURGERY     PARTIAL KNEE ARTHROPLASTY Right 10/11/2023   Procedure: ARTHROPLASTY, KNEE, UNICOMPARTMENTAL;  Surgeon: Wes Hamman, MD;  Location: [facility];  Service: Orthopedics;  Laterality: Right;  RIGHT KNEE MEDIAL UKA vs TKA   Social History   Occupational History   Occupation: Charity fundraiser  Tobacco Use   Smoking status: Never   Smokeless tobacco: Never  Vaping Use   Vaping status: Never Used  Substance and Sexual Activity   Alcohol use: Yes    Comment: social- maybe once a month   Drug use: No   Sexual activity: Yes    Birth control/protection: Surgical

## 2023-10-28 ENCOUNTER — Ambulatory Visit (HOSPITAL_COMMUNITY)

## 2023-10-28 ENCOUNTER — Encounter (HOSPITAL_COMMUNITY): Payer: Self-pay

## 2023-10-28 DIAGNOSIS — M25561 Pain in right knee: Secondary | ICD-10-CM

## 2023-10-28 DIAGNOSIS — M25661 Stiffness of right knee, not elsewhere classified: Secondary | ICD-10-CM

## 2023-10-28 DIAGNOSIS — Z96651 Presence of right artificial knee joint: Secondary | ICD-10-CM | POA: Diagnosis not present

## 2023-10-28 DIAGNOSIS — R262 Difficulty in walking, not elsewhere classified: Secondary | ICD-10-CM | POA: Diagnosis not present

## 2023-10-28 DIAGNOSIS — M1711 Unilateral primary osteoarthritis, right knee: Secondary | ICD-10-CM

## 2023-10-28 NOTE — Therapy (Signed)
 OUTPATIENT PHYSICAL THERAPY LOWER EXTREMITY TREATMENT Patient Name: Alisha Reed MRN: [id number] DOB:1961-03-28, 63 y.o., female Today's Date: 10/28/2023  END OF SESSION:  PT End of Session - 10/28/23 0853     Visit Number 5    Number of Visits 12    Date for PT Re-Evaluation 11/26/23    Authorization Type [employer]    PT Start Time [phone]    PT Stop Time [phone]    PT Time Calculation (min) 40 min    Activity Tolerance Patient tolerated treatment well    Behavior During Therapy [facility] for tasks assessed/performed           Past Medical History:  Diagnosis Date   Arthritis    hands,    Carpal tunnel syndrome on both sides    both hands surgeries 01/2019   GERD (gastroesophageal reflux disease)    Hypercholesterolemia    Hypothyroidism    Osteopenia 11/08/2018   Thyroid  disease    Past Surgical History:  Procedure Laterality Date   BILATERAL CARPAL TUNNEL RELEASE Bilateral 01/18/2019   Procedure: BILATERAL CARPAL TUNNEL RELEASE;  Surgeon: Wes Hamman, MD;  Location: [facility];  Service: Orthopedics;  Laterality: Bilateral;   BLADDER SUSPENSION N/A 08/10/2022   Procedure: TRANSVAGINAL TAPE (TVT) PROCEDURE;  Surgeon: Arma Lamp, MD;  Location: [facility];  Service: Gynecology;  Laterality: N/A;  Total time requested is 1 hour.   CHOLECYSTECTOMY     COLONOSCOPY  2011   CYSTOSCOPY N/A 08/10/2022   Procedure: CYSTOSCOPY;  Surgeon: Arma Lamp, MD;  Location: [facility];  Service: Gynecology;  Laterality: N/A;   FOOT SURGERY Bilateral    HYSTERECTOMY ABDOMINAL WITH SALPINGECTOMY     NASAL SINUS SURGERY     PARTIAL KNEE ARTHROPLASTY Right 10/11/2023   Procedure: ARTHROPLASTY, KNEE, UNICOMPARTMENTAL;  Surgeon: Wes Hamman, MD;  Location: [facility];  Service: Orthopedics;  Laterality: Right;  RIGHT KNEE MEDIAL UKA vs TKA   Patient Active Problem List   Diagnosis Date Noted   Status post  right partial knee replacement 10/11/2023   Primary osteoarthritis of right knee 06/29/2023   Right tennis elbow 06/29/2023   Pain in right elbow 01/26/2023   Coronary artery calcification seen on CT scan 01/09/2023   Allergic rhinitis 12/09/2022   Microhematuria 12/09/2022   Rectal bleeding 03/13/2022   Other constipation 03/13/2022   Arthritis 12/03/2021   Stress incontinence 11/19/2020   S/p bilateral carpal tunnel release 03/28/2019   Chronic pain of right knee 12/29/2018   Right carpal tunnel syndrome 12/29/2018   Left carpal tunnel syndrome 12/29/2018   Osteopenia 11/08/2018   Pre-diabetes 05/02/2018   Acute pain of right shoulder 03/18/2018   Hyperlipidemia 11/04/2017   Vitamin D  deficiency 11/04/2017   S/P cholecystectomy 08/02/2017   Skier's thumb, right, subsequent encounter 03/15/2017   Chronic left shoulder pain 02/08/2017   Encounter for well adult exam with abnormal findings 09/24/2016   Hypothyroidism 09/24/2016    PCP: Rosalia Colonel, MD  REFERRING PROVIDER: Wes Hamman, MD  REFERRING DIAG: [phone] (ICD-10-CM) - Status post right partial knee replacement  THERAPY DIAG:  Acute pain of right knee  Stiffness of right knee, not elsewhere classified  Difficulty in walking, not elsewhere classified  Primary osteoarthritis of right knee  Rationale for Evaluation and Treatment: Rehabilitation  ONSET DATE: 10/11/23  SUBJECTIVE:   SUBJECTIVE STATEMENT: Current pain scale 6/10 achy pain.  Arrived with QC.  Has been  trying to ween off medication.   Evaluation:  Goes by Alisha Reed; knee pain for a while; had injections for several years but then stopped helping her.  She saw Dr. Christiane Cowing; had surgery 10/11/23; has CPM machine 6 hours a day.    PERTINENT HISTORY: OR nurse PAIN:  Are you having pain? Yes: NPRS scale: 8/10 Pain location: right knee; and down back of calf Pain description: sore, tight, stabbing and aching Aggravating factors: movement Relieving factors:  oxycodone  with acetaminophen , ice man  PRECAUTIONS: None  RED FLAGS: None   WEIGHT BEARING RESTRICTIONS: No  FALLS:  Has patient fallen in last 6 months? No  LIVING ENVIRONMENT: Lives with: lives with their spouse Lives in: House/apartment Stairs: Yes: Internal: 14 steps; on left going up and External: 2 steps; on right going up, on left going up, and bilateral but cannot reach both Has following equipment at home: Otho Blitz - 2 wheeled  OCCUPATION: OR nurse  PLOF: Independent  PATIENT GOALS: go back to work; going to [employer] in 5 weeks[place]  NEXT MD VISIT: 10/26/23  OBJECTIVE:  Note: Objective measures were completed at Evaluation unless otherwise noted.  DIAGNOSTIC FINDINGS:   PATIENT SURVEYS:  LEFS 13/80 Extreme difficulty/unable (0), Quite a bit of difficulty (1), Moderate difficulty (2), Little difficulty (3), No difficulty (4) Survey date:    Any of your usual work, housework or school activities   2. Usual hobbies, recreational or sporting activities   3. Getting into/out of the bath   4. Walking between rooms   5. Putting on socks/shoes   6. Squatting    7. Lifting an object, like a bag of groceries from the floor   8. Performing light activities around your home   9. Performing heavy activities around your home   10. Getting into/out of a car   11. Walking 2 blocks   12. Walking 1 mile   13. Going up/down 10 stairs (1 flight)   14. Standing for 1 hour   15.  sitting for 1 hour   16. Running on even ground   17. Running on uneven ground   18. Making sharp turns while running fast   19. Hopping    20. Rolling over in bed   Score total:  13/80     COGNITION: Overall cognitive status: Within functional limits for tasks assessed     SENSATION: Some numbness outside of lower leg  EDEMA:  Normal for this time s/p partial TKA; bandage in place   POSTURE: rounded shoulders and forward head  PALPATION: General soreness and redness  LOWER  EXTREMITY ROM:  Active ROM Right eval Left eval Right 10/21/23 Right  10/25/23  Hip flexion      Hip extension      Hip abduction      Hip adduction      Hip internal rotation      Hip external rotation      Knee flexion 76 AAROM  88 AAROM with rope 92  Knee extension -8  5 lacking from neutral 3  Ankle dorsiflexion      Ankle plantarflexion      Ankle inversion      Ankle eversion       (Blank rows = not tested)  LOWER EXTREMITY MMT:  MMT Right eval Left eval  Hip flexion    Hip extension    Hip abduction    Hip adduction    Hip internal rotation    Hip external rotation  Knee flexion    Knee extension Poor quad set   Ankle dorsiflexion    Ankle plantarflexion    Ankle inversion    Ankle eversion     (Blank rows = not tested)   FUNCTIONAL TESTS:  5 times sit to stand: 41.01 2 minute walk test: 75 ft with RW  GAIT: Distance walked: 75 ft in clinic Assistive device utilized: Walker - 2 wheeled Level of assistance: SBA Comments: antalgic gait                                                                                                                                TREATMENT DATE:  10/28/23: Recumbent Bike, seat 7', 5', half revolutions  Standing: - Knee drive on 40JW step height 5x 10 holds - Heel raises incline slope 10x - TKE RTB 10x 5 - Gait training with SPC x45ft (cueing for toe push off) - TKE with ball against wall Supine:  -SAQ   - Quad set  - Heel slide  - 4-96 degrees  10/25/23: Recumbent Bike, seat 9', 5', half revolutions  ROM measurement STM to R quad and calf SAQ, 2x10,  SLR, two attempts Seated Heel slides, 2x10 LAQ, 2x10 STS, 10x, tactile feedback at L hip    10/21/23: Therapist adjusted LBQC, cueing for heel strike and toe push off   Supine: Quad set 10x SAQ 5x 5 Heel slide with rope AAROM AROM 5-88 degrees Seated:  LAQ 5x  Heel slides 10x Heel and toe raises Long sit calf strech 2x 30 Bike rocking seat 9 x  5'  10/19/23 Bike seat 6  rocking 5 minutes Standing: knee flexion stretch 8 step 10X10  Knee flexion 10X Gait with QC 100 feet Stair negotiation 7 with Lt HR and WBQC 2RT   10/14/23 physical therapy evaluation and HEP instruction    PATIENT EDUCATION:  Education details: Patient educated on exam findings, POC, scope of PT, HEP, and what to expect in the next few visits. Person educated: Patient Education method: Explanation, Demonstration, and Handouts Education comprehension: verbalized understanding, returned demonstration, verbal cues required, and tactile cues required  HOME EXERCISE PROGRAM: Access Code: [email or web address] URL: [email or web address] Date: 10/14/2023 Prepared by: [facility] - Rehab  Exercises - Supine Ankle Pumps  - 1 x daily - 7 x weekly - 1 sets - 15 reps - Supine Quadricep Sets  - 1 x daily - 7 x weekly - 1 sets - 15 reps - Supine Short Arc Quad  - 1 x daily - 7 x weekly - 1 sets - 15 reps - Supine Heel Slides  - 1 x daily - 7 x weekly - 1 sets - 15 reps - Seated Long Arc Quad  - 1 x daily - 7 x weekly - 1 sets - 15 reps - Seated Knee Flexion Stretch  - 1 x daily - 7 x weekly - 1 sets - 15 reps - Seated Heel Toe Raises  -  1 x daily - 7 x weekly - 1 sets - 15 reps - Supine Heel Slide with Strap  - 1 x daily - 7 x weekly - 1 sets - 10 reps - Long Sitting Calf Stretch with Strap  - 1 x daily - 7 x weekly - 1 sets - 5 reps - 20 sec hold  10/28/23: - Standing Terminal Knee Extension at Wall with Ball  - 2 x daily - 7 x weekly - 1 sets - 10 reps - 5 hold - Heel Toe Raises with Counter Support  - 2 x daily - 7 x weekly - 3 sets - 10 reps ASSESSMENT:  CLINICAL IMPRESSION: Session focus with ROM and proximal strengthening.  Began session on recumbent bike for dynamic warm up, able to achieve 3/4 revolutions.  Pt continues to demonstrate weak quadriceps noted with inability to lift with half bolster SAQ or SLR without extension lag.  Added TKE based exercises and  given handout to add to HEP.  Worked on gait training with LRAD, pt able to demonstrate good 2 point sequence, stable with no LOB, verbal cueing to improve toe push off to reduce circumduction.  AROM 4-96 degrees.  Pt reports of increased pain at EOS, reviewed RICE techniques for pain and edema control.    Evaluation: Patient is a 63 y.o. female who was seen today for physical therapy evaluation and treatment for Z96.651 (ICD-10-CM) - Status post right partial knee replacement. Patient demonstrates muscle weakness, reduced ROM, and fascial restrictions which are likely contributing to symptoms of pain and are negatively impacting patient ability to perform ADLs and functional mobility tasks. Patient will benefit from skilled physical therapy services to address these deficits to reduce pain and improve level of function with ADLs and functional mobility tasks.   OBJECTIVE IMPAIRMENTS: Abnormal gait, decreased activity tolerance, decreased endurance, decreased mobility, difficulty walking, decreased ROM, decreased strength, increased fascial restrictions, impaired perceived functional ability, and pain.   ACTIVITY LIMITATIONS: carrying, lifting, bending, sitting, standing, squatting, sleeping, stairs, transfers, bed mobility, bathing, toileting, dressing, locomotion level, and caring for others  PARTICIPATION LIMITATIONS: meal prep, cleaning, laundry, driving, shopping, community activity, and occupation    REHAB POTENTIAL: Good  CLINICAL DECISION MAKING: Evolving/moderate complexity  EVALUATION COMPLEXITY: Moderate   GOALS: Goals reviewed with patient? Yes  SHORT TERM GOALS: Target date: 11/04/2023 patient will be independent with initial HEP  Baseline: Goal status: INITIAL  2.  Patient will report 30% improvement overall  Baseline:  Goal status: INITIAL  3.  Patient will increase right knee mobility to -5 to 90 to promote normal navigation of steps; step over step  pattern  Baseline: see above Goal status: INITIAL   LONG TERM GOALS: Target date: 12/06/2023  Patient will be independent in self management strategies to improve quality of life and functional outcomes.  Baseline:  Goal status: INITIAL  2.  Patient will report 50% improvement overall  Baseline:  Goal status: INITIAL  3.  Patient will increase right knee mobility to -2 to 120 to promote normal navigation of steps; step over step pattern  Baseline:  Goal status: INITIAL  4.  Patient will improve LEFS score by 27 points to demonstrate improved perceived function  Baseline: 13/80 Goal status: INITIAL  5.  Patient will increase distance on 2 MWT by 100 ft with LRAD to demonstrate improved functional gait in community  Baseline: 75 ft with RW Goal status: INITIAL  6.   Patient will increase right leg MMT's to 5/5  to allow navigation of steps without gait deviation or loss of balance  Baseline:  Goal status: INITIAL   PLAN:  PT FREQUENCY: 2x/week  PT DURATION: 6 weeks  PLANNED INTERVENTIONS: 97164- PT Re-evaluation, 97110-Therapeutic exercises, 97530- Therapeutic activity, 97112- Neuromuscular re-education, 97535- Self Care, 16109- Manual therapy, [id number]- Gait training, [phone]- Orthotic Fit/training, [phone]- Canalith repositioning, [id number]- Aquatic Therapy, [phone]- Splinting, [phone]- Wound care (first 20 sq cm), 97598- Wound care (each additional 20 sq cm)Patient/Family education, Balance training, Stair training, Taping, Dry Needling, Joint mobilization, Joint manipulation, Spinal manipulation, Spinal mobilization, Scar mobilization, and DME instructions.   PLAN FOR NEXT SESSION: continue to progress ROM, functional strength and gait with LRAD.  Minor Amble, LPTA/CLT; CBIS [phone]  9:52 AM, 10/28/23

## 2023-10-29 ENCOUNTER — Other Ambulatory Visit: Payer: Self-pay | Admitting: Physician Assistant

## 2023-10-29 MED ORDER — OXYCODONE-ACETAMINOPHEN 5-325 MG PO TABS: 1.0000 | ORAL_TABLET | Freq: Two times a day (BID) | ORAL | 0 refills | Status: DC | PRN

## 2023-10-29 MED ORDER — TRAMADOL HCL 50 MG PO TABS: 50.0000 mg | ORAL_TABLET | Freq: Four times a day (QID) | ORAL | 0 refills | Status: DC | PRN

## 2023-10-29 MED ORDER — METHOCARBAMOL 500 MG PO TABS: 500.0000 mg | ORAL_TABLET | Freq: Two times a day (BID) | ORAL | 2 refills | Status: DC | PRN

## 2023-11-02 ENCOUNTER — Encounter (HOSPITAL_COMMUNITY): Payer: Self-pay

## 2023-11-02 ENCOUNTER — Ambulatory Visit (HOSPITAL_COMMUNITY)

## 2023-11-02 DIAGNOSIS — M25561 Pain in right knee: Secondary | ICD-10-CM | POA: Diagnosis not present

## 2023-11-02 DIAGNOSIS — R262 Difficulty in walking, not elsewhere classified: Secondary | ICD-10-CM | POA: Diagnosis not present

## 2023-11-02 DIAGNOSIS — M25661 Stiffness of right knee, not elsewhere classified: Secondary | ICD-10-CM

## 2023-11-02 DIAGNOSIS — Z96651 Presence of right artificial knee joint: Secondary | ICD-10-CM | POA: Diagnosis not present

## 2023-11-02 DIAGNOSIS — M1711 Unilateral primary osteoarthritis, right knee: Secondary | ICD-10-CM | POA: Diagnosis not present

## 2023-11-02 NOTE — Therapy (Signed)
 OUTPATIENT PHYSICAL THERAPY LOWER EXTREMITY TREATMENT Patient Name: Alisha Reed MRN: [id number] DOB:12/01/1960, 63 y.o., female Today's Date: 11/02/2023  END OF SESSION:  PT End of Session - 11/02/23 0802     Visit Number 6    Number of Visits 12    Date for PT Re-Evaluation 11/26/23    Authorization Type [employer]    PT Start Time 0805    PT Stop Time [phone]    PT Time Calculation (min) 38 min    Activity Tolerance Patient tolerated treatment well    Behavior During Therapy [facility] for tasks assessed/performed           Past Medical History:  Diagnosis Date   Arthritis    hands,    Carpal tunnel syndrome on both sides    both hands surgeries 01/2019   GERD (gastroesophageal reflux disease)    Hypercholesterolemia    Hypothyroidism    Osteopenia 11/08/2018   Thyroid  disease    Past Surgical History:  Procedure Laterality Date   BILATERAL CARPAL TUNNEL RELEASE Bilateral 01/18/2019   Procedure: BILATERAL CARPAL TUNNEL RELEASE;  Surgeon: Jerri Kay HERO, MD;  Location: [facility];  Service: Orthopedics;  Laterality: Bilateral;   BLADDER SUSPENSION N/A 08/10/2022   Procedure: TRANSVAGINAL TAPE (TVT) PROCEDURE;  Surgeon: Marilynne Rosaline SAILOR, MD;  [facility];  Service: Gynecology;  Laterality: N/A;  Total time requested is 1 hour.   CHOLECYSTECTOMY     COLONOSCOPY  2011   CYSTOSCOPY N/A 08/10/2022   Procedure: CYSTOSCOPY;  Surgeon: Marilynne Rosaline SAILOR, MD;  Location: [facility];  Service: Gynecology;  Laterality: N/A;   FOOT SURGERY Bilateral    HYSTERECTOMY ABDOMINAL WITH SALPINGECTOMY     NASAL SINUS SURGERY     PARTIAL KNEE ARTHROPLASTY Right 10/11/2023   Procedure: ARTHROPLASTY, KNEE, UNICOMPARTMENTAL;  Surgeon: Jerri Kay HERO, MD;  Location: [facility];  Service: Orthopedics;  Laterality: Right;  RIGHT KNEE MEDIAL UKA vs TKA   Patient Active Problem List   Diagnosis Date Noted   Status post  right partial knee replacement 10/11/2023   Primary osteoarthritis of right knee 06/29/2023   Right tennis elbow 06/29/2023   Pain in right elbow 01/26/2023   Coronary artery calcification seen on CT scan 01/09/2023   Allergic rhinitis 12/09/2022   Microhematuria 12/09/2022   Rectal bleeding 03/13/2022   Other constipation 03/13/2022   Arthritis 12/03/2021   Stress incontinence 11/19/2020   S/p bilateral carpal tunnel release 03/28/2019   Chronic pain of right knee 12/29/2018   Right carpal tunnel syndrome 12/29/2018   Left carpal tunnel syndrome 12/29/2018   Osteopenia 11/08/2018   Pre-diabetes 05/02/2018   Acute pain of right shoulder 03/18/2018   Hyperlipidemia 11/04/2017   Vitamin D  deficiency 11/04/2017   S/P cholecystectomy 08/02/2017   Skier's thumb, right, subsequent encounter 03/15/2017   Chronic left shoulder pain 02/08/2017   Encounter for well adult exam with abnormal findings 09/24/2016   Hypothyroidism 09/24/2016    PCP: Lynwood Rush, MD  REFERRING PROVIDER: Jerri Kay HERO, MD  REFERRING DIAG: [phone] (ICD-10-CM) - Status post right partial knee replacement  THERAPY DIAG:  Acute pain of right knee  Stiffness of right knee, not elsewhere classified  Difficulty in walking, not elsewhere classified  Primary osteoarthritis of right knee  Rationale for Evaluation and Treatment: Rehabilitation  ONSET DATE: 10/11/23  SUBJECTIVE:   SUBJECTIVE STATEMENT: Pt reports she has been walking a little without cane. No falls. Reports  she has not taken any pain medication today. Seeing progress.    Evaluation:  Goes by Alisha Reed; knee pain for a while; had injections for several years but then stopped helping her.  She saw Dr. Jerri; had surgery 10/11/23; has CPM machine 6 hours a day.    PERTINENT HISTORY: OR nurse PAIN:  Are you having pain? Yes: NPRS scale: 8/10 Pain location: right knee; and down back of calf Pain description: sore, tight, stabbing and  aching Aggravating factors: movement Relieving factors: oxycodone  with acetaminophen , ice man  PRECAUTIONS: None  RED FLAGS: None   WEIGHT BEARING RESTRICTIONS: No  FALLS:  Has patient fallen in last 6 months? No  LIVING ENVIRONMENT: Lives with: lives with their spouse Lives in: House/apartment Stairs: Yes: Internal: 14 steps; on left going up and External: 2 steps; on right going up, on left going up, and bilateral but cannot reach both Has following equipment at home: Vannie - 2 wheeled  OCCUPATION: OR nurse  PLOF: Independent  PATIENT GOALS: go back to work; going to [employer] in 5 weeks[place]  NEXT MD VISIT: 10/26/23  OBJECTIVE:  Note: Objective measures were completed at Evaluation unless otherwise noted.  DIAGNOSTIC FINDINGS:   PATIENT SURVEYS:  LEFS 13/80 Extreme difficulty/unable (0), Quite a bit of difficulty (1), Moderate difficulty (2), Little difficulty (3), No difficulty (4) Survey date:    Any of your usual work, housework or school activities   2. Usual hobbies, recreational or sporting activities   3. Getting into/out of the bath   4. Walking between rooms   5. Putting on socks/shoes   6. Squatting    7. Lifting an object, like a bag of groceries from the floor   8. Performing light activities around your home   9. Performing heavy activities around your home   10. Getting into/out of a car   11. Walking 2 blocks   12. Walking 1 mile   13. Going up/down 10 stairs (1 flight)   14. Standing for 1 hour   15.  sitting for 1 hour   16. Running on even ground   17. Running on uneven ground   18. Making sharp turns while running fast   19. Hopping    20. Rolling over in bed   Score total:  13/80     COGNITION: Overall cognitive status: Within functional limits for tasks assessed     SENSATION: Some numbness outside of lower leg  EDEMA:  Normal for this time s/p partial TKA; bandage in place   POSTURE: rounded shoulders and forward  head  PALPATION: General soreness and redness  LOWER EXTREMITY ROM:  Active ROM Right eval Left eval Right 10/21/23 Right  10/25/23  Hip flexion      Hip extension      Hip abduction      Hip adduction      Hip internal rotation      Hip external rotation      Knee flexion 76 AAROM  88 AAROM with rope 92  Knee extension -8  5 lacking from neutral 3  Ankle dorsiflexion      Ankle plantarflexion      Ankle inversion      Ankle eversion       (Blank rows = not tested)  LOWER EXTREMITY MMT:  MMT Right eval Left eval  Hip flexion    Hip extension    Hip abduction    Hip adduction    Hip internal rotation  Hip external rotation    Knee flexion    Knee extension Poor quad set   Ankle dorsiflexion    Ankle plantarflexion    Ankle inversion    Ankle eversion     (Blank rows = not tested)   FUNCTIONAL TESTS:  5 times sit to stand: 41.01 2 minute walk test: 75 ft with RW  GAIT: Distance walked: 75 ft in clinic Assistive device utilized: Walker - 2 wheeled Level of assistance: SBA Comments: antalgic gait                                                                                                                                TREATMENT DATE:  11/02/23: Gait: 200 ft, LBQC, v cues for heel strike Seated Heel Slides, 3' Seated knee ext w/ foam roll, w/ light OP3'  ROM measurements STS from mat table, 2x10 Knee drives, at staircase, to second step, 2x10 Heel/toe raises, 10x standing, 10x seated Gastroc stretch, 30x2 Hamstring stretch, 2x30  10/28/23: Recumbent Bike, seat 7', 5', half revolutions  Standing: - Knee drive on 87pw step height 5x 10 holds - Heel raises incline slope 10x - TKE RTB 10x 5 - Gait training with SPC x38ft (cueing for toe push off) - TKE with ball against wall Supine:  -SAQ   - Quad set  - Heel slide  - 4-96 degrees  10/25/23: Recumbent Bike, seat 9', 5', half revolutions  ROM measurement STM to R quad and calf SAQ,  2x10,  SLR, two attempts Seated Heel slides, 2x10 LAQ, 2x10 STS, 10x, tactile feedback at L hip    PATIENT EDUCATION:  Education details: Patient educated on exam findings, POC, scope of PT, HEP, and what to expect in the next few visits. Person educated: Patient Education method: Explanation, Demonstration, and Handouts Education comprehension: verbalized understanding, returned demonstration, verbal cues required, and tactile cues required  HOME EXERCISE PROGRAM: Access Code: [email or web address] URL: [email or web address] Date: 10/14/2023 Prepared by: [facility] - Rehab  Exercises - Supine Ankle Pumps  - 1 x daily - 7 x weekly - 1 sets - 15 reps - Supine Quadricep Sets  - 1 x daily - 7 x weekly - 1 sets - 15 reps - Supine Short Arc Quad  - 1 x daily - 7 x weekly - 1 sets - 15 reps - Supine Heel Slides  - 1 x daily - 7 x weekly - 1 sets - 15 reps - Seated Long Arc Quad  - 1 x daily - 7 x weekly - 1 sets - 15 reps - Seated Knee Flexion Stretch  - 1 x daily - 7 x weekly - 1 sets - 15 reps - Seated Heel Toe Raises  - 1 x daily - 7 x weekly - 1 sets - 15 reps - Supine Heel Slide with Strap  - 1 x daily - 7 x weekly - 1 sets - 10 reps - Long Sitting Calf Stretch with  Strap  - 1 x daily - 7 x weekly - 1 sets - 5 reps - 20 sec hold  10/28/23: - Standing Terminal Knee Extension at Wall with Ball  - 2 x daily - 7 x weekly - 1 sets - 10 reps - 5 hold - Heel Toe Raises with Counter Support  - 2 x daily - 7 x weekly - 3 sets - 10 reps  ASSESSMENT:  CLINICAL IMPRESSION: Patient tolerated session well. Began with gait training, emphasis on increasing heel strike with R heel. ROM measurements this date following R knee mobility: -2 ext, and 102 flexion. Patient requiring very short rest breaks between sets of exercises. During heel and toe raises, patient reporting increased pain in medical aspect of knee. Performed next set seated with improvement of pain. Ended with LE stretching, pt reporting  inc discomfort initially but improvements with second set. Patient will benefit from continued skilled physical therapy in order to address ROM, strength, balance, and endurance to return to PLOF and independence.    Evaluation: Patient is a 63 y.o. female who was seen today for physical therapy evaluation and treatment for Z96.651 (ICD-10-CM) - Status post right partial knee replacement. Patient demonstrates muscle weakness, reduced ROM, and fascial restrictions which are likely contributing to symptoms of pain and are negatively impacting patient ability to perform ADLs and functional mobility tasks. Patient will benefit from skilled physical therapy services to address these deficits to reduce pain and improve level of function with ADLs and functional mobility tasks.   OBJECTIVE IMPAIRMENTS: Abnormal gait, decreased activity tolerance, decreased endurance, decreased mobility, difficulty walking, decreased ROM, decreased strength, increased fascial restrictions, impaired perceived functional ability, and pain.   ACTIVITY LIMITATIONS: carrying, lifting, bending, sitting, standing, squatting, sleeping, stairs, transfers, bed mobility, bathing, toileting, dressing, locomotion level, and caring for others  PARTICIPATION LIMITATIONS: meal prep, cleaning, laundry, driving, shopping, community activity, and occupation    REHAB POTENTIAL: Good  CLINICAL DECISION MAKING: Evolving/moderate complexity  EVALUATION COMPLEXITY: Moderate   GOALS: Goals reviewed with patient? Yes  SHORT TERM GOALS: Target date: 11/04/2023 patient will be independent with initial HEP  Baseline: Goal status: INITIAL  2.  Patient will report 30% improvement overall  Baseline:  Goal status: INITIAL  3.  Patient will increase right knee mobility to -5 to 90 to promote normal navigation of steps; step over step pattern  Baseline: see above Goal status: INITIAL   LONG TERM GOALS: Target date:  12/06/2023  Patient will be independent in self management strategies to improve quality of life and functional outcomes.  Baseline:  Goal status: INITIAL  2.  Patient will report 50% improvement overall  Baseline:  Goal status: INITIAL  3.  Patient will increase right knee mobility to -2 to 120 to promote normal navigation of steps; step over step pattern  Baseline:  Goal status: INITIAL  4.  Patient will improve LEFS score by 27 points to demonstrate improved perceived function  Baseline: 13/80 Goal status: INITIAL  5.  Patient will increase distance on 2 MWT by 100 ft with LRAD to demonstrate improved functional gait in community  Baseline: 75 ft with RW Goal status: INITIAL  6.   Patient will increase right leg MMT's to 5/5 to allow navigation of steps without gait deviation or loss of balance  Baseline:  Goal status: INITIAL   PLAN:  PT FREQUENCY: 2x/week  PT DURATION: 6 weeks  PLANNED INTERVENTIONS: 97164- PT Re-evaluation, 97110-Therapeutic exercises, 97530- Therapeutic activity, [id number]- Neuromuscular re-education, 97535-  Self Care, [id number]- Manual therapy, [id number]- Gait training, [phone]- Orthotic Fit/training, [phone]- Canalith repositioning, [id number]- Aquatic Therapy, [phone]- Splinting, [phone]- Wound care (first 20 sq cm), 97598- Wound care (each additional 20 sq cm)Patient/Family education, Balance training, Stair training, Taping, Dry Needling, Joint mobilization, Joint manipulation, Spinal manipulation, Spinal mobilization, Scar mobilization, and DME instructions.   PLAN FOR NEXT SESSION: continue to progress ROM, functional strength and gait with LRAD.    8:44 AM, 11/02/23 Rosaria Settler, PT, DPT [facility] [place]

## 2023-11-04 ENCOUNTER — Ambulatory Visit (INDEPENDENT_AMBULATORY_CARE_PROVIDER_SITE_OTHER): Admitting: Physical Therapy

## 2023-11-04 ENCOUNTER — Other Ambulatory Visit: Payer: Self-pay | Admitting: Physician Assistant

## 2023-11-04 ENCOUNTER — Encounter (HOSPITAL_COMMUNITY): Payer: Self-pay

## 2023-11-04 ENCOUNTER — Other Ambulatory Visit: Payer: Self-pay | Admitting: Internal Medicine

## 2023-11-04 ENCOUNTER — Other Ambulatory Visit: Payer: Self-pay

## 2023-11-04 ENCOUNTER — Other Ambulatory Visit (HOSPITAL_COMMUNITY): Payer: Self-pay

## 2023-11-04 DIAGNOSIS — M25661 Stiffness of right knee, not elsewhere classified: Secondary | ICD-10-CM

## 2023-11-04 DIAGNOSIS — R262 Difficulty in walking, not elsewhere classified: Secondary | ICD-10-CM

## 2023-11-04 DIAGNOSIS — Z96651 Presence of right artificial knee joint: Secondary | ICD-10-CM | POA: Diagnosis not present

## 2023-11-04 DIAGNOSIS — M1711 Unilateral primary osteoarthritis, right knee: Secondary | ICD-10-CM

## 2023-11-04 DIAGNOSIS — M25561 Pain in right knee: Secondary | ICD-10-CM | POA: Diagnosis not present

## 2023-11-04 MED ORDER — PANTOPRAZOLE SODIUM 40 MG PO TBEC
40.0000 mg | DELAYED_RELEASE_TABLET | Freq: Every day | ORAL | 3 refills | Status: DC
  Filled 2023-11-04 – 2023-11-08 (×2): qty 90, 90d supply, fill #0

## 2023-11-04 NOTE — Therapy (Signed)
 OUTPATIENT PHYSICAL THERAPY LOWER EXTREMITY TREATMENT Patient Name: Alisha Reed MRN: [id number] DOB:April 10, 1961, 63 y.o., female Today's Date: 11/04/2023  END OF SESSION:  PT End of Session - 11/04/23 1111     Visit Number 7    Number of Visits 12    Date for PT Re-Evaluation 11/26/23    Authorization Type [employer]    PT Start Time [phone]    PT Stop Time 0930    PT Time Calculation (min) 40 min    Activity Tolerance Patient tolerated treatment well    Behavior During Therapy [facility] for tasks assessed/performed            Past Medical History:  Diagnosis Date   Arthritis    hands,    Carpal tunnel syndrome on both sides    both hands surgeries 01/2019   GERD (gastroesophageal reflux disease)    Hypercholesterolemia    Hypothyroidism    Osteopenia 11/08/2018   Thyroid  disease    Past Surgical History:  Procedure Laterality Date   BILATERAL CARPAL TUNNEL RELEASE Bilateral 01/18/2019   Procedure: BILATERAL CARPAL TUNNEL RELEASE;  Surgeon: Jerri Kay HERO, MD;  Location: [facility];  Service: Orthopedics;  Laterality: Bilateral;   BLADDER SUSPENSION N/A 08/10/2022   Procedure: TRANSVAGINAL TAPE (TVT) PROCEDURE;  Surgeon: Marilynne Rosaline SAILOR, MD;  Location: [facility];  Service: Gynecology;  Laterality: N/A;  Total time requested is 1 hour.   CHOLECYSTECTOMY     COLONOSCOPY  2011   CYSTOSCOPY N/A 08/10/2022   Procedure: CYSTOSCOPY;  Surgeon: Marilynne Rosaline SAILOR, MD;  Location: [facility];  Service: Gynecology;  Laterality: N/A;   FOOT SURGERY Bilateral    HYSTERECTOMY ABDOMINAL WITH SALPINGECTOMY     NASAL SINUS SURGERY     PARTIAL KNEE ARTHROPLASTY Right 10/11/2023   Procedure: ARTHROPLASTY, KNEE, UNICOMPARTMENTAL;  Surgeon: Jerri Kay HERO, MD;  Location: [facility];  Service: Orthopedics;  Laterality: Right;  RIGHT KNEE MEDIAL UKA vs TKA   Patient Active Problem List   Diagnosis Date Noted   Status post  right partial knee replacement 10/11/2023   Primary osteoarthritis of right knee 06/29/2023   Right tennis elbow 06/29/2023   Pain in right elbow 01/26/2023   Coronary artery calcification seen on CT scan 01/09/2023   Allergic rhinitis 12/09/2022   Microhematuria 12/09/2022   Rectal bleeding 03/13/2022   Other constipation 03/13/2022   Arthritis 12/03/2021   Stress incontinence 11/19/2020   S/p bilateral carpal tunnel release 03/28/2019   Chronic pain of right knee 12/29/2018   Right carpal tunnel syndrome 12/29/2018   Left carpal tunnel syndrome 12/29/2018   Osteopenia 11/08/2018   Pre-diabetes 05/02/2018   Acute pain of right shoulder 03/18/2018   Hyperlipidemia 11/04/2017   Vitamin D  deficiency 11/04/2017   S/P cholecystectomy 08/02/2017   Skier's thumb, right, subsequent encounter 03/15/2017   Chronic left shoulder pain 02/08/2017   Encounter for well adult exam with abnormal findings 09/24/2016   Hypothyroidism 09/24/2016    PCP: Lynwood Rush, MD  REFERRING PROVIDER: Jerri Kay HERO, MD  REFERRING DIAG: [phone] (ICD-10-CM) - Status post right partial knee replacement  THERAPY DIAG:  Acute pain of right knee  Stiffness of right knee, not elsewhere classified  Difficulty in walking, not elsewhere classified  Primary osteoarthritis of right knee  Rationale for Evaluation and Treatment: Rehabilitation  ONSET DATE: 10/11/23  SUBJECTIVE:   SUBJECTIVE STATEMENT: Pt states she rolled over in the bed the other night and  felt/heard pop at medial knee.  Pt reports it has been hurting bad ever since. Noted antalgia SPC with pain     Evaluation:  Goes by [name]; knee pain for a while; had injections for several years but then stopped helping her.  She saw Dr. Jerri; had surgery 10/11/23; has CPM machine 6 hours a day.    PERTINENT HISTORY: OR nurse PAIN:  Are you having pain? Yes: NPRS scale: 8/10 Pain location: right knee; and down back of calf Pain description: sore, tight,  stabbing and aching Aggravating factors: movement Relieving factors: oxycodone  with acetaminophen , ice man  PRECAUTIONS: None  RED FLAGS: None   WEIGHT BEARING RESTRICTIONS: No  FALLS:  Has patient fallen in last 6 months? No  LIVING ENVIRONMENT: Lives with: lives with their spouse Lives in: House/apartment Stairs: Yes: Internal: 14 steps; on left going up and External: 2 steps; on right going up, on left going up, and bilateral but cannot reach both Has following equipment at home: Vannie - 2 wheeled  OCCUPATION: OR nurse  PLOF: Independent  PATIENT GOALS: go back to work; going to [employer] in 5 weeks[place]  NEXT MD VISIT: 10/26/23  OBJECTIVE:  Note: Objective measures were completed at Evaluation unless otherwise noted.  DIAGNOSTIC FINDINGS:   PATIENT SURVEYS:  LEFS 13/80 Extreme difficulty/unable (0), Quite a bit of difficulty (1), Moderate difficulty (2), Little difficulty (3), No difficulty (4) Survey date:    Any of your usual work, housework or school activities   2. Usual hobbies, recreational or sporting activities   3. Getting into/out of the bath   4. Walking between rooms   5. Putting on socks/shoes   6. Squatting    7. Lifting an object, like a bag of groceries from the floor   8. Performing light activities around your home   9. Performing heavy activities around your home   10. Getting into/out of a car   11. Walking 2 blocks   12. Walking 1 mile   13. Going up/down 10 stairs (1 flight)   14. Standing for 1 hour   15.  sitting for 1 hour   16. Running on even ground   17. Running on uneven ground   18. Making sharp turns while running fast   19. Hopping    20. Rolling over in bed   Score total:  13/80     COGNITION: Overall cognitive status: Within functional limits for tasks assessed     SENSATION: Some numbness outside of lower leg  EDEMA:  Normal for this time s/p partial TKA; bandage in place   POSTURE: rounded shoulders and  forward head  PALPATION: General soreness and redness  LOWER EXTREMITY ROM:  Active ROM Right eval Left eval Right 10/21/23 Right  10/25/23  Hip flexion      Hip extension      Hip abduction      Hip adduction      Hip internal rotation      Hip external rotation      Knee flexion 76 AAROM  88 AAROM with rope 92  Knee extension -8  5 lacking from neutral 3  Ankle dorsiflexion      Ankle plantarflexion      Ankle inversion      Ankle eversion       (Blank rows = not tested)  LOWER EXTREMITY MMT:  MMT Right eval Left eval  Hip flexion    Hip extension    Hip abduction  Hip adduction    Hip internal rotation    Hip external rotation    Knee flexion    Knee extension Poor quad set   Ankle dorsiflexion    Ankle plantarflexion    Ankle inversion    Ankle eversion     (Blank rows = not tested)   FUNCTIONAL TESTS:  5 times sit to stand: 41.01 2 minute walk test: 75 ft with RW  GAIT: Distance walked: 75 ft in clinic Assistive device utilized: Walker - 2 wheeled Level of assistance: SBA Comments: antalgic gait                                                                                                                                TREATMENT DATE:  11/04/23 Gait with SPC 226' Supine:  manual to Rt knee with elevation Gait with SPC working on reducing antalgia; heel to toe 226' Standing:  knee drives 10X on 12 step  Hamstring stretch 3X30 12 step  Heel raises 20X on incline  11/02/23: Gait: 200 ft, LBQC, v cues for heel strike Seated Heel Slides, 3' Seated knee ext w/ foam roll, w/ light OP3'  ROM measurements STS from mat table, 2x10 Knee drives, at staircase, to second step, 2x10 Heel/toe raises, 10x standing, 10x seated Gastroc stretch, 30x2 Hamstring stretch, 2x30  10/28/23: Recumbent Bike, seat 7', 5', half revolutions  Standing: - Knee drive on 87pw step height 5x 10 holds - Heel raises incline slope 10x - TKE RTB 10x 5 - Gait  training with SPC x68ft (cueing for toe push off) - TKE with ball against wall Supine:  -SAQ   - Quad set  - Heel slide  - 4-96 degrees  10/25/23: Recumbent Bike, seat 9', 5', half revolutions  ROM measurement STM to R quad and calf SAQ, 2x10,  SLR, two attempts Seated Heel slides, 2x10 LAQ, 2x10 STS, 10x, tactile feedback at L hip    PATIENT EDUCATION:  Education details: Patient educated on exam findings, POC, scope of PT, HEP, and what to expect in the next few visits. Person educated: Patient Education method: Explanation, Demonstration, and Handouts Education comprehension: verbalized understanding, returned demonstration, verbal cues required, and tactile cues required  HOME EXERCISE PROGRAM: Access Code: [email or web address] URL: [email or web address] Date: 10/14/2023 Prepared by: [facility] - Rehab  Exercises - Supine Ankle Pumps  - 1 x daily - 7 x weekly - 1 sets - 15 reps - Supine Quadricep Sets  - 1 x daily - 7 x weekly - 1 sets - 15 reps - Supine Short Arc Quad  - 1 x daily - 7 x weekly - 1 sets - 15 reps - Supine Heel Slides  - 1 x daily - 7 x weekly - 1 sets - 15 reps - Seated Long Arc Quad  - 1 x daily - 7 x weekly - 1 sets - 15 reps - Seated Knee Flexion Stretch  - 1 x daily -  7 x weekly - 1 sets - 15 reps - Seated Heel Toe Raises  - 1 x daily - 7 x weekly - 1 sets - 15 reps - Supine Heel Slide with Strap  - 1 x daily - 7 x weekly - 1 sets - 10 reps - Long Sitting Calf Stretch with Strap  - 1 x daily - 7 x weekly - 1 sets - 5 reps - 20 sec hold  10/28/23: - Standing Terminal Knee Extension at Wall with Ball  - 2 x daily - 7 x weekly - 1 sets - 10 reps - 5 hold - Heel Toe Raises with Counter Support  - 2 x daily - 7 x weekly - 3 sets - 10 reps  ASSESSMENT:  CLINICAL IMPRESSION: Began session with manual to Rt knee following initial walk around observing antalgic gait. No edema/redness or deformity observed Rt LE and minimal sensitivities while completing  massage. Pt did voice knee felt overall improved following and observed improved ambulation with less antalgia as well.  Pt did require cues to bend knee more with gait and use heel to toe pattern.  Completed only mild stretches remainder of session and no new activities added.  Advised pt to contact MD regarding incident, however believe may have just been scar tissue.  Pt verbalized understanding. Patient will benefit from continued skilled physical therapy in order to address ROM, strength, balance, and endurance to return to PLOF and independence.    Evaluation: Patient is a 63 y.o. female who was seen today for physical therapy evaluation and treatment for Z96.651 (ICD-10-CM) - Status post right partial knee replacement. Patient demonstrates muscle weakness, reduced ROM, and fascial restrictions which are likely contributing to symptoms of pain and are negatively impacting patient ability to perform ADLs and functional mobility tasks. Patient will benefit from skilled physical therapy services to address these deficits to reduce pain and improve level of function with ADLs and functional mobility tasks.   OBJECTIVE IMPAIRMENTS: Abnormal gait, decreased activity tolerance, decreased endurance, decreased mobility, difficulty walking, decreased ROM, decreased strength, increased fascial restrictions, impaired perceived functional ability, and pain.   ACTIVITY LIMITATIONS: carrying, lifting, bending, sitting, standing, squatting, sleeping, stairs, transfers, bed mobility, bathing, toileting, dressing, locomotion level, and caring for others  PARTICIPATION LIMITATIONS: meal prep, cleaning, laundry, driving, shopping, community activity, and occupation    REHAB POTENTIAL: Good  CLINICAL DECISION MAKING: Evolving/moderate complexity  EVALUATION COMPLEXITY: Moderate   GOALS: Goals reviewed with patient? Yes  SHORT TERM GOALS: Target date: 11/04/2023 patient will be independent with initial  HEP  Baseline: Goal status: INITIAL  2.  Patient will report 30% improvement overall  Baseline:  Goal status: INITIAL  3.  Patient will increase right knee mobility to -5 to 90 to promote normal navigation of steps; step over step pattern  Baseline: see above Goal status: INITIAL   LONG TERM GOALS: Target date: 12/06/2023  Patient will be independent in self management strategies to improve quality of life and functional outcomes.  Baseline:  Goal status: INITIAL  2.  Patient will report 50% improvement overall  Baseline:  Goal status: INITIAL  3.  Patient will increase right knee mobility to -2 to 120 to promote normal navigation of steps; step over step pattern  Baseline:  Goal status: INITIAL  4.  Patient will improve LEFS score by 27 points to demonstrate improved perceived function  Baseline: 13/80 Goal status: INITIAL  5.  Patient will increase distance on 2 MWT by 100 ft  with LRAD to demonstrate improved functional gait in community  Baseline: 75 ft with RW Goal status: INITIAL  6.   Patient will increase right leg MMT's to 5/5 to allow navigation of steps without gait deviation or loss of balance  Baseline:  Goal status: INITIAL   PLAN:  PT FREQUENCY: 2x/week  PT DURATION: 6 weeks  PLANNED INTERVENTIONS: 97164- PT Re-evaluation, 97110-Therapeutic exercises, 97530- Therapeutic activity, 97112- Neuromuscular re-education, 97535- Self Care, 02859- Manual therapy, [id number]- Gait training, [phone]- Orthotic Fit/training, [phone]- Canalith repositioning, [id number]- Aquatic Therapy, 97760- Splinting, [phone]- Wound care (first 20 sq cm), 97598- Wound care (each additional 20 sq cm)Patient/Family education, Balance training, Stair training, Taping, Dry Needling, Joint mobilization, Joint manipulation, Spinal manipulation, Spinal mobilization, Scar mobilization, and DME instructions.   PLAN FOR NEXT SESSION: continue to progress ROM, functional strength and gait with  LRAD.    11:12 AM, 11/04/23 Greig KATHEE Fuse, PTA/CLT [facility] Ph: [phone]

## 2023-11-04 NOTE — Telephone Encounter (Signed)
 Should only need for 4 weeks po

## 2023-11-05 ENCOUNTER — Encounter (HOSPITAL_COMMUNITY): Payer: Self-pay

## 2023-11-05 ENCOUNTER — Other Ambulatory Visit (HOSPITAL_COMMUNITY): Payer: Self-pay

## 2023-11-08 ENCOUNTER — Other Ambulatory Visit: Payer: Self-pay

## 2023-11-08 ENCOUNTER — Ambulatory Visit (HOSPITAL_COMMUNITY): Admitting: Physical Therapy

## 2023-11-08 ENCOUNTER — Other Ambulatory Visit (HOSPITAL_COMMUNITY): Payer: Self-pay

## 2023-11-08 DIAGNOSIS — M25561 Pain in right knee: Secondary | ICD-10-CM | POA: Diagnosis not present

## 2023-11-08 DIAGNOSIS — M25661 Stiffness of right knee, not elsewhere classified: Secondary | ICD-10-CM

## 2023-11-08 DIAGNOSIS — M1711 Unilateral primary osteoarthritis, right knee: Secondary | ICD-10-CM

## 2023-11-08 DIAGNOSIS — Z96651 Presence of right artificial knee joint: Secondary | ICD-10-CM | POA: Diagnosis not present

## 2023-11-08 DIAGNOSIS — R262 Difficulty in walking, not elsewhere classified: Secondary | ICD-10-CM

## 2023-11-08 NOTE — Therapy (Signed)
 OUTPATIENT PHYSICAL THERAPY LOWER EXTREMITY TREATMENT Patient Name: Alisha Reed MRN: [id number] DOB:1960/10/10, 63 y.o., female Today's Date: 11/08/2023  END OF SESSION:  PT End of Session - 11/08/23 1013     Visit Number 8    Number of Visits 12    Date for PT Re-Evaluation 11/26/23    Authorization Type [employer]    PT Start Time 0805    PT Stop Time 0845    PT Time Calculation (min) 40 min    Activity Tolerance Patient tolerated treatment well    Behavior During Therapy [facility] for tasks assessed/performed             Past Medical History:  Diagnosis Date   Arthritis    hands,    Carpal tunnel syndrome on both sides    both hands surgeries 01/2019   GERD (gastroesophageal reflux disease)    Hypercholesterolemia    Hypothyroidism    Osteopenia 11/08/2018   Thyroid  disease    Past Surgical History:  Procedure Laterality Date   BILATERAL CARPAL TUNNEL RELEASE Bilateral 01/18/2019   Procedure: BILATERAL CARPAL TUNNEL RELEASE;  Surgeon: Jerri Kay HERO, MD;  Location: [facility];  Service: Orthopedics;  Laterality: Bilateral;   BLADDER SUSPENSION N/A 08/10/2022   Procedure: TRANSVAGINAL TAPE (TVT) PROCEDURE;  Surgeon: Marilynne Rosaline SAILOR, MD;  Location: [facility];  Service: Gynecology;  Laterality: N/A;  Total time requested is 1 hour.   CHOLECYSTECTOMY     COLONOSCOPY  2011   CYSTOSCOPY N/A 08/10/2022   Procedure: CYSTOSCOPY;  Surgeon: Marilynne Rosaline SAILOR, MD;  Location: [facility];  Service: Gynecology;  Laterality: N/A;   FOOT SURGERY Bilateral    HYSTERECTOMY ABDOMINAL WITH SALPINGECTOMY     NASAL SINUS SURGERY     PARTIAL KNEE ARTHROPLASTY Right 10/11/2023   Procedure: ARTHROPLASTY, KNEE, UNICOMPARTMENTAL;  Surgeon: Jerri Kay HERO, MD;  Location: [facility];  Service: Orthopedics;  Laterality: Right;  RIGHT KNEE MEDIAL UKA vs TKA   Patient Active Problem List   Diagnosis Date Noted   Status post  right partial knee replacement 10/11/2023   Primary osteoarthritis of right knee 06/29/2023   Right tennis elbow 06/29/2023   Pain in right elbow 01/26/2023   Coronary artery calcification seen on CT scan 01/09/2023   Allergic rhinitis 12/09/2022   Microhematuria 12/09/2022   Rectal bleeding 03/13/2022   Other constipation 03/13/2022   Arthritis 12/03/2021   Stress incontinence 11/19/2020   S/p bilateral carpal tunnel release 03/28/2019   Chronic pain of right knee 12/29/2018   Right carpal tunnel syndrome 12/29/2018   Left carpal tunnel syndrome 12/29/2018   Osteopenia 11/08/2018   Pre-diabetes 05/02/2018   Acute pain of right shoulder 03/18/2018   Hyperlipidemia 11/04/2017   Vitamin D  deficiency 11/04/2017   S/P cholecystectomy 08/02/2017   Skier's thumb, right, subsequent encounter 03/15/2017   Chronic left shoulder pain 02/08/2017   Encounter for well adult exam with abnormal findings 09/24/2016   Hypothyroidism 09/24/2016    PCP: Lynwood Rush, MD  REFERRING PROVIDER: Jerri Kay HERO, MD  REFERRING DIAG: [phone] (ICD-10-CM) - Status post right partial knee replacement  THERAPY DIAG:  Acute pain of right knee  Stiffness of right knee, not elsewhere classified  Difficulty in walking, not elsewhere classified  Primary osteoarthritis of right knee  Rationale for Evaluation and Treatment: Rehabilitation  ONSET DATE: 10/11/23  SUBJECTIVE:   SUBJECTIVE STATEMENT: Pt states she slept all night for the first time last  night.  Overall doing better and the pain is less. Less antalgic gait SPC and feels more sore than pain now.      Evaluation:  Goes by Alisha Reed; knee pain for a while; had injections for several years but then stopped helping her.  She saw Dr. Jerri; had surgery 10/11/23; has CPM machine 6 hours a day.    PERTINENT HISTORY: OR nurse PAIN:  Are you having pain? Yes: NPRS scale: 8/10 Pain location: right knee; and down back of calf Pain description: sore, tight,  stabbing and aching Aggravating factors: movement Relieving factors: oxycodone  with acetaminophen , ice man  PRECAUTIONS: None  RED FLAGS: None   WEIGHT BEARING RESTRICTIONS: No  FALLS:  Has patient fallen in last 6 months? No  LIVING ENVIRONMENT: Lives with: lives with their spouse Lives in: House/apartment Stairs: Yes: Internal: 14 steps; on left going up and External: 2 steps; on right going up, on left going up, and bilateral but cannot reach both Has following equipment at home: Vannie - 2 wheeled  OCCUPATION: OR nurse  PLOF: Independent  PATIENT GOALS: go back to work; going to [employer] in 5 weeks[place]  NEXT MD VISIT: 10/26/23  OBJECTIVE:  Note: Objective measures were completed at Evaluation unless otherwise noted.  DIAGNOSTIC FINDINGS:   PATIENT SURVEYS:  LEFS 13/80 Extreme difficulty/unable (0), Quite a bit of difficulty (1), Moderate difficulty (2), Little difficulty (3), No difficulty (4) Survey date:    Any of your usual work, housework or school activities   2. Usual hobbies, recreational or sporting activities   3. Getting into/out of the bath   4. Walking between rooms   5. Putting on socks/shoes   6. Squatting    7. Lifting an object, like a bag of groceries from the floor   8. Performing light activities around your home   9. Performing heavy activities around your home   10. Getting into/out of a car   11. Walking 2 blocks   12. Walking 1 mile   13. Going up/down 10 stairs (1 flight)   14. Standing for 1 hour   15.  sitting for 1 hour   16. Running on even ground   17. Running on uneven ground   18. Making sharp turns while running fast   19. Hopping    20. Rolling over in bed   Score total:  13/80     COGNITION: Overall cognitive status: Within functional limits for tasks assessed     SENSATION: Some numbness outside of lower leg  EDEMA:  Normal for this time s/p partial TKA; bandage in place   POSTURE: rounded shoulders and  forward head  PALPATION: General soreness and redness  LOWER EXTREMITY ROM:  Active ROM Right eval Left eval Right 10/21/23 Right  10/25/23 Right 11/08/23  Hip flexion       Hip extension       Hip abduction       Hip adduction       Hip internal rotation       Hip external rotation       Knee flexion 76 AAROM  88 AAROM with rope 92 115 AROM 120   Knee extension -8  5 lacking from neutral 3 0  Ankle dorsiflexion       Ankle plantarflexion       Ankle inversion       Ankle eversion        (Blank rows = not tested)  LOWER EXTREMITY MMT:  MMT Right eval Left eval  Hip flexion    Hip extension    Hip abduction    Hip adduction    Hip internal rotation    Hip external rotation    Knee flexion    Knee extension Poor quad set   Ankle dorsiflexion    Ankle plantarflexion    Ankle inversion    Ankle eversion     (Blank rows = not tested)   FUNCTIONAL TESTS:  5 times sit to stand: 41.01 2 minute walk test: 75 ft with RW  GAIT: Distance walked: 75 ft in clinic Assistive device utilized: Walker - 2 wheeled Level of assistance: SBA Comments: antalgic gait                                                                                                                                TREATMENT DATE:  11/08/23 Standing:  heelraises on incline 20X  Knee flexion 20X knee drives 10X on 12 step  Hamstring stretch 3X30 12 step  Tandem stance  Bike seat 8, full rev 5 minutes Gait without AD 226' Supine:  manual scar release along scar line  AROM 0-115/120  11/04/23 Gait with SPC 226' Supine:  manual to Rt knee with elevation Gait with SPC working on reducing antalgia; heel to toe 226' Standing:  knee drives 10X on 12 step  Hamstring stretch 3X30 12 step  Heel raises 20X on incline  11/02/23: Gait: 200 ft, LBQC, v cues for heel strike Seated Heel Slides, 3' Seated knee ext w/ foam roll, w/ light OP3'  ROM measurements STS from mat table, 2x10 Knee drives,  at staircase, to second step, 2x10 Heel/toe raises, 10x standing, 10x seated Gastroc stretch, 30x2 Hamstring stretch, 2x30  10/28/23: Recumbent Bike, seat 7', 5', half revolutions   PATIENT EDUCATION:  Education details: Patient educated on exam findings, POC, scope of PT, HEP, and what to expect in the next few visits. Person educated: Patient Education method: Explanation, Demonstration, and Handouts Education comprehension: verbalized understanding, returned demonstration, verbal cues required, and tactile cues required  HOME EXERCISE PROGRAM: Access Code: [email or web address] URL: [email or web address] Date: 10/14/2023 Prepared by: [facility] - Rehab  Exercises - Supine Ankle Pumps  - 1 x daily - 7 x weekly - 1 sets - 15 reps - Supine Quadricep Sets  - 1 x daily - 7 x weekly - 1 sets - 15 reps - Supine Short Arc Quad  - 1 x daily - 7 x weekly - 1 sets - 15 reps - Supine Heel Slides  - 1 x daily - 7 x weekly - 1 sets - 15 reps - Seated Long Arc Quad  - 1 x daily - 7 x weekly - 1 sets - 15 reps - Seated Knee Flexion Stretch  - 1 x daily - 7 x weekly - 1 sets - 15 reps - Seated Heel Toe Raises  - 1 x daily - 7 x weekly -  1 sets - 15 reps - Supine Heel Slide with Strap  - 1 x daily - 7 x weekly - 1 sets - 10 reps - Long Sitting Calf Stretch with Strap  - 1 x daily - 7 x weekly - 1 sets - 5 reps - 20 sec hold  10/28/23: - Standing Terminal Knee Extension at Wall with Ball  - 2 x daily - 7 x weekly - 1 sets - 10 reps - 5 hold - Heel Toe Raises with Counter Support  - 2 x daily - 7 x weekly - 3 sets - 10 reps  ASSESSMENT:  CLINICAL IMPRESSION: Much improved today with no pain and scant edema.  Pt ambulating some without SPC with only minimal antalgia.  Pt acutely aware of heel/toe gait and improving knee flexion with ambulation.  Began with bike with ability to make full revolutions today as previously were unable.  Scar release completed with 2 areas, one at medial scar and one  distally with palpable pop.  AROM measurement today revealed ROM to now be WNL at 0-115/120. Pt with plans to go to beach next week. Patient will benefit from continued skilled physical therapy in order to address ROM, strength, balance, and endurance to return to PLOF and independence.    Evaluation: Patient is a 63 y.o. female who was seen today for physical therapy evaluation and treatment for Z96.651 (ICD-10-CM) - Status post right partial knee replacement. Patient demonstrates muscle weakness, reduced ROM, and fascial restrictions which are likely contributing to symptoms of pain and are negatively impacting patient ability to perform ADLs and functional mobility tasks. Patient will benefit from skilled physical therapy services to address these deficits to reduce pain and improve level of function with ADLs and functional mobility tasks.   OBJECTIVE IMPAIRMENTS: Abnormal gait, decreased activity tolerance, decreased endurance, decreased mobility, difficulty walking, decreased ROM, decreased strength, increased fascial restrictions, impaired perceived functional ability, and pain.   ACTIVITY LIMITATIONS: carrying, lifting, bending, sitting, standing, squatting, sleeping, stairs, transfers, bed mobility, bathing, toileting, dressing, locomotion level, and caring for others  PARTICIPATION LIMITATIONS: meal prep, cleaning, laundry, driving, shopping, community activity, and occupation    REHAB POTENTIAL: Good  CLINICAL DECISION MAKING: Evolving/moderate complexity  EVALUATION COMPLEXITY: Moderate   GOALS: Goals reviewed with patient? Yes  SHORT TERM GOALS: Target date: 11/04/2023 patient will be independent with initial HEP  Baseline: Goal status: INITIAL  2.  Patient will report 30% improvement overall  Baseline:  Goal status: INITIAL  3.  Patient will increase right knee mobility to -5 to 90 to promote normal navigation of steps; step over step pattern  Baseline: see  above Goal status: INITIAL   LONG TERM GOALS: Target date: 12/06/2023  Patient will be independent in self management strategies to improve quality of life and functional outcomes.  Baseline:  Goal status: INITIAL  2.  Patient will report 50% improvement overall  Baseline:  Goal status: INITIAL  3.  Patient will increase right knee mobility to -2 to 120 to promote normal navigation of steps; step over step pattern  Baseline:  Goal status: INITIAL  4.  Patient will improve LEFS score by 27 points to demonstrate improved perceived function  Baseline: 13/80 Goal status: INITIAL  5.  Patient will increase distance on 2 MWT by 100 ft with LRAD to demonstrate improved functional gait in community  Baseline: 75 ft with RW Goal status: INITIAL  6.   Patient will increase right leg MMT's to 5/5 to allow navigation  of steps without gait deviation or loss of balance  Baseline:  Goal status: INITIAL   PLAN:  PT FREQUENCY: 2x/week  PT DURATION: 6 weeks  PLANNED INTERVENTIONS: 97164- PT Re-evaluation, 97110-Therapeutic exercises, 97530- Therapeutic activity, 97112- Neuromuscular re-education, 97535- Self Care, [id number]- Manual therapy, [id number]- Gait training, [phone]- Orthotic Fit/training, [phone]- Canalith repositioning, [id number]- Aquatic Therapy, [phone]- Splinting, [phone]- Wound care (first 20 sq cm), 97598- Wound care (each additional 20 sq cm)Patient/Family education, Balance training, Stair training, Taping, Dry Needling, Joint mobilization, Joint manipulation, Spinal manipulation, Spinal mobilization, Scar mobilization, and DME instructions.   PLAN FOR NEXT SESSION: continue to progress ROM, functional strength and gait with LRAD.    10:15 AM, 11/08/23 Greig KATHEE Fuse, PTA/CLT [facility] Ph: [phone]

## 2023-11-10 ENCOUNTER — Other Ambulatory Visit: Payer: Self-pay | Admitting: Physician Assistant

## 2023-11-10 ENCOUNTER — Other Ambulatory Visit (HOSPITAL_COMMUNITY): Payer: Self-pay

## 2023-11-10 MED ORDER — TRAMADOL HCL 50 MG PO TABS
50.0000 mg | ORAL_TABLET | Freq: Two times a day (BID) | ORAL | 2 refills | Status: DC | PRN
  Filled 2023-11-10: qty 30, 15d supply, fill #0

## 2023-11-10 MED ORDER — METHOCARBAMOL 750 MG PO TABS
750.0000 mg | ORAL_TABLET | Freq: Two times a day (BID) | ORAL | 1 refills | Status: DC | PRN
  Filled 2023-11-10: qty 30, 15d supply, fill #0

## 2023-11-11 ENCOUNTER — Encounter (HOSPITAL_COMMUNITY): Payer: Self-pay

## 2023-11-11 ENCOUNTER — Ambulatory Visit (HOSPITAL_COMMUNITY): Attending: Orthopaedic Surgery

## 2023-11-11 ENCOUNTER — Other Ambulatory Visit: Payer: Self-pay | Admitting: Physician Assistant

## 2023-11-11 DIAGNOSIS — M25561 Pain in right knee: Secondary | ICD-10-CM | POA: Diagnosis not present

## 2023-11-11 DIAGNOSIS — M25661 Stiffness of right knee, not elsewhere classified: Secondary | ICD-10-CM | POA: Insufficient documentation

## 2023-11-11 DIAGNOSIS — M1711 Unilateral primary osteoarthritis, right knee: Secondary | ICD-10-CM | POA: Diagnosis not present

## 2023-11-11 DIAGNOSIS — R262 Difficulty in walking, not elsewhere classified: Secondary | ICD-10-CM | POA: Insufficient documentation

## 2023-11-11 MED ORDER — TRAMADOL HCL 50 MG PO TABS: 50.0000 mg | ORAL_TABLET | Freq: Two times a day (BID) | ORAL | 2 refills | Status: DC | PRN

## 2023-11-11 NOTE — Therapy (Signed)
 OUTPATIENT PHYSICAL THERAPY LOWER EXTREMITY TREATMENT Patient Name: Alisha Reed MRN: [id number] DOB:21-May-1960, 63 y.o., female Today's Date: 11/11/2023  END OF SESSION:  PT End of Session - 11/11/23 0847     Visit Number 9    Number of Visits 12    Date for PT Re-Evaluation 11/26/23    Authorization Type [employer]    PT Start Time [phone]    PT Stop Time [phone]    PT Time Calculation (min) 38 min    Activity Tolerance Patient tolerated treatment well    Behavior During Therapy [facility] for tasks assessed/performed              Past Medical History:  Diagnosis Date   Arthritis    hands,    Carpal tunnel syndrome on both sides    both hands surgeries 01/2019   GERD (gastroesophageal reflux disease)    Hypercholesterolemia    Hypothyroidism    Osteopenia 11/08/2018   Thyroid  disease    Past Surgical History:  Procedure Laterality Date   BILATERAL CARPAL TUNNEL RELEASE Bilateral 01/18/2019   Procedure: BILATERAL CARPAL TUNNEL RELEASE;  Surgeon: Jerri Kay HERO, MD;  Location: [facility];  Service: Orthopedics;  Laterality: Bilateral;   BLADDER SUSPENSION N/A 08/10/2022   Procedure: TRANSVAGINAL TAPE (TVT) PROCEDURE;  Surgeon: Marilynne Rosaline SAILOR, MD;  Location: [facility];  Service: Gynecology;  Laterality: N/A;  Total time requested is 1 hour.   CHOLECYSTECTOMY     COLONOSCOPY  2011   CYSTOSCOPY N/A 08/10/2022   Procedure: CYSTOSCOPY;  Surgeon: Marilynne Rosaline SAILOR, MD;  Location: [facility];  Service: Gynecology;  Laterality: N/A;   FOOT SURGERY Bilateral    HYSTERECTOMY ABDOMINAL WITH SALPINGECTOMY     NASAL SINUS SURGERY     PARTIAL KNEE ARTHROPLASTY Right 10/11/2023   Procedure: ARTHROPLASTY, KNEE, UNICOMPARTMENTAL;  Surgeon: Jerri Kay HERO, MD;  Location: [facility];  Service: Orthopedics;  Laterality: Right;  RIGHT KNEE MEDIAL UKA vs TKA   Patient Active Problem List   Diagnosis Date Noted   Status post  right partial knee replacement 10/11/2023   Primary osteoarthritis of right knee 06/29/2023   Right tennis elbow 06/29/2023   Pain in right elbow 01/26/2023   Coronary artery calcification seen on CT scan 01/09/2023   Allergic rhinitis 12/09/2022   Microhematuria 12/09/2022   Rectal bleeding 03/13/2022   Other constipation 03/13/2022   Arthritis 12/03/2021   Stress incontinence 11/19/2020   S/p bilateral carpal tunnel release 03/28/2019   Chronic pain of right knee 12/29/2018   Right carpal tunnel syndrome 12/29/2018   Left carpal tunnel syndrome 12/29/2018   Osteopenia 11/08/2018   Pre-diabetes 05/02/2018   Acute pain of right shoulder 03/18/2018   Hyperlipidemia 11/04/2017   Vitamin D  deficiency 11/04/2017   S/P cholecystectomy 08/02/2017   Skier's thumb, right, subsequent encounter 03/15/2017   Chronic left shoulder pain 02/08/2017   Encounter for well adult exam with abnormal findings 09/24/2016   Hypothyroidism 09/24/2016    PCP: Lynwood Rush, MD  REFERRING PROVIDER: Jerri Kay HERO, MD  REFERRING DIAG: [phone] (ICD-10-CM) - Status post right partial knee replacement  THERAPY DIAG:  Acute pain of right knee  Stiffness of right knee, not elsewhere classified  Primary osteoarthritis of right knee  Rationale for Evaluation and Treatment: Rehabilitation  ONSET DATE: 10/11/23  SUBJECTIVE:   SUBJECTIVE STATEMENT: Pt reporting some right hip soreness, pt is excited to go to beach next week.  Evaluation:  Goes by [name]; knee pain for a while; had injections for several years but then stopped helping her.  She saw Dr. Jerri; had surgery 10/11/23; has CPM machine 6 hours a day.    PERTINENT HISTORY: OR nurse PAIN:  Are you having pain? Yes: NPRS scale: 8/10 Pain location: right knee; and down back of calf Pain description: sore, tight, stabbing and aching Aggravating factors: movement Relieving factors: oxycodone  with acetaminophen , ice man  PRECAUTIONS: None  RED  FLAGS: None   WEIGHT BEARING RESTRICTIONS: No  FALLS:  Has patient fallen in last 6 months? No  LIVING ENVIRONMENT: Lives with: lives with their spouse Lives in: House/apartment Stairs: Yes: Internal: 14 steps; on left going up and External: 2 steps; on right going up, on left going up, and bilateral but cannot reach both Has following equipment at home: Vannie - 2 wheeled  OCCUPATION: OR nurse  PLOF: Independent  PATIENT GOALS: go back to work; going to [employer] in 5 weeks[place]  NEXT MD VISIT: 10/26/23  OBJECTIVE:  Note: Objective measures were completed at Evaluation unless otherwise noted.  DIAGNOSTIC FINDINGS:   PATIENT SURVEYS:  LEFS 13/80 Extreme difficulty/unable (0), Quite a bit of difficulty (1), Moderate difficulty (2), Little difficulty (3), No difficulty (4) Survey date:    Any of your usual work, housework or school activities   2. Usual hobbies, recreational or sporting activities   3. Getting into/out of the bath   4. Walking between rooms   5. Putting on socks/shoes   6. Squatting    7. Lifting an object, like a bag of groceries from the floor   8. Performing light activities around your home   9. Performing heavy activities around your home   10. Getting into/out of a car   11. Walking 2 blocks   12. Walking 1 mile   13. Going up/down 10 stairs (1 flight)   14. Standing for 1 hour   15.  sitting for 1 hour   16. Running on even ground   17. Running on uneven ground   18. Making sharp turns while running fast   19. Hopping    20. Rolling over in bed   Score total:  13/80     COGNITION: Overall cognitive status: Within functional limits for tasks assessed     SENSATION: Some numbness outside of lower leg  EDEMA:  Normal for this time s/p partial TKA; bandage in place   POSTURE: rounded shoulders and forward head  PALPATION: General soreness and redness  LOWER EXTREMITY ROM:  Active ROM Right eval Left eval Right 10/21/23 Right   10/25/23 Right 11/08/23  Hip flexion       Hip extension       Hip abduction       Hip adduction       Hip internal rotation       Hip external rotation       Knee flexion 76 AAROM  88 AAROM with rope 92 115 AROM 120   Knee extension -8  5 lacking from neutral 3 0  Ankle dorsiflexion       Ankle plantarflexion       Ankle inversion       Ankle eversion        (Blank rows = not tested)  LOWER EXTREMITY MMT:  MMT Right eval Left eval  Hip flexion    Hip extension    Hip abduction    Hip adduction  Hip internal rotation    Hip external rotation    Knee flexion    Knee extension Poor quad set   Ankle dorsiflexion    Ankle plantarflexion    Ankle inversion    Ankle eversion     (Blank rows = not tested)   FUNCTIONAL TESTS:  5 times sit to stand: 41.01 2 minute walk test: 75 ft with RW  GAIT: Distance walked: 75 ft in clinic Assistive device utilized: Walker - 2 wheeled Level of assistance: SBA Comments: antalgic gait                                                                                                                                TREATMENT DATE:  11/11/2023  -Recumbent bike seat 8 x 4' -Knee drive on 2nd step with BUE support 10x5'' -Standing HS stretch 2 x 1' -7in step ups and down x 12 with no UE support -7in lateral step ups and down x 12 with no UE support -Stiff-legged deadlift x 12 w/ 10lb KB -Standing on RLE hip abduction with GTB at knees x 15 w/ 3'' -2in eccentric step down x 12   11/08/23 Standing:  heelraises on incline 20X  Knee flexion 20X knee drives 10X on 12 step  Hamstring stretch 3X30 12 step  Tandem stance  Bike seat 8, full rev 5 minutes Gait without AD 226' Supine:  manual scar release along scar line  AROM 0-115/120  11/04/23 Gait with SPC 226' Supine:  manual to Rt knee with elevation Gait with SPC working on reducing antalgia; heel to toe 226' Standing:  knee drives 10X on 12 step  Hamstring stretch 3X30  12 step  Heel raises 20X on incline   PATIENT EDUCATION:  Education details: Patient educated on exam findings, POC, scope of PT, HEP, and what to expect in the next few visits. Person educated: Patient Education method: Explanation, Demonstration, and Handouts Education comprehension: verbalized understanding, returned demonstration, verbal cues required, and tactile cues required  HOME EXERCISE PROGRAM: Access Code: [email or web address] URL: [email or web address] Date: 10/14/2023 Prepared by: [facility] - Rehab  Exercises - Supine Ankle Pumps  - 1 x daily - 7 x weekly - 1 sets - 15 reps - Supine Quadricep Sets  - 1 x daily - 7 x weekly - 1 sets - 15 reps - Supine Short Arc Quad  - 1 x daily - 7 x weekly - 1 sets - 15 reps - Supine Heel Slides  - 1 x daily - 7 x weekly - 1 sets - 15 reps - Seated Long Arc Quad  - 1 x daily - 7 x weekly - 1 sets - 15 reps - Seated Knee Flexion Stretch  - 1 x daily - 7 x weekly - 1 sets - 15 reps - Seated Heel Toe Raises  - 1 x daily - 7 x weekly - 1 sets - 15 reps - Supine Heel Slide with Strap  -  1 x daily - 7 x weekly - 1 sets - 10 reps - Long Sitting Calf Stretch with Strap  - 1 x daily - 7 x weekly - 1 sets - 5 reps - 20 sec hold  10/28/23: - Standing Terminal Knee Extension at Wall with Ball  - 2 x daily - 7 x weekly - 1 sets - 10 reps - 5 hold - Heel Toe Raises with Counter Support  - 2 x daily - 7 x weekly - 3 sets - 10 reps  ASSESSMENT:  CLINICAL IMPRESSION: Pt tolerating therapy session well. Addressed functional challenges for more stair negotiation to prepare for going down the beach. Pt with continued great motion in R knee. Pt with poor eccentric control when stepping down on 2in step with focus on eccentric/slow movement down. Pt will benefit from skilled Physical Therapy services to address deficits/limitations in order to improve functional and QOL.    Evaluation: Patient is a 63 y.o. female who was seen today for physical therapy  evaluation and treatment for Z96.651 (ICD-10-CM) - Status post right partial knee replacement. Patient demonstrates muscle weakness, reduced ROM, and fascial restrictions which are likely contributing to symptoms of pain and are negatively impacting patient ability to perform ADLs and functional mobility tasks. Patient will benefit from skilled physical therapy services to address these deficits to reduce pain and improve level of function with ADLs and functional mobility tasks.   OBJECTIVE IMPAIRMENTS: Abnormal gait, decreased activity tolerance, decreased endurance, decreased mobility, difficulty walking, decreased ROM, decreased strength, increased fascial restrictions, impaired perceived functional ability, and pain.   ACTIVITY LIMITATIONS: carrying, lifting, bending, sitting, standing, squatting, sleeping, stairs, transfers, bed mobility, bathing, toileting, dressing, locomotion level, and caring for others  PARTICIPATION LIMITATIONS: meal prep, cleaning, laundry, driving, shopping, community activity, and occupation    REHAB POTENTIAL: Good  CLINICAL DECISION MAKING: Evolving/moderate complexity  EVALUATION COMPLEXITY: Moderate   GOALS: Goals reviewed with patient? Yes  SHORT TERM GOALS: Target date: 11/04/2023 patient will be independent with initial HEP  Baseline: Goal status: INITIAL  2.  Patient will report 30% improvement overall  Baseline:  Goal status: INITIAL  3.  Patient will increase right knee mobility to -5 to 90 to promote normal navigation of steps; step over step pattern  Baseline: see above Goal status: INITIAL   LONG TERM GOALS: Target date: 12/06/2023  Patient will be independent in self management strategies to improve quality of life and functional outcomes.  Baseline:  Goal status: INITIAL  2.  Patient will report 50% improvement overall  Baseline:  Goal status: INITIAL  3.  Patient will increase right knee mobility to -2 to 120 to promote  normal navigation of steps; step over step pattern  Baseline:  Goal status: INITIAL  4.  Patient will improve LEFS score by 27 points to demonstrate improved perceived function  Baseline: 13/80 Goal status: INITIAL  5.  Patient will increase distance on 2 MWT by 100 ft with LRAD to demonstrate improved functional gait in community  Baseline: 75 ft with RW Goal status: INITIAL  6.   Patient will increase right leg MMT's to 5/5 to allow navigation of steps without gait deviation or loss of balance  Baseline:  Goal status: INITIAL   PLAN:  PT FREQUENCY: 2x/week  PT DURATION: 6 weeks  PLANNED INTERVENTIONS: 97164- PT Re-evaluation, 97110-Therapeutic exercises, 97530- Therapeutic activity, [id number]- Neuromuscular re-education, 97535- Self Care, 02859- Manual therapy, [id number]- Gait training, [phone]- Orthotic Fit/training, [phone]- Canalith repositioning, [id number]- Aquatic Therapy,  02239- Splinting, 02402- Wound care (first 20 sq cm), 97598- Wound care (each additional 20 sq cm)Patient/Family education, Balance training, Stair training, Taping, Dry Needling, Joint mobilization, Joint manipulation, Spinal manipulation, Spinal mobilization, Scar mobilization, and DME instructions.   PLAN FOR NEXT SESSION: progress note next session* continue to progress ROM, functional strength and gait with LRAD.    9:26 AM, 11/11/23 Omega JONETTA Bottcher PT, DPT [facility] [place] [address] [phone] office

## 2023-11-15 ENCOUNTER — Ambulatory Visit (HOSPITAL_COMMUNITY)

## 2023-11-15 ENCOUNTER — Encounter (HOSPITAL_COMMUNITY): Payer: Self-pay

## 2023-11-15 DIAGNOSIS — M25561 Pain in right knee: Secondary | ICD-10-CM | POA: Diagnosis not present

## 2023-11-15 DIAGNOSIS — R262 Difficulty in walking, not elsewhere classified: Secondary | ICD-10-CM | POA: Diagnosis not present

## 2023-11-15 DIAGNOSIS — M1711 Unilateral primary osteoarthritis, right knee: Secondary | ICD-10-CM | POA: Diagnosis not present

## 2023-11-15 DIAGNOSIS — M25661 Stiffness of right knee, not elsewhere classified: Secondary | ICD-10-CM

## 2023-11-15 NOTE — Therapy (Signed)
 OUTPATIENT PHYSICAL THERAPY LOWER EXTREMITY TREATMENT Progress Note Reporting Period 10/14/2023 to 12/06/2023  See note below for Objective Data and Assessment of Progress/Goals.     Patient Name: Alisha Reed MRN: [id number] DOB:Jul 08, 1960, 63 y.o., female Today's Date: 11/15/2023  END OF SESSION:  PT End of Session - 11/15/23 0836     Visit Number 10    Number of Visits 18    Date for PT Re-Evaluation 11/26/23    Authorization Type [employer]    Progress Note Due on Visit 18    PT Start Time 0802    PT Stop Time [phone]    PT Time Calculation (min) 41 min    Activity Tolerance Patient tolerated treatment well    Behavior During Therapy [facility] for tasks assessed/performed            Past Medical History:  Diagnosis Date   Arthritis    hands,    Carpal tunnel syndrome on both sides    both hands surgeries 01/2019   GERD (gastroesophageal reflux disease)    Hypercholesterolemia    Hypothyroidism    Osteopenia 11/08/2018   Thyroid  disease    Past Surgical History:  Procedure Laterality Date   BILATERAL CARPAL TUNNEL RELEASE Bilateral 01/18/2019   Procedure: BILATERAL CARPAL TUNNEL RELEASE;  Surgeon: Jerri Kay HERO, MD;  Location: [facility];  Service: Orthopedics;  Laterality: Bilateral;   BLADDER SUSPENSION N/A 08/10/2022   Procedure: TRANSVAGINAL TAPE (TVT) PROCEDURE;  Surgeon: Marilynne Rosaline SAILOR, MD;  Location: [facility];  Service: Gynecology;  Laterality: N/A;  Total time requested is 1 hour.   CHOLECYSTECTOMY     COLONOSCOPY  2011   CYSTOSCOPY N/A 08/10/2022   Procedure: CYSTOSCOPY;  Surgeon: Marilynne Rosaline SAILOR, MD;  Location: [facility];  Service: Gynecology;  Laterality: N/A;   FOOT SURGERY Bilateral    HYSTERECTOMY ABDOMINAL WITH SALPINGECTOMY     NASAL SINUS SURGERY     PARTIAL KNEE ARTHROPLASTY Right 10/11/2023   Procedure: ARTHROPLASTY, KNEE, UNICOMPARTMENTAL;  Surgeon: Jerri Kay HERO,  MD;  Location: [facility];  Service: Orthopedics;  Laterality: Right;  RIGHT KNEE MEDIAL UKA vs TKA   Patient Active Problem List   Diagnosis Date Noted   Status post right partial knee replacement 10/11/2023   Primary osteoarthritis of right knee 06/29/2023   Right tennis elbow 06/29/2023   Pain in right elbow 01/26/2023   Coronary artery calcification seen on CT scan 01/09/2023   Allergic rhinitis 12/09/2022   Microhematuria 12/09/2022   Rectal bleeding 03/13/2022   Other constipation 03/13/2022   Arthritis 12/03/2021   Stress incontinence 11/19/2020   S/p bilateral carpal tunnel release 03/28/2019   Chronic pain of right knee 12/29/2018   Right carpal tunnel syndrome 12/29/2018   Left carpal tunnel syndrome 12/29/2018   Osteopenia 11/08/2018   Pre-diabetes 05/02/2018   Acute pain of right shoulder 03/18/2018   Hyperlipidemia 11/04/2017   Vitamin D  deficiency 11/04/2017   S/P cholecystectomy 08/02/2017   Skier's thumb, right, subsequent encounter 03/15/2017   Chronic left shoulder pain 02/08/2017   Encounter for well adult exam with abnormal findings 09/24/2016   Hypothyroidism 09/24/2016    PCP: Lynwood Rush, MD  REFERRING PROVIDER: Jerri Kay HERO, MD  REFERRING DIAG: [id number] (ICD-10-CM) - Status post right partial knee replacement  THERAPY DIAG:  Acute pain of right knee  Stiffness of right knee, not elsewhere classified  Primary osteoarthritis of right knee  Rationale for Evaluation and  Treatment: Rehabilitation  ONSET DATE: 10/11/23  SUBJECTIVE:   SUBJECTIVE STATEMENT: Pt reporting irritation over the weekend with knee pain. No cane this am.   Evaluation:  Goes by [name]; knee pain for a while; had injections for several years but then stopped helping her.  She saw Dr. Jerri; had surgery 10/11/23; has CPM machine 6 hours a day.    PERTINENT HISTORY: OR nurse PAIN:  Are you having pain? Yes: NPRS scale: 8/10 Pain location: right knee; and down back of calf Pain  description: sore, tight, stabbing and aching Aggravating factors: movement Relieving factors: oxycodone  with acetaminophen , ice man  PRECAUTIONS: None  RED FLAGS: None   WEIGHT BEARING RESTRICTIONS: No  FALLS:  Has patient fallen in last 6 months? No  LIVING ENVIRONMENT: Lives with: lives with their spouse Lives in: House/apartment Stairs: Yes: Internal: 14 steps; on left going up and External: 2 steps; on right going up, on left going up, and bilateral but cannot reach both Has following equipment at home: Vannie - 2 wheeled  OCCUPATION: OR nurse  PLOF: Independent  PATIENT GOALS: go back to work; going to [employer] in 5 weeks[place]  NEXT MD VISIT: 10/26/23  OBJECTIVE:  Note: Objective measures were completed at Evaluation unless otherwise noted.  DIAGNOSTIC FINDINGS:   PATIENT SURVEYS:  LEFS 13/80 Extreme difficulty/unable (0), Quite a bit of difficulty (1), Moderate difficulty (2), Little difficulty (3), No difficulty (4) Survey date:    Any of your usual work, housework or school activities   2. Usual hobbies, recreational or sporting activities   3. Getting into/out of the bath   4. Walking between rooms   5. Putting on socks/shoes   6. Squatting    7. Lifting an object, like a bag of groceries from the floor   8. Performing light activities around your home   9. Performing heavy activities around your home   10. Getting into/out of a car   11. Walking 2 blocks   12. Walking 1 mile   13. Going up/down 10 stairs (1 flight)   14. Standing for 1 hour   15.  sitting for 1 hour   16. Running on even ground   17. Running on uneven ground   18. Making sharp turns while running fast   19. Hopping    20. Rolling over in bed   Score total:  13/80     COGNITION: Overall cognitive status: Within functional limits for tasks assessed     SENSATION: Some numbness outside of lower leg  EDEMA:  Normal for this time s/p partial TKA; bandage in  place   POSTURE: rounded shoulders and forward head  PALPATION: General soreness and redness  LOWER EXTREMITY ROM:  Active ROM Right eval Left eval Right 10/21/23 Right  10/25/23 Right 11/08/23 Right 11/15/2023  Hip flexion        Hip extension        Hip abduction        Hip adduction        Hip internal rotation        Hip external rotation        Knee flexion 76 AAROM  88 AAROM with rope 92 115 AROM 120  115  Knee extension -8  5 lacking from neutral 3 0 0  Ankle dorsiflexion        Ankle plantarflexion        Ankle inversion        Ankle eversion         (  Blank rows = not tested)  LOWER EXTREMITY MMT:  MMT Right eval Left eval Right 11/15/2023 Left 11/15/2023  Hip flexion   3- 4-  Hip extension      Hip abduction      Hip adduction      Hip internal rotation      Hip external rotation      Knee flexion   3+ 4  Knee extension Poor quad set  3+ 5  Ankle dorsiflexion      Ankle plantarflexion      Ankle inversion      Ankle eversion       (Blank rows = not tested)   FUNCTIONAL TESTS:  5 times sit to stand: 41.01 2 minute walk test: 75 ft with RW  GAIT: Distance walked: 75 ft in clinic Assistive device utilized: Walker - 2 wheeled Level of assistance: SBA Comments: antalgic gait                                                                                                                                TREATMENT DATE:  11/15/2023  -LEFS: 59 / 80 = 73.8 % -ROM -MMT -5x STS: 15.03 seconds - : 328ft -4in eccentric step downs with BUE support 2 x 10 -Sidelying hip adduction x 12 w/ 3'' -LAQ with 7.5 ankle weight x 10 with 3'' eccentric  11/11/2023  -Recumbent bike seat 8 x 4' -Knee drive on 2nd step with BUE support 10x5'' -Standing HS stretch 2 x 1' -7in step ups and down x 12 with no UE support -7in lateral step ups and down x 12 with no UE support -Stiff-legged deadlift x 12 w/ 10lb KB -Standing on RLE hip abduction with GTB at knees x 15 w/  3'' -2in eccentric step down x 12   11/08/23 Standing:  heelraises on incline 20X  Knee flexion 20X knee drives 10X on 12 step  Hamstring stretch 3X30 12 step  Tandem stance  Bike seat 8, full rev 5 minutes Gait without AD 226' Supine:  manual scar release along scar line  AROM 0-115/120   PATIENT EDUCATION:  Education details: Patient educated on exam findings, POC, scope of PT, HEP, and what to expect in the next few visits. Person educated: Patient Education method: Explanation, Demonstration, and Handouts Education comprehension: verbalized understanding, returned demonstration, verbal cues required, and tactile cues required  HOME EXERCISE PROGRAM: Access Code: [email or web address] URL: [email or web address] Date: 10/14/2023 Prepared by: [facility] - Rehab  Exercises - Supine Ankle Pumps  - 1 x daily - 7 x weekly - 1 sets - 15 reps - Supine Quadricep Sets  - 1 x daily - 7 x weekly - 1 sets - 15 reps - Supine Short Arc Quad  - 1 x daily - 7 x weekly - 1 sets - 15 reps - Supine Heel Slides  - 1 x daily - 7 x weekly - 1 sets - 15 reps - Seated Long  Arc Quad  - 1 x daily - 7 x weekly - 1 sets - 15 reps - Seated Knee Flexion Stretch  - 1 x daily - 7 x weekly - 1 sets - 15 reps - Seated Heel Toe Raises  - 1 x daily - 7 x weekly - 1 sets - 15 reps - Supine Heel Slide with Strap  - 1 x daily - 7 x weekly - 1 sets - 10 reps - Long Sitting Calf Stretch with Strap  - 1 x daily - 7 x weekly - 1 sets - 5 reps - 20 sec hold  10/28/23: - Standing Terminal Knee Extension at Wall with Ball  - 2 x daily - 7 x weekly - 1 sets - 10 reps - 5 hold - Heel Toe Raises with Counter Support  - 2 x daily - 7 x weekly - 3 sets - 10 reps  11/15/2023 Access Code: [email or web address] URL: [email or web address] Date: 11/15/2023 Prepared by: Omega Bottcher  Exercises - Sidelying Hip Adduction  - 1 x daily - 7 x weekly - 3 sets - 10 reps - Supine Hip Adduction Isometric with Ball  - 1 x daily - 7 x  weekly - 3 sets - 10 reps - 10 hold  ASSESSMENT:  CLINICAL IMPRESSION: Progress note completed today. Pt has progressed significantly in functional outcome measures. MMT continues to show deficits and limitations which limit pt's gait cycle, functional mobility and pain. Pt continues with medial knee pain that presents like neural pain, try various pain modalities to reduce pain and improve function. Pt continues with asymmetry during funcitonal squats as well. Pt will benefit from continued skilled Physical Therapy services for 2x/ 4 week  to address deficits/limitations in order to improve functional and QOL.    Evaluation: Patient is a 63 y.o. female who was seen today for physical therapy evaluation and treatment for Z96.651 (ICD-10-CM) - Status post right partial knee replacement. Patient demonstrates muscle weakness, reduced ROM, and fascial restrictions which are likely contributing to symptoms of pain and are negatively impacting patient ability to perform ADLs and functional mobility tasks. Patient will benefit from skilled physical therapy services to address these deficits to reduce pain and improve level of function with ADLs and functional mobility tasks.   OBJECTIVE IMPAIRMENTS: Abnormal gait, decreased activity tolerance, decreased endurance, decreased mobility, difficulty walking, decreased ROM, decreased strength, increased fascial restrictions, impaired perceived functional ability, and pain.   ACTIVITY LIMITATIONS: carrying, lifting, bending, sitting, standing, squatting, sleeping, stairs, transfers, bed mobility, bathing, toileting, dressing, locomotion level, and caring for others  PARTICIPATION LIMITATIONS: meal prep, cleaning, laundry, driving, shopping, community activity, and occupation    REHAB POTENTIAL: Good  CLINICAL DECISION MAKING: Evolving/moderate complexity  EVALUATION COMPLEXITY: Moderate   GOALS: Goals reviewed with patient? Yes  SHORT TERM GOALS:  Target date: 11/04/2023 patient will be independent with initial HEP  Baseline: Goal status: IN PROGRESS  2.  Patient will report 30% improvement overall  Baseline:  Goal status: MET  3.  Patient will increase right knee mobility to -5 to 90 to promote normal navigation of steps; step over step pattern  Baseline: see above Goal status: MET   LONG TERM GOALS: Target date: 12/06/2023  Patient will be independent in self management strategies to improve quality of life and functional outcomes.  Baseline:  Goal status: IN PROGRESS  2.  Patient will report 50% improvement overall  Baseline:  Goal status: IN PROGRESS  3.  Patient will increase right knee mobility  to -2 to 120 to promote normal navigation of steps; step over step pattern  Baseline:  Goal status: IN PROGRESS  4.  Patient will improve LEFS score by 27 points to demonstrate improved perceived function  Baseline: 13/80 Goal status: MET  5.  Patient will increase distance on 2 MWT by 100 ft with LRAD to demonstrate improved functional gait in community  Baseline: 75 ft with RW Goal status: MET  6.   Patient will increase right leg MMT's to 5/5 to allow navigation of steps without gait deviation or loss of balance  Baseline:  Goal status: in progress   PLAN:  PT FREQUENCY: 2x/week  PT DURATION: 6 weeks  PLANNED INTERVENTIONS: 97164- PT Re-evaluation, 97110-Therapeutic exercises, 97530- Therapeutic activity, 97112- Neuromuscular re-education, 97535- Self Care, [id number]- Manual therapy, [id number]- Gait training, [phone]- Orthotic Fit/training, [phone]- Canalith repositioning, [id number]- Aquatic Therapy, 97760- Splinting, [phone]- Wound care (first 20 sq cm), 97598- Wound care (each additional 20 sq cm)Patient/Family education, Balance training, Stair training, Taping, Dry Needling, Joint mobilization, Joint manipulation, Spinal manipulation, Spinal mobilization, Scar mobilization, and DME instructions.   PLAN FOR NEXT  SESSION: focus strengthening on RLE with emphasis on hamstring, adductors and quadriceps 2 x/ 4 weeks  Omega JONETTA Donna ALMETA, DPT [facility] [place] [phone] office  8:44 AM, 11/15/23

## 2023-11-18 ENCOUNTER — Encounter: Payer: Self-pay | Admitting: Internal Medicine

## 2023-11-18 ENCOUNTER — Encounter (HOSPITAL_COMMUNITY)

## 2023-11-22 ENCOUNTER — Encounter (HOSPITAL_COMMUNITY)

## 2023-11-25 ENCOUNTER — Encounter (HOSPITAL_COMMUNITY)

## 2023-11-29 ENCOUNTER — Ambulatory Visit (HOSPITAL_COMMUNITY)

## 2023-11-29 DIAGNOSIS — M25561 Pain in right knee: Secondary | ICD-10-CM | POA: Diagnosis not present

## 2023-11-29 DIAGNOSIS — M1711 Unilateral primary osteoarthritis, right knee: Secondary | ICD-10-CM

## 2023-11-29 DIAGNOSIS — R262 Difficulty in walking, not elsewhere classified: Secondary | ICD-10-CM

## 2023-11-29 DIAGNOSIS — M25661 Stiffness of right knee, not elsewhere classified: Secondary | ICD-10-CM

## 2023-11-29 NOTE — Therapy (Signed)
 OUTPATIENT PHYSICAL THERAPY LOWER EXTREMITY TREATMENT   Patient Name: Alisha Reed MRN: [id number] DOB:Sep 11, 1960, 63 y.o., female Today's Date: 11/29/2023  END OF SESSION:  PT End of Session - 11/29/23 0803     Visit Number 11    Number of Visits 18    Date for PT Re-Evaluation 12/13/23    Authorization Type [employer]    Progress Note Due on Visit 18    PT Start Time 0803    PT Stop Time [phone]    PT Time Calculation (min) 40 min    Activity Tolerance Patient tolerated treatment well    Behavior During Therapy [facility] for tasks assessed/performed            Past Medical History:  Diagnosis Date   Arthritis    hands,    Carpal tunnel syndrome on both sides    both hands surgeries 01/2019   GERD (gastroesophageal reflux disease)    Hypercholesterolemia    Hypothyroidism    Osteopenia 11/08/2018   Thyroid  disease    Past Surgical History:  Procedure Laterality Date   BILATERAL CARPAL TUNNEL RELEASE Bilateral 01/18/2019   Procedure: BILATERAL CARPAL TUNNEL RELEASE;  Surgeon: Jerri Kay HERO, MD;  Location: [facility];  Service: Orthopedics;  Laterality: Bilateral;   BLADDER SUSPENSION N/A 08/10/2022   Procedure: TRANSVAGINAL TAPE (TVT) PROCEDURE;  Surgeon: Marilynne Rosaline SAILOR, MD;  Location: [facility];  Service: Gynecology;  Laterality: N/A;  Total time requested is 1 hour.   CHOLECYSTECTOMY     COLONOSCOPY  2011   CYSTOSCOPY N/A 08/10/2022   Procedure: CYSTOSCOPY;  Surgeon: Marilynne Rosaline SAILOR, MD;  Location: [facility];  Service: Gynecology;  Laterality: N/A;   FOOT SURGERY Bilateral    HYSTERECTOMY ABDOMINAL WITH SALPINGECTOMY     NASAL SINUS SURGERY     PARTIAL KNEE ARTHROPLASTY Right 10/11/2023   Procedure: ARTHROPLASTY, KNEE, UNICOMPARTMENTAL;  Surgeon: Jerri Kay HERO, MD;  Location: [facility];  Service: Orthopedics;  Laterality: Right;  RIGHT KNEE MEDIAL UKA vs TKA   Patient Active Problem List    Diagnosis Date Noted   Status post right partial knee replacement 10/11/2023   Primary osteoarthritis of right knee 06/29/2023   Right tennis elbow 06/29/2023   Pain in right elbow 01/26/2023   Coronary artery calcification seen on CT scan 01/09/2023   Allergic rhinitis 12/09/2022   Microhematuria 12/09/2022   Rectal bleeding 03/13/2022   Other constipation 03/13/2022   Arthritis 12/03/2021   Stress incontinence 11/19/2020   S/p bilateral carpal tunnel release 03/28/2019   Chronic pain of right knee 12/29/2018   Right carpal tunnel syndrome 12/29/2018   Left carpal tunnel syndrome 12/29/2018   Osteopenia 11/08/2018   Pre-diabetes 05/02/2018   Acute pain of right shoulder 03/18/2018   Hyperlipidemia 11/04/2017   Vitamin D  deficiency 11/04/2017   S/P cholecystectomy 08/02/2017   Skier's thumb, right, subsequent encounter 03/15/2017   Chronic left shoulder pain 02/08/2017   Encounter for well adult exam with abnormal findings 09/24/2016   Hypothyroidism 09/24/2016    PCP: Lynwood Rush, MD  REFERRING PROVIDER: Jerri Kay HERO, MD  REFERRING DIAG: [phone] (ICD-10-CM) - Status post right partial knee replacement  THERAPY DIAG:  Acute pain of right knee  Stiffness of right knee, not elsewhere classified  Primary osteoarthritis of right knee  Difficulty in walking, not elsewhere classified  Rationale for Evaluation and Treatment: Rehabilitation  ONSET DATE: 10/11/23  SUBJECTIVE:   SUBJECTIVE STATEMENT: 6-7/10  pain today.  Did fall back and get a good knee stretch 2 weeks ago; was able to go to the beach last week   Evaluation:  Goes by [name]; knee pain for a while; had injections for several years but then stopped helping her.  She saw Dr. Jerri; had surgery 10/11/23; has CPM machine 6 hours a day.    PERTINENT HISTORY: OR nurse PAIN:  Are you having pain? Yes: NPRS scale: 8/10 Pain location: right knee; and down back of calf Pain description: sore, tight, stabbing and  aching Aggravating factors: movement Relieving factors: oxycodone  with acetaminophen , ice man  PRECAUTIONS: None  RED FLAGS: None   WEIGHT BEARING RESTRICTIONS: No  FALLS:  Has patient fallen in last 6 months? No  LIVING ENVIRONMENT: Lives with: lives with their spouse Lives in: House/apartment Stairs: Yes: Internal: 14 steps; on left going up and External: 2 steps; on right going up, on left going up, and bilateral but cannot reach both Has following equipment at home: Vannie - 2 wheeled  OCCUPATION: OR nurse  PLOF: Independent  PATIENT GOALS: go back to work; going to [employer] in 5 weeks; [place]  NEXT MD VISIT: 10/26/23  OBJECTIVE:  Note: Objective measures were completed at Evaluation unless otherwise noted.  DIAGNOSTIC FINDINGS:   PATIENT SURVEYS:  LEFS 13/80 Extreme difficulty/unable (0), Quite a bit of difficulty (1), Moderate difficulty (2), Little difficulty (3), No difficulty (4) Survey date:    Any of your usual work, housework or school activities   2. Usual hobbies, recreational or sporting activities   3. Getting into/out of the bath   4. Walking between rooms   5. Putting on socks/shoes   6. Squatting    7. Lifting an object, like a bag of groceries from the floor   8. Performing light activities around your home   9. Performing heavy activities around your home   10. Getting into/out of a car   11. Walking 2 blocks   12. Walking 1 mile   13. Going up/down 10 stairs (1 flight)   14. Standing for 1 hour   15.  sitting for 1 hour   16. Running on even ground   17. Running on uneven ground   18. Making sharp turns while running fast   19. Hopping    20. Rolling over in bed   Score total:  13/80     COGNITION: Overall cognitive status: Within functional limits for tasks assessed     SENSATION: Some numbness outside of lower leg  EDEMA:  Normal for this time s/p partial TKA; bandage in place   POSTURE: rounded shoulders and forward  head  PALPATION: General soreness and redness  LOWER EXTREMITY ROM:  Active ROM Right eval Left eval Right 10/21/23 Right  10/25/23 Right 11/08/23 Right 11/15/2023  Hip flexion        Hip extension        Hip abduction        Hip adduction        Hip internal rotation        Hip external rotation        Knee flexion 76 AAROM  88 AAROM with rope 92 115 AROM 120  115  Knee extension -8  5 lacking from neutral 3 0 0  Ankle dorsiflexion        Ankle plantarflexion        Ankle inversion        Ankle eversion         (  Blank rows = not tested)  LOWER EXTREMITY MMT:  MMT Right eval Left eval Right 11/15/2023 Left 11/15/2023  Hip flexion   3- 4-  Hip extension      Hip abduction      Hip adduction      Hip internal rotation      Hip external rotation      Knee flexion   3+ 4  Knee extension Poor quad set  3+ 5  Ankle dorsiflexion      Ankle plantarflexion      Ankle inversion      Ankle eversion       (Blank rows = not tested)   FUNCTIONAL TESTS:  5 times sit to stand: 41.01 2 minute walk test: 75 ft with RW  GAIT: Distance walked: 75 ft in clinic Assistive device utilized: Walker - 2 wheeled Level of assistance: SBA Comments: antalgic gait                                                                                                                                TREATMENT DATE:  11/29/23 STM to right knee followed by manual ROM to the same x 15' to decrease pain and improve soft tissue extensibility Instruction in foam rolling right hamstring Bike seat 9 x 5' full revolutions  12 box hamstring stretch 10 x 10' Cybex hamstring curls 2 plates 2 x 10 both concentric and right only eccentric    11/15/2023  -LEFS: 59 / 80 = 73.8 % -ROM -MMT -5x STS: 15.03 seconds - : 372ft -4in eccentric step downs with BUE support 2 x 10 -Sidelying hip adduction x 12 w/ 3'' -LAQ with 7.5 ankle weight x 10 with 3'' eccentric  11/11/2023  -Recumbent bike seat 8 x  4' -Knee drive on 2nd step with BUE support 10x5'' -Standing HS stretch 2 x 1' -7in step ups and down x 12 with no UE support -7in lateral step ups and down x 12 with no UE support -Stiff-legged deadlift x 12 w/ 10lb KB -Standing on RLE hip abduction with GTB at knees x 15 w/ 3'' -2in eccentric step down x 12   11/08/23 Standing:  heelraises on incline 20X  Knee flexion 20X knee drives 10X on 12 step  Hamstring stretch 3X30 12 step  Tandem stance  Bike seat 8, full rev 5 minutes Gait without AD 226' Supine:  manual scar release along scar line  AROM 0-115/120   PATIENT EDUCATION:  Education details: Patient educated on exam findings, POC, scope of PT, HEP, and what to expect in the next few visits. Person educated: Patient Education method: Explanation, Demonstration, and Handouts Education comprehension: verbalized understanding, returned demonstration, verbal cues required, and tactile cues required  HOME EXERCISE PROGRAM: Access Code: [email or web address] URL: [email or web address] Date: 10/14/2023 Prepared by: [facility] - Rehab  Exercises - Supine Ankle Pumps  - 1 x daily - 7 x weekly - 1 sets - 15 reps -  Supine Quadricep Sets  - 1 x daily - 7 x weekly - 1 sets - 15 reps - Supine Short Arc Quad  - 1 x daily - 7 x weekly - 1 sets - 15 reps - Supine Heel Slides  - 1 x daily - 7 x weekly - 1 sets - 15 reps - Seated Long Arc Quad  - 1 x daily - 7 x weekly - 1 sets - 15 reps - Seated Knee Flexion Stretch  - 1 x daily - 7 x weekly - 1 sets - 15 reps - Seated Heel Toe Raises  - 1 x daily - 7 x weekly - 1 sets - 15 reps - Supine Heel Slide with Strap  - 1 x daily - 7 x weekly - 1 sets - 10 reps - Long Sitting Calf Stretch with Strap  - 1 x daily - 7 x weekly - 1 sets - 5 reps - 20 sec hold  10/28/23: - Standing Terminal Knee Extension at Wall with Ball  - 2 x daily - 7 x weekly - 1 sets - 10 reps - 5 hold - Heel Toe Raises with Counter Support  - 2 x daily - 7 x weekly - 3  sets - 10 reps  11/15/2023 Access Code: [email or web address] URL: [email or web address] Date: 11/15/2023 Prepared by: Omega Bottcher  Exercises - Sidelying Hip Adduction  - 1 x daily - 7 x weekly - 3 sets - 10 reps - Supine Hip Adduction Isometric with Ball  - 1 x daily - 7 x weekly - 3 sets - 10 reps - 10 hold  ASSESSMENT:  CLINICAL IMPRESSION: Today's session with focus on right knee mobility and strength.  Noted tightness continues right hamstring but can passively get it to 0.  Instructed patient in foam rolling for her hamstring.  Added hamstring curls on Cybex to work on normalizing hamstring function and promote normal resting state.   Pt will benefit from continued skilled Physical Therapy services for 2x/ 4 week  to address deficits/limitations in order to improve functional and QOL.    Evaluation: Patient is a 63 y.o. female who was seen today for physical therapy evaluation and treatment for Z96.651 (ICD-10-CM) - Status post right partial knee replacement. Patient demonstrates muscle weakness, reduced ROM, and fascial restrictions which are likely contributing to symptoms of pain and are negatively impacting patient ability to perform ADLs and functional mobility tasks. Patient will benefit from skilled physical therapy services to address these deficits to reduce pain and improve level of function with ADLs and functional mobility tasks.   OBJECTIVE IMPAIRMENTS: Abnormal gait, decreased activity tolerance, decreased endurance, decreased mobility, difficulty walking, decreased ROM, decreased strength, increased fascial restrictions, impaired perceived functional ability, and pain.   ACTIVITY LIMITATIONS: carrying, lifting, bending, sitting, standing, squatting, sleeping, stairs, transfers, bed mobility, bathing, toileting, dressing, locomotion level, and caring for others  PARTICIPATION LIMITATIONS: meal prep, cleaning, laundry, driving, shopping, community activity, and  occupation    REHAB POTENTIAL: Good  CLINICAL DECISION MAKING: Evolving/moderate complexity  EVALUATION COMPLEXITY: Moderate   GOALS: Goals reviewed with patient? Yes  SHORT TERM GOALS: Target date: 11/04/2023 patient will be independent with initial HEP  Baseline: Goal status: IN PROGRESS  2.  Patient will report 30% improvement overall  Baseline:  Goal status: MET  3.  Patient will increase right knee mobility to -5 to 90 to promote normal navigation of steps; step over step pattern  Baseline: see above Goal status: MET   LONG TERM  GOALS: Target date: 12/06/2023  Patient will be independent in self management strategies to improve quality of life and functional outcomes.  Baseline:  Goal status: IN PROGRESS  2.  Patient will report 50% improvement overall  Baseline:  Goal status: IN PROGRESS  3.  Patient will increase right knee mobility to -2 to 120 to promote normal navigation of steps; step over step pattern  Baseline:  Goal status: IN PROGRESS  4.  Patient will improve LEFS score by 27 points to demonstrate improved perceived function  Baseline: 13/80 Goal status: MET  5.  Patient will increase distance on 2 MWT by 100 ft with LRAD to demonstrate improved functional gait in community  Baseline: 75 ft with RW Goal status: MET  6.   Patient will increase right leg MMT's to 5/5 to allow navigation of steps without gait deviation or loss of balance  Baseline:  Goal status: in progress   PLAN:  PT FREQUENCY: 2x/week  PT DURATION: 6 weeks  PLANNED INTERVENTIONS: 97164- PT Re-evaluation, 97110-Therapeutic exercises, 97530- Therapeutic activity, 97112- Neuromuscular re-education, 97535- Self Care, [id number]- Manual therapy, [id number]- Gait training, [phone]- Orthotic Fit/training, [phone]- Canalith repositioning, [id number]- Aquatic Therapy, 97760- Splinting, [phone]- Wound care (first 20 sq cm), 97598- Wound care (each additional 20 sq cm)Patient/Family education,  Balance training, Stair training, Taping, Dry Needling, Joint mobilization, Joint manipulation, Spinal manipulation, Spinal mobilization, Scar mobilization, and DME instructions.   PLAN FOR NEXT SESSION: focus strengthening on RLE with emphasis on hamstring, adductors and quadriceps 2 x/ 4 weeks  8:43 AM, 11/29/23 Weldon Nouri Small Kenidi Elenbaas MPT [facility] [place] [phone]

## 2023-12-02 ENCOUNTER — Ambulatory Visit (HOSPITAL_COMMUNITY)

## 2023-12-02 ENCOUNTER — Encounter (HOSPITAL_COMMUNITY): Payer: Self-pay

## 2023-12-02 DIAGNOSIS — M25661 Stiffness of right knee, not elsewhere classified: Secondary | ICD-10-CM | POA: Diagnosis not present

## 2023-12-02 DIAGNOSIS — M1711 Unilateral primary osteoarthritis, right knee: Secondary | ICD-10-CM | POA: Diagnosis not present

## 2023-12-02 DIAGNOSIS — R262 Difficulty in walking, not elsewhere classified: Secondary | ICD-10-CM | POA: Diagnosis not present

## 2023-12-02 DIAGNOSIS — M25561 Pain in right knee: Secondary | ICD-10-CM

## 2023-12-02 NOTE — Therapy (Signed)
 OUTPATIENT PHYSICAL THERAPY LOWER EXTREMITY TREATMENT   Patient Name: Alisha Reed MRN: [id number] DOB:1961-02-17, 63 y.o., female Today's Date: 12/02/2023  END OF SESSION:  PT End of Session - 12/02/23 0810     Visit Number 12    Number of Visits 18    Date for PT Re-Evaluation 12/13/23    Authorization Type [employer]    Progress Note Due on Visit 18    PT Start Time 0810    PT Stop Time [phone]    PT Time Calculation (min) 45 min    Activity Tolerance Patient tolerated treatment well    Behavior During Therapy [facility] for tasks assessed/performed            Past Medical History:  Diagnosis Date   Arthritis    hands,    Carpal tunnel syndrome on both sides    both hands surgeries 01/2019   GERD (gastroesophageal reflux disease)    Hypercholesterolemia    Hypothyroidism    Osteopenia 11/08/2018   Thyroid  disease    Past Surgical History:  Procedure Laterality Date   BILATERAL CARPAL TUNNEL RELEASE Bilateral 01/18/2019   Procedure: BILATERAL CARPAL TUNNEL RELEASE;  Surgeon: Jerri Kay HERO, MD;  Location: [facility];  Service: Orthopedics;  Laterality: Bilateral;   BLADDER SUSPENSION N/A 08/10/2022   Procedure: TRANSVAGINAL TAPE (TVT) PROCEDURE;  Surgeon: Marilynne Rosaline SAILOR, MD;  Location: [facility];  Service: Gynecology;  Laterality: N/A;  Total time requested is 1 hour.   CHOLECYSTECTOMY     COLONOSCOPY  2011   CYSTOSCOPY N/A 08/10/2022   Procedure: CYSTOSCOPY;  Surgeon: Marilynne Rosaline SAILOR, MD;  Location: [facility];  Service: Gynecology;  Laterality: N/A;   FOOT SURGERY Bilateral    HYSTERECTOMY ABDOMINAL WITH SALPINGECTOMY     NASAL SINUS SURGERY     PARTIAL KNEE ARTHROPLASTY Right 10/11/2023   Procedure: ARTHROPLASTY, KNEE, UNICOMPARTMENTAL;  Surgeon: Jerri Kay HERO, MD;  Location: [facility];  Service: Orthopedics;  Laterality: Right;  RIGHT KNEE MEDIAL UKA vs TKA   Patient Active Problem List    Diagnosis Date Noted   Status post right partial knee replacement 10/11/2023   Primary osteoarthritis of right knee 06/29/2023   Right tennis elbow 06/29/2023   Pain in right elbow 01/26/2023   Coronary artery calcification seen on CT scan 01/09/2023   Allergic rhinitis 12/09/2022   Microhematuria 12/09/2022   Rectal bleeding 03/13/2022   Other constipation 03/13/2022   Arthritis 12/03/2021   Stress incontinence 11/19/2020   S/p bilateral carpal tunnel release 03/28/2019   Chronic pain of right knee 12/29/2018   Right carpal tunnel syndrome 12/29/2018   Left carpal tunnel syndrome 12/29/2018   Osteopenia 11/08/2018   Pre-diabetes 05/02/2018   Acute pain of right shoulder 03/18/2018   Hyperlipidemia 11/04/2017   Vitamin D  deficiency 11/04/2017   S/P cholecystectomy 08/02/2017   Skier's thumb, right, subsequent encounter 03/15/2017   Chronic left shoulder pain 02/08/2017   Encounter for well adult exam with abnormal findings 09/24/2016   Hypothyroidism 09/24/2016    PCP: Lynwood Rush, MD  REFERRING PROVIDER: Jerri Kay HERO, MD  REFERRING DIAG: [phone] (ICD-10-CM) - Status post right partial knee replacement  THERAPY DIAG:  Acute pain of right knee  Stiffness of right knee, not elsewhere classified  Primary osteoarthritis of right knee  Difficulty in walking, not elsewhere classified  Rationale for Evaluation and Treatment: Rehabilitation  ONSET DATE: 10/11/23  SUBJECTIVE:   SUBJECTIVE STATEMENT: 12/02/23:  Knee is very achy, pain scale 5/10.  Exercises going well at home.  Would like to review the hamstring rolling, bought foam that arrived yesterday.  Reports going downstairs is difficult.   Evaluation:  Goes by Alisha Reed; knee pain for a while; had injections for several years but then stopped helping her.  She saw Dr. Jerri; had surgery 10/11/23; has CPM machine 6 hours a day.    PERTINENT HISTORY: OR nurse PAIN:  Are you having pain? Yes: NPRS scale: 8/10 Pain  location: right knee; and down back of calf Pain description: sore, tight, stabbing and aching Aggravating factors: movement Relieving factors: oxycodone  with acetaminophen , ice man  PRECAUTIONS: None  RED FLAGS: None   WEIGHT BEARING RESTRICTIONS: No  FALLS:  Has patient fallen in last 6 months? No  LIVING ENVIRONMENT: Lives with: lives with their spouse Lives in: House/apartment Stairs: Yes: Internal: 14 steps; on left going up and External: 2 steps; on right going up, on left going up, and bilateral but cannot reach both Has following equipment at home: Vannie - 2 wheeled  OCCUPATION: OR nurse  PLOF: Independent  PATIENT GOALS: go back to work; going to [employer] in 5 weeks; [place]  NEXT MD VISIT: 10/26/23  OBJECTIVE:  Note: Objective measures were completed at Evaluation unless otherwise noted.  DIAGNOSTIC FINDINGS:   PATIENT SURVEYS:  LEFS 13/80 Extreme difficulty/unable (0), Quite a bit of difficulty (1), Moderate difficulty (2), Little difficulty (3), No difficulty (4) Survey date:    Any of your usual work, housework or school activities   2. Usual hobbies, recreational or sporting activities   3. Getting into/out of the bath   4. Walking between rooms   5. Putting on socks/shoes   6. Squatting    7. Lifting an object, like a bag of groceries from the floor   8. Performing light activities around your home   9. Performing heavy activities around your home   10. Getting into/out of a car   11. Walking 2 blocks   12. Walking 1 mile   13. Going up/down 10 stairs (1 flight)   14. Standing for 1 hour   15.  sitting for 1 hour   16. Running on even ground   17. Running on uneven ground   18. Making sharp turns while running fast   19. Hopping    20. Rolling over in bed   Score total:  13/80     COGNITION: Overall cognitive status: Within functional limits for tasks assessed     SENSATION: Some numbness outside of lower leg  EDEMA:  Normal for this  time s/p partial TKA; bandage in place   POSTURE: rounded shoulders and forward head  PALPATION: General soreness and redness  LOWER EXTREMITY ROM:  Active ROM Right eval Left eval Right 10/21/23 Right  10/25/23 Right 11/08/23 Right 11/15/2023  Hip flexion        Hip extension        Hip abduction        Hip adduction        Hip internal rotation        Hip external rotation        Knee flexion 76 AAROM  88 AAROM with rope 92 115 AROM 120  115  Knee extension -8  5 lacking from neutral 3 0 0  Ankle dorsiflexion        Ankle plantarflexion        Ankle inversion  Ankle eversion         (Blank rows = not tested)  LOWER EXTREMITY MMT:  MMT Right eval Left eval Right 11/15/2023 Left 11/15/2023  Hip flexion   3- 4-  Hip extension      Hip abduction      Hip adduction      Hip internal rotation      Hip external rotation      Knee flexion   3+ 4  Knee extension Poor quad set  3+ 5  Ankle dorsiflexion      Ankle plantarflexion      Ankle inversion      Ankle eversion       (Blank rows = not tested)   FUNCTIONAL TESTS:  5 times sit to stand: 41.01 2 minute walk test: 75 ft with RW  GAIT: Distance walked: 75 ft in clinic Assistive device utilized: Walker - 2 wheeled Level of assistance: SBA Comments: antalgic gait                                                                                                                                TREATMENT DATE:  12/02/23: Bike full revolution seat 9 x 5' L3 resistance Seated treadmill walk 3x 1' Gait training to improve arm swing x 239ft Cybex hamstring curls 4 plates 2 x 10 both concentric and right only eccentric 12 box hamstring stretch 3x 30 STM to right knee followed by manual ROM to the same x 15' to decrease pain and improve soft tissue extensibility Step down training 4in step 15x 1HHA    11/29/23 STM to right knee followed by manual ROM to the same x 15' to decrease pain and improve soft tissue  extensibility Instruction in foam rolling right hamstring Bike seat 9 x 5' full revolutions  12 box hamstring stretch 10 x 10' Cybex hamstring curls 2 plates 2 x 10 both concentric and right only eccentric    11/15/2023  -LEFS: 59 / 80 = 73.8 % -ROM -MMT -5x STS: 15.03 seconds - : 330ft -4in eccentric step downs with BUE support 2 x 10 -Sidelying hip adduction x 12 w/ 3'' -LAQ with 7.5 ankle weight x 10 with 3'' eccentric  11/11/2023  -Recumbent bike seat 8 x 4' -Knee drive on 2nd step with BUE support 10x5'' -Standing HS stretch 2 x 1' -7in step ups and down x 12 with no UE support -7in lateral step ups and down x 12 with no UE support -Stiff-legged deadlift x 12 w/ 10lb KB -Standing on RLE hip abduction with GTB at knees x 15 w/ 3'' -2in eccentric step down x 12   11/08/23 Standing:  heelraises on incline 20X  Knee flexion 20X knee drives 10X on 12 step  Hamstring stretch 3X30 12 step  Tandem stance  Bike seat 8, full rev 5 minutes Gait without AD 226' Supine:  manual scar release along scar line  AROM 0-115/120  PATIENT EDUCATION:  Education details: Patient educated on exam findings, POC, scope of PT, HEP, and what to expect in the next few visits. Person educated: Patient Education method: Explanation, Demonstration, and Handouts Education comprehension: verbalized understanding, returned demonstration, verbal cues required, and tactile cues required  HOME EXERCISE PROGRAM: Access Code: [email or web address] URL: [email or web address] Date: 10/14/2023 Prepared by: [facility] - Rehab  Exercises - Supine Ankle Pumps  - 1 x daily - 7 x weekly - 1 sets - 15 reps - Supine Quadricep Sets  - 1 x daily - 7 x weekly - 1 sets - 15 reps - Supine Short Arc Quad  - 1 x daily - 7 x weekly - 1 sets - 15 reps - Supine Heel Slides  - 1 x daily - 7 x weekly - 1 sets - 15 reps - Seated Long Arc Quad  - 1 x daily - 7 x weekly - 1 sets - 15 reps - Seated Knee Flexion Stretch   - 1 x daily - 7 x weekly - 1 sets - 15 reps - Seated Heel Toe Raises  - 1 x daily - 7 x weekly - 1 sets - 15 reps - Supine Heel Slide with Strap  - 1 x daily - 7 x weekly - 1 sets - 10 reps - Long Sitting Calf Stretch with Strap  - 1 x daily - 7 x weekly - 1 sets - 5 reps - 20 sec hold  10/28/23: - Standing Terminal Knee Extension at Wall with Ball  - 2 x daily - 7 x weekly - 1 sets - 10 reps - 5 hold - Heel Toe Raises with Counter Support  - 2 x daily - 7 x weekly - 3 sets - 10 reps  11/15/2023 Access Code: [email or web address] URL: [email or web address] Date: 11/15/2023 Prepared by: Omega Bottcher  Exercises - Sidelying Hip Adduction  - 1 x daily - 7 x weekly - 3 sets - 10 reps - Supine Hip Adduction Isometric with Ball  - 1 x daily - 7 x weekly - 3 sets - 10 reps - 10 hold  ASSESSMENT:  CLINICAL IMPRESSION: Progressed hamstring stretch with additional seated treadmill walk and ability to increase weight with cybex hamstring curls that was tolerated well.  Cueing to improve arm swing to normalize gait mechanics.  Reviewed mechanics with self massage with foam roll as pt has purchased foam for home use.  Added step down training for eccentric strengthening.  EOS reports of pain reduced and improve mobility.   Evaluation: Patient is a 63 y.o. female who was seen today for physical therapy evaluation and treatment for Z96.651 (ICD-10-CM) - Status post right partial knee replacement. Patient demonstrates muscle weakness, reduced ROM, and fascial restrictions which are likely contributing to symptoms of pain and are negatively impacting patient ability to perform ADLs and functional mobility tasks. Patient will benefit from skilled physical therapy services to address these deficits to reduce pain and improve level of function with ADLs and functional mobility tasks.   OBJECTIVE IMPAIRMENTS: Abnormal gait, decreased activity tolerance, decreased endurance, decreased mobility, difficulty  walking, decreased ROM, decreased strength, increased fascial restrictions, impaired perceived functional ability, and pain.   ACTIVITY LIMITATIONS: carrying, lifting, bending, sitting, standing, squatting, sleeping, stairs, transfers, bed mobility, bathing, toileting, dressing, locomotion level, and caring for others  PARTICIPATION LIMITATIONS: meal prep, cleaning, laundry, driving, shopping, community activity, and occupation    REHAB POTENTIAL: Good  CLINICAL DECISION MAKING: Evolving/moderate complexity  EVALUATION COMPLEXITY: Moderate   GOALS: Goals  reviewed with patient? Yes  SHORT TERM GOALS: Target date: 11/04/2023 patient will be independent with initial HEP  Baseline: Goal status: IN PROGRESS  2.  Patient will report 30% improvement overall  Baseline:  Goal status: MET  3.  Patient will increase right knee mobility to -5 to 90 to promote normal navigation of steps; step over step pattern  Baseline: see above Goal status: MET   LONG TERM GOALS: Target date: 12/06/2023  Patient will be independent in self management strategies to improve quality of life and functional outcomes.  Baseline:  Goal status: IN PROGRESS  2.  Patient will report 50% improvement overall  Baseline:  Goal status: IN PROGRESS  3.  Patient will increase right knee mobility to -2 to 120 to promote normal navigation of steps; step over step pattern  Baseline:  Goal status: IN PROGRESS  4.  Patient will improve LEFS score by 27 points to demonstrate improved perceived function  Baseline: 13/80 Goal status: MET  5.  Patient will increase distance on 2 MWT by 100 ft with LRAD to demonstrate improved functional gait in community  Baseline: 75 ft with RW Goal status: MET  6.   Patient will increase right leg MMT's to 5/5 to allow navigation of steps without gait deviation or loss of balance  Baseline:  Goal status: in progress   PLAN:  PT FREQUENCY: 2x/week  PT DURATION: 6  weeks  PLANNED INTERVENTIONS: 97164- PT Re-evaluation, 97110-Therapeutic exercises, 97530- Therapeutic activity, 97112- Neuromuscular re-education, 97535- Self Care, 02859- Manual therapy, [id number]- Gait training, [phone]- Orthotic Fit/training, [phone]- Canalith repositioning, [id number]- Aquatic Therapy, 97760- Splinting, [phone]- Wound care (first 20 sq cm), 97598- Wound care (each additional 20 sq cm)Patient/Family education, Balance training, Stair training, Taping, Dry Needling, Joint mobilization, Joint manipulation, Spinal manipulation, Spinal mobilization, Scar mobilization, and DME instructions.   PLAN FOR NEXT SESSION: focus strengthening on RLE with emphasis on hamstring, adductors and quadriceps 2 x/ 4 weeks  Augustin Mclean, LPTA/CLT; CBIS [phone]  9:08 AM, 12/02/23

## 2023-12-03 ENCOUNTER — Other Ambulatory Visit

## 2023-12-03 ENCOUNTER — Other Ambulatory Visit: Payer: Self-pay | Admitting: Physician Assistant

## 2023-12-03 ENCOUNTER — Ambulatory Visit: Admitting: Physician Assistant

## 2023-12-03 ENCOUNTER — Ambulatory Visit (INDEPENDENT_AMBULATORY_CARE_PROVIDER_SITE_OTHER): Payer: Self-pay

## 2023-12-03 DIAGNOSIS — E538 Deficiency of other specified B group vitamins: Secondary | ICD-10-CM | POA: Diagnosis not present

## 2023-12-03 DIAGNOSIS — E7800 Pure hypercholesterolemia, unspecified: Secondary | ICD-10-CM | POA: Diagnosis not present

## 2023-12-03 DIAGNOSIS — Z96651 Presence of right artificial knee joint: Secondary | ICD-10-CM

## 2023-12-03 DIAGNOSIS — E559 Vitamin D deficiency, unspecified: Secondary | ICD-10-CM

## 2023-12-03 DIAGNOSIS — R7303 Prediabetes: Secondary | ICD-10-CM

## 2023-12-03 LAB — BASIC METABOLIC PANEL WITH GFR
BUN: 13 mg/dL (ref 6–23)
CO2: 29 meq/L (ref 19–32)
Calcium: 10.5 mg/dL (ref 8.4–10.5)
Chloride: 100 meq/L (ref 96–112)
Creatinine, Ser: 0.8 mg/dL (ref 0.40–1.20)
GFR: 77.75 mL/min (ref >60.00)
Glucose, Bld: 96 mg/dL (ref 70–99)
Potassium: 4.4 meq/L (ref 3.5–5.1)
Sodium: 139 meq/L (ref 135–145)

## 2023-12-03 LAB — LIPID PANEL
Cholesterol: 120 mg/dL (ref 0–200)
HDL: 58.3 mg/dL (ref >39.00)
LDL Cholesterol: 44 mg/dL (ref 0–99)
NonHDL: 61.57
Total CHOL/HDL Ratio: 2
Triglycerides: 89 mg/dL (ref 0.0–149.0)
VLDL: 17.8 mg/dL (ref 0.0–40.0)

## 2023-12-03 LAB — CBC WITH DIFFERENTIAL/PLATELET
Basophils Absolute: 0.1 K/uL (ref 0.0–0.1)
Basophils Relative: 0.8 % (ref 0.0–3.0)
Eosinophils Absolute: 0.2 K/uL (ref 0.0–0.7)
Eosinophils Relative: 3 % (ref 0.0–5.0)
HCT: 40.3 % (ref 36.0–46.0)
Hemoglobin: 13.2 g/dL (ref 12.0–15.0)
Lymphocytes Relative: 32.1 % (ref 12.0–46.0)
Lymphs Abs: 2.5 K/uL (ref 0.7–4.0)
MCHC: 32.8 g/dL (ref 30.0–36.0)
MCV: 91.2 fl (ref 78.0–100.0)
Monocytes Absolute: 0.7 K/uL (ref 0.1–1.0)
Monocytes Relative: 8.7 % (ref 3.0–12.0)
Neutro Abs: 4.3 K/uL (ref 1.4–7.7)
Neutrophils Relative %: 55.4 % (ref 43.0–77.0)
Platelets: 433 K/uL — ABNORMAL HIGH (ref 150.0–400.0)
RBC: 4.42 Mil/uL (ref 3.87–5.11)
RDW: 14 % (ref 11.5–15.5)
WBC: 7.8 K/uL (ref 4.0–10.5)

## 2023-12-03 LAB — URINALYSIS, ROUTINE W REFLEX MICROSCOPIC
Bilirubin Urine: NEGATIVE
Ketones, ur: NEGATIVE
Nitrite: NEGATIVE
Specific Gravity, Urine: 1.02 (ref 1.000–1.030)
Total Protein, Urine: NEGATIVE
Urine Glucose: NEGATIVE
Urobilinogen, UA: 0.2 (ref 0.0–1.0)
pH: 6 (ref 5.0–8.0)

## 2023-12-03 LAB — HEMOGLOBIN A1C: Hgb A1c MFr Bld: 6.2 % (ref 4.6–6.5)

## 2023-12-03 LAB — VITAMIN B12: Vitamin B-12: 809 pg/mL (ref 211–911)

## 2023-12-03 LAB — HEPATIC FUNCTION PANEL
ALT: 11 U/L (ref 0–35)
AST: 14 U/L (ref 0–37)
Albumin: 4.4 g/dL (ref 3.5–5.2)
Alkaline Phosphatase: 50 U/L (ref 39–117)
Bilirubin, Direct: 0.1 mg/dL (ref 0.0–0.3)
Total Bilirubin: 0.4 mg/dL (ref 0.2–1.2)
Total Protein: 7.7 g/dL (ref 6.0–8.3)

## 2023-12-03 LAB — TSH: TSH: 0.66 u[IU]/mL (ref 0.35–5.50)

## 2023-12-03 LAB — VITAMIN D 25 HYDROXY (VIT D DEFICIENCY, FRACTURES): VITD: 55.72 ng/mL (ref 30.00–100.00)

## 2023-12-03 MED ORDER — TRAMADOL HCL 50 MG PO TABS: 50.0000 mg | ORAL_TABLET | Freq: Two times a day (BID) | ORAL | 2 refills | Status: DC | PRN

## 2023-12-03 NOTE — Progress Notes (Signed)
 Post-Op Visit Note   Patient: Alisha Reed           Date of Birth: 1960/07/22           MRN: [id number] Visit Date: 12/03/2023 PCP: Norleen Lynwood ORN, MD   Assessment & Plan:  Chief Complaint:  Chief Complaint  Patient presents with   Right Knee - Routine Post Op    10/11/23 RT KNEE UNI   Visit Diagnoses:  1. Status post right partial knee replacement     Plan: Alisha Reed is a pleasant 63 year old female who comes in today approximately 7 weeks status post medial compartment knee replacement, date of surgery 10/11/2023.  She was doing well and starting to turn a corner when she fell reaching under her bed on 11/18/2023.  At that time, she feels her right knee hyperflexed.  She had increased pain for period of time but has now returned to baseline.  She is only taking tramadol  for pain.  She is still in physical therapy making progress.  Examination of her right knee reveals range of motion from 0 to 115 degrees.  She is neurovascularly intact distally.  X-rays are unremarkable for fracture.  I feel as though she likely broke up adhesions from hyperflexing her knee.  She will continue with physical therapy.  I have refilled her tramadol .  Follow-up in 5 weeks when she is 12 weeks out from surgery.  Call with concerns or questions in the meantime.  Follow-Up Instructions: Return in about 5 weeks (around 01/07/2024).   Orders:  Orders Placed This Encounter  Procedures   XR Knee 1-2 Views Right   Meds ordered this encounter  Medications   traMADol  (ULTRAM ) 50 MG tablet    Sig: Take 1 tablet (50 mg total) by mouth 2 (two) times daily as needed.    Dispense:  30 tablet    Refill:  2    Imaging: XR Knee 1-2 Views Right Result Date: 12/03/2023 Well-seated prosthesis without complication   PMFS History: Patient Active Problem List   Diagnosis Date Noted   Status post right partial knee replacement 10/11/2023   Primary osteoarthritis of right knee 06/29/2023   Right tennis elbow  06/29/2023   Pain in right elbow 01/26/2023   Coronary artery calcification seen on CT scan 01/09/2023   Allergic rhinitis 12/09/2022   Microhematuria 12/09/2022   Rectal bleeding 03/13/2022   Other constipation 03/13/2022   Arthritis 12/03/2021   Stress incontinence 11/19/2020   S/p bilateral carpal tunnel release 03/28/2019   Chronic pain of right knee 12/29/2018   Right carpal tunnel syndrome 12/29/2018   Left carpal tunnel syndrome 12/29/2018   Osteopenia 11/08/2018   Pre-diabetes 05/02/2018   Acute pain of right shoulder 03/18/2018   Hyperlipidemia 11/04/2017   Vitamin D  deficiency 11/04/2017   S/P cholecystectomy 08/02/2017   Skier's thumb, right, subsequent encounter 03/15/2017   Chronic left shoulder pain 02/08/2017   Encounter for well adult exam with abnormal findings 09/24/2016   Hypothyroidism 09/24/2016   Past Medical History:  Diagnosis Date   Arthritis    hands,    Carpal tunnel syndrome on both sides    both hands surgeries 01/2019   GERD (gastroesophageal reflux disease)    Hypercholesterolemia    Hypothyroidism    Osteopenia 11/08/2018   Thyroid  disease     Family History  Problem Relation Age of Onset   Scleroderma Mother    Rheum arthritis Mother    Heart disease Mother    Heart  disease Father    Glaucoma Father    Stomach cancer Maternal Grandmother    Heart attack Maternal Grandfather    Alzheimer's disease Paternal Grandmother    Throat cancer Paternal Grandfather    Colon cancer Neg Hx    Liver cancer Neg Hx    Colon polyps Neg Hx    Esophageal cancer Neg Hx    Rectal cancer Neg Hx     Past Surgical History:  Procedure Laterality Date   BILATERAL CARPAL TUNNEL RELEASE Bilateral 01/18/2019   Procedure: BILATERAL CARPAL TUNNEL RELEASE;  Surgeon: Jerri Kay HERO, MD;  Location: [facility];  Service: Orthopedics;  Laterality: Bilateral;   BLADDER SUSPENSION N/A 08/10/2022   Procedure: TRANSVAGINAL TAPE (TVT) PROCEDURE;   Surgeon: Marilynne Rosaline SAILOR, MD;  Location: [facility];  Service: Gynecology;  Laterality: N/A;  Total time requested is 1 hour.   CHOLECYSTECTOMY     COLONOSCOPY  2011   CYSTOSCOPY N/A 08/10/2022   Procedure: CYSTOSCOPY;  Surgeon: Marilynne Rosaline SAILOR, MD;  Location: [facility];  Service: Gynecology;  Laterality: N/A;   FOOT SURGERY Bilateral    HYSTERECTOMY ABDOMINAL WITH SALPINGECTOMY     NASAL SINUS SURGERY     PARTIAL KNEE ARTHROPLASTY Right 10/11/2023   Procedure: ARTHROPLASTY, KNEE, UNICOMPARTMENTAL;  Surgeon: Jerri Kay HERO, MD;  Location: [facility];  Service: Orthopedics;  Laterality: Right;  RIGHT KNEE MEDIAL UKA vs TKA   Social History   Occupational History   Occupation: Char[employer] fundraiser  Tobacco Use   Smoking status: Never   Smokeless tobacco: Never  Vaping Use   Vaping status: Never Used  Substance and Sexual Activity   Alcohol use: Yes    Comment: social- maybe once a month   Drug use: No   Sexual activity: Yes    Birth control/protection: Surgical

## 2023-12-03 NOTE — Telephone Encounter (Signed)
 Can you add s/p right partial knee replacement for diagnosis and Katrine Radich lindsey Cristopher Ciccarelli

## 2023-12-04 ENCOUNTER — Encounter: Payer: Self-pay | Admitting: Internal Medicine

## 2023-12-05 NOTE — Discharge Summary (Signed)
 Patient ID: Alisha Reed MRN: [id number] DOB/AGE: [birth year] 63 y.o.  Admit date: 10/11/2023 Discharge date: 10/11/2023  Admission Diagnoses:  Primary osteoarthritis of right knee  Discharge Diagnoses:  Principal Problem:   Primary osteoarthritis of right knee Active Problems:   Status post right partial knee replacement   Past Medical History:  Diagnosis Date   Arthritis    hands,    Carpal tunnel syndrome on both sides    both hands surgeries 01/2019   GERD (gastroesophageal reflux disease)    Hypercholesterolemia    Hypothyroidism    Osteopenia 11/08/2018   Thyroid  disease     Surgeries: Procedure(s): ARTHROPLASTY, KNEE, UNICOMPARTMENTAL on 10/11/2023   Consultants (if any):   Discharged Condition: Improved  Hospital Course: Alisha Reed is an 63 y.o. female who was admitted 10/11/2023 with a diagnosis of Primary osteoarthritis of right knee and went to the operating room on 10/11/2023 and underwent the above named procedures.    She was given perioperative antibiotics:  Anti-infectives (From admission, onward)    Start     Dose/Rate Route Frequency Ordered Stop   10/11/23 1115  ceFAZolin  (ANCEF ) IVPB 2g/100 mL premix  Status:  Discontinued        2 g 200 mL/hr over 30 Minutes Intravenous Every 6 hours 10/11/23 1108 10/11/23 2314   10/11/23 0816  vancomycin  (VANCOCIN ) powder  Status:  Discontinued          As needed 10/11/23 0816 10/11/23 0920   10/11/23 0600  ceFAZolin  (ANCEF ) IVPB 2g/100 mL premix        2 g 200 mL/hr over 30 Minutes Intravenous On call to O.R. 10/11/23 9450 10/11/23 0753     .  She was given sequential compression devices, early ambulation, and appropriate chemoprophylaxis for DVT prophylaxis.  She benefited maximally from the hospital stay and there were no complications.    Recent vital signs:  Vitals:   10/11/23 1113 10/11/23 1539  BP: 133/63 124/63  Pulse: 70 66  Resp: 18 20  Temp:  98 F (36.7 C)  SpO2: 100%  100%    Recent laboratory studies:  Lab Results  Component Value Date   HGB 13.2 12/03/2023   HGB 11.8 (L) 09/27/2023   HGB 12.9 12/02/2022   Lab Results  Component Value Date   WBC 7.8 12/03/2023   PLT 433.0 (H) 12/03/2023   No results found for: INR Lab Results  Component Value Date   NA 139 12/03/2023   K 4.4 12/03/2023   CL 100 12/03/2023   CO2 29 12/03/2023   BUN 13 12/03/2023   CREATININE 0.80 12/03/2023   GLUCOSE 96 12/03/2023    Discharge Medications:   Allergies as of 10/11/2023   No Known Allergies      Medication List     STOP taking these medications    aspirin  EC 81 MG tablet   diclofenac  75 MG EC tablet Commonly known as: VOLTAREN    traMADol  50 MG tablet Commonly known as: ULTRAM        TAKE these medications    alendronate  70 MG tablet Commonly known as: FOSAMAX  Take 1 tablet (70 mg total) by mouth every 7 (seven) days. Take with a full glass of water  on an empty stomach.   calcium  carbonate 1250 (500 Ca) MG tablet Commonly known as: OS-CAL - dosed in mg of elemental calcium  Take 1 tablet by mouth 2 (two) times daily with a meal.   cetirizine 10 MG tablet Commonly known as:  ZYRTEC Take 10 mg by mouth daily.   docusate sodium  100 MG capsule Commonly known as: Colace Take 1 capsule (100 mg total) by mouth daily as needed.   Eliquis  2.5 MG Tabs tablet Generic drug: apixaban  Take 1 tablet (2.5 mg total) by mouth 2 (two) times daily for 30 days after surgery to prevent blood clots.   estradiol  1 MG tablet Commonly known as: ESTRACE  Take 1.5 tablets (1.5 mg total) by mouth daily.   levothyroxine  75 MCG tablet Commonly known as: SYNTHROID  Take 1 tablet (75 mcg total) by mouth daily before breakfast.   ondansetron  4 MG tablet Commonly known as: Zofran  Take 1 tablet (4 mg total) by mouth every 8 (eight) hours as needed.   Repatha  SureClick 140 MG/ML Soaj Generic drug: Evolocumab  Inject 140 mg into the skin every 14 (fourteen)  days.   rosuvastatin  40 MG tablet Commonly known as: Crestor  Take 1 tablet (40 mg total) by mouth daily.   vitamin B-12 100 MCG tablet Commonly known as: CYANOCOBALAMIN  Take 100 mcg by mouth daily.   Vitamin D  50 MCG (2000 UT) Caps Take 1 capsule by mouth daily.        Diagnostic Studies: XR Knee 1-2 Views Right Result Date: 12/03/2023 Well-seated prosthesis without complication   Disposition: Discharge disposition: 01-Home or Self Care       Discharge Instructions     Ambulatory referral to Physical Therapy   Complete by: As directed         Follow-up Information     Jule Ronal CROME, PA-C. Schedule an appointment as soon as possible for a visit in 2 week(s).   Specialty: Orthopedic Surgery Contact information: [address] [place]  [place] [address] [phone]         [facility]. Schedule an appointment as soon as possible for a visit.   Contact information: [facility] [address] [place], [place] [address] Ph: [phone]                 Signed: Ozell Cummins 12/05/2023, 7:47 AM

## 2023-12-06 NOTE — Telephone Encounter (Signed)
 Treatment would be needed only if there are urinary symptoms, such as pain, urgency, dysuria, urgency, flank pain, hematuria or n/v, fever, chills. So let me know, o/w we can see pt at her visit

## 2023-12-08 ENCOUNTER — Ambulatory Visit (HOSPITAL_COMMUNITY)

## 2023-12-08 ENCOUNTER — Encounter (HOSPITAL_COMMUNITY): Payer: Self-pay

## 2023-12-08 DIAGNOSIS — M25561 Pain in right knee: Secondary | ICD-10-CM | POA: Diagnosis not present

## 2023-12-08 DIAGNOSIS — M1711 Unilateral primary osteoarthritis, right knee: Secondary | ICD-10-CM | POA: Diagnosis not present

## 2023-12-08 DIAGNOSIS — R262 Difficulty in walking, not elsewhere classified: Secondary | ICD-10-CM | POA: Diagnosis not present

## 2023-12-08 DIAGNOSIS — M25661 Stiffness of right knee, not elsewhere classified: Secondary | ICD-10-CM | POA: Diagnosis not present

## 2023-12-08 NOTE — Therapy (Signed)
 OUTPATIENT PHYSICAL THERAPY LOWER EXTREMITY TREATMENT   Patient Name: Alisha Reed MRN: [id number] DOB:1960-06-07, 63 y.o., female Today's Date: 12/08/2023  END OF SESSION:  PT End of Session - 12/08/23 0847     Visit Number 13    Number of Visits 18    Date for PT Re-Evaluation 12/13/23    Authorization Type [employer]    Progress Note Due on Visit 18    PT Start Time 0848    PT Stop Time [phone]    PT Time Calculation (min) 43 min    Activity Tolerance Patient tolerated treatment well    Behavior During Therapy [facility] for tasks assessed/performed            Past Medical History:  Diagnosis Date   Arthritis    hands,    Carpal tunnel syndrome on both sides    both hands surgeries 01/2019   GERD (gastroesophageal reflux disease)    Hypercholesterolemia    Hypothyroidism    Osteopenia 11/08/2018   Thyroid  disease    Past Surgical History:  Procedure Laterality Date   BILATERAL CARPAL TUNNEL RELEASE Bilateral 01/18/2019   Procedure: BILATERAL CARPAL TUNNEL RELEASE;  Surgeon: Jerri Kay HERO, MD;  Location: [facility];  Service: Orthopedics;  Laterality: Bilateral;   BLADDER SUSPENSION N/A 08/10/2022   Procedure: TRANSVAGINAL TAPE (TVT) PROCEDURE;  Surgeon: Marilynne Rosaline SAILOR, MD;  Location[facility];  Service: Gynecology;  Laterality: N/A;  Total time requested is 1 hour.   CHOLECYSTECTOMY     COLONOSCOPY  2011   CYSTOSCOPY N/A 08/10/2022   Procedure: CYSTOSCOPY;  Surgeon: Marilynne Rosaline SAILOR, MD;  Location: [facility];  Service: Gynecology;  Laterality: N/A;   FOOT SURGERY Bilateral    HYSTERECTOMY ABDOMINAL WITH SALPINGECTOMY     NASAL SINUS SURGERY     PARTIAL KNEE ARTHROPLASTY Right 10/11/2023   Procedure: ARTHROPLASTY, KNEE, UNICOMPARTMENTAL;  Surgeon: Jerri Kay HERO, MD;  Location: [facility];  Service: Orthopedics;  Laterality: Right;  RIGHT KNEE MEDIAL UKA vs TKA   Patient Active Problem List    Diagnosis Date Noted   Status post right partial knee replacement 10/11/2023   Primary osteoarthritis of right knee 06/29/2023   Right tennis elbow 06/29/2023   Pain in right elbow 01/26/2023   Coronary artery calcification seen on CT scan 01/09/2023   Allergic rhinitis 12/09/2022   Microhematuria 12/09/2022   Rectal bleeding 03/13/2022   Other constipation 03/13/2022   Arthritis 12/03/2021   Stress incontinence 11/19/2020   S/p bilateral carpal tunnel release 03/28/2019   Chronic pain of right knee 12/29/2018   Right carpal tunnel syndrome 12/29/2018   Left carpal tunnel syndrome 12/29/2018   Osteopenia 11/08/2018   Pre-diabetes 05/02/2018   Acute pain of right shoulder 03/18/2018   Hyperlipidemia 11/04/2017   Vitamin D  deficiency 11/04/2017   S/P cholecystectomy 08/02/2017   Skier's thumb, right, subsequent encounter 03/15/2017   Chronic left shoulder pain 02/08/2017   Encounter for well adult exam with abnormal findings 09/24/2016   Hypothyroidism 09/24/2016    PCP: Lynwood Rush, MD  REFERRING PROVIDER: Jerri Kay HERO, MD  REFERRING DIAG: [phone] (ICD-10-CM) - Status post right partial knee replacement  THERAPY DIAG:  Acute pain of right knee  Stiffness of right knee, not elsewhere classified  Primary osteoarthritis of right knee  Difficulty in walking, not elsewhere classified  Rationale for Evaluation and Treatment: Rehabilitation  ONSET DATE: 10/11/23  SUBJECTIVE:   SUBJECTIVE STATEMENT: 12/08/23:  reports she is frustrated, knee keeps her up night.  Can't tolerate wearing pants or sheet on knee.  Has nerve pain today and some pain posterior knee.  Evaluation:  Goes by [name]; knee pain for a while; had injections for several years but then stopped helping her.  She saw Dr. Jerri; had surgery 10/11/23; has CPM machine 6 hours a day.    PERTINENT HISTORY: OR nurse PAIN:  Are you having pain? Yes: NPRS scale: 3/10 Pain location: right knee; and down back of  calf Pain description: sore, tight, stabbing and aching Aggravating factors: movement Relieving factors: oxycodone  with acetaminophen , ice man  PRECAUTIONS: None  RED FLAGS: None   WEIGHT BEARING RESTRICTIONS: No  FALLS:  Has patient fallen in last 6 months? No  LIVING ENVIRONMENT: Lives with: lives with their spouse Lives in: House/apartment Stairs: Yes: Internal: 14 steps; on left going up and External: 2 steps; on right going up, on left going up, and bilateral but cannot reach both Has following equipment at home: Vannie - 2 wheeled  OCCUPATION: OR nurse  PLOF: Independent  PATIENT GOALS: go back to work; going to [employer] in 5 weeks[place]  NEXT MD VISIT: 10/26/23  OBJECTIVE:  Note: Objective measures were completed at Evaluation unless otherwise noted.  DIAGNOSTIC FINDINGS:   PATIENT SURVEYS:  LEFS 13/80 Extreme difficulty/unable (0), Quite a bit of difficulty (1), Moderate difficulty (2), Little difficulty (3), No difficulty (4) Survey date:    Any of your usual work, housework or school activities   2. Usual hobbies, recreational or sporting activities   3. Getting into/out of the bath   4. Walking between rooms   5. Putting on socks/shoes   6. Squatting    7. Lifting an object, like a bag of groceries from the floor   8. Performing light activities around your home   9. Performing heavy activities around your home   10. Getting into/out of a car   11. Walking 2 blocks   12. Walking 1 mile   13. Going up/down 10 stairs (1 flight)   14. Standing for 1 hour   15.  sitting for 1 hour   16. Running on even ground   17. Running on uneven ground   18. Making sharp turns while running fast   19. Hopping    20. Rolling over in bed   Score total:  13/80     COGNITION: Overall cognitive status: Within functional limits for tasks assessed     SENSATION: Some numbness outside of lower leg  EDEMA:  Normal for this time s/p partial TKA; bandage in  place   POSTURE: rounded shoulders and forward head  PALPATION: General soreness and redness  LOWER EXTREMITY ROM:  Active ROM Right eval Left eval Right 10/21/23 Right  10/25/23 Right 11/08/23 Right 11/15/2023  Hip flexion        Hip extension        Hip abduction        Hip adduction        Hip internal rotation        Hip external rotation        Knee flexion 76 AAROM  88 AAROM with rope 92 115 AROM 120  115  Knee extension -8  5 lacking from neutral 3 0 0  Ankle dorsiflexion        Ankle plantarflexion        Ankle inversion        Ankle eversion         (  Blank rows = not tested)  LOWER EXTREMITY MMT:  MMT Right eval Left eval Right 11/15/2023 Left 11/15/2023  Hip flexion   3- 4-  Hip extension      Hip abduction      Hip adduction      Hip internal rotation      Hip external rotation      Knee flexion   3+ 4  Knee extension Poor quad set  3+ 5  Ankle dorsiflexion      Ankle plantarflexion      Ankle inversion      Ankle eversion       (Blank rows = not tested)   FUNCTIONAL TESTS:  5 times sit to stand: 41.01 2 minute walk test: 75 ft with RW  GAIT: Distance walked: 75 ft in clinic Assistive device utilized: Walker - 2 wheeled Level of assistance: SBA Comments: antalgic gait                                                                                                                                TREATMENT DATE:  12/08/23: Bike full revolution seat 9 x 5' L3 resistance Educated desensitization techniques with different fabrics Seated treadmill walk 3x 1' Rockerboard Rt/Lt then Pf/Df x2 min Step down training 4in 15x 1HHA Step down 6in 10x 1 HHA STM to right knee followed by manual ROM to the same x 12' to decrease pain and improve soft tissue extensibility AROM 3-115 degrees Reviewed HEP to complete at home  12/02/23: Bike full revolution seat 9 x 5' L3 resistance Seated treadmill walk 3x 1' Gait training to improve arm swing x 228ft Cybex  hamstring curls 4 plates 2 x 10 both concentric and right only eccentric 12 box hamstring stretch 3x 30 STM to right knee followed by manual ROM to the same x 15' to decrease pain and improve soft tissue extensibility Step down training 4in step 15x 1HHA    11/29/23 STM to right knee followed by manual ROM to the same x 15' to decrease pain and improve soft tissue extensibility Instruction in foam rolling right hamstring Bike seat 9 x 5' full revolutions  12 box hamstring stretch 10 x 10' Cybex hamstring curls 2 plates 2 x 10 both concentric and right only eccentric    11/15/2023  -LEFS: 59 / 80 = 73.8 % -ROM -MMT -5x STS: 15.03 seconds - : 360ft -4in eccentric step downs with BUE support 2 x 10 -Sidelying hip adduction x 12 w/ 3'' -LAQ with 7.5 ankle weight x 10 with 3'' eccentric  11/11/2023  -Recumbent bike seat 8 x 4' -Knee drive on 2nd step with BUE support 10x5'' -Standing HS stretch 2 x 1' -7in step ups and down x 12 with no UE support -7in lateral step ups and down x 12 with no UE support -Stiff-legged deadlift x 12 w/ 10lb KB -Standing on RLE hip abduction with GTB at knees x 15 w/  3'' -2in eccentric step down x 12   11/08/23 Standing:  heelraises on incline 20X  Knee flexion 20X knee drives 10X on 12 step  Hamstring stretch 3X30 12 step  Tandem stance  Bike seat 8, full rev 5 minutes Gait without AD 226' Supine:  manual scar release along scar line  AROM 0-115/120   PATIENT EDUCATION:  Education details: Patient educated on exam findings, POC, scope of PT, HEP, and what to expect in the next few visits. Person educated: Patient Education method: Explanation, Demonstration, and Handouts Education comprehension: verbalized understanding, returned demonstration, verbal cues required, and tactile cues required  HOME EXERCISE PROGRAM: Access Code: [email or web address] URL: [email or web address] Date: 10/14/2023 Prepared by: AP -  Rehab  Exercises - Supine Ankle Pumps  - 1 x daily - 7 x weekly - 1 sets - 15 reps - Supine Quadricep Sets  - 1 x daily - 7 x weekly - 1 sets - 15 reps - Supine Short Arc Quad  - 1 x daily - 7 x weekly - 1 sets - 15 reps - Supine Heel Slides  - 1 x daily - 7 x weekly - 1 sets - 15 reps - Seated Long Arc Quad  - 1 x daily - 7 x weekly - 1 sets - 15 reps - Seated Knee Flexion Stretch  - 1 x daily - 7 x weekly - 1 sets - 15 reps - Seated Heel Toe Raises  - 1 x daily - 7 x weekly - 1 sets - 15 reps - Supine Heel Slide with Strap  - 1 x daily - 7 x weekly - 1 sets - 10 reps - Long Sitting Calf Stretch with Strap  - 1 x daily - 7 x weekly - 1 sets - 5 reps - 20 sec hold  10/28/23: - Standing Terminal Knee Extension at Wall with Ball  - 2 x daily - 7 x weekly - 1 sets - 10 reps - 5 hold - Heel Toe Raises with Counter Support  - 2 x daily - 7 x weekly - 3 sets - 10 reps  11/15/2023 Access Code: [email or web address] URL: [email or web address] Date: 11/15/2023 Prepared by: Omega Bottcher  Exercises - Sidelying Hip Adduction  - 1 x daily - 7 x weekly - 3 sets - 10 reps - Supine Hip Adduction Isometric with Ball  - 1 x daily - 7 x weekly - 3 sets - 10 reps - 10 hold  ASSESSMENT:  CLINICAL IMPRESSION: Session focus on knee mobility, strengthening and pain control.  Pt educated on desensitization techniques to improve tolerance wearing pants and sheets at night.  Pt with tendency to stand with Lt LE weight bearing, added rockerboard to improve weight distribution and discussed to improve awareness.  Continued eccentric quad strengthening, able to increase height with some cueing for control.  EOS with manual STM to quad, decongestive techniques for edema control and patella mobility.  Noted lacking some extension with AROM 3-115 today.  Encouraged to increase quad sets at home.   Evaluation: Patient is a 63 y.o. female who was seen today for physical therapy evaluation and treatment for Z96.651  (ICD-10-CM) - Status post right partial knee replacement. Patient demonstrates muscle weakness, reduced ROM, and fascial restrictions which are likely contributing to symptoms of pain and are negatively impacting patient ability to perform ADLs and functional mobility tasks. Patient will benefit from skilled physical therapy services to address these deficits to reduce pain and improve level of function with ADLs and functional  mobility tasks.   OBJECTIVE IMPAIRMENTS: Abnormal gait, decreased activity tolerance, decreased endurance, decreased mobility, difficulty walking, decreased ROM, decreased strength, increased fascial restrictions, impaired perceived functional ability, and pain.   ACTIVITY LIMITATIONS: carrying, lifting, bending, sitting, standing, squatting, sleeping, stairs, transfers, bed mobility, bathing, toileting, dressing, locomotion level, and caring for others  PARTICIPATION LIMITATIONS: meal prep, cleaning, laundry, driving, shopping, community activity, and occupation    REHAB POTENTIAL: Good  CLINICAL DECISION MAKING: Evolving/moderate complexity  EVALUATION COMPLEXITY: Moderate   GOALS: Goals reviewed with patient? Yes  SHORT TERM GOALS: Target date: 11/04/2023 patient will be independent with initial HEP  Baseline: Goal status: IN PROGRESS  2.  Patient will report 30% improvement overall  Baseline:  Goal status: MET  3.  Patient will increase right knee mobility to -5 to 90 to promote normal navigation of steps; step over step pattern  Baseline: see above Goal status: MET   LONG TERM GOALS: Target date: 12/06/2023  Patient will be independent in self management strategies to improve quality of life and functional outcomes.  Baseline:  Goal status: IN PROGRESS  2.  Patient will report 50% improvement overall  Baseline:  Goal status: IN PROGRESS  3.  Patient will increase right knee mobility to -2 to 120 to promote normal navigation of steps; step  over step pattern  Baseline:  Goal status: IN PROGRESS  4.  Patient will improve LEFS score by 27 points to demonstrate improved perceived function  Baseline: 13/80 Goal status: MET  5.  Patient will increase distance on 2 MWT by 100 ft with LRAD to demonstrate improved functional gait in community  Baseline: 75 ft with RW Goal status: MET  6.   Patient will increase right leg MMT's to 5/5 to allow navigation of steps without gait deviation or loss of balance  Baseline:  Goal status: in progress   PLAN:  PT FREQUENCY: 2x/week  PT DURATION: 6 weeks  PLANNED INTERVENTIONS: 97164- PT Re-evaluation, 97110-Therapeutic exercises, 97530- Therapeutic activity, 97112- Neuromuscular re-education, 97535- Self Care, [id number]- Manual therapy, [id number]- Gait training, [phone]- Orthotic Fit/training, [phone]- Canalith repositioning, [id number]- Aquatic Therapy, 97760- Splinting, [phone]- Wound care (first 20 sq cm), 97598- Wound care (each additional 20 sq cm)Patient/Family education, Balance training, Stair training, Taping, Dry Needling, Joint mobilization, Joint manipulation, Spinal manipulation, Spinal mobilization, Scar mobilization, and DME instructions.   PLAN FOR NEXT SESSION: focus strengthening on RLE with emphasis on hamstring, adductors and quadriceps 2 x/ 4 weeks  Augustin Mclean, LPTA/CLT; CBIS [phone]  10:32 AM, 12/08/23

## 2023-12-09 ENCOUNTER — Encounter: Payer: Self-pay | Admitting: Internal Medicine

## 2023-12-09 ENCOUNTER — Other Ambulatory Visit (HOSPITAL_COMMUNITY): Payer: Self-pay

## 2023-12-09 ENCOUNTER — Ambulatory Visit (INDEPENDENT_AMBULATORY_CARE_PROVIDER_SITE_OTHER): Payer: Commercial Managed Care - PPO | Admitting: Internal Medicine

## 2023-12-09 VITALS — BP 136/82 | HR 74 | Temp 98.1°F | Ht 62.0 in | Wt 154.0 lb

## 2023-12-09 DIAGNOSIS — M8580 Other specified disorders of bone density and structure, unspecified site: Secondary | ICD-10-CM

## 2023-12-09 DIAGNOSIS — E039 Hypothyroidism, unspecified: Secondary | ICD-10-CM | POA: Diagnosis not present

## 2023-12-09 DIAGNOSIS — Z0000 Encounter for general adult medical examination without abnormal findings: Secondary | ICD-10-CM

## 2023-12-09 DIAGNOSIS — E7800 Pure hypercholesterolemia, unspecified: Secondary | ICD-10-CM | POA: Diagnosis not present

## 2023-12-09 DIAGNOSIS — R7303 Prediabetes: Secondary | ICD-10-CM | POA: Diagnosis not present

## 2023-12-09 DIAGNOSIS — E559 Vitamin D deficiency, unspecified: Secondary | ICD-10-CM | POA: Diagnosis not present

## 2023-12-09 DIAGNOSIS — Z0001 Encounter for general adult medical examination with abnormal findings: Secondary | ICD-10-CM

## 2023-12-09 MED ORDER — LEVOTHYROXINE SODIUM 75 MCG PO TABS
75.0000 ug | ORAL_TABLET | Freq: Every day | ORAL | 3 refills | Status: AC
  Filled 2023-12-09 – 2024-03-07 (×2): qty 90, 90d supply, fill #0

## 2023-12-09 MED ORDER — PANTOPRAZOLE SODIUM 40 MG PO TBEC
40.0000 mg | DELAYED_RELEASE_TABLET | Freq: Every day | ORAL | 3 refills | Status: AC
  Filled 2023-12-09 – 2024-01-07 (×2): qty 90, 90d supply, fill #0
  Filled 2024-04-20: qty 90, 90d supply, fill #1

## 2023-12-09 NOTE — Patient Instructions (Signed)
Please continue all other medications as before, and refills have been done if requested.  Please have the pharmacy call with any other refills you may need.  Please continue your efforts at being more active, low cholesterol diet, and weight control.  You are otherwise up to date with prevention measures today.  Please keep your appointments with your specialists as you may have planned  Please make an Appointment to return for your 1 year visit, or sooner if needed, with Lab testing by Appointment as well, to be done about 3-5 days before at the FIRST FLOOR Lab (so this is for TWO appointments - please see the scheduling desk as you leave)    

## 2023-12-09 NOTE — Progress Notes (Signed)
 Patient ID: Alisha Reed, female   DOB: [birth year], 63 y.o.   MRN: [id number]         Chief Complaint:: wellness exam and Annual Exam (Annual Exam. Labs 12/03/23. FYI of partial knee replacement (right) in June. Review continuation of repatha )  , hld, low vit d, preDM, low thyroid        HPI:  Alisha Reed is a 63 y.o. female here for wellness exam; declines flu shot as not stocked today, o/w up to date, now retired since dec 2024               Also still on Hrt s/p TAH BSO at 63yo., last Dxa mar 2024 - mild osteopenia only.   Pt denies chest pain, increased sob or doe, wheezing, orthopnea, PND, increased LE swelling, palpitations, dizziness or syncope.   Pt denies polydipsia, polyuria, or new focal neuro s/s.    Pt denies fever, wt loss, night sweats, loss of appetite, or other constitutional symptoms  Did have right medial knee partial replacement very recently, but walking without cane today, no recent falls.     Wt Readings from Last 3 Encounters:  12/09/23 154 lb (69.9 kg)  10/11/23 (P) 169 lb 11.2 oz (77 kg)  09/27/23 169 lb 11.2 oz (77 kg)   BP Readings from Last 3 Encounters:  12/09/23 136/82  10/11/23 124/63  09/27/23 127/85   Immunization History  Administered Date(s) Administered   Influenza,inj,Quad PF,6+ Mos 01/14/2018   Influenza-Unspecified 02/13/2019   PFIZER(Purple Top)SARS-COV-2 Vaccination 05/03/2019, 05/24/2019, 02/28/2020, 08/28/2020, 02/24/2021   PNEUMOCOCCAL CONJUGATE-20 11/15/2020   Pfizer(Comirnaty)Fall Seasonal Vaccine 12 years and older 03/11/2022   Tdap 11/04/2017   Zoster Recombinant(Shingrix ) 09/25/2021, 12/03/2021   Health Maintenance Due  Topic Date Due   Cervical Cancer Screening (HPV/Pap Cotest)  Never done   INFLUENZA VACCINE  12/10/2023      Past Medical History:  Diagnosis Date   Allergy seasonal   Arthritis    hands,    Carpal tunnel syndrome on both sides    both hands surgeries 01/2019   GERD (gastroesophageal reflux  disease)    Hypercholesterolemia    Hypothyroidism    Osteopenia 11/08/2018   Thyroid  disease    Past Surgical History:  Procedure Laterality Date   ABDOMINAL HYSTERECTOMY     BILATERAL CARPAL TUNNEL RELEASE Bilateral 01/18/2019   Procedure: BILATERAL CARPAL TUNNEL RELEASE;  Surgeon: Jerri Kay HERO, MD;  Location: [facility];  Service: Orthopedics;  Laterality: Bilateral;   BLADDER SUSPENSION N/A 08/10/2022   Procedure: TRANSVAGINAL TAPE (TVT) PROCEDURE;  Surgeon: Marilynne Rosaline SAILOR, MD;  Location: [facility];  Service: Gynecology;  Laterality: N/A;  Total time requested is 1 hour.   CHOLECYSTECTOMY     COLONOSCOPY  2011   CYSTOSCOPY N/A 08/10/2022   Procedure: CYSTOSCOPY;  Surgeon: Marilynne Rosaline SAILOR, MD;  Location: [facility];  Service: Gynecology;  Laterality: N/A;   FOOT SURGERY Bilateral    HYSTERECTOMY ABDOMINAL WITH SALPINGECTOMY     JOINT REPLACEMENT  10/11/2023   NASAL SINUS SURGERY     PARTIAL KNEE ARTHROPLASTY Right 10/11/2023   Procedure: ARTHROPLASTY, KNEE, UNICOMPARTMENTAL;  Surgeon: Jerri Kay HERO, MD;  Location: [facility];  Service: Orthopedics;  Laterality: Right;  RIGHT KNEE MEDIAL UKA vs TKA   TUBAL LIGATION      reports that she has never smoked. She has never used smokeless tobacco. She reports current alcohol use of about 1.0 standard drink of alcohol per  week. She reports that she does not use drugs. family history includes Alzheimer's disease in her paternal grandmother; COPD in her father; Glaucoma in her father; Heart attack in her maternal grandfather; Heart disease in her father and mother; Hyperlipidemia in her father and mother; Hypertension in her father and mother; Rheum arthritis in her mother; Scleroderma in her mother; Stomach cancer in her maternal grandmother; Throat cancer in her paternal grandfather; Varicose Veins in her mother. No Known Allergies Current Outpatient Medications on File Prior to Visit   Medication Sig Dispense Refill   calcium  carbonate (OS-CAL - DOSED IN MG OF ELEMENTAL CALCIUM ) 1250 (500 Ca) MG tablet Take 1 tablet by mouth 2 (two) times daily with a meal.     cetirizine (ZYRTEC) 10 MG tablet Take 10 mg by mouth daily.     Cholecalciferol (VITAMIN D ) 50 MCG (2000 UT) CAPS Take 1 capsule by mouth daily.     estradiol  (ESTRACE ) 1 MG tablet Take 1.5 tablets (1.5 mg total) by mouth daily. 135 tablet 4   Evolocumab  (REPATHA  SURECLICK) 140 MG/ML SOAJ Inject 140 mg into the skin every 14 (fourteen) days. 6 mL 3   ondansetron  (ZOFRAN ) 4 MG tablet Take 1 tablet (4 mg total) by mouth every 8 (eight) hours as needed. 40 tablet 0   rosuvastatin  (CRESTOR ) 40 MG tablet Take 1 tablet (40 mg total) by mouth daily. 90 tablet 3   traMADol  (ULTRAM ) 50 MG tablet TAKE 1 TABLET BY MOUTH TWICE DAILY AS NEEDED 30 tablet 2   vitamin B-12 (CYANOCOBALAMIN ) 100 MCG tablet Take 100 mcg by mouth daily.     No current facility-administered medications on file prior to visit.        ROS:  All others reviewed and negative.  Objective        PE:  BP 136/82   Pulse 74   Temp 98.1 F (36.7 C)   Ht 5' 2 (1.575 m)   Wt 154 lb (69.9 kg)   SpO2 98%   BMI 28.17 kg/m                 Constitutional: Pt appears in NAD               HENT: Head: NCAT.                Right Ear: External ear normal.                 Left Ear: External ear normal.                Eyes: . Pupils are equal, round, and reactive to light. Conjunctivae and EOM are normal               Nose: without d/c or deformity               Neck: Neck supple. Gross normal ROM               Cardiovascular: Normal rate and regular rhythm.                 Pulmonary/Chest: Effort normal and breath sounds without rales or wheezing.                Abd:  Soft, NT, ND, + BS, no organomegaly               Neurological: Pt is alert. At baseline orientation, motor grossly intact  Skin: Skin is warm. No rashes, no other new lesions, LE  edema - none               Psychiatric: Pt behavior is normal without agitation   Micro: none  Cardiac tracings I have personally interpreted today:  none  Pertinent Radiological findings (summarize): none   Lab Results  Component Value Date   WBC 7.8 12/03/2023   HGB 13.2 12/03/2023   HCT 40.3 12/03/2023   PLT 433.0 (H) 12/03/2023   GLUCOSE 96 12/03/2023   CHOL 120 12/03/2023   TRIG 89.0 12/03/2023   HDL 58.30 12/03/2023   LDLCALC 44 12/03/2023   ALT 11 12/03/2023   AST 14 12/03/2023   NA 139 12/03/2023   K 4.4 12/03/2023   CL 100 12/03/2023   CREATININE 0.80 12/03/2023   BUN 13 12/03/2023   CO2 29 12/03/2023   TSH 0.66 12/03/2023   HGBA1C 6.2 12/03/2023   Assessment/Plan:  Alisha Reed is a 63 y.o. White or Caucasian [1] female with  has a past medical history of Allergy (seasonal), Arthritis, Carpal tunnel syndrome on both sides, GERD (gastroesophageal reflux disease), Hypercholesterolemia, Hypothyroidism, Osteopenia (11/08/2018), and Thyroid  disease.  Encounter for well adult exam with abnormal findings Age and sex appropriate education and counseling updated with regular exercise and diet Referrals for preventative services - none needed Immunizations addressed - none needed Smoking counseling  - none needed Evidence for depression or other mood disorder - none significant Most recent labs reviewed. I have personally reviewed and have noted: 1) the patient's medical and social history 2) The patient's current medications and supplements 3) The patient's height, weight, and BMI have been recorded in the chart   Vitamin D  deficiency Last vitamin D  Lab Results  Component Value Date   VD25OH 55.72 12/03/2023   Stable, cont oral replacement   Pre-diabetes Lab Results  Component Value Date   HGBA1C 6.2 12/03/2023   Stable, pt to continue current medical treatment  - diet, wt control   Osteopenia Mild by DXA mar 2024, plan to f/u 1-2  yrs  Hypothyroidism Lab Results  Component Value Date   TSH 0.66 12/03/2023   Stable, pt to continue levothyroxine  75 mcg qd   Hyperlipidemia Lab Results  Component Value Date   LDLCALC 44 12/03/2023   Stable, pt to continue repatha  140 mg bi weekly  Followup: Return in about 1 year (around 12/08/2024).  Lynwood Rush, MD 12/11/2023 4:19 PM [facility]

## 2023-12-11 ENCOUNTER — Encounter: Payer: Self-pay | Admitting: Internal Medicine

## 2023-12-11 NOTE — Assessment & Plan Note (Signed)
 Lab Results  Component Value Date   HGBA1C 6.2 12/03/2023   Stable, pt to continue current medical treatment  - diet, wt control

## 2023-12-11 NOTE — Assessment & Plan Note (Signed)

## 2023-12-11 NOTE — Assessment & Plan Note (Signed)
 Last vitamin D  Lab Results  Component Value Date   VD25OH 55.72 12/03/2023   Stable, cont oral replacement

## 2023-12-11 NOTE — Assessment & Plan Note (Signed)
 Mild by DXA mar 2024, plan to f/u 1-2 yrs

## 2023-12-11 NOTE — Assessment & Plan Note (Signed)
 Lab Results  Component Value Date   TSH 0.66 12/03/2023   Stable, pt to continue levothyroxine  75 mcg qd

## 2023-12-11 NOTE — Assessment & Plan Note (Signed)
 Lab Results  Component Value Date   LDLCALC 44 12/03/2023   Stable, pt to continue repatha  140 mg bi weekly

## 2023-12-14 ENCOUNTER — Ambulatory Visit (HOSPITAL_COMMUNITY): Attending: Orthopaedic Surgery

## 2023-12-14 ENCOUNTER — Encounter (HOSPITAL_COMMUNITY): Payer: Self-pay

## 2023-12-14 DIAGNOSIS — M25561 Pain in right knee: Secondary | ICD-10-CM | POA: Diagnosis not present

## 2023-12-14 DIAGNOSIS — M1711 Unilateral primary osteoarthritis, right knee: Secondary | ICD-10-CM | POA: Insufficient documentation

## 2023-12-14 DIAGNOSIS — R262 Difficulty in walking, not elsewhere classified: Secondary | ICD-10-CM | POA: Diagnosis not present

## 2023-12-14 DIAGNOSIS — M25661 Stiffness of right knee, not elsewhere classified: Secondary | ICD-10-CM | POA: Insufficient documentation

## 2023-12-14 NOTE — Therapy (Addendum)
 OUTPATIENT PHYSICAL THERAPY LOWER EXTREMITY TREATMENT Progress Note Reporting Period 10/14/23 to 12/14/23  See note below for Objective Data and Assessment of Progress/Goals.      Patient Name: Alisha Reed MRN: [id number] DOB:02/16/1961, 63 y.o., female Today's Date: 12/15/2023  END OF SESSION:  PT End of Session - 12/14/23 0805     Visit Number 14    Number of Visits 22    Date for PT Re-Evaluation 01/10/24    Authorization Type [employer]    Progress Note Due on Visit 18    PT Start Time 0805    PT Stop Time [phone]    PT Time Calculation (min) 50 min    Activity Tolerance Patient tolerated treatment well    Behavior During Therapy [facility] for tasks assessed/performed            Past Medical History:  Diagnosis Date   Allergy seasonal   Arthritis    hands,    Carpal tunnel syndrome on both sides    both hands surgeries 01/2019   GERD (gastroesophageal reflux disease)    Hypercholesterolemia    Hypothyroidism    Osteopenia 11/08/2018   Thyroid  disease    Past Surgical History:  Procedure Laterality Date   ABDOMINAL HYSTERECTOMY     BILATERAL CARPAL TUNNEL RELEASE Bilateral 01/18/2019   Procedure: BILATERAL CARPAL TUNNEL RELEASE;  Surgeon: Alisha Kay HERO, MD;  Location: [facility];  Service: Orthopedics;  Laterality: Bilateral;   BLADDER SUSPENSION N/A 08/10/2022   Procedure: TRANSVAGINAL TAPE (TVT) PROCEDURE;  Surgeon: Alisha Rosaline SAILOR, MD;  Location: [facility];  Service: Gynecology;  Laterality: N/A;  Total time requested is 1 hour.   CHOLECYSTECTOMY     COLONOSCOPY  2011   CYSTOSCOPY N/A 08/10/2022   Procedure: CYSTOSCOPY;  Surgeon: Alisha Rosaline SAILOR, MD;  Location: [facility];  Service: Gynecology;  Laterality: N/A;   FOOT SURGERY Bilateral    HYSTERECTOMY ABDOMINAL WITH SALPINGECTOMY     JOINT REPLACEMENT  10/11/2023   NASAL SINUS SURGERY     PARTIAL KNEE ARTHROPLASTY Right 10/11/2023    Procedure: ARTHROPLASTY, KNEE, UNICOMPARTMENTAL;  Surgeon: Alisha Kay HERO, MD;  Location: [facility];  Service: Orthopedics;  Laterality: Right;  RIGHT KNEE MEDIAL UKA vs TKA   TUBAL LIGATION     Patient Active Problem List   Diagnosis Date Noted   Status post right partial knee replacement 10/11/2023   Primary osteoarthritis of right knee 06/29/2023   Right tennis elbow 06/29/2023   Pain in right elbow 01/26/2023   Coronary artery calcification seen on CT scan 01/09/2023   Allergic rhinitis 12/09/2022   Microhematuria 12/09/2022   Rectal bleeding 03/13/2022   Other constipation 03/13/2022   Arthritis 12/03/2021   Stress incontinence 11/19/2020   S/p bilateral carpal tunnel release 03/28/2019   Chronic pain of right knee 12/29/2018   Right carpal tunnel syndrome 12/29/2018   Left carpal tunnel syndrome 12/29/2018   Osteopenia 11/08/2018   Pre-diabetes 05/02/2018   Acute pain of right shoulder 03/18/2018   Hyperlipidemia 11/04/2017   Vitamin D  deficiency 11/04/2017   S/P cholecystectomy 08/02/2017   Skier's thumb, right, subsequent encounter 03/15/2017   Chronic left shoulder pain 02/08/2017   Encounter for well adult exam with abnormal findings 09/24/2016   Hypothyroidism 09/24/2016    PCP: Alisha Rush, MD  REFERRING PROVIDER: Jerri Kay HERO, MD Next apt: 8/29  REFERRING DIAG: S03.348 (ICD-10-CM) - Status post right partial knee replacement  THERAPY  DIAG:  Acute pain of right knee  Stiffness of right knee, not elsewhere classified  Primary osteoarthritis of right knee  Difficulty in walking, not elsewhere classified  Rationale for Evaluation and Treatment: Rehabilitation  ONSET DATE: 10/11/23  SUBJECTIVE:   SUBJECTIVE STATEMENT: 12/14/23:  Continues to feel frustrated with posterior knee pain.  Stated she has been up since 3:00 this morning with burning from hip to knee.    Evaluation:  Goes by Alisha Reed; knee pain for a while; had injections for several years but then  stopped helping her.  She saw Dr. Jerri; had surgery 10/11/23; has CPM machine 6 hours a day.    PERTINENT HISTORY: OR nurse PAIN:  Are you having pain? Yes: NPRS scale: 6/10 Pain location: right knee; and down back of calf Pain description: sore, tight, stabbing and aching Aggravating factors: movement Relieving factors: oxycodone  with acetaminophen , ice man  PRECAUTIONS: None  RED FLAGS: None   WEIGHT BEARING RESTRICTIONS: No  FALLS:  Has patient fallen in last 6 months? No  LIVING ENVIRONMENT: Lives with: lives with their spouse Lives in: House/apartment Stairs: Yes: Internal: 14 steps; on left going up and External: 2 steps; on right going up, on left going up, and bilateral but cannot reach both Has following equipment at home: Vannie - 2 wheeled  OCCUPATION: OR nurse  PLOF: Independent  PATIENT GOALS: go back to work; going to [employer] in 5 weeks; [place]  NEXT MD VISIT: 10/26/23  OBJECTIVE:  Note: Objective measures were completed at Evaluation unless otherwise noted.  DIAGNOSTIC FINDINGS:   PATIENT SURVEYS:  LEFS 13/80 12/14/23: Lower Extremity Functional Score: 30 / 80 = 37.5 % Extreme difficulty/unable (0), Quite a bit of difficulty (1), Moderate difficulty (2), Little difficulty (3), No difficulty (4) Survey date:    Any of your usual work, housework or school activities   2. Usual hobbies, recreational or sporting activities   3. Getting into/out of the bath   4. Walking between rooms   5. Putting on socks/shoes   6. Squatting    7. Lifting an object, like a bag of groceries from the floor   8. Performing light activities around your home   9. Performing heavy activities around your home   10. Getting into/out of a car   11. Walking 2 blocks   12. Walking 1 mile   13. Going up/down 10 stairs (1 flight)   14. Standing for 1 hour   15.  sitting for 1 hour   16. Running on even ground   17. Running on uneven ground   18. Making sharp turns while running  fast   19. Hopping    20. Rolling over in bed   Score total:  13/80     COGNITION: Overall cognitive status: Within functional limits for tasks assessed     SENSATION: Some numbness outside of lower leg  EDEMA:  Normal for this time s/p partial TKA; bandage in place   POSTURE: rounded shoulders and forward head  PALPATION: General soreness and redness  LOWER EXTREMITY ROM:  Active ROM Right eval Left eval Right 10/21/23 Right  10/25/23 Right 11/08/23 Right 11/15/2023 Right 12/14/23  Hip flexion         Hip extension         Hip abduction         Hip adduction         Hip internal rotation         Hip external rotation  Knee flexion 76 AAROM  88 AAROM with rope 92 115 AROM 120  115 118  Knee extension -8  5 lacking from neutral 3 0 0 3  Ankle dorsiflexion         Ankle plantarflexion         Ankle inversion         Ankle eversion          (Blank rows = not tested)  LOWER EXTREMITY MMT:  MMT Right eval Left eval Right 11/15/2023 Left 11/15/2023 Right 12/14/23  Hip flexion   3- 4- 4/5  Hip extension     4/5  Hip abduction     4-  Hip adduction       Hip internal rotation       Hip external rotation       Knee flexion   3+ 4 4+  Knee extension Poor quad set  3+ 5 3+  Ankle dorsiflexion       Ankle plantarflexion       Ankle inversion       Ankle eversion        (Blank rows = not tested)   FUNCTIONAL TESTS:  5 times sit to stand: 41.01 2 minute walk test: 75 ft with RW 12/14/23: : 322ft with no AD  5STS 16.92  GAIT: Distance walked: 75 ft in clinic Assistive device utilized: Walker - 2 wheeled Level of assistance: SBA Comments: antalgic gait                                                                                                                                TREATMENT DATE:  12/14/23: Bike full revolution seat 9 x 5' L3 resistance LEFS: 30/80 359 no AD 5STS 16.92 MMT Poor distal quad activation Supine: TKE unable NMR  with russian estim x 8 min with verbal and tactile cueing 10/20 24 volts Half bolster unable initially so used full SAQ during [place] but returned to half last minute AROM 3-118 degrees Seated  LAQ 10 Piriformis stretch 3x 30  12/08/23: Bike full revolution seat 9 x 5' L3 resistance Educated desensitization techniques with different fabrics Seated treadmill walk 3x 1' Rockerboard Rt/Lt then Pf/Df x2 min Step down training 4in 15x 1HHA Step down 6in 10x 1 HHA STM to right knee followed by manual ROM to the same x 12' to decrease pain and improve soft tissue extensibility AROM 3-115 degrees Reviewed HEP to complete at home  12/02/23: Bike full revolution seat 9 x 5' L3 resistance Seated treadmill walk 3x 1' Gait training to improve arm swing x 274ft Cybex hamstring curls 4 plates 2 x 10 both concentric and right only eccentric 12 box hamstring stretch 3x 30 STM to right knee followed by manual ROM to the same x 15' to decrease pain and improve soft tissue extensibility Step down training 4in step 15x 1HHA    11/29/23 STM to right knee followed by  manual ROM to the same x 15' to decrease pain and improve soft tissue extensibility Instruction in foam rolling right hamstring Bike seat 9 x 5' full revolutions  12 box hamstring stretch 10 x 10' Cybex hamstring curls 2 plates 2 x 10 both concentric and right only eccentric    11/15/2023  -LEFS: 59 / 80 = 73.8 % -ROM -MMT -5x STS: 15.03 seconds - : 360ft -4in eccentric step downs with BUE support 2 x 10 -Sidelying hip adduction x 12 w/ 3'' -LAQ with 7.5 ankle weight x 10 with 3'' eccentric  11/11/2023  -Recumbent bike seat 8 x 4' -Knee drive on 2nd step with BUE support 10x5'' -Standing HS stretch 2 x 1' -7in step ups and down x 12 with no UE support -7in lateral step ups and down x 12 with no UE support -Stiff-legged deadlift x 12 w/ 10lb KB -Standing on RLE hip abduction with GTB at knees x 15 w/ 3'' -2in eccentric  step down x 12   11/08/23 Standing:  heelraises on incline 20X  Knee flexion 20X knee drives 10X on 12 step  Hamstring stretch 3X30 12 step  Tandem stance  Bike seat 8, full rev 5 minutes Gait without AD 226' Supine:  manual scar release along scar line  AROM 0-115/120   PATIENT EDUCATION:  Education details: Patient educated on exam findings, POC, scope of PT, HEP, and what to expect in the next few visits. Person educated: Patient Education method: Explanation, Demonstration, and Handouts Education comprehension: verbalized understanding, returned demonstration, verbal cues required, and tactile cues required  HOME EXERCISE PROGRAM: Access Code: [email or web address] URL: [email or web address] Date: 10/14/2023 Prepared by: [facility] - Rehab  Exercises - Supine Ankle Pumps  - 1 x daily - 7 x weekly - 1 sets - 15 reps - Supine Quadricep Sets  - 1 x daily - 7 x weekly - 1 sets - 15 reps - Supine Short Arc Quad  - 1 x daily - 7 x weekly - 1 sets - 15 reps - Supine Heel Slides  - 1 x daily - 7 x weekly - 1 sets - 15 reps - Seated Long Arc Quad  - 1 x daily - 7 x weekly - 1 sets - 15 reps - Seated Knee Flexion Stretch  - 1 x daily - 7 x weekly - 1 sets - 15 reps - Seated Heel Toe Raises  - 1 x daily - 7 x weekly - 1 sets - 15 reps - Supine Heel Slide with Strap  - 1 x daily - 7 x weekly - 1 sets - 10 reps - Long Sitting Calf Stretch with Strap  - 1 x daily - 7 x weekly - 1 sets - 5 reps - 20 sec hold  10/28/23: - Standing Terminal Knee Extension at Wall with Ball  - 2 x daily - 7 x weekly - 1 sets - 10 reps - 5 hold - Heel Toe Raises with Counter Support  - 2 x daily - 7 x weekly - 3 sets - 10 reps  11/15/2023 Access Code: [email or web address] URL: [email or web address] Date: 11/15/2023 Prepared by: Omega Bottcher  Exercises - Sidelying Hip Adduction  - 1 x daily - 7 x weekly - 3 sets - 10 reps - Supine Hip Adduction Isometric with Ball  - 1 x daily - 7 x weekly - 3 sets -  10 reps - 10 hold  12/14/23: - Supine Short Arc Quad  - 2 x daily - 7 x weekly -  3 sets - 10 reps - Seated Long Arc Quad with Hip Adduction  - 1 x daily - 7 x weekly - 3 sets - 10 reps  ASSESSMENT:  CLINICAL IMPRESSION: Reviewed goals with the following findings:  Pt has met 3/3 STGs and 3/6 LTGs.  Pt compliant with HEP, able to recall and demonstrate appropriate mechanics.  Objective testing shows increased cadence with , increased speed with 5STS, improved self perceived functional abilities with LEFS survey.  Pt continues to have poor distal quad activation with tendency to compensate with gluteal activation or hip flexor.  NMR complete this session with [place] estim, verbal, and tactile cueing to imporve quad sets.  Added SAQ and LAQ to HEP with handout given.  AROM 3-118 degrees.  Pt continues to be limited by pain especially at night with reports of hip and lateral leg burning.  Educated anatomy and nerve based system, added piriformis stretch this session.     Evaluation: Patient is a 63 y.o. female who was seen today for physical therapy evaluation and treatment for Z96.651 (ICD-10-CM) - Status post right partial knee replacement. Patient demonstrates muscle weakness, reduced ROM, and fascial restrictions which are likely contributing to symptoms of pain and are negatively impacting patient ability to perform ADLs and functional mobility tasks. Patient will benefit from skilled physical therapy services to address these deficits to reduce pain and improve level of function with ADLs and functional mobility tasks.   OBJECTIVE IMPAIRMENTS: Abnormal gait, decreased activity tolerance, decreased endurance, decreased mobility, difficulty walking, decreased ROM, decreased strength, increased fascial restrictions, impaired perceived functional ability, and pain.   ACTIVITY LIMITATIONS: carrying, lifting, bending, sitting, standing, squatting, sleeping, stairs, transfers, bed mobility, bathing,  toileting, dressing, locomotion level, and caring for others  PARTICIPATION LIMITATIONS: meal prep, cleaning, laundry, driving, shopping, community activity, and occupation    REHAB POTENTIAL: Good  CLINICAL DECISION MAKING: Evolving/moderate complexity  EVALUATION COMPLEXITY: Moderate   GOALS: Goals reviewed with patient? Yes  SHORT TERM GOALS: Target date: 11/04/2023 patient will be independent with initial HEP  Baseline: 12/14/23:  Reports compliance with HEP daily. Goal status: MET  2.  Patient will report 30% improvement overall  Baseline:  Goal status: MET  3.  Patient will increase right knee mobility to -5 to 90 to promote normal navigation of steps; step over step pattern  Baseline: see above Goal status: MET   LONG TERM GOALS: Target date: 12/06/2023  Patient will be independent in self management strategies to improve quality of life and functional outcomes. Baseline: 12/14/23:   Goal status: MET  2.  Patient will report 50% improvement overall  Baseline: 12/14/23:  Feels 100% improvements, no longer walking with AD, driving. Goal status: IN PROGRESS  3.  Patient will increase right knee mobility to -2 to 120 to promote normal navigation of steps; step over step pattern  Baseline: 12/14/23: 3-118 degrees Goal status: IN PROGRESS  4.  Patient will improve LEFS score by 27 points to demonstrate improved perceived function  Baseline: 13/80; 12/14/23: LEFS 30/80 Goal status: MET  5.  Patient will increase distance on 2 MWT by 100 cft with LRAD to demonstrate improved functional gait in community  Baseline: 75 ft with RW; 12/14/23: 366ft no AD Goal status: MET  6.   Patient will increase right leg MMT's to 5/5 to allow navigation of steps without gait deviation or loss of balance  Baseline: see above Goal status: in progress   PLAN:  PT FREQUENCY: 2x/week  PT DURATION:  6 weeks  PLANNED INTERVENTIONS: 97164- PT Re-evaluation, 97110-Therapeutic exercises,  97530- Therapeutic activity, [id number]- Neuromuscular re-education, 97535- Self Care, [id number]- Manual therapy, [phone]- Gait training, [phone]- Orthotic Fit/training, [phone]- Canalith repositioning, [id number]- Aquatic Therapy, [phone]- Splinting, 97597- Wound care (first 20 sq cm), 97598- Wound care (each additional 20 sq cm)Patient/Family education, Balance training, Stair training, Taping, Dry Needling, Joint mobilization, Joint manipulation, Spinal manipulation, Spinal mobilization, Scar mobilization, and DME instructions.   PLAN FOR NEXT SESSION: Continue russian to improve quad activation, half bolster SAQ and TKE based exercises and progress functional strengthening with manual as appropriate for pain control.  Augustin Mclean, LPTA/CLT; CBIS [phone]  7:34 AM, 12/15/23

## 2023-12-15 NOTE — Addendum Note (Signed)
 Addended byBETHA LEODIS NO S on: 12/15/2023 07:36 AM   Modules accepted: Orders

## 2023-12-16 ENCOUNTER — Ambulatory Visit (HOSPITAL_COMMUNITY)

## 2023-12-16 ENCOUNTER — Encounter (HOSPITAL_COMMUNITY): Payer: Self-pay

## 2023-12-16 ENCOUNTER — Other Ambulatory Visit: Payer: Self-pay | Admitting: Physician Assistant

## 2023-12-16 DIAGNOSIS — M25661 Stiffness of right knee, not elsewhere classified: Secondary | ICD-10-CM | POA: Diagnosis not present

## 2023-12-16 DIAGNOSIS — R262 Difficulty in walking, not elsewhere classified: Secondary | ICD-10-CM | POA: Diagnosis not present

## 2023-12-16 DIAGNOSIS — M25561 Pain in right knee: Secondary | ICD-10-CM | POA: Diagnosis not present

## 2023-12-16 DIAGNOSIS — M1711 Unilateral primary osteoarthritis, right knee: Secondary | ICD-10-CM | POA: Diagnosis not present

## 2023-12-16 MED ORDER — GABAPENTIN 300 MG PO CAPS
ORAL_CAPSULE | ORAL | 0 refills | Status: AC
Start: 2023-12-16 — End: ?

## 2023-12-16 NOTE — Therapy (Signed)
 OUTPATIENT PHYSICAL THERAPY LOWER EXTREMITY TREATMENT   Patient Name: Alisha Reed MRN: [id number] DOB:06-Jun-1960, 63 y.o., female Today's Date: 12/16/2023  END OF SESSION:  PT End of Session - 12/16/23 0845     Visit Number 15    Number of Visits 22    Date for PT Re-Evaluation 01/10/24    Authorization Type [place] [employer]    Progress Note Due on Visit 18    PT Start Time 0845    PT Stop Time 0925    PT Time Calculation (min) 40 min    Activity Tolerance Patient tolerated treatment well    Behavior During Therapy [facility] for tasks assessed/performed             Past Medical History:  Diagnosis Date   Allergy seasonal   Arthritis    hands,    Carpal tunnel syndrome on both sides    both hands surgeries 01/2019   GERD (gastroesophageal reflux disease)    Hypercholesterolemia    Hypothyroidism    Osteopenia 11/08/2018   Thyroid  disease    Past Surgical History:  Procedure Laterality Date   ABDOMINAL HYSTERECTOMY     BILATERAL CARPAL TUNNEL RELEASE Bilateral 01/18/2019   Procedure: BILATERAL CARPAL TUNNEL RELEASE;  Surgeon: Jerri Kay HERO, MD;  Location: [facility];  Service: Orthopedics;  Laterality: Bilateral;   BLADDER SUSPENSION N/A 08/10/2022   Procedure: TRANSVAGINAL TAPE (TVT) PROCEDURE;  Surgeon: Marilynne Rosaline SAILOR, MD;  Location: [facility];  Service: Gynecology;  Laterality: N/A;  Total time requested is 1 hour.   CHOLECYSTECTOMY     COLONOSCOPY  2011   CYSTOSCOPY N/A 08/10/2022   Procedure: CYSTOSCOPY;  Surgeon: Marilynne Rosaline SAILOR, MD;  Location: [facility];  Service: Gynecology;  Laterality: N/A;   FOOT SURGERY Bilateral    HYSTERECTOMY ABDOMINAL WITH SALPINGECTOMY     JOINT REPLACEMENT  10/11/2023   NASAL SINUS SURGERY     PARTIAL KNEE ARTHROPLASTY Right 10/11/2023   Procedure: ARTHROPLASTY, KNEE, UNICOMPARTMENTAL;  Surgeon: Jerri Kay HERO, MD;  Location: [facility];  Service: Orthopedics;   Laterality: Right;  RIGHT KNEE MEDIAL UKA vs TKA   TUBAL LIGATION     Patient Active Problem List   Diagnosis Date Noted   Status post right partial knee replacement 10/11/2023   Primary osteoarthritis of right knee 06/29/2023   Right tennis elbow 06/29/2023   Pain in right elbow 01/26/2023   Coronary artery calcification seen on CT scan 01/09/2023   Allergic rhinitis 12/09/2022   Microhematuria 12/09/2022   Rectal bleeding 03/13/2022   Other constipation 03/13/2022   Arthritis 12/03/2021   Stress incontinence 11/19/2020   S/p bilateral carpal tunnel release 03/28/2019   Chronic pain of right knee 12/29/2018   Right carpal tunnel syndrome 12/29/2018   Left carpal tunnel syndrome 12/29/2018   Osteopenia 11/08/2018   Pre-diabetes 05/02/2018   Acute pain of right shoulder 03/18/2018   Hyperlipidemia 11/04/2017   Vitamin D  deficiency 11/04/2017   S/P cholecystectomy 08/02/2017   Skier's thumb, right, subsequent encounter 03/15/2017   Chronic left shoulder pain 02/08/2017   Encounter for well adult exam with abnormal findings 09/24/2016   Hypothyroidism 09/24/2016    PCP: Lynwood Rush, MD  REFERRING PROVIDER: Jerri Kay HERO, MD Next apt: 8/29  REFERRING DIAG: S03.348 (ICD-10-CM) - Status post right partial knee replacement  THERAPY DIAG:  Acute pain of right knee  Stiffness of right knee, not elsewhere classified  Primary osteoarthritis of right  knee  Difficulty in walking, not elsewhere classified  Rationale for Evaluation and Treatment: Rehabilitation  ONSET DATE: 10/11/23  SUBJECTIVE:   SUBJECTIVE STATEMENT: Pt states she is at a 7/10 pain this morning. Pt states she has been up since 12am with pain. Pt states she is tired of this pain and is getting some gabapentin  to help with pain.  Evaluation:  Goes by [name]; knee pain for a while; had injections for several years but then stopped helping her.  She saw Dr. Jerri; had surgery 10/11/23; has CPM machine 6 hours a day.     PERTINENT HISTORY: OR nurse PAIN:  Are you having pain? Yes: NPRS scale: 6/10 Pain location: right knee; and down back of calf Pain description: sore, tight, stabbing and aching Aggravating factors: movement Relieving factors: oxycodone  with acetaminophen , ice man  PRECAUTIONS: None  RED FLAGS: None   WEIGHT BEARING RESTRICTIONS: No  FALLS:  Has patient fallen in last 6 months? No  LIVING ENVIRONMENT: Lives with: lives with their spouse Lives in: House/apartment Stairs: Yes: Internal: 14 steps; on left going up and External: 2 steps; on right going up, on left going up, and bilateral but cannot reach both Has following equipment at home: Vannie - 2 wheeled  OCCUPATION: OR nurse  PLOF: Independent  PATIENT GOALS: go back to work; going to [employer] in 5 weeks; [place]  NEXT MD VISIT: 10/26/23  OBJECTIVE:  Note: Objective measures were completed at Evaluation unless otherwise noted.  DIAGNOSTIC FINDINGS:   PATIENT SURVEYS:  LEFS 13/80 12/14/23: Lower Extremity Functional Score: 30 / 80 = 37.5 % Extreme difficulty/unable (0), Quite a bit of difficulty (1), Moderate difficulty (2), Little difficulty (3), No difficulty (4) Survey date:    Any of your usual work, housework or school activities   2. Usual hobbies, recreational or sporting activities   3. Getting into/out of the bath   4. Walking between rooms   5. Putting on socks/shoes   6. Squatting    7. Lifting an object, like a bag of groceries from the floor   8. Performing light activities around your home   9. Performing heavy activities around your home   10. Getting into/out of a car   11. Walking 2 blocks   12. Walking 1 mile   13. Going up/down 10 stairs (1 flight)   14. Standing for 1 hour   15.  sitting for 1 hour   16. Running on even ground   17. Running on uneven ground   18. Making sharp turns while running fast   19. Hopping    20. Rolling over in bed   Score total:  13/80      COGNITION: Overall cognitive status: Within functional limits for tasks assessed     SENSATION: Some numbness outside of lower leg  EDEMA:  Normal for this time s/p partial TKA; bandage in place   POSTURE: rounded shoulders and forward head  PALPATION: General soreness and redness  LOWER EXTREMITY ROM:  Active ROM Right eval Left eval Right 10/21/23 Right  10/25/23 Right 11/08/23 Right 11/15/2023 Right 12/14/23  Hip flexion         Hip extension         Hip abduction         Hip adduction         Hip internal rotation         Hip external rotation         Knee flexion 76 AAROM  88 AAROM with rope 92 115 AROM 120  115 118  Knee extension -8  5 lacking from neutral 3 0 0 3  Ankle dorsiflexion         Ankle plantarflexion         Ankle inversion         Ankle eversion          (Blank rows = not tested)  LOWER EXTREMITY MMT:  MMT Right eval Left eval Right 11/15/2023 Left 11/15/2023 Right 12/14/23  Hip flexion   3- 4- 4/5  Hip extension     4/5  Hip abduction     4-  Hip adduction       Hip internal rotation       Hip external rotation       Knee flexion   3+ 4 4+  Knee extension Poor quad set  3+ 5 3+  Ankle dorsiflexion       Ankle plantarflexion       Ankle inversion       Ankle eversion        (Blank rows = not tested)   FUNCTIONAL TESTS:  5 times sit to stand: 41.01 2 minute walk test: 75 ft with RW 12/14/23: : 350ft with no AD  5STS 16.92  GAIT: Distance walked: 75 ft in clinic Assistive device utilized: Walker - 2 wheeled Level of assistance: SBA Comments: antalgic gait                                                                                                                                TREATMENT DATE:  12/16/2023   Therapeutic Exercise: -Supine bridges 2 sets of 10 reps, 5 second holds, second set with hamstring curls, pt cued for max hip extension -Stationary Bike, 5 minutes, level 1 resistance, seat 9 -Step up and overs, 8 inch  step, 1 set of 8 reps bilaterally -Lateral step up and overs, 8 inch step, 1 set of 7 reps, bilaterally -Lateral stepping 3 laps 20 feet per lap, second 2 with RTB around ankles, pt cued for upright posture -Knee drive stretch 10 reps, 10 second holds -Prone quad stretch, 2 sets of 30 seconds    12/14/23: Bike full revolution seat 9 x 5' L3 resistance LEFS: 30/80 359 no AD 5STS 16.92 MMT Poor distal quad activation Supine: TKE unable NMR with russian estim x 8 min with verbal and tactile cueing 10/20 24 volts Half bolster unable initially so used full SAQ during [place] but returned to half last minute AROM 3-118 degrees Seated  LAQ 10 Piriformis stretch 3x 30  12/08/23: Bike full revolution seat 9 x 5' L3 resistance Educated desensitization techniques with different fabrics Seated treadmill walk 3x 1' Rockerboard Rt/Lt then Pf/Df x2 min Step down training 4in 15x 1HHA Step down 6in 10x 1 HHA STM to right knee followed by manual ROM to the same x 12' to decrease pain and  improve soft tissue extensibility AROM 3-115 degrees Reviewed HEP to complete at home   PATIENT EDUCATION:  Education details: Patient educated on exam findings, POC, scope of PT, HEP, and what to expect in the next few visits. Person educated: Patient Education method: Explanation, Demonstration, and Handouts Education comprehension: verbalized understanding, returned demonstration, verbal cues required, and tactile cues required  HOME EXERCISE PROGRAM: Access Code: [email or web address] URL: [email or web address] Date: 10/14/2023 Prepared by: [facility] - Rehab  Exercises - Supine Ankle Pumps  - 1 x daily - 7 x weekly - 1 sets - 15 reps - Supine Quadricep Sets  - 1 x daily - 7 x weekly - 1 sets - 15 reps - Supine Short Arc Quad  - 1 x daily - 7 x weekly - 1 sets - 15 reps - Supine Heel Slides  - 1 x daily - 7 x weekly - 1 sets - 15 reps - Seated Long Arc Quad  - 1 x daily - 7 x weekly - 1 sets - 15  reps - Seated Knee Flexion Stretch  - 1 x daily - 7 x weekly - 1 sets - 15 reps - Seated Heel Toe Raises  - 1 x daily - 7 x weekly - 1 sets - 15 reps - Supine Heel Slide with Strap  - 1 x daily - 7 x weekly - 1 sets - 10 reps - Long Sitting Calf Stretch with Strap  - 1 x daily - 7 x weekly - 1 sets - 5 reps - 20 sec hold  10/28/23: - Standing Terminal Knee Extension at Wall with Ball  - 2 x daily - 7 x weekly - 1 sets - 10 reps - 5 hold - Heel Toe Raises with Counter Support  - 2 x daily - 7 x weekly - 3 sets - 10 reps  11/15/2023 Access Code: [email or web address] URL: [email or web address] Date: 11/15/2023 Prepared by: [employer]cher  Exercises - Sidelying Hip Adduction  - 1 x daily - 7 x weekly - 3 sets - 10 reps - Supine Hip Adduction Isometric with Ball  - 1 x daily - 7 x weekly - 3 sets - 10 reps - 10 hold  12/14/23: - Supine Short Arc Quad  - 2 x daily - 7 x weekly - 3 sets - 10 reps - Seated Long Arc Quad with Hip Adduction  - 1 x daily - 7 x weekly - 3 sets - 10 reps  ASSESSMENT:  CLINICAL IMPRESSION: Patient continues to demonstrate Right knee pain, decreased RLE strength, decreased gait quality and balance. Patient also demonstrates good endurance with aerobic based exercise during today's session. Patient able to progress dynamic balance and core activation exercises today with step up variations and bridge hamstring curl, good performance with verbal cueing. Patient would continue to benefit from skilled physical therapy for decreased RLE pain, increased endurance with ambulation, increased RLE strength, and improved balance for improved quality of life, improved independence with right knee pain management and continued progress towards therapy goals.    Evaluation: Patient is a 63 y.o. female who was seen today for physical therapy evaluation and treatment for Z96.651 (ICD-10-CM) - Status post right partial knee replacement. Patient demonstrates muscle weakness, reduced  ROM, and fascial restrictions which are likely contributing to symptoms of pain and are negatively impacting patient ability to perform ADLs and functional mobility tasks. Patient will benefit from skilled physical therapy services to address these deficits to reduce pain and improve level of function with ADLs and  functional mobility tasks.   OBJECTIVE IMPAIRMENTS: Abnormal gait, decreased activity tolerance, decreased endurance, decreased mobility, difficulty walking, decreased ROM, decreased strength, increased fascial restrictions, impaired perceived functional ability, and pain.   ACTIVITY LIMITATIONS: carrying, lifting, bending, sitting, standing, squatting, sleeping, stairs, transfers, bed mobility, bathing, toileting, dressing, locomotion level, and caring for others  PARTICIPATION LIMITATIONS: meal prep, cleaning, laundry, driving, shopping, community activity, and occupation    REHAB POTENTIAL: Good  CLINICAL DECISION MAKING: Evolving/moderate complexity  EVALUATION COMPLEXITY: Moderate   GOALS: Goals reviewed with patient? Yes  SHORT TERM GOALS: Target date: 11/04/2023 patient will be independent with initial HEP  Baseline: 12/14/23:  Reports compliance with HEP daily. Goal status: MET  2.  Patient will report 30% improvement overall  Baseline:  Goal status: MET  3.  Patient will increase right knee mobility to -5 to 90 to promote normal navigation of steps; step over step pattern  Baseline: see above Goal status: MET   LONG TERM GOALS: Target date: 12/06/2023  Patient will be independent in self management strategies to improve quality of life and functional outcomes. Baseline: 12/14/23:   Goal status: MET  2.  Patient will report 50% improvement overall  Baseline: 12/14/23:  Feels 100% improvements, no longer walking with AD, driving. Goal status: IN PROGRESS  3.  Patient will increase right knee mobility to -2 to 120 to promote normal navigation of steps; step  over step pattern  Baseline: 12/14/23: 3-118 degrees Goal status: IN PROGRESS  4.  Patient will improve LEFS score by 27 points to demonstrate improved perceived function  Baseline: 13/80; 12/14/23: LEFS 30/80 Goal status: MET  5.  Patient will increase distance on 2 MWT by 100 cft with LRAD to demonstrate improved functional gait in community  Baseline: 75 ft with RW; 12/14/23: 345ft no AD Goal status: MET  6.   Patient will increase right leg MMT's to 5/5 to allow navigation of steps without gait deviation or loss of balance  Baseline: see above Goal status: in progress   PLAN:  PT FREQUENCY: 2x/week  PT DURATION: 6 weeks  PLANNED INTERVENTIONS: [id number]- PT Re-evaluation, 97110-Therapeutic exercises, 97530- Therapeutic activity, 97112- Neuromuscular re-education, 97535- Self Care, [id number]- Manual therapy, [id number]- Gait training, [phone]- Orthotic Fit/training, [id number]- Canalith repositioning, [id number]- Aquatic Therapy, 97760- Splinting, 97597- Wound care (first 20 sq cm), 97598- Wound care (each additional 20 sq cm)Patient/Family education, Balance training, Stair training, Taping, Dry Needling, Joint mobilization, Joint manipulation, Spinal manipulation, Spinal mobilization, Scar mobilization, and DME instructions.   PLAN FOR NEXT SESSION: Progress dynamic balance and increase intensity of RLE strengthening.  Lang Ada, PT, DPT [facility] Office: [phone] 9:33 AM, 12/16/23

## 2023-12-20 ENCOUNTER — Other Ambulatory Visit (HOSPITAL_COMMUNITY): Payer: Self-pay

## 2023-12-21 ENCOUNTER — Ambulatory Visit (HOSPITAL_COMMUNITY)

## 2023-12-21 ENCOUNTER — Encounter (HOSPITAL_COMMUNITY): Payer: Self-pay

## 2023-12-21 DIAGNOSIS — R262 Difficulty in walking, not elsewhere classified: Secondary | ICD-10-CM | POA: Diagnosis not present

## 2023-12-21 DIAGNOSIS — M25561 Pain in right knee: Secondary | ICD-10-CM

## 2023-12-21 DIAGNOSIS — M25661 Stiffness of right knee, not elsewhere classified: Secondary | ICD-10-CM

## 2023-12-21 DIAGNOSIS — M1711 Unilateral primary osteoarthritis, right knee: Secondary | ICD-10-CM

## 2023-12-21 NOTE — Therapy (Signed)
 OUTPATIENT PHYSICAL THERAPY LOWER EXTREMITY TREATMENT   Patient Name: Alisha Reed MRN: [id number] DOB:28-Nov-1960, 63 y.o., female Today's Date: 12/21/2023  END OF SESSION:  PT End of Session - 12/21/23 1028     Visit Number 16    Number of Visits 22    Date for PT Re-Evaluation 01/10/24    Authorization Type [employer]    Progress Note Due on Visit 18    PT Start Time 1028   late arrival   PT Stop Time 1058    PT Time Calculation (min) 30 min    Activity Tolerance Patient tolerated treatment well    Behavior During Therapy [facility] for tasks assessed/performed              Past Medical History:  Diagnosis Date   Allergy seasonal   Arthritis    hands,    Carpal tunnel syndrome on both sides    both hands surgeries 01/2019   GERD (gastroesophageal reflux disease)    Hypercholesterolemia    Hypothyroidism    Osteopenia 11/08/2018   Thyroid  disease    Past Surgical History:  Procedure Laterality Date   ABDOMINAL HYSTERECTOMY     BILATERAL CARPAL TUNNEL RELEASE Bilateral 01/18/2019   Procedure: BILATERAL CARPAL TUNNEL RELEASE;  Surgeon: Jerri Kay HERO, MD;  Location: [facility];  Service: Orthopedics;  Laterality: Bilateral;   BLADDER SUSPENSION N/A 08/10/2022   Procedure: TRANSVAGINAL TAPE (TVT) PROCEDURE;  Surgeon: Marilynne Rosaline SAILOR, MD;  Location: [facility];  Service: Gynecology;  Laterality: N/A;  Total time requested is 1 hour.   CHOLECYSTECTOMY     COLONOSCOPY  2011   CYSTOSCOPY N/A 08/10/2022   Procedure: CYSTOSCOPY;  Surgeon: Marilynne Rosaline SAILOR, MD;  Location: [facility];  Service: Gynecology;  Laterality: N/A;   FOOT SURGERY Bilateral    HYSTERECTOMY ABDOMINAL WITH SALPINGECTOMY     JOINT REPLACEMENT  10/11/2023   NASAL SINUS SURGERY     PARTIAL KNEE ARTHROPLASTY Right 10/11/2023   Procedure: ARTHROPLASTY, KNEE, UNICOMPARTMENTAL;  Surgeon: Jerri Kay HERO, MD;  Location: [facility];  Service:  Orthopedics;  Laterality: Right;  RIGHT KNEE MEDIAL UKA vs TKA   TUBAL LIGATION     Patient Active Problem List   Diagnosis Date Noted   Status post right partial knee replacement 10/11/2023   Primary osteoarthritis of right knee 06/29/2023   Right tennis elbow 06/29/2023   Pain in right elbow 01/26/2023   Coronary artery calcification seen on CT scan 01/09/2023   Allergic rhinitis 12/09/2022   Microhematuria 12/09/2022   Rectal bleeding 03/13/2022   Other constipation 03/13/2022   Arthritis 12/03/2021   Stress incontinence 11/19/2020   S/p bilateral carpal tunnel release 03/28/2019   Chronic pain of right knee 12/29/2018   Right carpal tunnel syndrome 12/29/2018   Left carpal tunnel syndrome 12/29/2018   Osteopenia 11/08/2018   Pre-diabetes 05/02/2018   Acute pain of right shoulder 03/18/2018   Hyperlipidemia 11/04/2017   Vitamin D  deficiency 11/04/2017   S/P cholecystectomy 08/02/2017   Skier's thumb, right, subsequent encounter 03/15/2017   Chronic left shoulder pain 02/08/2017   Encounter for well adult exam with abnormal findings 09/24/2016   Hypothyroidism 09/24/2016    PCP: Lynwood Rush, MD  REFERRING PROVIDER: Jerri Kay HERO, MD Next apt: 8/29  REFERRING DIAG: S03.348 (ICD-10-CM) - Status post right partial knee replacement  THERAPY DIAG:  Acute pain of right knee  Stiffness of right knee, not elsewhere classified  Primary osteoarthritis of right knee  Difficulty in walking, not elsewhere classified  Rationale for Evaluation and Treatment: Rehabilitation  ONSET DATE: 10/11/23  SUBJECTIVE:   SUBJECTIVE STATEMENT: Pt states she is at a 2/10 pain this morning. Pt states she feels she is finally making the turn, feels she might actually be able to return to work. Pt states the pain is much better as she has started taking gabapentin .  Evaluation:  Goes by [name]; knee pain for a while; had injections for several years but then stopped helping her.  She saw Dr.  Jerri; had surgery 10/11/23; has CPM machine 6 hours a day.    PERTINENT HISTORY: OR nurse PAIN:  Are you having pain? Yes: NPRS scale: 6/10 Pain location: right knee; and down back of calf Pain description: sore, tight, stabbing and aching Aggravating factors: movement Relieving factors: oxycodone  with acetaminophen , ice man  PRECAUTIONS: None  RED FLAGS: None   WEIGHT BEARING RESTRICTIONS: No  FALLS:  Has patient fallen in last 6 months? No  LIVING ENVIRONMENT: Lives with: lives with their spouse Lives in: House/apartment Stairs: Yes: Internal: 14 steps; on left going up and External: 2 steps; on right going up, on left going up, and bilateral but cannot reach both Has following equipment at home: Vannie - 2 wheeled  OCCUPATION: OR nurse  PLOF: Independent  PATIENT GOALS: go back to work; going to [employer] in 5 weeks; [place]  NEXT MD VISIT: 10/26/23  OBJECTIVE:  Note: Objective measures were completed at Evaluation unless otherwise noted.  DIAGNOSTIC FINDINGS:   PATIENT SURVEYS:  LEFS 13/80 12/14/23: Lower Extremity Functional Score: 30 / 80 = 37.5 % Extreme difficulty/unable (0), Quite a bit of difficulty (1), Moderate difficulty (2), Little difficulty (3), No difficulty (4) Survey date:    Any of your usual work, housework or school activities   2. Usual hobbies, recreational or sporting activities   3. Getting into/out of the bath   4. Walking between rooms   5. Putting on socks/shoes   6. Squatting    7. Lifting an object, like a bag of groceries from the floor   8. Performing light activities around your home   9. Performing heavy activities around your home   10. Getting into/out of a car   11. Walking 2 blocks   12. Walking 1 mile   13. Going up/down 10 stairs (1 flight)   14. Standing for 1 hour   15.  sitting for 1 hour   16. Running on even ground   17. Running on uneven ground   18. Making sharp turns while running fast   19. Hopping    20.  Rolling over in bed   Score total:  13/80     COGNITION: Overall cognitive status: Within functional limits for tasks assessed     SENSATION: Some numbness outside of lower leg  EDEMA:  Normal for this time s/p partial TKA; bandage in place   POSTURE: rounded shoulders and forward head  PALPATION: General soreness and redness  LOWER EXTREMITY ROM:  Active ROM Right eval Left eval Right 10/21/23 Right  10/25/23 Right 11/08/23 Right 11/15/2023 Right 12/14/23  Hip flexion         Hip extension         Hip abduction         Hip adduction         Hip internal rotation         Hip external rotation  Knee flexion 76 AAROM  88 AAROM with rope 92 115 AROM 120  115 118  Knee extension -8  5 lacking from neutral 3 0 0 3  Ankle dorsiflexion         Ankle plantarflexion         Ankle inversion         Ankle eversion          (Blank rows = not tested)  LOWER EXTREMITY MMT:  MMT Right eval Left eval Right 11/15/2023 Left 11/15/2023 Right 12/14/23  Hip flexion   3- 4- 4/5  Hip extension     4/5  Hip abduction     4-  Hip adduction       Hip internal rotation       Hip external rotation       Knee flexion   3+ 4 4+  Knee extension Poor quad set  3+ 5 3+  Ankle dorsiflexion       Ankle plantarflexion       Ankle inversion       Ankle eversion        (Blank rows = not tested)   FUNCTIONAL TESTS:  5 times sit to stand: 41.01 2 minute walk test: 75 ft with RW 12/14/23: : 392ft with no AD  5STS 16.92  GAIT: Distance walked: 75 ft in clinic Assistive device utilized: Walker - 2 wheeled Level of assistance: SBA Comments: antalgic gait                                                                                                                                TREATMENT DATE:  12/21/2023  Therapeutic Exercise: -Stationary Bike, 3 minutes, level 1 resistance, seat 9 -Hamstring curls, pt cued for max knee flexion, 3 sets of 10 reps, plate 4, plate 5 last two  sets, pt cued for equal distribution of resistance -Leg Press, 3 sets of 10 reps, plate 3>plate 4, pt cued for avoidance of knee extension locking, eccentric control, and decreased knee valgus  Therapeutic Activity: -Sled pushes/pulls 50lbs, 3 laps, 50 foot laps, pt cued for symmetrical stride lengths, especially during pulling -Lateral stepping 3 laps 20 steps per lap, with RTB around ankles, pt cued for upright posture   12/16/2023  Therapeutic Exercise: -Supine bridges 2 sets of 10 reps, 5 second holds, second set with hamstring curls, pt cued for max hip extension -Stationary Bike, 5 minutes, level 1 resistance, seat 9 -Step up and overs, 8 inch step, 1 set of 8 reps bilaterally -Lateral step up and overs, 8 inch step, 1 set of 7 reps, bilaterally -Lateral stepping 3 laps 20 feet per lap, second 2 with RTB around ankles, pt cued for upright posture -Knee drive stretch 10 reps, 10 second holds -Prone quad stretch, 2 sets of 30 seconds    12/14/23: Bike full revolution seat 9 x 5' L3 resistance LEFS: 30/80 359 no AD 5STS 16.92  MMT Poor distal quad activation Supine: TKE unable NMR with russian estim x 8 min with verbal and tactile cueing 10/20 24 volts Half bolster unable initially so used full SAQ during [place] but returned to half last minute AROM 3-118 degrees Seated  LAQ 10 Piriformis stretch 3x 30   PATIENT EDUCATION:  Education details: Patient educated on exam findings, POC, scope of PT, HEP, and what to expect in the next few visits. Person educated: Patient Education method: Explanation, Demonstration, and Handouts Education comprehension: verbalized understanding, returned demonstration, verbal cues required, and tactile cues required  HOME EXERCISE PROGRAM: Access Code: [email or web address] URL: [email or web address] Date: 10/14/2023 Prepared by: [facility] - Re[facility]  Exercises - Supine Ankle Pumps  - 1 x daily - 7 x weekly - 1 sets - 15 reps - Supine  Quadricep Sets  - 1 x daily - 7 x weekly - 1 sets - 15 reps - Supine Short Arc Quad  - 1 x daily - 7 x weekly - 1 sets - 15 reps - Supine Heel Slides  - 1 x daily - 7 x weekly - 1 sets - 15 reps - Seated Long Arc Quad  - 1 x daily - 7 x weekly - 1 sets - 15 reps - Seated Knee Flexion Stretch  - 1 x daily - 7 x weekly - 1 sets - 15 reps - Seated Heel Toe Raises  - 1 x daily - 7 x weekly - 1 sets - 15 reps - Supine Heel Slide with Strap  - 1 x daily - 7 x weekly - 1 sets - 10 reps - Long Sitting Calf Stretch with Strap  - 1 x daily - 7 x weekly - 1 sets - 5 reps - 20 sec hold  10/28/23: - Standing Terminal Knee Extension at Wall with Ball  - 2 x daily - 7 x weekly - 1 sets - 10 reps - 5 hold - Heel Toe Raises with Counter Support  - 2 x daily - 7 x weekly - 3 sets - 10 reps  11/15/2023 Access Code: [email or web address] URL: [email or web address] Date: 11/15/2023 Prepared by: Omega Bottcher  Exercises - Sidelying Hip Adduction  - 1 x daily - 7 x weekly - 3 sets - 10 reps - Supine Hip Adduction Isometric with Ball  - 1 x daily - 7 x weekly - 3 sets - 10 reps - 10 hold  12/14/23: - Supine Short Arc Quad  - 2 x daily - 7 x weekly - 3 sets - 10 reps - Seated Long Arc Quad with Hip Adduction  - 1 x daily - 7 x weekly - 3 sets - 10 reps  ASSESSMENT:  CLINICAL IMPRESSION: Patient demonstrates much improved since last session with decreased pain and improved gait pattern. Pt continues to demonstrate decreased RLE strength and balance. Patient able to initiate dynamic balance and core activation exercises today with sled pushes and resistance machines for hamstrings and quads, good performance with verbal cueing. Patient would continue to benefit from skilled physical therapy for increased endurance with ambulation, increased RLE strength, and improved balance for improved quality of life, improved independence with gait training and continued progress towards therapy goals.     Evaluation:  Patient is a 63 y.o. female who was seen today for physical therapy evaluation and treatment for Z96.651 (ICD-10-CM) - Status post right partial knee replacement. Patient demonstrates muscle weakness, reduced ROM, and fascial restrictions which are likely contributing to symptoms of pain and are negatively impacting patient  ability to perform ADLs and functional mobility tasks. Patient will benefit from skilled physical therapy services to address these deficits to reduce pain and improve level of function with ADLs and functional mobility tasks.   OBJECTIVE IMPAIRMENTS: Abnormal gait, decreased activity tolerance, decreased endurance, decreased mobility, difficulty walking, decreased ROM, decreased strength, increased fascial restrictions, impaired perceived functional ability, and pain.   ACTIVITY LIMITATIONS: carrying, lifting, bending, sitting, standing, squatting, sleeping, stairs, transfers, bed mobility, bathing, toileting, dressing, locomotion level, and caring for others  PARTICIPATION LIMITATIONS: meal prep, cleaning, laundry, driving, shopping, community activity, and occupation    REHAB POTENTIAL: Good  CLINICAL DECISION MAKING: Evolving/moderate complexity  EVALUATION COMPLEXITY: Moderate   GOALS: Goals reviewed with patient? Yes  SHORT TERM GOALS: Target date: 11/04/2023 patient will be independent with initial HEP  Baseline: 12/14/23:  Reports compliance with HEP daily. Goal status: MET  2.  Patient will report 30% improvement overall  Baseline:  Goal status: MET  3.  Patient will increase right knee mobility to -5 to 90 to promote normal navigation of steps; step over step pattern  Baseline: see above Goal status: MET   LONG TERM GOALS: Target date: 12/06/2023  Patient will be independent in self management strategies to improve quality of life and functional outcomes. Baseline: 12/14/23:   Goal status: MET  2.  Patient will report 50% improvement  overall  Baseline: 12/14/23:  Feels 100% improvements, no longer walking with AD, driving. Goal status: IN PROGRESS  3.  Patient will increase right knee mobility to -2 to 120 to promote normal navigation of steps; step over step pattern  Baseline: 12/14/23: 3-118 degrees Goal status: IN PROGRESS  4.  Patient will improve LEFS score by 27 points to demonstrate improved perceived function  Baseline: 13/80; 12/14/23: LEFS 30/80 Goal status: MET  5.  Patient will increase distance on 2 MWT by 100 cft with LRAD to demonstrate improved functional gait in community  Baseline: 75 ft with RW; 12/14/23: 358ft no AD Goal status: MET  6.   Patient will increase right leg MMT's to 5/5 to allow navigation of steps without gait deviation or loss of balance  Baseline: see above Goal status: in progress   PLAN:  PT FREQUENCY: 2x/week  PT DURATION: 6 weeks  PLANNED INTERVENTIONS: [id number]- PT Re-evaluation, 97110-Therapeutic exercises, 97530- Therapeutic activity, 97112- Neuromuscular re-education, 97535- Self Care, [id number]- Manual therapy, [id number]- Gait training, [phone]- Orthotic Fit/training, [phone]- Canalith repositioning, [id number]- Aquatic Therapy, 97760- Splinting, 97597- Wound care (first 20 sq cm), 97598- Wound care (each additional 20 sq cm)Patient/Family education, Balance training, Stair training, Taping, Dry Needling, Joint mobilization, Joint manipulation, Spinal manipulation, Spinal mobilization, Scar mobilization, and DME instructions.   PLAN FOR NEXT SESSION: Progress dynamic balance and increase intensity of RLE strengthening. Progress to lunge movements, sls activities  Lang Ada, PT, DPT [facility] Office: [phone] 11:01 AM, 12/21/23

## 2023-12-22 ENCOUNTER — Encounter (HOSPITAL_COMMUNITY)

## 2023-12-24 ENCOUNTER — Ambulatory Visit (HOSPITAL_COMMUNITY)

## 2023-12-24 DIAGNOSIS — R262 Difficulty in walking, not elsewhere classified: Secondary | ICD-10-CM | POA: Diagnosis not present

## 2023-12-24 DIAGNOSIS — M1711 Unilateral primary osteoarthritis, right knee: Secondary | ICD-10-CM

## 2023-12-24 DIAGNOSIS — M25661 Stiffness of right knee, not elsewhere classified: Secondary | ICD-10-CM

## 2023-12-24 DIAGNOSIS — M25561 Pain in right knee: Secondary | ICD-10-CM | POA: Diagnosis not present

## 2023-12-24 NOTE — Therapy (Signed)
 OUTPATIENT PHYSICAL THERAPY LOWER EXTREMITY TREATMENT   Patient Name: Alisha Reed MRN: [id number] DOB:28-Apr-1961, 63 y.o., female Today's Date: 12/24/2023  END OF SESSION:  PT End of Session - 12/24/23 0842     Visit Number 17    Number of Visits 22    Date for PT Re-Evaluation 01/10/24    Authorization Type [employer]    Progress Note Due on Visit 18    PT Start Time [phone]    PT Stop Time 0922    PT Time Calculation (min) 40 min    Activity Tolerance Patient tolerated treatment well    Behavior During Therapy [facility] for tasks assessed/performed              Past Medical History:  Diagnosis Date   Allergy seasonal   Arthritis    hands,    Carpal tunnel syndrome on both sides    both hands surgeries 01/2019   GERD (gastroesophageal reflux disease)    Hypercholesterolemia    Hypothyroidism    Osteopenia 11/08/2018   Thyroid  disease    Past Surgical History:  Procedure Laterality Date   ABDOMINAL HYSTERECTOMY     BILATERAL CARPAL TUNNEL RELEASE Bilateral 01/18/2019   Procedure: BILATERAL CARPAL TUNNEL RELEASE;  Surgeon: Jerri Kay HERO, MD;  Location: [facility];  Service: Orthopedics;  Laterality: Bilateral;   BLADDER SUSPENSION N/A 08/10/2022   Procedure: TRANSVAGINAL TAPE (TVT) PROCEDURE;  Surgeon: Marilynne Rosaline SAILOR, MD;  Location: [facility];  Service: Gynecology;  Laterality: N/A;  Total time requested is 1 hour.   CHOLECYSTECTOMY     COLONOSCOPY  2011   CYSTOSCOPY N/A 08/10/2022   Procedure: CYSTOSCOPY;  Surgeon: Marilynne Rosaline SAILOR, MD;  Location: [facility];  Service: Gynecology;  Laterality: N/A;   FOOT SURGERY Bilateral    HYSTERECTOMY ABDOMINAL WITH SALPINGECTOMY     JOINT REPLACEMENT  10/11/2023   NASAL SINUS SURGERY     PARTIAL KNEE ARTHROPLASTY Right 10/11/2023   Procedure: ARTHROPLASTY, KNEE, UNICOMPARTMENTAL;  Surgeon: Jerri Kay HERO, MD;  Location: [facility];  Service: Orthopedics;   Laterality: Right;  RIGHT KNEE MEDIAL UKA vs TKA   TUBAL LIGATION     Patient Active Problem List   Diagnosis Date Noted   Status post right partial knee replacement 10/11/2023   Primary osteoarthritis of right knee 06/29/2023   Right tennis elbow 06/29/2023   Pain in right elbow 01/26/2023   Coronary artery calcification seen on CT scan 01/09/2023   Allergic rhinitis 12/09/2022   Microhematuria 12/09/2022   Rectal bleeding 03/13/2022   Other constipation 03/13/2022   Arthritis 12/03/2021   Stress incontinence 11/19/2020   S/p bilateral carpal tunnel release 03/28/2019   Chronic pain of right knee 12/29/2018   Right carpal tunnel syndrome 12/29/2018   Left carpal tunnel syndrome 12/29/2018   Osteopenia 11/08/2018   Pre-diabetes 05/02/2018   Acute pain of right shoulder 03/18/2018   Hyperlipidemia 11/04/2017   Vitamin D  deficiency 11/04/2017   S/P cholecystectomy 08/02/2017   Skier's thumb, right, subsequent encounter 03/15/2017   Chronic left shoulder pain 02/08/2017   Encounter for well adult exam with abnormal findings 09/24/2016   Hypothyroidism 09/24/2016    PCP: Lynwood Rush, MD  REFERRING PROVIDER: Jerri Kay HERO, MD Next apt: 8/29  REFERRING DIAG: S03.348 (ICD-10-CM) - Status post right partial knee replacement  THERAPY DIAG:  Acute pain of right knee  Stiffness of right knee, not elsewhere classified  Primary osteoarthritis of  right knee  Difficulty in walking, not elsewhere classified  Rationale for Evaluation and Treatment: Rehabilitation  ONSET DATE: 10/11/23  SUBJECTIVE:   SUBJECTIVE STATEMENT: 3/10 pain today; sees Dr. Jerri in 2 weeks; still having trouble sleeping at night  Evaluation:  Goes by [name]; knee pain for a while; had injections for several years but then stopped helping her.  She saw Dr. Jerri; had surgery 10/11/23; has CPM machine 6 hours a day.    PERTINENT HISTORY: OR nurse PAIN:  Are you having pain? Yes: NPRS scale: 6/10 Pain  location: right knee; and down back of calf Pain description: sore, tight, stabbing and aching Aggravating factors: movement Relieving factors: oxycodone  with acetaminophen , ice man  PRECAUTIONS: None  RED FLAGS: None   WEIGHT BEARING RESTRICTIONS: No  FALLS:  Has patient fallen in last 6 months? No  LIVING ENVIRONMENT: Lives with: lives with their spouse Lives in: House/apartment Stairs: Yes: Internal: 14 steps; on left going up and External: 2 steps; on right going up, on left going up, and bilateral but cannot reach both Has following equipment at home: Vannie - 2 wheeled  OCCUPATION: OR nurse  PLOF: Independent  PATIENT GOALS: go back to work; going to [employer] in 5 weeks; [place]  NEXT MD VISIT: 10/26/23  OBJECTIVE:  Note: Objective measures were completed at Evaluation unless otherwise noted.  DIAGNOSTIC FINDINGS:   PATIENT SURVEYS:  LEFS 13/80 12/14/23: Lower Extremity Functional Score: 30 / 80 = 37.5 % Extreme difficulty/unable (0), Quite a bit of difficulty (1), Moderate difficulty (2), Little difficulty (3), No difficulty (4) Survey date:    Any of your usual work, housework or school activities   2. Usual hobbies, recreational or sporting activities   3. Getting into/out of the bath   4. Walking between rooms   5. Putting on socks/shoes   6. Squatting    7. Lifting an object, like a bag of groceries from the floor   8. Performing light activities around your home   9. Performing heavy activities around your home   10. Getting into/out of a car   11. Walking 2 blocks   12. Walking 1 mile   13. Going up/down 10 stairs (1 flight)   14. Standing for 1 hour   15.  sitting for 1 hour   16. Running on even ground   17. Running on uneven ground   18. Making sharp turns while running fast   19. Hopping    20. Rolling over in bed   Score total:  13/80     COGNITION: Overall cognitive status: Within functional limits for tasks  assessed     SENSATION: Some numbness outside of lower leg  EDEMA:  Normal for this time s/p partial TKA; bandage in place   POSTURE: rounded shoulders and forward head  PALPATION: General soreness and redness  LOWER EXTREMITY ROM:  Active ROM Right eval Left eval Right 10/21/23 Right  10/25/23 Right 11/08/23 Right 11/15/2023 Right 12/14/23  Hip flexion         Hip extension         Hip abduction         Hip adduction         Hip internal rotation         Hip external rotation         Knee flexion 76 AAROM  88 AAROM with rope 92 115 AROM 120  115 118  Knee extension -8  5 lacking from neutral  3 0 0 3  Ankle dorsiflexion         Ankle plantarflexion         Ankle inversion         Ankle eversion          (Blank rows = not tested)  LOWER EXTREMITY MMT:  MMT Right eval Left eval Right 11/15/2023 Left 11/15/2023 Right 12/14/23  Hip flexion   3- 4- 4/5  Hip extension     4/5  Hip abduction     4-  Hip adduction       Hip internal rotation       Hip external rotation       Knee flexion   3+ 4 4+  Knee extension Poor quad set  3+ 5 3+  Ankle dorsiflexion       Ankle plantarflexion       Ankle inversion       Ankle eversion        (Blank rows = not tested)   FUNCTIONAL TESTS:  5 times sit to stand: 41.01 2 minute walk test: 75 ft with RW 12/14/23: : 317ft with no AD  5STS 16.92  GAIT: Distance walked: 75 ft in clinic Assistive device utilized: Walker - 2 wheeled Level of assistance: SBA Comments: antalgic gait                                                                                                                                TREATMENT DATE:  12/24/23 Bike seat 9 x 5' level 3 Hamstring curls 5 plates 3 x 10 Leg press 4 plates 3 x 10 BOSU ball lunges x 10 each 10# kettlebell goblet squats 2 x 10 Slant board 5 x 20    12/21/2023  Therapeutic Exercise: -Stationary Bike, 3 minutes, level 1 resistance, seat 9 -Hamstring curls, pt cued for max  knee flexion, 3 sets of 10 reps, plate 4, plate 5 last two sets, pt cued for equal distribution of resistance -Leg Press, 3 sets of 10 reps, plate 3>plate 4, pt cued for avoidance of knee extension locking, eccentric control, and decreased knee valgus  Therapeutic Activity: -Sled pushes/pulls 50lbs, 3 laps, 50 foot laps, pt cued for symmetrical stride lengths, especially during pulling -Lateral stepping 3 laps 20 steps per lap, with RTB around ankles, pt cued for upright posture   12/16/2023  Therapeutic Exercise: -Supine bridges 2 sets of 10 reps, 5 second holds, second set with hamstring curls, pt cued for max hip extension -Stationary Bike, 5 minutes, level 1 resistance, seat 9 -Step up and overs, 8 inch step, 1 set of 8 reps bilaterally -Lateral step up and overs, 8 inch step, 1 set of 7 reps, bilaterally -Lateral stepping 3 laps 20 feet per lap, second 2 with RTB around ankles, pt cued for upright posture -Knee drive stretch 10 reps, 10 second holds -Prone quad stretch, 2 sets of 30 seconds  12/14/23: Bike full revolution seat 9 x 5' L3 resistance LEFS: 30/80 359 no AD 5STS 16.92 MMT Poor distal quad activation Supine: TKE unable NMR with russian estim x 8 min with verbal and tactile cueing 10/20 24 volts Half bolster unable initially so used full SAQ during [place] but returned to half last minute AROM 3-118 degrees Seated  LAQ 10 Piriformis stretch 3x 30   PATIENT EDUCATION:  Education details: Patient educated on exam findings, POC, scope of PT, HEP, and what to expect in the next few visits. Person educated: Patient Education method: Explanation, Demonstration, and Handouts Education comprehension: verbalized understanding, returned demonstration, verbal cues required, and tactile cues required  HOME EXERCISE PROGRAM: Access Code: [email or web address] URL: [email or web address] Date: 10/14/2023 Prepared by: [facility] - Rehab  Exercises - Supine Ankle Pumps  -  1 x daily - 7 x weekly - 1 sets - 15 reps - Supine Quadricep Sets  - 1 x daily - 7 x weekly - 1 sets - 15 reps - Supine Short Arc Quad  - 1 x daily - 7 x weekly - 1 sets - 15 reps - Supine Heel Slides  - 1 x daily - 7 x weekly - 1 sets - 15 reps - Seated Long Arc Quad  - 1 x daily - 7 x weekly - 1 sets - 15 reps - Seated Knee Flexion Stretch  - 1 x daily - 7 x weekly - 1 sets - 15 reps - Seated Heel Toe Raises  - 1 x daily - 7 x weekly - 1 sets - 15 reps - Supine Heel Slide with Strap  - 1 x daily - 7 x weekly - 1 sets - 10 reps - Long Sitting Calf Stretch with Strap  - 1 x daily - 7 x weekly - 1 sets - 5 reps - 20 sec hold  10/28/23: - Standing Terminal Knee Extension at Wall with Ball  - 2 x daily - 7 x weekly - 1 sets - 10 reps - 5 hold - Heel Toe Raises with Counter Support  - 2 x daily - 7 x weekly - 3 sets - 10 reps  11/15/2023 Access Code: [email or web address] URL: [email or web address] Date: 11/15/2023 Prepared by: Omega Bottcher  Exercises - Sidelying Hip Adduction  - 1 x daily - 7 x weekly - 3 sets - 10 reps - Supine Hip Adduction Isometric with Ball  - 1 x daily - 7 x weekly - 3 sets - 10 reps - 10 hold  12/14/23: - Supine Short Arc Quad  - 2 x daily - 7 x weekly - 3 sets - 10 reps - Seated Long Arc Quad with Hip Adduction  - 1 x daily - 7 x weekly - 3 sets - 10 reps  ASSESSMENT:  CLINICAL IMPRESSION: Patient continues with slight limp but overall demonstrates improving gait pattern and strength. Stayed at higher weights with PRE's today without issue.  Continued work single leg lunges but still needs use of hands for safety.  Added goblet squats with cues needed for form.  Patient would continue to benefit from skilled physical therapy for increased endurance with ambulation, increased RLE strength, and improved balance for improved quality of life, improved independence with gait training and continued progress towards therapy goals.     Evaluation: Patient is a 63 y.o.  female who was seen today for physical therapy evaluation and treatment for Z96.651 (ICD-10-CM) - Status post right partial knee replacement. Patient demonstrates muscle weakness, reduced ROM, and  fascial restrictions which are likely contributing to symptoms of pain and are negatively impacting patient ability to perform ADLs and functional mobility tasks. Patient will benefit from skilled physical therapy services to address these deficits to reduce pain and improve level of function with ADLs and functional mobility tasks.   OBJECTIVE IMPAIRMENTS: Abnormal gait, decreased activity tolerance, decreased endurance, decreased mobility, difficulty walking, decreased ROM, decreased strength, increased fascial restrictions, impaired perceived functional ability, and pain.   ACTIVITY LIMITATIONS: carrying, lifting, bending, sitting, standing, squatting, sleeping, stairs, transfers, bed mobility, bathing, toileting, dressing, locomotion level, and caring for others  PARTICIPATION LIMITATIONS: meal prep, cleaning, laundry, driving, shopping, community activity, and occupation    REHAB POTENTIAL: Good  CLINICAL DECISION MAKING: Evolving/moderate complexity  EVALUATION COMPLEXITY: Moderate   GOALS: Goals reviewed with patient? Yes  SHORT TERM GOALS: Target date: 11/04/2023 patient will be independent with initial HEP  Baseline: 12/14/23:  Reports compliance with HEP daily. Goal status: MET  2.  Patient will report 30% improvement overall  Baseline:  Goal status: MET  3.  Patient will increase right knee mobility to -5 to 90 to promote normal navigation of steps; step over step pattern  Baseline: see above Goal status: MET   LONG TERM GOALS: Target date: 12/06/2023  Patient will be independent in self management strategies to improve quality of life and functional outcomes. Baseline: 12/14/23:   Goal status: MET  2.  Patient will report 50% improvement overall  Baseline: 12/14/23:  Feels  100% improvements, no longer walking with AD, driving. Goal status: IN PROGRESS  3.  Patient will increase right knee mobility to -2 to 120 to promote normal navigation of steps; step over step pattern  Baseline: 12/14/23: 3-118 degrees Goal status: IN PROGRESS  4.  Patient will improve LEFS score by 27 points to demonstrate improved perceived function  Baseline: 13/80; 12/14/23: LEFS 30/80 Goal status: MET  5.  Patient will increase distance on 2 MWT by 100 cft with LRAD to demonstrate improved functional gait in community  Baseline: 75 ft with RW; 12/14/23: 384ft no AD Goal status: MET  6.   Patient will increase right leg MMT's to 5/5 to allow navigation of steps without gait deviation or loss of balance  Baseline: see above Goal status: in progress   PLAN:  PT FREQUENCY: 2x/week  PT DURATION: 6 weeks  PLANNED INTERVENTIONS: 97164- PT Re-evaluation, 97110-Therapeutic exercises, 97530- Therapeutic activity, 97112- Neuromuscular re-education, 97535- Self Care, [id number]- Manual therapy, [id number]- Gait training, [phone]- Orthotic Fit/training, [phone]- Canalith repositioning, [id number]- Aquatic Therapy, 97760- Splinting, 97597- Wound care (first 20 sq cm), 97598- Wound care (each additional 20 sq cm)Patient/Family education, Balance training, Stair training, Taping, Dry Needling, Joint mobilization, Joint manipulation, Spinal manipulation, Spinal mobilization, Scar mobilization, and DME instructions.   PLAN FOR NEXT SESSION: Progress dynamic balance and increase intensity of RLE strengthening. Progress to lunge movements, sls activities; sees MD in 2 weeks  9:24 AM, 12/24/23 Refael Fulop [place] Penning MPT  physical therapy [place] [phone]

## 2023-12-26 ENCOUNTER — Encounter: Payer: Self-pay | Admitting: Orthopaedic Surgery

## 2023-12-27 ENCOUNTER — Other Ambulatory Visit (HOSPITAL_COMMUNITY): Payer: Self-pay

## 2023-12-27 ENCOUNTER — Other Ambulatory Visit: Payer: Self-pay | Admitting: Surgical

## 2023-12-27 DIAGNOSIS — Z96651 Presence of right artificial knee joint: Secondary | ICD-10-CM

## 2023-12-27 MED ORDER — TRAMADOL HCL 50 MG PO TABS
50.0000 mg | ORAL_TABLET | Freq: Two times a day (BID) | ORAL | 2 refills | Status: DC | PRN
  Filled 2023-12-27: qty 30, 15d supply, fill #0

## 2023-12-27 MED ORDER — TRAMADOL HCL 50 MG PO TABS: 50.0000 mg | ORAL_TABLET | Freq: Two times a day (BID) | ORAL | 2 refills | Status: DC | PRN

## 2023-12-27 NOTE — Addendum Note (Signed)
 Addended by: SHIRLY CARLIN CROME on: 12/27/2023 09:16 AM   Modules accepted: Orders

## 2023-12-27 NOTE — Telephone Encounter (Signed)
 refilled

## 2023-12-29 ENCOUNTER — Ambulatory Visit (HOSPITAL_COMMUNITY)

## 2023-12-29 DIAGNOSIS — M25661 Stiffness of right knee, not elsewhere classified: Secondary | ICD-10-CM | POA: Diagnosis not present

## 2023-12-29 DIAGNOSIS — M25561 Pain in right knee: Secondary | ICD-10-CM

## 2023-12-29 DIAGNOSIS — M1711 Unilateral primary osteoarthritis, right knee: Secondary | ICD-10-CM

## 2023-12-29 DIAGNOSIS — R262 Difficulty in walking, not elsewhere classified: Secondary | ICD-10-CM | POA: Diagnosis not present

## 2023-12-29 NOTE — Therapy (Signed)
 OUTPATIENT PHYSICAL THERAPY LOWER EXTREMITY TREATMENT   Patient Name: Alisha Reed MRN: [id number] DOB:1960-08-27, 63 y.o., female Today's Date: 12/29/2023  END OF SESSION:  PT End of Session - 12/29/23 1114     Visit Number 18    Number of Visits 22    Date for PT Re-Evaluation 01/10/24    Authorization Type [employer]    Progress Note Due on Visit 18    PT Start Time 1107    PT Stop Time 1140    PT Time Calculation (min) 33 min    Activity Tolerance Patient tolerated treatment well    Behavior During Therapy [facility] for tasks assessed/performed               Past Medical History:  Diagnosis Date   Allergy seasonal   Arthritis    hands,    Carpal tunnel syndrome on both sides    both hands surgeries 01/2019   GERD (gastroesophageal reflux disease)    Hypercholesterolemia    Hypothyroidism    Osteopenia 11/08/2018   Thyroid  disease    Past Surgical History:  Procedure Laterality Date   ABDOMINAL HYSTERECTOMY     BILATERAL CARPAL TUNNEL RELEASE Bilateral 01/18/2019   Procedure: BILATERAL CARPAL TUNNEL RELEASE;  Surgeon: Jerri Kay HERO, MD;  Location: [facility];  Service: Orthopedics;  Laterality: Bilateral;   BLADDER SUSPENSION N/A 08/10/2022   Procedure: TRANSVAGINAL TAPE (TVT) PROCEDURE;  Surgeon: Marilynne Rosaline SAILOR, MD;  Location: [facility];  Service: Gynecology;  Laterality: N/A;  Total time requested is 1 hour.   CHOLECYSTECTOMY     COLONOSCOPY  2011   CYSTOSCOPY N/A 08/10/2022   Procedure: CYSTOSCOPY;  Surgeon: Marilynne Rosaline SAILOR, MD;  Location: [facility];  Service: Gynecology;  Laterality: N/A;   FOOT SURGERY Bilateral    HYSTERECTOMY ABDOMINAL WITH SALPINGECTOMY     JOINT REPLACEMENT  10/11/2023   NASAL SINUS SURGERY     PARTIAL KNEE ARTHROPLASTY Right 10/11/2023   Procedure: ARTHROPLASTY, KNEE, UNICOMPARTMENTAL;  Surgeon: Jerri Kay HERO, MD;  Location: [facility];  Service:  Orthopedics;  Laterality: Right;  RIGHT KNEE MEDIAL UKA vs TKA   TUBAL LIGATION     Patient Active Problem List   Diagnosis Date Noted   Status post right partial knee replacement 10/11/2023   Primary osteoarthritis of right knee 06/29/2023   Right tennis elbow 06/29/2023   Pain in right elbow 01/26/2023   Coronary artery calcification seen on CT scan 01/09/2023   Allergic rhinitis 12/09/2022   Microhematuria 12/09/2022   Rectal bleeding 03/13/2022   Other constipation 03/13/2022   Arthritis 12/03/2021   Stress incontinence 11/19/2020   S/p bilateral carpal tunnel release 03/28/2019   Chronic pain of right knee 12/29/2018   Right carpal tunnel syndrome 12/29/2018   Left carpal tunnel syndrome 12/29/2018   Osteopenia 11/08/2018   Pre-diabetes 05/02/2018   Acute pain of right shoulder 03/18/2018   Hyperlipidemia 11/04/2017   Vitamin D  deficiency 11/04/2017   S/P cholecystectomy 08/02/2017   Skier's thumb, right, subsequent encounter 03/15/2017   Chronic left shoulder pain 02/08/2017   Encounter for well adult exam with abnormal findings 09/24/2016   Hypothyroidism 09/24/2016    PCP: Lynwood Rush, MD  REFERRING PROVIDER: Jerri Kay HERO, MD Next apt: 8/29  REFERRING DIAG: S03.348 (ICD-10-CM) - Status post right partial knee replacement  THERAPY DIAG:  Acute pain of right knee  Stiffness of right knee, not elsewhere classified  Primary osteoarthritis  of right knee  Difficulty in walking, not elsewhere classified  Rationale for Evaluation and Treatment: Rehabilitation  ONSET DATE: 10/11/23  SUBJECTIVE:   SUBJECTIVE STATEMENT: Pt states knee is talking to her today, 4/10 pain. Pt states knee continues to get tight after prolonged sitting.   Evaluation:  Goes by Alisha Reed; knee pain for a while; had injections for several years but then stopped helping her.  She saw Dr. Jerri; had surgery 10/11/23; has CPM machine 6 hours a day.    PERTINENT HISTORY: OR nurse PAIN:  Are you  having pain? Yes: NPRS scale: 6/10 Pain location: right knee; and down back of calf Pain description: sore, tight, stabbing and aching Aggravating factors: movement Relieving factors: oxycodone  with acetaminophen , ice man  PRECAUTIONS: None  RED FLAGS: None   WEIGHT BEARING RESTRICTIONS: No  FALLS:  Has patient fallen in last 6 months? No  LIVING ENVIRONMENT: Lives with: lives with their spouse Lives in: House/apartment Stairs: Yes: Internal: 14 steps; on left going up and External: 2 steps; on right going up, on left going up, and bilateral but cannot reach both Has following equipment at home: Vannie - 2 wheeled  OCCUPATION: OR nurse  PLOF: Independent  PATIENT GOALS: go back to work; going to [employer] in 5 weeks; [place]  NEXT MD VISIT: 10/26/23  OBJECTIVE:  Note: Objective measures were completed at Evaluation unless otherwise noted.  DIAGNOSTIC FINDINGS:   PATIENT SURVEYS:  LEFS 13/80 12/14/23: Lower Extremity Functional Score: 30 / 80 = 37.5 % Extreme difficulty/unable (0), Quite a bit of difficulty (1), Moderate difficulty (2), Little difficulty (3), No difficulty (4) Survey date:    Any of your usual work, housework or school activities   2. Usual hobbies, recreational or sporting activities   3. Getting into/out of the bath   4. Walking between rooms   5. Putting on socks/shoes   6. Squatting    7. Lifting an object, like a bag of groceries from the floor   8. Performing light activities around your home   9. Performing heavy activities around your home   10. Getting into/out of a car   11. Walking 2 blocks   12. Walking 1 mile   13. Going up/down 10 stairs (1 flight)   14. Standing for 1 hour   15.  sitting for 1 hour   16. Running on even ground   17. Running on uneven ground   18. Making sharp turns while running fast   19. Hopping    20. Rolling over in bed   Score total:  13/80     COGNITION: Overall cognitive status: Within functional  limits for tasks assessed     SENSATION: Some numbness outside of lower leg  EDEMA:  Normal for this time s/p partial TKA; bandage in place   POSTURE: rounded shoulders and forward head  PALPATION: General soreness and redness  LOWER EXTREMITY ROM:  Active ROM Right eval Left eval Right 10/21/23 Right  10/25/23 Right 11/08/23 Right 11/15/2023 Right 12/14/23  Hip flexion         Hip extension         Hip abduction         Hip adduction         Hip internal rotation         Hip external rotation         Knee flexion 76 AAROM  88 AAROM with rope 92 115 AROM 120  115 118  Knee  extension -8  5 lacking from neutral 3 0 0 3  Ankle dorsiflexion         Ankle plantarflexion         Ankle inversion         Ankle eversion          (Blank rows = not tested)  LOWER EXTREMITY MMT:  MMT Right eval Left eval Right 11/15/2023 Left 11/15/2023 Right 12/14/23  Hip flexion   3- 4- 4/5  Hip extension     4/5  Hip abduction     4-  Hip adduction       Hip internal rotation       Hip external rotation       Knee flexion   3+ 4 4+  Knee extension Poor quad set  3+ 5 3+  Ankle dorsiflexion       Ankle plantarflexion       Ankle inversion       Ankle eversion        (Blank rows = not tested)   FUNCTIONAL TESTS:  5 times sit to stand: 41.01 2 minute walk test: 75 ft with RW 12/14/23: : 360ft with no AD  5STS 16.92  GAIT: Distance walked: 75 ft in clinic Assistive device utilized: Walker - 2 wheeled Level of assistance: SBA Comments: antalgic gait                                                                                                                                TREATMENT DATE:  12/29/2023  Therapeutic Exercise: -Stationary Bike, 5 minutes, level 8 resistance, seat 8, pt cued for increased pace -Reverse towel lunge, 1 set of 8 reps, BUE support, pt cued to bend posterior LE -Monster walks with GTB at ankles, 1 lap on 20 foot line, pt cued for exaggeration of  sweeping movement for increased challenge on knee and hip tissues, pt able to perform backwards however decreased step length noted  Therapeutic Activity: -Lateral stepping 1 laps 20 foot line, with GTB around ankles, pt cued for upright posture and avoidance of dragging back LE -12 inch step up, 1 set of 10 reps, with BUE support  12/24/23 Bike seat 9 x 5' level 3 Hamstring curls 5 plates 3 x 10 Leg press 4 plates 3 x 10 BOSU ball lunges x 10 each 10# kettlebell goblet squats 2 x 10 Slant board 5 x 20    12/21/2023  Therapeutic Exercise: -Stationary Bike, 3 minutes, level 1 resistance, seat 9 -Hamstring curls, pt cued for max knee flexion, 3 sets of 10 reps, plate 4, plate 5 last two sets, pt cued for equal distribution of resistance -Leg Press, 3 sets of 10 reps, plate 3>plate 4, pt cued for avoidance of knee extension locking, eccentric control, and decreased knee valgus  Therapeutic Activity: -Sled pushes/pulls 50lbs, 3 laps, 50 foot laps, pt cued for symmetrical stride lengths, especially during pulling -Lateral stepping  3 laps 20 steps per lap, with RTB around ankles, pt cued for upright posture   PATIENT EDUCATION:  Education details: Patient educated on exam findings, POC, scope of PT, HEP, and what to expect in the next few visits. Person educated: Patient Education method: Explanation, Demonstration, and Handouts Education comprehension: verbalized understanding, returned demonstration, verbal cues required, and tactile cues required  HOME EXERCISE PROGRAM: Access Code: [email or web address] URL: [email or web address] Date: 10/14/2023 Prepared by: [facility] - Rehab  Exercises - Supine Ankle Pumps  - 1 x daily - 7 x weekly - 1 sets - 15 reps - Supine Quadricep Sets  - 1 x daily - 7 x weekly - 1 sets - 15 reps - Supine Short Arc Quad  - 1 x daily - 7 x weekly - 1 sets - 15 reps - Supine Heel Slides  - 1 x daily - 7 x weekly - 1 sets - 15 reps - Seated Long Arc Quad  - 1 x  daily - 7 x weekly - 1 sets - 15 reps - Seated Knee Flexion Stretch  - 1 x daily - 7 x weekly - 1 sets - 15 reps - Seated Heel Toe Raises  - 1 x daily - 7 x weekly - 1 sets - 15 reps - Supine Heel Slide with Strap  - 1 x daily - 7 x weekly - 1 sets - 10 reps - Long Sitting Calf Stretch with Strap  - 1 x daily - 7 x weekly - 1 sets - 5 reps - 20 sec hold  10/28/23: - Standing Terminal Knee Extension at Wall with Ball  - 2 x daily - 7 x weekly - 1 sets - 10 reps - 5 hold - Heel Toe Raises with Counter Support  - 2 x daily - 7 x weekly - 3 sets - 10 reps  11/15/2023 Access Code: [email or web address] URL: [email or web address] Date: 11/15/2023 Prepared by: Omega Bottcher  Exercises - Sidelying Hip Adduction  - 1 x daily - 7 x weekly - 3 sets - 10 reps - Supine Hip Adduction Isometric with Ball  - 1 x daily - 7 x weekly - 3 sets - 10 reps - 10 hold  12/14/23: - Supine Short Arc Quad  - 2 x daily - 7 x weekly - 3 sets - 10 reps - Seated Long Arc Quad with Hip Adduction  - 1 x daily - 7 x weekly - 3 sets - 10 reps  ASSESSMENT:  CLINICAL IMPRESSION: Patient continues to demonstrate right knee pain/stiffness, decreased LE strength, decreased gait quality and balance. Patient demonstrates increased endurance with aerobic based exercise during today's session with increased resistance tolerated well on stationary bike. Patient able to continue dynamic balance and RLE activation exercises today with progressed lunges and banded walks, good performance with verbal cueing. Patient would continue to benefit from skilled physical therapy for decreased R knee stiffness/pain, increased LE strength, and improved balance for improved quality of life, improved efficiency  with ADL completion and continued progress towards therapy goals.   Evaluation: Patient is a 63 y.o. female who was seen today for physical therapy evaluation and treatment for Z96.651 (ICD-10-CM) - Status post right partial knee  replacement. Patient demonstrates muscle weakness, reduced ROM, and fascial restrictions which are likely contributing to symptoms of pain and are negatively impacting patient ability to perform ADLs and functional mobility tasks. Patient will benefit from skilled physical therapy services to address these deficits to reduce pain and improve level of function with  ADLs and functional mobility tasks.   OBJECTIVE IMPAIRMENTS: Abnormal gait, decreased activity tolerance, decreased endurance, decreased mobility, difficulty walking, decreased ROM, decreased strength, increased fascial restrictions, impaired perceived functional ability, and pain.   ACTIVITY LIMITATIONS: carrying, lifting, bending, sitting, standing, squatting, sleeping, stairs, transfers, bed mobility, bathing, toileting, dressing, locomotion level, and caring for others  PARTICIPATION LIMITATIONS: meal prep, cleaning, laundry, driving, shopping, community activity, and occupation    REHAB POTENTIAL: Good  CLINICAL DECISION MAKING: Evolving/moderate complexity  EVALUATION COMPLEXITY: Moderate   GOALS: Goals reviewed with patient? Yes  SHORT TERM GOALS: Target date: 11/04/2023 patient will be independent with initial HEP  Baseline: 12/14/23:  Reports compliance with HEP daily. Goal status: MET  2.  Patient will report 30% improvement overall  Baseline:  Goal status: MET  3.  Patient will increase right knee mobility to -5 to 90 to promote normal navigation of steps; step over step pattern  Baseline: see above Goal status: MET   LONG TERM GOALS: Target date: 12/06/2023  Patient will be independent in self management strategies to improve quality of life and functional outcomes. Baseline: 12/14/23:   Goal status: MET  2.  Patient will report 50% improvement overall  Baseline: 12/14/23:  Feels 100% improvements, no longer walking with AD, driving. Goal status: IN PROGRESS  3.  Patient will increase right knee  mobility to -2 to 120 to promote normal navigation of steps; step over step pattern  Baseline: 12/14/23: 3-118 degrees Goal status: IN PROGRESS  4.  Patient will improve LEFS score by 27 points to demonstrate improved perceived function  Baseline: 13/80; 12/14/23: LEFS 30/80 Goal status: MET  5.  Patient will increase distance on 2 MWT by 100 cft with LRAD to demonstrate improved functional gait in community  Baseline: 75 ft with RW; 12/14/23: 357ft no AD Goal status: MET  6.   Patient will increase right leg MMT's to 5/5 to allow navigation of steps without gait deviation or loss of balance  Baseline: see above Goal status: in progress   PLAN:  PT FREQUENCY: 2x/week  PT DURATION: 6 weeks  PLANNED INTERVENTIONS: [id number]- PT Re-evaluation, 97110-Therapeutic exercises, 97530- Therapeutic activity, 97112- Neuromuscular re-education, 97535- Self Care, [id number]- Manual therapy, [id number]- Gait training, [phone]- Orthotic Fit/training, [phone]- Canalith repositioning, [id number]- Aquatic Therapy, 97760- Splinting, 97597- Wound care (first 20 sq cm), 97598- Wound care (each additional 20 sq cm)Patient/Family education, Balance training, Stair training, Taping, Dry Needling, Joint mobilization, Joint manipulation, Spinal manipulation, Spinal mobilization, Scar mobilization, and DME instructions.   PLAN FOR NEXT SESSION: Progress dynamic balance and increase intensity of RLE strengthening. Progress to lunge movements, sls activities; sees MD in 2 weeks, revise HEP prepare for discharge  Lang Ada, PT, DPT [facility] Office: [phone] 6:04 PM, 12/29/23

## 2024-01-04 ENCOUNTER — Encounter (HOSPITAL_COMMUNITY): Payer: Self-pay

## 2024-01-04 ENCOUNTER — Ambulatory Visit (HOSPITAL_COMMUNITY)

## 2024-01-04 DIAGNOSIS — M25661 Stiffness of right knee, not elsewhere classified: Secondary | ICD-10-CM

## 2024-01-04 DIAGNOSIS — R262 Difficulty in walking, not elsewhere classified: Secondary | ICD-10-CM | POA: Diagnosis not present

## 2024-01-04 DIAGNOSIS — M1711 Unilateral primary osteoarthritis, right knee: Secondary | ICD-10-CM

## 2024-01-04 DIAGNOSIS — M25561 Pain in right knee: Secondary | ICD-10-CM

## 2024-01-04 NOTE — Therapy (Signed)
 OUTPATIENT PHYSICAL THERAPY LOWER EXTREMITY TREATMENT   Patient Name: Alisha Reed MRN: [id number] DOB:July 25, 1960, 63 y.o., female Today's Date: 01/04/2024  END OF SESSION:  PT End of Session - 01/04/24 1058     Visit Number 19    Number of Visits 22    Date for PT Re-Evaluation 01/10/24    Authorization Type [employer]    Progress Note Due on Visit 24   PN complete visit #14   PT Start Time 1101    PT Stop Time 1148    PT Time Calculation (min) 47 min    Activity Tolerance Patient tolerated treatment well    Behavior During Therapy WFL for tasks assessed/performed               Past Medical History:  Diagnosis Date   Allergy seasonal   Arthritis    hands,    Carpal tunnel syndrome on both sides    both hands surgeries 01/2019   GERD (gastroesophageal reflux disease)    Hypercholesterolemia    Hypothyroidism    Osteopenia 11/08/2018   Thyroid  disease    Past Surgical History:  Procedure Laterality Date   ABDOMINAL HYSTERECTOMY     BILATERAL CARPAL TUNNEL RELEASE Bilateral 01/18/2019   Procedure: BILATERAL CARPAL TUNNEL RELEASE;  Surgeon: Alisha Reed;  Location: [facility];  Service: Orthopedics;  Laterality: Bilateral;   BLADDER SUSPENSION N/A 08/10/2022   Procedure: TRANSVAGINAL TAPE (TVT) PROCEDURE;  Surgeon: Alisha Reed;  Location: [facility];  Service: Gynecology;  Laterality: N/A;  Total time requested is 1 hour.   CHOLECYSTECTOMY     COLONOSCOPY  2011   CYSTOSCOPY N/A 08/10/2022   Procedure: CYSTOSCOPY;  Surgeon: Alisha Reed;  Location: [facility];  Service: Gynecology;  Laterality: N/A;   FOOT SURGERY Bilateral    HYSTERECTOMY ABDOMINAL WITH SALPINGECTOMY     JOINT REPLACEMENT  10/11/2023   NASAL SINUS SURGERY     PARTIAL KNEE ARTHROPLASTY Right 10/11/2023   Procedure: ARTHROPLASTY, KNEE, UNICOMPARTMENTAL;  Surgeon: Alisha Reed;  Location: [facility];  Service: Orthopedics;  Laterality: Right;  RIGHT KNEE MEDIAL UKA vs TKA   TUBAL LIGATION     Patient Active Problem List   Diagnosis Date Noted   Status post right partial knee replacement 10/11/2023   Primary osteoarthritis of right knee 06/29/2023   Right tennis elbow 06/29/2023   Pain in right elbow 01/26/2023   Coronary artery calcification seen on CT scan 01/09/2023   Allergic rhinitis 12/09/2022   Microhematuria 12/09/2022   Rectal bleeding 03/13/2022   Other constipation 03/13/2022   Arthritis 12/03/2021   Stress incontinence 11/19/2020   S/p bilateral carpal tunnel release 03/28/2019   Chronic pain of right knee 12/29/2018   Right carpal tunnel syndrome 12/29/2018   Left carpal tunnel syndrome 12/29/2018   Osteopenia 11/08/2018   Pre-diabetes 05/02/2018   Acute pain of right shoulder 03/18/2018   Hyperlipidemia 11/04/2017   Vitamin D  deficiency 11/04/2017   S/P cholecystectomy 08/02/2017   Skier's thumb, right, subsequent encounter 03/15/2017   Chronic left shoulder pain 02/08/2017   Encounter for well adult exam with abnormal findings 09/24/2016   Hypothyroidism 09/24/2016    PCP: Alisha Reed  REFERRING PROVIDER: Jerri Kay HERO, Reed Next apt: 8/29  REFERRING DIAG: S03.348 (ICD-10-CM) - Status post right partial knee replacement  THERAPY DIAG:  Acute pain of right knee  Stiffness of right knee, not  elsewhere classified  Primary osteoarthritis of right knee  Difficulty in walking, not elsewhere classified  Rationale for Evaluation and Treatment: Rehabilitation  ONSET DATE: 10/11/23  SUBJECTIVE:   SUBJECTIVE STATEMENT: Pain scale 3/10, Rt knee.  Plans to return to work next week and has Reed apt this Friday.  Reports she slept 8 hours without pain medication, first time since surgery.  Evaluation:  Goes by [name]; knee pain for a while; had injections for several years but then stopped helping her.  She saw Dr. Jerri; had surgery 10/11/23; has CPM machine  6 hours a day.    PERTINENT HISTORY: OR nurse PAIN:  Are you having pain? Yes: NPRS scale: 6/10 Pain location: right knee; and down back of calf Pain description: sore, tight, stabbing and aching Aggravating factors: movement Relieving factors: oxycodone  with acetaminophen , ice man  PRECAUTIONS: None  RED FLAGS: None   WEIGHT BEARING RESTRICTIONS: No  FALLS:  Has patient fallen in last 6 months? No  LIVING ENVIRONMENT: Lives with: lives with their spouse Lives in: House/apartment Stairs: Yes: Internal: 14 steps; on left going up and External: 2 steps; on right going up, on left going up, and bilateral but cannot reach both Has following equipment at home: Vannie - 2 wheeled  OCCUPATION: OR nurse  PLOF: Independent  PATIENT GOALS: go back to work; going to [employer] in 5 weeks; [place]  NEXT Reed VISIT: 10/26/23  OBJECTIVE:  Note: Objective measures were completed at Evaluation unless otherwise noted.  DIAGNOSTIC FINDINGS:   PATIENT SURVEYS:  LEFS 13/80 12/14/23: Lower Extremity Functional Score: 30 / 80 = 37.5 % Extreme difficulty/unable (0), Quite a bit of difficulty (1), Moderate difficulty (2), Little difficulty (3), No difficulty (4) Survey date:    Any of your usual work, housework or school activities   2. Usual hobbies, recreational or sporting activities   3. Getting into/out of the bath   4. Walking between rooms   5. Putting on socks/shoes   6. Squatting    7. Lifting an object, like a bag of groceries from the floor   8. Performing light activities around your home   9. Performing heavy activities around your home   10. Getting into/out of a car   11. Walking 2 blocks   12. Walking 1 mile   13. Going up/down 10 stairs (1 flight)   14. Standing for 1 hour   15.  sitting for 1 hour   16. Running on even ground   17. Running on uneven ground   18. Making sharp turns while running fast   19. Hopping    20. Rolling over in bed   Score total:  13/80      COGNITION: Overall cognitive status: Within functional limits for tasks assessed     SENSATION: Some numbness outside of lower leg  EDEMA:  Normal for this time s/p partial TKA; bandage in place   POSTURE: rounded shoulders and forward head  PALPATION: General soreness and redness  LOWER EXTREMITY ROM:  Active ROM Right eval Left eval Right 10/21/23 Right  10/25/23 Right 11/08/23 Right 11/15/2023 Right 12/14/23  Hip flexion         Hip extension         Hip abduction         Hip adduction         Hip internal rotation         Hip external rotation         Knee flexion  76 AAROM  88 AAROM with rope 92 115 AROM 120  115 118  Knee extension -8  5 lacking from neutral 3 0 0 3  Ankle dorsiflexion         Ankle plantarflexion         Ankle inversion         Ankle eversion          (Blank rows = not tested)  LOWER EXTREMITY MMT:  MMT Right eval Left eval Right 11/15/2023 Left 11/15/2023 Right 12/14/23  Hip flexion   3- 4- 4/5  Hip extension     4/5  Hip abduction     4-  Hip adduction       Hip internal rotation       Hip external rotation       Knee flexion   3+ 4 4+  Knee extension Poor quad set  3+ 5 3+  Ankle dorsiflexion       Ankle plantarflexion       Ankle inversion       Ankle eversion        (Blank rows = not tested)   FUNCTIONAL TESTS:  5 times sit to stand: 41.01 2 minute walk test: 75 ft with RW 12/14/23: : 338ft with no AD  5STS 16.92  GAIT: Distance walked: 75 ft in clinic Assistive device utilized: Walker - 2 wheeled Level of assistance: SBA Comments: antalgic gait                                                                                                                                TREATMENT DATE:  01/04/24: -Stationary Bike, 5 minutes, level 1 resistance, seat 9 Standing: - Bodycraft TKE 2Pl 20x - Bodycraft retro and side gait 2Pl 5RT - SLS Rt 10, Lt 34 - Vector stance 3x 5 BLE with 1 HHA  Supine: AROM 0-118  degrees  12/29/2023  Therapeutic Exercise: -Stationary Bike, 5 minutes, level 8 resistance, seat 8, pt cued for increased pace -Reverse towel lunge, 1 set of 8 reps, BUE support, pt cued to bend posterior LE -Monster walks with GTB at ankles, 1 lap on 20 foot line, pt cued for exaggeration of sweeping movement for increased challenge on knee and hip tissues, pt able to perform backwards however decreased step length noted  Therapeutic Activity: -Lateral stepping 1 laps 20 foot line, with GTB around ankles, pt cued for upright posture and avoidance of dragging back LE -12 inch step up, 1 set of 10 reps, with BUE support  12/24/23 Bike seat 9 x 5' level 3 Hamstring curls 5 plates 3 x 10 Leg press 4 plates 3 x 10 BOSU ball lunges x 10 each 10# kettlebell goblet squats 2 x 10 Slant board 5 x 20    12/21/2023  Therapeutic Exercise: -Stationary Bike, 3 minutes, level 1 resistance, seat 9 -Hamstring curls, pt cued for max knee flexion, 3 sets of 10  reps, plate 4, plate 5 last two sets, pt cued for equal distribution of resistance -Leg Press, 3 sets of 10 reps, plate 3>plate 4, pt cued for avoidance of knee extension locking, eccentric control, and decreased knee valgus  Therapeutic Activity: -Sled pushes/pulls 50lbs, 3 laps, 50 foot laps, pt cued for symmetrical stride lengths, especially during pulling -Lateral stepping 3 laps 20 steps per lap, with RTB around ankles, pt cued for upright posture   PATIENT EDUCATION:  Education details: Patient educated on exam findings, POC, scope of PT, HEP, and what to expect in the next few visits. Person educated: Patient Education method: Explanation, Demonstration, and Handouts Education comprehension: verbalized understanding, returned demonstration, verbal cues required, and tactile cues required  HOME EXERCISE PROGRAM: Access Code: [email or web address] URL: [email or web address] Date: 10/14/2023 Prepared by: [facility] - Rehab  Exercises -  Supine Ankle Pumps  - 1 x daily - 7 x weekly - 1 sets - 15 reps - Supine Quadricep Sets  - 1 x daily - 7 x weekly - 1 sets - 15 reps - Supine Short Arc Quad  - 1 x daily - 7 x weekly - 1 sets - 15 reps - Supine Heel Slides  - 1 x daily - 7 x weekly - 1 sets - 15 reps - Seated Long Arc Quad  - 1 x daily - 7 x weekly - 1 sets - 15 reps - Seated Knee Flexion Stretch  - 1 x daily - 7 x weekly - 1 sets - 15 reps - Seated Heel Toe Raises  - 1 x daily - 7 x weekly - 1 sets - 15 reps - Supine Heel Slide with Strap  - 1 x daily - 7 x weekly - 1 sets - 10 reps - Long Sitting Calf Stretch with Strap  - 1 x daily - 7 x weekly - 1 sets - 5 reps - 20 sec hold  10/28/23: - Standing Terminal Knee Extension at Wall with Ball  - 2 x daily - 7 x weekly - 1 sets - 10 reps - 5 hold - Heel Toe Raises with Counter Support  - 2 x daily - 7 x weekly - 3 sets - 10 reps  11/15/2023 Access Code: [email or web address] URL: [email or web address] Date: 11/15/2023 Prepared by: Omega Bottcher  Exercises - Sidelying Hip Adduction  - 1 x daily - 7 x weekly - 3 sets - 10 reps - Supine Hip Adduction Isometric with Ball  - 1 x daily - 7 x weekly - 3 sets - 10 reps - 10 hold  12/14/23: - Supine Short Arc Quad  - 2 x daily - 7 x weekly - 3 sets - 10 reps - Seated Long Arc Quad with Hip Adduction  - 1 x daily - 7 x weekly - 3 sets - 10 reps  ASSESSMENT:  CLINICAL IMPRESSION: Noted antaglic gait due to knee extension lag with gait.  Added body craft TKE and hip stability exercises that was tolerated well, SBA for safety with new exercise.  Presents with improved gait mechanics following cueing for knee extension.  AROM at 0-118 degrees today.  Added balance training to HEP, encouraged to stand near counter or sink for safety.  Pt was limited by pain through session, monitored through session.  Pt with Reed on Friday, plan to reassess next session and reports RTW next week if Reed releases.     Evaluation: Patient is a 62 y.o.  female who was seen today for physical therapy evaluation and treatment  for Z96.651 (ICD-10-CM) - Status post right partial knee replacement. Patient demonstrates muscle weakness, reduced ROM, and fascial restrictions which are likely contributing to symptoms of pain and are negatively impacting patient ability to perform ADLs and functional mobility tasks. Patient will benefit from skilled physical therapy services to address these deficits to reduce pain and improve level of function with ADLs and functional mobility tasks.   OBJECTIVE IMPAIRMENTS: Abnormal gait, decreased activity tolerance, decreased endurance, decreased mobility, difficulty walking, decreased ROM, decreased strength, increased fascial restrictions, impaired perceived functional ability, and pain.   ACTIVITY LIMITATIONS: carrying, lifting, bending, sitting, standing, squatting, sleeping, stairs, transfers, bed mobility, bathing, toileting, dressing, locomotion level, and caring for others  PARTICIPATION LIMITATIONS: meal prep, cleaning, laundry, driving, shopping, community activity, and occupation    REHAB POTENTIAL: Good  CLINICAL DECISION MAKING: Evolving/moderate complexity  EVALUATION COMPLEXITY: Moderate   GOALS: Goals reviewed with patient? Yes  SHORT TERM GOALS: Target date: 11/04/2023 patient will be independent with initial HEP  Baseline: 12/14/23:  Reports compliance with HEP daily. Goal status: MET  2.  Patient will report 30% improvement overall  Baseline:  Goal status: MET  3.  Patient will increase right knee mobility to -5 to 90 to promote normal navigation of steps; step over step pattern  Baseline: see above Goal status: MET   LONG TERM GOALS: Target date: 12/06/2023  Patient will be independent in self management strategies to improve quality of life and functional outcomes. Baseline: 12/14/23:   Goal status: MET  2.  Patient will report 50% improvement overall  Baseline: 12/14/23:  Feels  100% improvements, no longer walking with AD, driving. Goal status: IN PROGRESS  3.  Patient will increase right knee mobility to -2 to 120 to promote normal navigation of steps; step over step pattern  Baseline: 12/14/23: 3-118 degrees Goal status: IN PROGRESS  4.  Patient will improve LEFS score by 27 points to demonstrate improved perceived function  Baseline: 13/80; 12/14/23: LEFS 30/80 Goal status: MET  5.  Patient will increase distance on 2 MWT by 100 cft with LRAD to demonstrate improved functional gait in community  Baseline: 75 ft with RW; 12/14/23: 375ft no AD Goal status: MET  6.   Patient will increase right leg MMT's to 5/5 to allow navigation of steps without gait deviation or loss of balance  Baseline: see above Goal status: in progress   PLAN:  PT FREQUENCY: 2x/week  PT DURATION: 6 weeks  PLANNED INTERVENTIONS: [id number]- PT Re-evaluation, 97110-Therapeutic exercises, 97530- Therapeutic activity, 97112- Neuromuscular re-education, 97535- Self Care, [id number]- Manual therapy, [id number]- Gait training, [phone]- Orthotic Fit/training, [phone]- Canalith repositioning, [id number]- Aquatic Therapy, 97760- Splinting, 97597- Wound care (first 20 sq cm), 97598- Wound care (each additional 20 sq cm)Patient/Family education, Balance training, Stair training, Taping, Dry Needling, Joint mobilization, Joint manipulation, Spinal manipulation, Spinal mobilization, Scar mobilization, and DME instructions.   PLAN FOR NEXT SESSION: Progress dynamic balance and increase intensity of RLE strengthening. Progress to lunge movements, sls activities; sees Reed in 2 weeks, revise HEP prepare for discharge.  Reassess next session.  Augustin Mclean, LPTA/CLT; WILLAIM [phone]  2:59 PM, 01/04/24

## 2024-01-05 ENCOUNTER — Encounter (HOSPITAL_COMMUNITY)

## 2024-01-07 ENCOUNTER — Other Ambulatory Visit (HOSPITAL_COMMUNITY): Payer: Self-pay

## 2024-01-07 ENCOUNTER — Telehealth: Payer: Self-pay | Admitting: Orthopaedic Surgery

## 2024-01-07 ENCOUNTER — Ambulatory Visit: Admitting: Physician Assistant

## 2024-01-07 ENCOUNTER — Ambulatory Visit (HOSPITAL_COMMUNITY)

## 2024-01-07 DIAGNOSIS — M25561 Pain in right knee: Secondary | ICD-10-CM | POA: Diagnosis not present

## 2024-01-07 DIAGNOSIS — R262 Difficulty in walking, not elsewhere classified: Secondary | ICD-10-CM

## 2024-01-07 DIAGNOSIS — M1711 Unilateral primary osteoarthritis, right knee: Secondary | ICD-10-CM

## 2024-01-07 DIAGNOSIS — M25661 Stiffness of right knee, not elsewhere classified: Secondary | ICD-10-CM | POA: Diagnosis not present

## 2024-01-07 DIAGNOSIS — Z96651 Presence of right artificial knee joint: Secondary | ICD-10-CM

## 2024-01-07 MED ORDER — HYDROCODONE-ACETAMINOPHEN 5-325 MG PO TABS: 1.0000 | ORAL_TABLET | Freq: Every evening | ORAL | 0 refills | Status: AC | PRN

## 2024-01-07 MED ORDER — TRAMADOL HCL 50 MG PO TABS
50.0000 mg | ORAL_TABLET | Freq: Two times a day (BID) | ORAL | 2 refills | Status: DC | PRN
Start: 2024-01-07 — End: 2024-02-24

## 2024-01-07 NOTE — Telephone Encounter (Signed)
 Completed and faxed.

## 2024-01-07 NOTE — Telephone Encounter (Signed)
 Pt came in with an accomodation form for Continuous Leave of Absence for work. (Per Crystal there is N/C for this form due to it being an extension of previous form).  Placed the form in the tray for Tammy.

## 2024-01-07 NOTE — Therapy (Signed)
 OUTPATIENT PHYSICAL THERAPY LOWER EXTREMITY TREATMENT/DISCHARGE PHYSICAL THERAPY DISCHARGE SUMMARY  Visits from Start of Care: 20  Current functional level related to goals / functional outcomes: See below   Remaining deficits: See below   Education / Equipment: HEP   Patient agrees to discharge. Patient goals were met. Patient is being discharged due to meeting the stated rehab goals.    Patient Name: Alisha Reed MRN: [id number] DOB:February 19, 1961, 63 y.o., female Today's Date: 01/07/2024  END OF SESSION:  PT End of Session - 01/07/24 1015     Visit Number 20    Number of Visits 22    Date for PT Re-Evaluation 01/10/24    Authorization Type [employer]    Progress Note Due on Visit 24   PN complete visit #14   PT Start Time 1015    PT Stop Time 1055    PT Time Calculation (min) 40 min    Activity Tolerance Patient tolerated treatment well    Behavior During Therapy [facility] for tasks assessed/performed               Past Medical History:  Diagnosis Date   Allergy seasonal   Arthritis    hands,    Carpal tunnel syndrome on both sides    both hands surgeries 01/2019   GERD (gastroesophageal reflux disease)    Hypercholesterolemia    Hypothyroidism    Osteopenia 11/08/2018   Thyroid  disease    Past Surgical History:  Procedure Laterality Date   ABDOMINAL HYSTERECTOMY     BILATERAL CARPAL TUNNEL RELEASE Bilateral 01/18/2019   Procedure: BILATERAL CARPAL TUNNEL RELEASE;  Surgeon: Jerri Kay HERO, MD;  Location: [facility];  Service: Orthopedics;  Laterality: Bilateral;   BLADDER SUSPENSION N/A 08/10/2022   Procedure: TRANSVAGINAL TAPE (TVT) PROCEDURE;  Surgeon: Marilynne Rosaline SAILOR, MD;  Location: [facility];  Service: Gynecology;  Laterality: N/A;  Total time requested is 1 hour.   CHOLECYSTECTOMY     COLONOSCOPY  2011   CYSTOSCOPY N/A 08/10/2022   Procedure: CYSTOSCOPY;  Surgeon: Marilynne Rosaline SAILOR, MD;   Location: [facility];  Service: Gynecology;  Laterality: N/A;   FOOT SURGERY Bilateral    HYSTERECTOMY ABDOMINAL WITH SALPINGECTOMY     JOINT REPLACEMENT  10/11/2023   NASAL SINUS SURGERY     PARTIAL KNEE ARTHROPLASTY Right 10/11/2023   Procedure: ARTHROPLASTY, KNEE, UNICOMPARTMENTAL;  Surgeon: Jerri Kay HERO, MD;  Location: [facility];  Service: Orthopedics;  Laterality: Right;  RIGHT KNEE MEDIAL UKA vs TKA   TUBAL LIGATION     Patient Active Problem List   Diagnosis Date Noted   Status post right partial knee replacement 10/11/2023   Primary osteoarthritis of right knee 06/29/2023   Right tennis elbow 06/29/2023   Pain in right elbow 01/26/2023   Coronary artery calcification seen on CT scan 01/09/2023   Allergic rhinitis 12/09/2022   Microhematuria 12/09/2022   Rectal bleeding 03/13/2022   Other constipation 03/13/2022   Arthritis 12/03/2021   Stress incontinence 11/19/2020   S/p bilateral carpal tunnel release 03/28/2019   Chronic pain of right knee 12/29/2018   Right carpal tunnel syndrome 12/29/2018   Left carpal tunnel syndrome 12/29/2018   Osteopenia 11/08/2018   Pre-diabetes 05/02/2018   Acute pain of right shoulder 03/18/2018   Hyperlipidemia 11/04/2017   Vitamin D  deficiency 11/04/2017   S/P cholecystectomy 08/02/2017   Skier's thumb, right, subsequent encounter 03/15/2017   Chronic left shoulder pain 02/08/2017  Encounter for well adult exam with abnormal findings 09/24/2016   Hypothyroidism 09/24/2016    PCP: Lynwood Rush, MD  REFERRING PROVIDER: Jerri Kay HERO, MD Next apt: 8/29  REFERRING DIAG: S03.348 (ICD-10-CM) - Status post right partial knee replacement  THERAPY DIAG:  Acute pain of right knee  Stiffness of right knee, not elsewhere classified  Primary osteoarthritis of right knee  Difficulty in walking, not elsewhere classified  Rationale for Evaluation and Treatment: Rehabilitation  ONSET DATE: 10/11/23  SUBJECTIVE:   SUBJECTIVE  STATEMENT: Patient did not sleep well since her last visit; only slept 2 hours last night. Her right hip is bothering her; having some right sciaticia as well.  Sees her MD today.  Overall 90% better although cannot ride her bike out on the road yet.     Evaluation:  Goes by [name]; knee pain for a while; had injections for several years but then stopped helping her.  She saw Dr. Jerri; had surgery 10/11/23; has CPM machine 6 hours a day.    PERTINENT HISTORY: OR nurse PAIN:  Are you having pain? Yes: NPRS scale: 6/10 Pain location: right knee; and down back of calf Pain description: sore, tight, stabbing and aching Aggravating factors: movement Relieving factors: oxycodone  with acetaminophen , ice man  PRECAUTIONS: None  RED FLAGS: None   WEIGHT BEARING RESTRICTIONS: No  FALLS:  Has patient fallen in last 6 months? No  LIVING ENVIRONMENT: Lives with: lives with their spouse Lives in: House/apartment Stairs: Yes: Internal: 14 steps; on left going up and External: 2 steps; on right going up, on left going up, and bilateral but cannot reach both Has following equipment at home: Vannie - 2 wheeled  OCCUPATION: OR nurse  PLOF: Independent  PATIENT GOALS: go back to work; going to [employer] in 5 weeks[place]  NEXT MD VISIT: 10/26/23  OBJECTIVE:  Note: Objective measures were completed at Evaluation unless otherwise noted.  DIAGNOSTIC FINDINGS:   PATIENT SURVEYS:  LEFS 13/80 12/14/23: Lower Extremity Functional Score: 30 / 80 = 37.5 % Extreme difficulty/unable (0), Quite a bit of difficulty (1), Moderate difficulty (2), Little difficulty (3), No difficulty (4) Survey date:    Any of your usual work, housework or school activities   2. Usual hobbies, recreational or sporting activities   3. Getting into/out of the bath   4. Walking between rooms   5. Putting on socks/shoes   6. Squatting    7. Lifting an object, like a bag of groceries from the floor   8. Performing light  activities around your home   9. Performing heavy activities around your home   10. Getting into/out of a car   11. Walking 2 blocks   12. Walking 1 mile   13. Going up/down 10 stairs (1 flight)   14. Standing for 1 hour   15.  sitting for 1 hour   16. Running on even ground   17. Running on uneven ground   18. Making sharp turns while running fast   19. Hopping    20. Rolling over in bed   Score total:  13/80     COGNITION: Overall cognitive status: Within functional limits for tasks assessed     SENSATION: Some numbness outside of lower leg  EDEMA:  Normal for this time s/p partial TKA; bandage in place   POSTURE: rounded shoulders and forward head  PALPATION: General soreness and redness  LOWER EXTREMITY ROM:  Active ROM Right eval Left eval Right 10/21/23  Right  10/25/23 Right 11/08/23 Right 11/15/2023 Right 12/14/23 Right 01/07/24  Hip flexion          Hip extension          Hip abduction          Hip adduction          Hip internal rotation          Hip external rotation          Knee flexion 76 AAROM  88 AAROM with rope 92 115 AROM 120  115 118 120  Knee extension -8  5 lacking from neutral 3 0 0 3 0  Ankle dorsiflexion          Ankle plantarflexion          Ankle inversion          Ankle eversion           (Blank rows = not tested)  LOWER EXTREMITY MMT:  MMT Right eval Left eval Right 11/15/2023 Left 11/15/2023 Right 12/14/23 Right 01/07/24  Hip flexion   3- 4- 4/5 5  Hip extension     4/5   Hip abduction     4-   Hip adduction        Hip internal rotation        Hip external rotation        Knee flexion   3+ 4 4+ 5  Knee extension Poor quad set  3+ 5 3+ 5*  Ankle dorsiflexion        Ankle plantarflexion        Ankle inversion        Ankle eversion         (Blank rows = not tested)   FUNCTIONAL TESTS:  5 times sit to stand: 41.01 2 minute walk test: 75 ft with RW 12/14/23: : 330ft with no AD  5STS 16.92  GAIT: Distance walked: 75 ft  in clinic Assistive device utilized: Walker - 2 wheeled Level of assistance: SBA Comments: antalgic gait                                                                                                                                TREATMENT DATE:  01/06/24 Bike seat 9 x 5' level 5 Heel raises 2 x 10 Slant board 5 x 20 Knee drives for flexion right knee on 14 step x 10 AROM and MMT's see above Piriformis stretch supine knee to opposite chest     01/04/24: -Stationary Bike, 5 minutes, level 1 resistance, seat 9 Standing: - Bodycraft TKE 2Pl 20x - Bodycraft retro and side gait 2Pl 5RT - SLS Rt 10, Lt 34 - Vector stance 3x 5 BLE with 1 HHA  Supine: AROM 0-118 degrees  12/29/2023  Therapeutic Exercise: -Stationary Bike, 5 minutes, level 8 resistance, seat 8, pt cued for increased pace -Reverse towel lunge, 1 set of 8 reps, BUE support, pt cued  to bend posterior LE -Monster walks with GTB at ankles, 1 lap on 20 foot line, pt cued for exaggeration of sweeping movement for increased challenge on knee and hip tissues, pt able to perform backwards however decreased step length noted  Therapeutic Activity: -Lateral stepping 1 laps 20 foot line, with GTB around ankles, pt cued for upright posture and avoidance of dragging back LE -12 inch step up, 1 set of 10 reps, with BUE support  12/24/23 Bike seat 9 x 5' level 3 Hamstring curls 5 plates 3 x 10 Leg press 4 plates 3 x 10 BOSU ball lunges x 10 each 10# kettlebell goblet squats 2 x 10 Slant board 5 x 20    12/21/2023  Therapeutic Exercise: -Stationary Bike, 3 minutes, level 1 resistance, seat 9 -Hamstring curls, pt cued for max knee flexion, 3 sets of 10 reps, plate 4, plate 5 last two sets, pt cued for equal distribution of resistance -Leg Press, 3 sets of 10 reps, plate 3>plate 4, pt cued for avoidance of knee extension locking, eccentric control, and decreased knee valgus  Therapeutic Activity: -Sled pushes/pulls  50lbs, 3 laps, 50 foot laps, pt cued for symmetrical stride lengths, especially during pulling -Lateral stepping 3 laps 20 steps per lap, with RTB around ankles, pt cued for upright posture   PATIENT EDUCATION:  Education details: Patient educated on exam findings, POC, scope of PT, HEP, and what to expect in the next few visits. Person educated: Patient Education method: Explanation, Demonstration, and Handouts Education comprehension: verbalized understanding, returned demonstration, verbal cues required, and tactile cues required  HOME EXERCISE PROGRAM: Access Code: [email or web address] URL: [email or web address] Date: 10/14/2023 Prepared by: [facility] - Rehab  Exercises - Supine Ankle Pumps  - 1 x daily - 7 x weekly - 1 sets - 15 reps - Supine Quadricep Sets  - 1 x daily - 7 x weekly - 1 sets - 15 reps - Supine Short Arc Quad  - 1 x daily - 7 x weekly - 1 sets - 15 reps - Supine Heel Slides  - 1 x daily - 7 x weekly - 1 sets - 15 reps - Seated Long Arc Quad  - 1 x daily - 7 x weekly - 1 sets - 15 reps - Seated Knee Flexion Stretch  - 1 x daily - 7 x weekly - 1 sets - 15 reps - Seated Heel Toe Raises  - 1 x daily - 7 x weekly - 1 sets - 15 reps - Supine Heel Slide with Strap  - 1 x daily - 7 x weekly - 1 sets - 10 reps - Long Sitting Calf Stretch with Strap  - 1 x daily - 7 x weekly - 1 sets - 5 reps - 20 sec hold  10/28/23: - Standing Terminal Knee Extension at Wall with Ball  - 2 x daily - 7 x weekly - 1 sets - 10 reps - 5 hold - Heel Toe Raises with Counter Support  - 2 x daily - 7 x weekly - 3 sets - 10 reps  11/15/2023 Access Code: [email or web address] URL: [email or web address] Date: 11/15/2023 Prepared by: [employer] Bottcher  Exercises - Sidelying Hip Adduction  - 1 x daily - 7 x weekly - 3 sets - 10 reps - Supine Hip Adduction Isometric with Ball  - 1 x daily - 7 x weekly - 3 sets - 10 reps - 10 hold  12/14/23: - Supine Short Arc Quad  - 2 x daily - 7 x weekly -  3 sets - 10  reps - Seated Long Arc Quad with Hip Adduction  - 1 x daily - 7 x weekly - 3 sets - 10 reps  ASSESSMENT:  CLINICAL IMPRESSION: Progress note today.  Of note, patient with tenderness posterior knee joint line perhaps a Bakers cyst and tender right glute and piriformis.  Added a piriformis stretch today.  Patient has met all set rehab goals and is agreeable to discharge at this time.     Evaluation: Patient is a 63 y.o. female who was seen today for physical therapy evaluation and treatment for Z96.651 (ICD-10-CM) - Status post right partial knee replacement. Patient demonstrates muscle weakness, reduced ROM, and fascial restrictions which are likely contributing to symptoms of pain and are negatively impacting patient ability to perform ADLs and functional mobility tasks. Patient will benefit from skilled physical therapy services to address these deficits to reduce pain and improve level of function with ADLs and functional mobility tasks.   OBJECTIVE IMPAIRMENTS: Abnormal gait, decreased activity tolerance, decreased endurance, decreased mobility, difficulty walking, decreased ROM, decreased strength, increased fascial restrictions, impaired perceived functional ability, and pain.   ACTIVITY LIMITATIONS: carrying, lifting, bending, sitting, standing, squatting, sleeping, stairs, transfers, bed mobility, bathing, toileting, dressing, locomotion level, and caring for others  PARTICIPATION LIMITATIONS: meal prep, cleaning, laundry, driving, shopping, community activity, and occupation    REHAB POTENTIAL: Good  CLINICAL DECISION MAKING: Evolving/moderate complexity  EVALUATION COMPLEXITY: Moderate   GOALS: Goals reviewed with patient? Yes  SHORT TERM GOALS: Target date: 11/04/2023 patient will be independent with initial HEP  Baseline: 12/14/23:  Reports compliance with HEP daily. Goal status: MET  2.  Patient will report 30% improvement overall  Baseline:  Goal status: MET  3.   Patient will increase right knee mobility to -5 to 90 to promote normal navigation of steps; step over step pattern  Baseline: see above Goal status: MET   LONG TERM GOALS: Target date: 12/06/2023  Patient will be independent in self management strategies to improve quality of life and functional outcomes. Baseline: 12/14/23:   Goal status: MET  2.  Patient will report 50% improvement overall  Baseline: 12/14/23:  Feels 100% improvements, no longer walking with AD, driving. Goal status: met  3.  Patient will increase right knee mobility to -2 to 120 to promote normal navigation of steps; step over step pattern  Baseline: 12/14/23: 3-118 degrees Goal status: met  4.  Patient will improve LEFS score by 27 points to demonstrate improved perceived function  Baseline: 13/80; 12/14/23: LEFS 30/80 Goal status: MET  5.  Patient will increase distance on 2 MWT by 100 cft with LRAD to demonstrate improved functional gait in community  Baseline: 75 ft with RW; 12/14/23: 334ft no AD Goal status: MET  6.   Patient will increase right leg MMT's to 5/5 to allow navigation of steps without gait deviation or loss of balance  Baseline: see above Goal status: met   PLAN:  PT FREQUENCY: 2x/week  PT DURATION: 6 weeks  PLANNED INTERVENTIONS: 97164- PT Re-evaluation, 97110-Therapeutic exercises, 97530- Therapeutic activity, 97112- Neuromuscular re-education, 97535- Self Care, 02859- Manual therapy, [id number]- Gait training, [phone]- Orthotic Fit/training, [phone]- Canalith repositioning, [id number]- Aquatic Therapy, 97760- Splinting, [phone]- Wound care (first 20 sq cm), 97598- Wound care (each additional 20 sq cm)Patient/Family education, Balance training, Stair training, Taping, Dry Needling, Joint mobilization, Joint manipulation, Spinal manipulation, Spinal mobilization, Scar mobilization, and DME instructions.   PLAN FOR NEXT SESSION:discharge 10:16 AM, 01/07/24 E[place]ria Manternach Small Leodis  MPT [place] [facility] [place] [phone]

## 2024-01-07 NOTE — Progress Notes (Signed)
 Post-Op Visit Note   Patient: Alisha Reed           Date of Birth: Jul 06, 1960           MRN: [id number] Visit Date: 01/07/2024 PCP: Norleen Lynwood ORN, MD   Assessment & Plan:  Chief Complaint:  Chief Complaint  Patient presents with   Right Knee - Routine Post Op   Visit Diagnoses:  1. Status post right partial knee replacement     Plan: Patient is a pleasant 63 year old female comes in today approximately 12 weeks status post right partial knee replacement, date of surgery 10/11/2023.  She is overall improving however she is still having a fair amount of pain at night on days she has been somewhat active.  She is typically taking NSAIDs during the day and tramadol  at night with the exception of an occasional half of an oxycodone  at night when she is in significant pain.  She has been in outpatient physical therapy and just finished today.  Examination of the right knee reveals a fully healed surgical scar without complication.  Range of motion 0 to 120 degrees.  Stable to valgus varus stress.  She is neurovascularly intact distally.  At this point, she will continue with her home exercise program.  She will gradually increase activity as tolerated as I think she may be overdoing it some days.  I refilled her tramadol  and have sent in a small prescription of Norco to take for breakthrough pain as needed at night.  We have provided her with a work note for light duty for the next 4 weeks.  She will let us  know if we need to extend this.  She will follow-up with us  in 3 months for repeat evaluation and 2 view x-rays of the right knee.  Call with concerns or questions.  Follow-Up Instructions: Return in about 3 months (around 04/08/2024).   Orders:  No orders of the defined types were placed in this encounter.  Meds ordered this encounter  Medications   traMADol  (ULTRAM ) 50 MG tablet    Sig: Take 1 tablet (50 mg total) by mouth 2 (two) times daily as needed.    Dispense:  30 tablet     Refill:  2   HYDROcodone -acetaminophen  (NORCO/VICODIN) 5-325 MG tablet    Sig: Take 1 tablet by mouth at bedtime as needed for moderate pain (pain score 4-6). For breakthrough pain    Dispense:  20 tablet    Refill:  0    Imaging: No results found.  PMFS History: Patient Active Problem List   Diagnosis Date Noted   Status post right partial knee replacement 10/11/2023   Primary osteoarthritis of right knee 06/29/2023   Right tennis elbow 06/29/2023   Pain in right elbow 01/26/2023   Coronary artery calcification seen on CT scan 01/09/2023   Allergic rhinitis 12/09/2022   Microhematuria 12/09/2022   Rectal bleeding 03/13/2022   Other constipation 03/13/2022   Arthritis 12/03/2021   Stress incontinence 11/19/2020   S/p bilateral carpal tunnel release 03/28/2019   Chronic pain of right knee 12/29/2018   Right carpal tunnel syndrome 12/29/2018   Left carpal tunnel syndrome 12/29/2018   Osteopenia 11/08/2018   Pre-diabetes 05/02/2018   Acute pain of right shoulder 03/18/2018   Hyperlipidemia 11/04/2017   Vitamin D  deficiency 11/04/2017   S/P cholecystectomy 08/02/2017   Skier's thumb, right, subsequent encounter 03/15/2017   Chronic left shoulder pain 02/08/2017   Encounter for well adult exam with abnormal  findings 09/24/2016   Hypothyroidism 09/24/2016   Past Medical History:  Diagnosis Date   Allergy seasonal   Arthritis    hands,    Carpal tunnel syndrome on both sides    both hands surgeries 01/2019   GERD (gastroesophageal reflux disease)    Hypercholesterolemia    Hypothyroidism    Osteopenia 11/08/2018   Thyroid  disease     Family History  Problem Relation Age of Onset   Scleroderma Mother    Rheum arthritis Mother    Heart disease Mother    Hyperlipidemia Mother    Hypertension Mother    Varicose Veins Mother    Heart disease Father    Glaucoma Father    COPD Father    Hyperlipidemia Father    Hypertension Father    Stomach cancer Maternal  Grandmother    Heart attack Maternal Grandfather    Alzheimer's disease Paternal Grandmother    Throat cancer Paternal Grandfather    Colon cancer Neg Hx    Liver cancer Neg Hx    Colon polyps Neg Hx    Esophageal cancer Neg Hx    Rectal cancer Neg Hx     Past Surgical History:  Procedure Laterality Date   ABDOMINAL HYSTERECTOMY     BILATERAL CARPAL TUNNEL RELEASE Bilateral 01/18/2019   Procedure: BILATERAL CARPAL TUNNEL RELEASE;  Surgeon: Jerri Kay HERO, MD;  Location: [facility];  Service: Orthopedics;  Laterality: Bilateral;   BLADDER SUSPENSION N/A 08/10/2022   Procedure: TRANSVAGINAL TAPE (TVT) PROCEDURE;  Surgeon: Marilynne Rosaline SAILOR, MD;  Location: [facility];  Service: Gynecology;  Laterality: N/A;  Total time requested is 1 hour.   CHOLECYSTECTOMY     COLONOSCOPY  2011   CYSTOSCOPY N/A 08/10/2022   Procedure: CYSTOSCOPY;  Surgeon: Marilynne Rosaline SAILOR, MD;  Location: [facility];  Service: Gynecology;  Laterality: N/A;   FOOT SURGERY Bilateral    HYSTERECTOMY ABDOMINAL WITH SALPINGECTOMY     JOINT REPLACEMENT  10/11/2023   NASAL SINUS SURGERY     PARTIAL KNEE ARTHROPLASTY Right 10/11/2023   Procedure: ARTHROPLASTY, KNEE, UNICOMPARTMENTAL;  Surgeon: Jerri Kay HERO, MD;  Location: [facility];  Service: Orthopedics;  Laterality: Right;  RIGHT KNEE MEDIAL UKA vs TKA   TUBAL LIGATION     Social History   Occupational History   Occupation: [employer] fundraiser  Tobacco Use   Smoking status: Never   Smokeless tobacco: Never  Vaping Use   Vaping status: Never Used  Substance and Sexual Activity   Alcohol use: Yes    Alcohol/week: 1.0 standard drink of alcohol    Types: 1 Shots of liquor per week    Comment: occasional   Drug use: No   Sexual activity: Yes    Birth control/protection: Surgical

## 2024-01-11 ENCOUNTER — Encounter: Payer: Self-pay | Admitting: Orthopaedic Surgery

## 2024-01-18 ENCOUNTER — Encounter: Payer: Self-pay | Admitting: Orthopaedic Surgery

## 2024-01-21 ENCOUNTER — Other Ambulatory Visit: Payer: Self-pay | Admitting: Internal Medicine

## 2024-01-24 ENCOUNTER — Other Ambulatory Visit (HOSPITAL_COMMUNITY): Payer: Self-pay

## 2024-01-24 MED ORDER — ROSUVASTATIN CALCIUM 40 MG PO TABS
40.0000 mg | ORAL_TABLET | Freq: Every day | ORAL | 3 refills | Status: AC
  Filled 2024-01-24: qty 90, 90d supply, fill #0
  Filled 2024-02-04 (×2): qty 90, 90d supply, fill #1

## 2024-01-25 ENCOUNTER — Encounter: Payer: Self-pay | Admitting: Orthopaedic Surgery

## 2024-01-25 NOTE — Telephone Encounter (Signed)
Spoke to her today

## 2024-02-02 ENCOUNTER — Encounter: Payer: Self-pay | Admitting: Orthopaedic Surgery

## 2024-02-03 NOTE — Telephone Encounter (Signed)
 Ok to do

## 2024-02-04 ENCOUNTER — Other Ambulatory Visit: Payer: Self-pay

## 2024-02-04 ENCOUNTER — Other Ambulatory Visit (HOSPITAL_COMMUNITY): Payer: Self-pay

## 2024-02-06 ENCOUNTER — Encounter: Payer: Self-pay | Admitting: Orthopaedic Surgery

## 2024-02-07 ENCOUNTER — Other Ambulatory Visit (HOSPITAL_COMMUNITY): Payer: Self-pay

## 2024-02-07 ENCOUNTER — Other Ambulatory Visit: Payer: Self-pay

## 2024-02-07 MED ORDER — AMOXICILLIN 500 MG PO CAPS
2000.0000 mg | ORAL_CAPSULE | Freq: Once | ORAL | 2 refills | Status: AC
  Filled 2024-02-07: qty 4, 1d supply, fill #0

## 2024-02-09 ENCOUNTER — Encounter: Payer: Self-pay | Admitting: Orthopaedic Surgery

## 2024-02-10 ENCOUNTER — Other Ambulatory Visit: Payer: Self-pay | Admitting: Physician Assistant

## 2024-02-10 ENCOUNTER — Other Ambulatory Visit (HOSPITAL_COMMUNITY): Payer: Self-pay

## 2024-02-10 MED ORDER — CELECOXIB 100 MG PO CAPS
100.0000 mg | ORAL_CAPSULE | Freq: Two times a day (BID) | ORAL | 3 refills | Status: DC | PRN
  Filled 2024-02-10: qty 60, 30d supply, fill #0
  Filled 2024-03-07: qty 60, 30d supply, fill #1
  Filled 2024-04-20: qty 60, 30d supply, fill #2

## 2024-02-10 NOTE — Telephone Encounter (Signed)
 Sent to pharmacy on file

## 2024-02-23 ENCOUNTER — Encounter: Payer: Self-pay | Admitting: Orthopaedic Surgery

## 2024-02-24 ENCOUNTER — Other Ambulatory Visit: Payer: Self-pay | Admitting: Physician Assistant

## 2024-02-24 ENCOUNTER — Other Ambulatory Visit (HOSPITAL_COMMUNITY): Payer: Self-pay

## 2024-02-24 DIAGNOSIS — Z96651 Presence of right artificial knee joint: Secondary | ICD-10-CM

## 2024-02-24 MED ORDER — TRAMADOL HCL 50 MG PO TABS
50.0000 mg | ORAL_TABLET | Freq: Two times a day (BID) | ORAL | 2 refills | Status: DC | PRN
  Filled 2024-02-24: qty 30, 15d supply, fill #0
  Filled 2024-03-07: qty 30, 15d supply, fill #1
  Filled 2024-04-20: qty 30, 15d supply, fill #2

## 2024-02-24 NOTE — Telephone Encounter (Signed)
 Sent to pharmacy on file

## 2024-03-08 ENCOUNTER — Other Ambulatory Visit (HOSPITAL_COMMUNITY): Payer: Self-pay

## 2024-03-08 ENCOUNTER — Other Ambulatory Visit: Payer: Self-pay

## 2024-03-13 ENCOUNTER — Encounter: Payer: Self-pay | Admitting: Radiology

## 2024-03-17 DIAGNOSIS — Z85828 Personal history of other malignant neoplasm of skin: Secondary | ICD-10-CM | POA: Diagnosis not present

## 2024-03-17 DIAGNOSIS — L578 Other skin changes due to chronic exposure to nonionizing radiation: Secondary | ICD-10-CM | POA: Diagnosis not present

## 2024-03-17 DIAGNOSIS — D2372 Other benign neoplasm of skin of left lower limb, including hip: Secondary | ICD-10-CM | POA: Diagnosis not present

## 2024-03-17 DIAGNOSIS — D2371 Other benign neoplasm of skin of right lower limb, including hip: Secondary | ICD-10-CM | POA: Diagnosis not present

## 2024-03-17 DIAGNOSIS — L821 Other seborrheic keratosis: Secondary | ICD-10-CM | POA: Diagnosis not present

## 2024-03-17 DIAGNOSIS — D225 Melanocytic nevi of trunk: Secondary | ICD-10-CM | POA: Diagnosis not present

## 2024-04-11 ENCOUNTER — Ambulatory Visit (INDEPENDENT_AMBULATORY_CARE_PROVIDER_SITE_OTHER): Payer: Self-pay

## 2024-04-11 ENCOUNTER — Ambulatory Visit: Admitting: Physician Assistant

## 2024-04-11 DIAGNOSIS — Z96651 Presence of right artificial knee joint: Secondary | ICD-10-CM

## 2024-04-11 NOTE — Progress Notes (Signed)
 Post-Op Visit Note   Patient: Alisha Reed           Date of Birth: Jun 12, 1960           MRN: [id number] Visit Date: 04/11/2024 PCP: Norleen Lynwood ORN, MD   Assessment & Plan:  Chief Complaint:  Chief Complaint  Patient presents with   Right Knee - Follow-up    Partial knee replacement 10/11/2023   Visit Diagnoses:  1. Status post right partial knee replacement     Plan: Patient is a pleasant 63 year old female who comes in today 6 months status post right medial compartment knee replacement 10/11/2023.  She is still having some pain and stiffness primarily in the morning, but overall improved over the past few months.  She takes occasional tramadol  for pain.  She has finished physical therapy but continues to work on a home exercise program.  Examination of the right knee reveals range of motion from 5 to 115 degrees.  She is neurovascularly intact distally.  At this point, she will continue to work on her home exercise program.  She will follow-up in 6 months for repeat evaluation and 2 view x-rays of the right knee.  Call with concerns or questions.  Follow-Up Instructions: Return in about 6 months (around 10/10/2024).   Orders:  Orders Placed This Encounter  Procedures   XR Knee 1-2 Views Right   No orders of the defined types were placed in this encounter.   Imaging: XR Knee 1-2 Views Right Result Date: 04/11/2024 Well-seated prosthesis without complication   PMFS History: Patient Active Problem List   Diagnosis Date Noted   Status post right partial knee replacement 10/11/2023   Primary osteoarthritis of right knee 06/29/2023   Right tennis elbow 06/29/2023   Pain in right elbow 01/26/2023   Coronary artery calcification seen on CT scan 01/09/2023   Allergic rhinitis 12/09/2022   Microhematuria 12/09/2022   Rectal bleeding 03/13/2022   Other constipation 03/13/2022   Arthritis 12/03/2021   Stress incontinence 11/19/2020   S/p bilateral carpal tunnel release  03/28/2019   Chronic pain of right knee 12/29/2018   Right carpal tunnel syndrome 12/29/2018   Left carpal tunnel syndrome 12/29/2018   Osteopenia 11/08/2018   Pre-diabetes 05/02/2018   Acute pain of right shoulder 03/18/2018   Hyperlipidemia 11/04/2017   Vitamin D  deficiency 11/04/2017   S/P cholecystectomy 08/02/2017   Skier's thumb, right, subsequent encounter 03/15/2017   Chronic left shoulder pain 02/08/2017   Encounter for well adult exam with abnormal findings 09/24/2016   Hypothyroidism 09/24/2016   Past Medical History:  Diagnosis Date   Allergy seasonal   Arthritis    hands,    Carpal tunnel syndrome on both sides    both hands surgeries 01/2019   GERD (gastroesophageal reflux disease)    Hypercholesterolemia    Hypothyroidism    Osteopenia 11/08/2018   Thyroid  disease     Family History  Problem Relation Age of Onset   Scleroderma Mother    Rheum arthritis Mother    Heart disease Mother    Hyperlipidemia Mother    Hypertension Mother    Varicose Veins Mother    Heart disease Father    Glaucoma Father    COPD Father    Hyperlipidemia Father    Hypertension Father    Stomach cancer Maternal Grandmother    Heart attack Maternal Grandfather    Alzheimer's disease Paternal Grandmother    Throat cancer Paternal Grandfather    Colon cancer  Neg Hx    Liver cancer Neg Hx    Colon polyps Neg Hx    Esophageal cancer Neg Hx    Rectal cancer Neg Hx     Past Surgical History:  Procedure Laterality Date   ABDOMINAL HYSTERECTOMY     BILATERAL CARPAL TUNNEL RELEASE Bilateral 01/18/2019   Procedure: BILATERAL CARPAL TUNNEL RELEASE;  Surgeon: Jerri Kay HERO, MD;  Location: [facility];  Service: Orthopedics;  Laterality: Bilateral;   BLADDER SUSPENSION N/A 08/10/2022   Procedure: TRANSVAGINAL TAPE (TVT) PROCEDURE;  Surgeon: Marilynne Rosaline SAILOR, MD;  Location: [facility];  Service: Gynecology;  Laterality: N/A;  Total time requested is 1  hour.   CHOLECYSTECTOMY     COLONOSCOPY  2011   CYSTOSCOPY N/A 08/10/2022   Procedure: CYSTOSCOPY;  Surgeon: Marilynne Rosaline SAILOR, MD;  Location: [facility];  Service: Gynecology;  Laterality: N/A;   FOOT SURGERY Bilateral    HYSTERECTOMY ABDOMINAL WITH SALPINGECTOMY     JOINT REPLACEMENT  10/11/2023   NASAL SINUS SURGERY     PARTIAL KNEE ARTHROPLASTY Right 10/11/2023   Procedure: ARTHROPLASTY, KNEE, UNICOMPARTMENTAL;  Surgeon: Jerri Kay HERO, MD;  Location: [facility];  Service: Orthopedics;  Laterality: Right;  RIGHT KNEE MEDIAL UKA vs TKA   TUBAL LIGATION     Social History   Occupational History   Occupation: [employer]  Tobacco Use   Smoking status: Never   Smokeless tobacco: Never  Vaping Use   Vaping status: Never Used  Substance and Sexual Activity   Alcohol use: Yes    Alcohol/week: 1.0 standard drink of alcohol    Types: 1 Shots of liquor per week    Comment: occasional   Drug use: No   Sexual activity: Yes    Birth control/protection: Surgical

## 2024-04-20 ENCOUNTER — Other Ambulatory Visit (HOSPITAL_COMMUNITY): Payer: Self-pay

## 2024-05-15 ENCOUNTER — Other Ambulatory Visit (HOSPITAL_COMMUNITY): Payer: Self-pay

## 2024-05-22 ENCOUNTER — Encounter: Payer: Self-pay | Admitting: *Deleted

## 2024-05-26 ENCOUNTER — Encounter: Payer: Self-pay | Admitting: Orthopaedic Surgery

## 2024-05-28 NOTE — Telephone Encounter (Signed)
 Please mail Alisha Reed a copy of tennis elbow exercises.

## 2024-05-29 ENCOUNTER — Other Ambulatory Visit: Payer: Self-pay | Admitting: Physician Assistant

## 2024-05-29 DIAGNOSIS — Z96651 Presence of right artificial knee joint: Secondary | ICD-10-CM

## 2024-05-29 MED ORDER — TRAMADOL HCL 50 MG PO TABS: 50.0000 mg | ORAL_TABLET | Freq: Two times a day (BID) | ORAL | 2 refills | Status: AC | PRN

## 2024-05-29 NOTE — Telephone Encounter (Signed)
 sent

## 2024-05-29 NOTE — Telephone Encounter (Signed)
 Will mail out excersies.

## 2024-05-29 NOTE — Telephone Encounter (Signed)
 See message about PA

## 2024-06-08 ENCOUNTER — Other Ambulatory Visit: Payer: Self-pay | Admitting: Physician Assistant

## 2024-06-08 MED ORDER — CELECOXIB 100 MG PO CAPS: 100.0000 mg | ORAL_CAPSULE | Freq: Two times a day (BID) | ORAL | 3 refills | Status: AC | PRN

## 2024-12-11 ENCOUNTER — Encounter: Admitting: Internal Medicine
